# Patient Record
Sex: Male | Born: 1935 | Race: White | Hispanic: No | Marital: Married | Smoking: Never smoker
Health system: Southern US, Community
[De-identification: ages and names within clinical notes are randomized; demographics above are authoritative.]

## PROBLEM LIST (undated history)

## (undated) DIAGNOSIS — T7840XA Allergy, unspecified, initial encounter: Secondary | ICD-10-CM

## (undated) DIAGNOSIS — E785 Hyperlipidemia, unspecified: Secondary | ICD-10-CM

## (undated) DIAGNOSIS — T4145XA Adverse effect of unspecified anesthetic, initial encounter: Secondary | ICD-10-CM

## (undated) DIAGNOSIS — H9190 Unspecified hearing loss, unspecified ear: Secondary | ICD-10-CM

## (undated) DIAGNOSIS — R9431 Abnormal electrocardiogram [ECG] [EKG]: Secondary | ICD-10-CM

## (undated) DIAGNOSIS — K559 Vascular disorder of intestine, unspecified: Secondary | ICD-10-CM

## (undated) DIAGNOSIS — E663 Overweight: Secondary | ICD-10-CM

## (undated) DIAGNOSIS — K219 Gastro-esophageal reflux disease without esophagitis: Secondary | ICD-10-CM

## (undated) DIAGNOSIS — I10 Essential (primary) hypertension: Secondary | ICD-10-CM

## (undated) DIAGNOSIS — T8859XA Other complications of anesthesia, initial encounter: Secondary | ICD-10-CM

## (undated) DIAGNOSIS — K2270 Barrett's esophagus without dysplasia: Secondary | ICD-10-CM

## (undated) DIAGNOSIS — M1990 Unspecified osteoarthritis, unspecified site: Secondary | ICD-10-CM

## (undated) DIAGNOSIS — Z8719 Personal history of other diseases of the digestive system: Secondary | ICD-10-CM

## (undated) DIAGNOSIS — N400 Enlarged prostate without lower urinary tract symptoms: Secondary | ICD-10-CM

## (undated) DIAGNOSIS — G629 Polyneuropathy, unspecified: Secondary | ICD-10-CM

## (undated) HISTORY — PX: EYE SURGERY: SHX253

## (undated) HISTORY — DX: Essential (primary) hypertension: I10

## (undated) HISTORY — DX: Benign prostatic hyperplasia without lower urinary tract symptoms: N40.0

## (undated) HISTORY — PX: FOOT SURGERY: SHX648

## (undated) HISTORY — DX: Allergy, unspecified, initial encounter: T78.40XA

## (undated) HISTORY — PX: APPENDECTOMY: SHX54

## (undated) HISTORY — PX: SEPTOPLASTY: SUR1290

## (undated) HISTORY — PX: OTHER SURGICAL HISTORY: SHX169

## (undated) HISTORY — PX: TONSILLECTOMY: SUR1361

## (undated) HISTORY — PX: INNER EAR SURGERY: SHX679

## (undated) HISTORY — DX: Personal history of other diseases of the digestive system: Z87.19

## (undated) HISTORY — DX: Vascular disorder of intestine, unspecified: K55.9

## (undated) HISTORY — DX: Hyperlipidemia, unspecified: E78.5

## (undated) HISTORY — DX: Polyneuropathy, unspecified: G62.9

## (undated) HISTORY — DX: Unspecified osteoarthritis, unspecified site: M19.90

## (undated) HISTORY — DX: Overweight: E66.3

## (undated) HISTORY — DX: Barrett's esophagus without dysplasia: K22.70

## (undated) HISTORY — DX: Gastro-esophageal reflux disease without esophagitis: K21.9

---

## 1991-05-31 HISTORY — PX: LAPAROSCOPIC CHOLECYSTECTOMY: SUR755

## 2001-06-07 ENCOUNTER — Ambulatory Visit (HOSPITAL_BASED_OUTPATIENT_CLINIC_OR_DEPARTMENT_OTHER): Admission: RE | Admit: 2001-06-07 | Discharge: 2001-06-07 | Payer: Self-pay | Admitting: Orthopedic Surgery

## 2002-04-15 ENCOUNTER — Ambulatory Visit (HOSPITAL_COMMUNITY): Admission: RE | Admit: 2002-04-15 | Discharge: 2002-04-15 | Payer: Self-pay | Admitting: Pulmonary Disease

## 2002-04-15 ENCOUNTER — Encounter: Payer: Self-pay | Admitting: Pulmonary Disease

## 2003-03-15 ENCOUNTER — Emergency Department (HOSPITAL_COMMUNITY): Admission: EM | Admit: 2003-03-15 | Discharge: 2003-03-15 | Payer: Self-pay | Admitting: Emergency Medicine

## 2003-06-09 ENCOUNTER — Inpatient Hospital Stay (HOSPITAL_COMMUNITY): Admission: RE | Admit: 2003-06-09 | Discharge: 2003-06-14 | Payer: Self-pay | Admitting: Orthopedic Surgery

## 2003-07-22 ENCOUNTER — Inpatient Hospital Stay (HOSPITAL_COMMUNITY): Admission: RE | Admit: 2003-07-22 | Discharge: 2003-07-23 | Payer: Self-pay | Admitting: Neurosurgery

## 2003-11-11 ENCOUNTER — Encounter: Admission: RE | Admit: 2003-11-11 | Discharge: 2003-11-11 | Payer: Self-pay | Admitting: Neurosurgery

## 2004-04-15 ENCOUNTER — Ambulatory Visit (HOSPITAL_COMMUNITY): Admission: RE | Admit: 2004-04-15 | Discharge: 2004-04-15 | Payer: Self-pay | Admitting: Neurosurgery

## 2004-06-07 ENCOUNTER — Inpatient Hospital Stay (HOSPITAL_COMMUNITY): Admission: RE | Admit: 2004-06-07 | Discharge: 2004-06-08 | Payer: Self-pay | Admitting: Neurosurgery

## 2004-11-02 ENCOUNTER — Ambulatory Visit: Payer: Self-pay | Admitting: Gastroenterology

## 2004-11-17 ENCOUNTER — Encounter (INDEPENDENT_AMBULATORY_CARE_PROVIDER_SITE_OTHER): Payer: Self-pay | Admitting: *Deleted

## 2004-11-17 ENCOUNTER — Ambulatory Visit: Payer: Self-pay | Admitting: Gastroenterology

## 2004-12-01 ENCOUNTER — Ambulatory Visit: Payer: Self-pay | Admitting: Pulmonary Disease

## 2005-01-24 ENCOUNTER — Ambulatory Visit: Payer: Self-pay | Admitting: Pulmonary Disease

## 2005-02-03 ENCOUNTER — Ambulatory Visit: Payer: Self-pay

## 2005-04-27 ENCOUNTER — Ambulatory Visit: Payer: Self-pay | Admitting: Pulmonary Disease

## 2005-07-14 ENCOUNTER — Ambulatory Visit: Payer: Self-pay | Admitting: Pulmonary Disease

## 2006-01-13 ENCOUNTER — Ambulatory Visit: Payer: Self-pay | Admitting: Pulmonary Disease

## 2006-01-27 ENCOUNTER — Ambulatory Visit: Payer: Self-pay | Admitting: Pulmonary Disease

## 2006-03-28 ENCOUNTER — Ambulatory Visit: Payer: Self-pay | Admitting: Pulmonary Disease

## 2006-07-14 ENCOUNTER — Ambulatory Visit: Payer: Self-pay | Admitting: Pulmonary Disease

## 2006-07-26 ENCOUNTER — Inpatient Hospital Stay (HOSPITAL_COMMUNITY): Admission: RE | Admit: 2006-07-26 | Discharge: 2006-07-29 | Payer: Self-pay | Admitting: Orthopedic Surgery

## 2006-10-20 ENCOUNTER — Ambulatory Visit: Payer: Self-pay | Admitting: Gastroenterology

## 2006-10-29 ENCOUNTER — Inpatient Hospital Stay (HOSPITAL_COMMUNITY): Admission: EM | Admit: 2006-10-29 | Discharge: 2006-11-01 | Payer: Self-pay | Admitting: Emergency Medicine

## 2006-10-29 ENCOUNTER — Ambulatory Visit: Payer: Self-pay | Admitting: Internal Medicine

## 2006-11-01 ENCOUNTER — Ambulatory Visit: Payer: Self-pay | Admitting: Internal Medicine

## 2006-11-21 ENCOUNTER — Ambulatory Visit: Payer: Self-pay | Admitting: Gastroenterology

## 2006-11-28 LAB — CONVERTED CEMR LAB
Basophils Absolute: 0.1 10*3/uL (ref 0.0–0.1)
Basophils Relative: 0.7 % (ref 0.0–1.0)
HCT: 38.7 % — ABNORMAL LOW (ref 39.0–52.0)
Hemoglobin: 13.3 g/dL (ref 13.0–17.0)
Iron: 65 ug/dL (ref 42–165)
MCHC: 34.4 g/dL (ref 30.0–36.0)
MCV: 83.8 fL (ref 78.0–100.0)
Monocytes Relative: 7.7 % (ref 3.0–11.0)
Neutro Abs: 5.3 10*3/uL (ref 1.4–7.7)
Platelets: 196 10*3/uL (ref 150–400)
RDW: 13.7 % (ref 11.5–14.6)
Transferrin: 236.8 mg/dL (ref >=212.0)
WBC: 8.6 10*3/uL (ref 4.5–10.5)

## 2006-12-21 ENCOUNTER — Encounter: Payer: Self-pay | Admitting: Internal Medicine

## 2006-12-21 ENCOUNTER — Ambulatory Visit: Payer: Self-pay | Admitting: Internal Medicine

## 2007-01-11 ENCOUNTER — Ambulatory Visit: Payer: Self-pay | Admitting: Pulmonary Disease

## 2007-03-27 ENCOUNTER — Ambulatory Visit: Payer: Self-pay | Admitting: Pulmonary Disease

## 2007-05-07 DIAGNOSIS — F411 Generalized anxiety disorder: Secondary | ICD-10-CM | POA: Insufficient documentation

## 2007-05-07 DIAGNOSIS — K2270 Barrett's esophagus without dysplasia: Secondary | ICD-10-CM | POA: Insufficient documentation

## 2007-05-07 DIAGNOSIS — K219 Gastro-esophageal reflux disease without esophagitis: Secondary | ICD-10-CM | POA: Insufficient documentation

## 2007-05-07 DIAGNOSIS — G589 Mononeuropathy, unspecified: Secondary | ICD-10-CM | POA: Insufficient documentation

## 2007-05-07 DIAGNOSIS — J309 Allergic rhinitis, unspecified: Secondary | ICD-10-CM | POA: Insufficient documentation

## 2007-05-07 DIAGNOSIS — I10 Essential (primary) hypertension: Secondary | ICD-10-CM | POA: Insufficient documentation

## 2007-05-07 DIAGNOSIS — E785 Hyperlipidemia, unspecified: Secondary | ICD-10-CM | POA: Insufficient documentation

## 2007-05-07 DIAGNOSIS — M1990 Unspecified osteoarthritis, unspecified site: Secondary | ICD-10-CM | POA: Insufficient documentation

## 2007-05-08 ENCOUNTER — Ambulatory Visit: Payer: Self-pay | Admitting: Pulmonary Disease

## 2007-05-08 ENCOUNTER — Telehealth (INDEPENDENT_AMBULATORY_CARE_PROVIDER_SITE_OTHER): Payer: Self-pay | Admitting: *Deleted

## 2007-07-01 HISTORY — PX: OTHER SURGICAL HISTORY: SHX169

## 2007-07-12 ENCOUNTER — Ambulatory Visit: Payer: Self-pay | Admitting: Pulmonary Disease

## 2007-07-12 DIAGNOSIS — K559 Vascular disorder of intestine, unspecified: Secondary | ICD-10-CM | POA: Insufficient documentation

## 2007-07-14 DIAGNOSIS — N400 Enlarged prostate without lower urinary tract symptoms: Secondary | ICD-10-CM | POA: Insufficient documentation

## 2007-07-17 ENCOUNTER — Encounter: Admission: RE | Admit: 2007-07-17 | Discharge: 2007-07-17 | Payer: Self-pay | Admitting: Otolaryngology

## 2007-09-25 DIAGNOSIS — K449 Diaphragmatic hernia without obstruction or gangrene: Secondary | ICD-10-CM | POA: Insufficient documentation

## 2007-10-26 ENCOUNTER — Telehealth: Payer: Self-pay | Admitting: Pulmonary Disease

## 2007-11-09 ENCOUNTER — Encounter: Payer: Self-pay | Admitting: Pulmonary Disease

## 2008-01-04 ENCOUNTER — Ambulatory Visit: Payer: Self-pay | Admitting: Pulmonary Disease

## 2008-01-05 LAB — CONVERTED CEMR LAB
ALT: 27 units/L (ref 0–53)
AST: 25 units/L (ref 0–37)
BUN: 11 mg/dL (ref 6–23)
Basophils Relative: 0.4 % (ref 0.0–3.0)
CO2: 29 meq/L (ref 19–32)
Calcium: 9 mg/dL (ref 8.4–10.5)
Chloride: 111 meq/L (ref 96–112)
Cholesterol: 179 mg/dL (ref 0–200)
Creatinine, Ser: 1 mg/dL (ref 0.4–1.5)
Eosinophils Relative: 4.5 % (ref 0.0–5.0)
Glucose, Bld: 103 mg/dL — ABNORMAL HIGH (ref 70–99)
Hemoglobin: 14.6 g/dL (ref 13.0–17.0)
LDL Cholesterol: 122 mg/dL — ABNORMAL HIGH (ref 0–99)
Lymphocytes Relative: 33.4 % (ref 12.0–46.0)
MCHC: 34.3 g/dL (ref 30.0–36.0)
Monocytes Relative: 7.8 % (ref 3.0–12.0)
Neutro Abs: 3.6 10*3/uL (ref 1.4–7.7)
PSA: 2.04 ng/mL (ref 0.10–4.00)
RBC: 4.84 M/uL (ref 4.22–5.81)
TSH: 1.92 microintl units/mL (ref 0.35–5.50)
Total CHOL/HDL Ratio: 4.7
Total Protein: 6.9 g/dL (ref 6.0–8.3)
WBC: 6.6 10*3/uL (ref 4.5–10.5)

## 2008-02-27 ENCOUNTER — Ambulatory Visit: Payer: Self-pay | Admitting: Pulmonary Disease

## 2008-07-03 ENCOUNTER — Ambulatory Visit: Payer: Self-pay | Admitting: Pulmonary Disease

## 2008-07-05 DIAGNOSIS — E663 Overweight: Secondary | ICD-10-CM | POA: Insufficient documentation

## 2008-11-11 ENCOUNTER — Telehealth (INDEPENDENT_AMBULATORY_CARE_PROVIDER_SITE_OTHER): Payer: Self-pay | Admitting: *Deleted

## 2008-11-11 ENCOUNTER — Ambulatory Visit: Payer: Self-pay | Admitting: Internal Medicine

## 2008-11-18 DIAGNOSIS — L255 Unspecified contact dermatitis due to plants, except food: Secondary | ICD-10-CM | POA: Insufficient documentation

## 2008-12-11 ENCOUNTER — Encounter (INDEPENDENT_AMBULATORY_CARE_PROVIDER_SITE_OTHER): Payer: Self-pay | Admitting: *Deleted

## 2008-12-16 ENCOUNTER — Ambulatory Visit: Payer: Self-pay | Admitting: Gastroenterology

## 2008-12-30 ENCOUNTER — Encounter: Payer: Self-pay | Admitting: Gastroenterology

## 2008-12-30 ENCOUNTER — Ambulatory Visit: Payer: Self-pay | Admitting: Gastroenterology

## 2009-01-01 ENCOUNTER — Encounter: Payer: Self-pay | Admitting: Gastroenterology

## 2009-01-01 ENCOUNTER — Ambulatory Visit: Payer: Self-pay | Admitting: Pulmonary Disease

## 2009-01-07 ENCOUNTER — Ambulatory Visit: Payer: Self-pay | Admitting: Pulmonary Disease

## 2009-01-22 LAB — CONVERTED CEMR LAB
AST: 25 units/L (ref 0–37)
Albumin: 4.2 g/dL (ref 3.5–5.2)
Alkaline Phosphatase: 57 units/L (ref 39–117)
Basophils Absolute: 0.1 10*3/uL (ref 0.0–0.1)
Bilirubin, Direct: 0.1 mg/dL (ref 0.0–0.3)
CO2: 28 meq/L (ref 19–32)
Calcium: 9 mg/dL (ref 8.4–10.5)
Eosinophils Absolute: 0.4 10*3/uL (ref 0.0–0.7)
GFR calc non Af Amer: 78.12 mL/min (ref >60)
Glucose, Bld: 97 mg/dL (ref 70–99)
HCT: 42.4 % (ref 39.0–52.0)
Hemoglobin: 14.6 g/dL (ref 13.0–17.0)
Lymphs Abs: 2.6 10*3/uL (ref 0.7–4.0)
MCHC: 34.6 g/dL (ref 30.0–36.0)
Monocytes Absolute: 0.6 10*3/uL (ref 0.1–1.0)
Monocytes Relative: 8.1 % (ref 3.0–12.0)
Neutro Abs: 4.1 10*3/uL (ref 1.4–7.7)
Platelets: 177 10*3/uL (ref 150.0–400.0)
Potassium: 4.1 meq/L (ref 3.5–5.1)
RDW: 13.1 % (ref 11.5–14.6)
Sodium: 142 meq/L (ref 135–145)
TSH: 2.3 microintl units/mL (ref 0.35–5.50)
Total CHOL/HDL Ratio: 5
Total Protein: 7 g/dL (ref 6.0–8.3)

## 2009-03-13 ENCOUNTER — Ambulatory Visit: Payer: Self-pay | Admitting: Pulmonary Disease

## 2009-06-22 ENCOUNTER — Ambulatory Visit: Payer: Self-pay | Admitting: Pulmonary Disease

## 2009-11-27 ENCOUNTER — Ambulatory Visit: Payer: Self-pay | Admitting: Pulmonary Disease

## 2009-11-28 LAB — CONVERTED CEMR LAB
AST: 22 units/L (ref 0–37)
Albumin: 4.5 g/dL (ref 3.5–5.2)
BUN: 17 mg/dL (ref 6–23)
Basophils Relative: 0.6 % (ref 0.0–3.0)
Calcium: 9 mg/dL (ref 8.4–10.5)
Cholesterol: 198 mg/dL (ref 0–200)
Creatinine, Ser: 0.9 mg/dL (ref 0.4–1.5)
Eosinophils Relative: 3.9 % (ref 0.0–5.0)
GFR calc non Af Amer: 84.74 mL/min (ref >60)
Glucose, Bld: 88 mg/dL (ref 70–99)
HCT: 42.4 % (ref 39.0–52.0)
Hemoglobin: 14.7 g/dL (ref 13.0–17.0)
LDL Cholesterol: 125 mg/dL — ABNORMAL HIGH (ref 0–99)
Lymphs Abs: 2.7 10*3/uL (ref 0.7–4.0)
MCV: 88.6 fL (ref 78.0–100.0)
Monocytes Relative: 8.5 % (ref 3.0–12.0)
PSA: 2.76 ng/mL (ref 0.10–4.00)
Platelets: 195 10*3/uL (ref 150.0–400.0)
RBC: 4.78 M/uL (ref 4.22–5.81)
Total Bilirubin: 0.7 mg/dL (ref 0.3–1.2)
Total CHOL/HDL Ratio: 5
VLDL: 31.8 mg/dL (ref 0.0–40.0)
WBC: 9.2 10*3/uL (ref 4.5–10.5)

## 2010-02-22 ENCOUNTER — Telehealth: Payer: Self-pay | Admitting: Pulmonary Disease

## 2010-02-22 ENCOUNTER — Encounter: Payer: Self-pay | Admitting: Pulmonary Disease

## 2010-02-23 ENCOUNTER — Telehealth: Payer: Self-pay | Admitting: Pulmonary Disease

## 2010-02-23 ENCOUNTER — Encounter: Payer: Self-pay | Admitting: Pulmonary Disease

## 2010-03-01 ENCOUNTER — Telehealth (INDEPENDENT_AMBULATORY_CARE_PROVIDER_SITE_OTHER): Payer: Self-pay | Admitting: *Deleted

## 2010-03-16 ENCOUNTER — Encounter: Payer: Self-pay | Admitting: Pulmonary Disease

## 2010-05-26 ENCOUNTER — Ambulatory Visit: Payer: Self-pay | Admitting: Pulmonary Disease

## 2010-05-30 HISTORY — PX: OTHER SURGICAL HISTORY: SHX169

## 2010-07-01 NOTE — Progress Notes (Signed)
Summary: cough-----rx for Tussionex  Phone Note Call from Patient Call back at Home Phone [phone]   Caller: Spouse//clara Call For: nadel Summary of Call: States pt has a hacking cough, would like something called in.//[facility] Initial call taken by: Darletta Moll,  March 01, 2010 4:20 PM  Follow-up for Phone Call        Called, spoke with pt.  He c/o "hacking cough" x 3 wks.  Cough is mainly when he gets hot.  Using cough drops with slight relief.  Requesting rx for tussinex cough syrup -- states this has worked well in the past.   [facility] Allergies: Flomax Dr. Kriste Basque, pls advise.  Thanks! Gweneth Dimitri RN  March 01, 2010 4:36 PM   Additional Follow-up for Phone Call Additional follow up Details #1::        lmomtcb for pt---tussionex has already been called into the pharmacy ---just wanted to let the pt know Randell Loop [facility]  March 01, 2010 5:24 PM     Additional Follow-up for Phone Call Additional follow up Details #2::    called and spoke with pt.  pt aware rx sent to pharmacy. Marland KitchenArman Filter LPN  March 02, 2010 9:08 AM   New/Updated Medications: Sandria Senter ER 10-8 MG/5ML LQCR (HYDROCOD POLST-CHLORPHEN POLST) take one teaspoon by mouth every 12 hours as needed for cough Prescriptions: TUSSIONEX PENNKINETIC ER 10-8 MG/5ML LQCR (HYDROCOD POLST-CHLORPHEN POLST) take one teaspoon by mouth every 12 hours as needed for cough  #4 oz x 0   Entered by:   Randell Loop CMA   Authorized by:   Michele Mcalpine MD   Signed by:   Arman Filter LPN on 09/81/1914   Method used:   Telephoned to ...       [facility] [address] (retail)       [address]       [place], [place]  [address]       Ph: [phone]       Fax: [phone]   RxID:   [id number]

## 2010-07-01 NOTE — Op Note (Signed)
Summary: Right Foot / [facility]  Right Foot / [facility]   Imported By: Lennie Odor 03/25/2010 12:07:18  _____________________________________________________________________  External Attachment:    Type:   Image     Comment:   External Document

## 2010-07-01 NOTE — Assessment & Plan Note (Signed)
Summary: 6 months/acp   CC:  6 month ROV & review of mult medical problems....  History of Present Illness: 76 y/o WM here for a follow up visit...  he has multiple medical problems as noted below...   Followed for general medical purposes w/ hx HBP, Hyperlipidemia, Barrett's esoph, ischemic colitis in 2008, BPH, and DJD...   ~  June 22, 2009:  he had a good 50mo- just c/o some burning pain in left groin area... ?loose ing ring/ ing hernia but not bad enough for surg he says- try Lyrica 50-100 Qhs first... needs refill Rx for 2011 & due for TDAP today.   ~  November 27, 2009:  he's been doing well x left foot> surg x2 by Podiatrist for bunion, & toe shortening... we discussed poss 2nd opinion w/ Orthopedist- DrBednarz, he will decide... otherw feeling well- no new complaints or concerns...    Current Problem List:  ALLERGIC RHINITIS (ICD-477.9) - DrESL referred pt to [facility] for nasal polyps... on PATANASE & allergy shots weekly...  HYPERTENSION (ICD-401.9) - controlled on ASA 81mg /d, & DOXAZOSIN 8mg /d... BP=112/84 and he feels well... doesn't check BP at home but it's OK at the drug store... denies HA, fatigue, visual changes, CP, palipit, dizziness, syncope, dyspnea, edema, etc...   ~  he had a negative Cardiolite in 2001 and normal CDoppler's in 2006.  HYPERLIPIDEMIA (ICD-272.4) - on diet alone... he has repeatedly refused Statin Rx.  ~  FLP 8/07 showed TChol 189, TG 200, HDL 32, LDL 117... rec- med Rx, but he preferred diet.  ~  FLP 8/09 showed TChol 179, TG 94, HDL 38, LDL 122... rec- consider meds, he refuses.  ~  FLP 8/10 showed TChol 186, TG 140, HDL 37, LDL 121... better diet, get wt down.  ~  FLP 7/11 showed TChol 198, TG 159, HDL 41, LDL 125... he continues to decline statin Rx.  OVERWEIGHT (ICD-278.02) - we discussed diet + exercise program sufficient to lose weight.  ~  weight 2/10 = 239#   ~  weight 8/10 = 244#  ~  weight 1/11 = 238#  ~  weight 7/11 = 240#  GERD  (ICD-530.81) & BARRETTS ESOPHAGUS (ICD-530.85) - followed by DrPatterson/Brodie... on NEXIUM 40mg /d...  ~  EGD 7/08 showed HH and Barrett's mucosa (no dysplasia)...  ~  EGD 8/10 showed sm hyperplastic polyps, & 5cm segment of Barrett's mucosa... f/u planned 34yrs.  Hx of ISCHEMIC COLITIS (ICD-557.9) - Hosp 5/08 w/ abd pain & lower GIB.Marland Kitchen. eval revealed prob ischemic colitis... he had a f/u colonoscopy 7/08 which was normal without colitis... no recurrent symptoms to date...  BENIGN PROSTATIC HYPERTROPHY, HX OF (ICD-V13.8) - hx BPH and UTI in the past... DOXAZOSIN helps... INTOL Flomax "it affects my eyes"; added UROXATROL 10mg /d 8/10 and improved nocturia & LTOS...  ~  labs 8/07 showed PSA= 2.6  ~  labs 8/09 showed PSA= 2.04  ~  labs 8/10 showed PSA= 2.54  ~  labs 7/11 showed PSA= 2.76  DEGENERATIVE JOINT DISEASE (ICD-715.90) - S/P Right TKR 2/08 & Left TKR 2/09 DrAlusio... & 2 prev neck surg by DrHirsh... he notes occas LBP as well...  ~  7/11: he reports 2 operations left foot by podiatrist for bunion & toe straightening.  NEUROPATHY (ICD-355.9) - prev tried Lyrica- ?benefit...  ANXIETY (ICD-300.00) - he is under mod stress and requests ALPRAZOLAM 0.5mg - 1/2 to 1 tab Tid Prn...  Health Maintenance:  had 2010 Flu vaccine 10/10... given TDAP 1/11.Marland KitchenMarland Kitchen  Preventive Screening-Counseling & Management  Alcohol-Tobacco     Smoking Status: never  Allergies: 1)  ! Flomax (Tamsulosin Hcl)  Comments:  Nurse/Medical Assistant: The patient's medications and allergies were reviewed with the patient and were updated in the Medication and Allergy Lists.  Past History:  Past Medical History: ALLERGIC RHINITIS (ICD-477.9) HYPERTENSION (ICD-401.9) HYPERLIPIDEMIA (ICD-272.4) OVERWEIGHT (ICD-278.02) GERD (ICD-530.81) BARRETTS ESOPHAGUS (ICD-530.85) Hx of ISCHEMIC COLITIS (ICD-557.9) BENIGN PROSTATIC HYPERTROPHY, HX OF (ICD-V13.8) DEGENERATIVE JOINT DISEASE (ICD-715.90) NEUROPATHY  (ICD-355.9) ANXIETY (ICD-300.00)  Past Surgical History: S/P Lap Chole 1993 by DrPYoung S/P 2 Neck Surgeries by DrHirsh in 2005-6 w/ ant cx decompression, diskectomy, fusion, & C6-7 allograft S/P Right TKR 2/08 by DrAlusio S/P Left TKR 2/09 by DrAlusio S/P appendectomy  Family History: Reviewed history from 01/04/2008 and no changes required. mother deceased age 88 from leukemia father deceased age 39 from heart attack 1 sibling alive age 76  Social History: Reviewed history from 01/04/2008 and no changes required. Patient never smoked.  exposed to second hand smoke limited exercise drinks caffeine--2 cups per day married 1 child  Review of Systems      See HPI  The patient denies anorexia, fever, weight loss, weight gain, vision loss, decreased hearing, hoarseness, chest pain, syncope, dyspnea on exertion, peripheral edema, prolonged cough, headaches, hemoptysis, abdominal pain, melena, hematochezia, severe indigestion/heartburn, hematuria, incontinence, muscle weakness, suspicious skin lesions, transient blindness, difficulty walking, depression, unusual weight change, abnormal bleeding, enlarged lymph nodes, and angioedema.    Vital Signs:  Patient profile:   76 year old male Height:      71.5 inches Weight:      240 pounds BMI:     33.13 O2 Sat:      96 % on Room air Temp:     97.5 degrees F oral Pulse rate:   64 / minute BP sitting:   112 / 84  (left arm) Cuff size:   regular  Vitals Entered By: Randell Loop CMA (November 27, 2009 8:51 AM)  O2 Sat at Rest %:  96 O2 Flow:  Room air CC: 6 month ROV & review of mult medical problems... Is Patient Diabetic? No Pain Assessment Patient in pain? yes      Onset of pain  some in his right foot after surgery Comments meds updated today with pt   Physical Exam  Additional Exam:  WD, Overweight, 76 y/o WM in NAD... GENERAL:  Alert & oriented; pleasant & cooperative... HEENT:  /AT, EACs-clear, TMs-wnl, NOSE-clear,  THROAT-clear & wnl. NECK:  Supple w/ fairROM; no JVD; normal carotid impulses w/o bruits; no thyromegaly or nodules palpated; no lymphadenopathy. CHEST:  Clear to P & A; without wheezes/ rales/ or rhonchi. HEART:  Regular Rhythm; without murmurs/ rubs/ or gallops. ABDOMEN:  Soft & nontender, w/ diastasis recti, normal bowel sounds; no organomegaly or masses detected. Groin- ?sm hernia on right, min tender, otherw neg. EXT: without deformities, mod arthritic changes; no varicose veins/ venous insuffic/ or edema. NEURO:  CN's intact; no focal neuro deficits... DERM:  no lesions noted...    CXR  Procedure date:  11/27/2009  Findings:      CHEST - 2 VIEW Comparison: January 04, 2008   Findings: Mild cardiomegaly is stable.  The mediastinum and pulmonary vasculature are within normal limits.  Both lungs are clear.  There is ankylosing degenerative change of the axial spine and postsurgical change at the cervicothoracic junction.   IMPRESSION: Stable chest x-ray with no evidence of acute cardiac or pulmonary  process.   Read By:  Dorthula Nettles,  M.D.   MISC. Report  Procedure date:  11/27/2009  Findings:      BMP (METABOL)   Sodium                    143 mEq/L                   135-145   Potassium                 4.4 mEq/L                   3.5-5.1   Chloride                  108 mEq/L                   96-112   Carbon Dioxide            28 mEq/L                    19-32   Glucose                   88 mg/dL                    98-11   BUN                       17 mg/dL                    9-14   Creatinine                0.9 mg/dL                   7.8-2.9   Calcium                   9.0 mg/dL                   5.6-21.3   GFR                       84.74 mL/min                >60  Hepatic/Liver Function Panel (HEPATIC)   Total Bilirubin           0.7 mg/dL                   0.8-6.5   Direct Bilirubin          0.1 mg/dL                   7.8-4.6   Alkaline Phosphatase      59  U/L                      39-117   AST                       22 U/L                      0-37   ALT                       21 U/L  0-53   Total Protein             7.5 g/dL                    8.2-9.5   Albumin                   4.5 g/dL                    6.2-1.3  CBC Platelet w/Diff (CBCD)   White Cell Count          9.2 K/uL                    4.5-10.5   Red Cell Count            4.78 Mil/uL                 4.22-5.81   Hemoglobin                14.7 g/dL                   08.6-57.8   Hematocrit                42.4 %                      39.0-52.0   MCV                       88.6 fl                     78.0-100.0   Platelet Count            195.0 K/uL                  150.0-400.0   Neutrophil %              57.3 %                      43.0-77.0   Lymphocyte %              29.7 %                      12.0-46.0   Monocyte %                8.5 %                       3.0-12.0   Eosinophils%              3.9 %                       0.0-5.0   Basophils %               0.6 %                       0.0-3.0  Comments:      Lipid Panel (LIPID)   Cholesterol               198 mg/dL                   4-696   Triglycerides        [H]  159.0 mg/dL  0.0-149.0   HDL                       04.54 mg/dL                 >09.81   LDL Cholesterol      [H]  191 mg/dL                   4-78  TSH (TSH)   FastTSH                   2.87 uIU/mL                 0.35-5.50  Prostate Specific Antigen (PSA)   PSA-Hyb                   2.76 ng/mL                  0.10-4.00   Impression & Recommendations:  Problem # 1:  HYPERTENSION (ICD-401.9) Controlled-  continue same meds. His updated medication list for this problem includes:    Doxazosin Mesylate 8 Mg Tabs (Doxazosin mesylate) .Marland Kitchen... Take 1 tablet by mouth once a day  Orders: T-2 View CXR (71020TC) TLB-BMP (Basic Metabolic Panel-BMET) (80048-METABOL) TLB-Hepatic/Liver Function Pnl (80076-HEPATIC) TLB-CBC Platelet -  w/Differential (85025-CBCD) TLB-Lipid Panel (80061-LIPID) TLB-TSH (Thyroid Stimulating Hormone) (84443-TSH) TLB-PSA (Prostate Specific Antigen) (84153-PSA)  Problem # 2:  HYPERLIPIDEMIA (ICD-272.4) Needs better diet, must get weight down, refuses med Rx> therefore diet + exercise.  Problem # 3:  OVERWEIGHT (ICD-278.02) Diet + exercise is the key...  Problem # 4:  BARRETTS ESOPHAGUS (ICD-530.85) Followed by DrPatterson et al... f/u EGD due 8/13...  Problem # 5:  BENIGN PROSTATIC HYPERTROPHY, HX OF (ICD-V13.8) His PSA is normal> symptoms improved w/ Doxazosin & Uroxatrol...  Problem # 6:  DEGENERATIVE JOINT DISEASE (ICD-715.90) he has bilat TKR's.. recent foot surg by podiatry... His updated medication list for this problem includes:    Bayer Low Strength 81 Mg Tbec (Aspirin) .Marland Kitchen... Take 1 tablet by mouth once a day  Complete Medication List: 1)  Allergy Shot  .... Take once a week 2)  Nasacort Aq 55 Mcg/act Aers (Triamcinolone acetonide(nasal)) .... Use 2 sprrays in each nostril once daily 3)  Bayer Low Strength 81 Mg Tbec (Aspirin) .... Take 1 tablet by mouth once a day 4)  Doxazosin Mesylate 8 Mg Tabs (Doxazosin mesylate) .... Take 1 tablet by mouth once a day 5)  Nexium 40 Mg Cpdr (Esomeprazole magnesium) .... Take one by mouth once daily 6)  Uroxatral 10 Mg Xr24h-tab (Alfuzosin hcl) .... Take one tab by mouth at bedtime.Marland KitchenMarland Kitchen 7)  Alprazolam 0.5 Mg Tabs (Alprazolam) .... Take 1/2 to 1 tab by mouth three times a day as needed for nerves... 8)  Centrum Tabs (Multiple vitamins-minerals) .... Take 1 tablet by mouth once a day 9)  Vitamin D 1000 Unit Tabs (Cholecalciferol) .... Take 1 tablet by mouth once a day 10)  Lysine 500 Mg Caps (Lysine) .... Take one by mouth once daily 11)  Zinc 50 Mg Tabs (Zinc) .... Take one by mouth once daily 12)  Fluocinolone Acetonide 0.01 % Soln (Fluocinolone acetonide) .... Apply as directed...  Patient Instructions: 1)  Today we updated your med list-  see below.... 2)  Continue your current meds the same... 3)  Today we did your follow up CXR & FASTING lab work... please call the "phone tree" in a few days  for your lab results.Marland KitchenMarland Kitchen 4)  If you need a second opinion about your foot> I rec Dr. Leonides Grills at [facility].Marland KitchenMarland Kitchen 5)  Call for any problems.Marland KitchenMarland Kitchen 6)  Please schedule a follow-up appointment in 6 months.

## 2010-07-01 NOTE — Assessment & Plan Note (Signed)
Summary: rov/jd   CC:  6 month ROV & review of mult medical issues....  History of Present Illness: 76 y/o WM here for a follow up visit...  he has multiple medical problems as noted below...     ~  January 01, 2009:  he's had a good 6 months overall- one bout poison ivy 6/10 treated by TP & resolved... he also had f/u EGD 12/30/08 by DrPatterson w/ few sm hyperplastic polyps and 5cm seg of Barrett's... rec to continue Nexium Rx and f/u 45yrs... Chol Rx w/ diet alone & needs to lose wt.   ~  June 22, 2009:  he had a good 29mo- just c/o some burning pain in left groin area... ?loose ing ring/ ing hernia but not bad enough for surg he says- try Lyrica 50-100 Qhs first... needs refill Rx for 2011 & due for TDAP today.    Current Problem List:  ALLERGIC RHINITIS (ICD-477.9) - DrESL referred pt to [facility] for nasal polyps... on PATANASE & allergy shots weekly...  HYPERTENSION (ICD-401.9) - controlled on ASA 81mg /d, & DOXAZOSIN 8mg /d... BP=132/80 and he feels well... doesn't check BP at home but it's OK at the drug store... denies HA, fatigue, visual changes, CP, palipit, dizziness, syncope, dyspnea, edema, etc...   ~  he had a negative Cardiolite in 2001 and normal CDoppler's in 2006.  HYPERLIPIDEMIA (ICD-272.4) - on diet alone... he has repeatedly refused Statin Rx...  ~  FLP 8/07 showed TChol 189, TG 200, HDL 32, LDL 117... rec- med Rx, but he preferred diet.  ~  FLP 8/09 showed TChol 179, TG 94, HDL 38, LDL 122... rec- consider meds, he refuses...  ~  FLP 8/10 showed TChol 186, TG 140, HDL 37, LDL 121... better diet, get wt down.  OVERWEIGHT (ICD-278.02) - we discussed diet + exercise program sufficient to lose weight.  ~  weight 2/10 = 239#   ~  weight 8/10 = 244#  ~  weight 1/11 = 238#  GERD (ICD-530.81) & BARRETTS ESOPHAGUS (ICD-530.85) - followed by DrPatterson/Brodie... on NEXIUM 40mg /d...  ~  EGD 7/08 showed HH and Barrett's mucosa (no dysplasia)...  ~  EGD 8/10 showed sm  hyperplastic polyps, & 5cm segment of Barrett's mucosa...  Hx of ISCHEMIC COLITIS (ICD-557.9) - Hosp 5/08 w/ abd pain & lower GIB.Marland Kitchen. eval revealed prob ischemic colitis... he had a f/u colonoscopy 7/08 which was normal without colitis...  BENIGN PROSTATIC HYPERTROPHY, HX OF (ICD-V13.8) - hx BPH and UTI in the past... DOXAZOSIN helps... INTOL Flomax "it affects my eyes"; added UROXATROL 10mg /d 8/10 and improved nocturia & LTOS...  ~  labs 8/07 showed PSA= 2.6  ~  labs 8/09 showed PSA= 2.04  ~  labs 8/10 showed PSA= 2.54  DEGENERATIVE JOINT DISEASE (ICD-715.90) - S/P Right TKR 2/08 & Left TKR 2/09 DrAlusio... & 2 prev neck surg by DrHirsh... he notes occas LBP as well...  NEUROPATHY (ICD-355.9)  ANXIETY (ICD-300.00) - he is under mod stress and requests ALPRAZOLAM 0.5mg - 1/2 to 1 tab Tid Prn...  Health Maintenance:  had 2010 Flu vaccine 10/10... will give TDAP 1/11...  Allergies: 1)  ! Flomax (Tamsulosin Hcl)  Comments:  Nurse/Medical Assistant: The patient's medications and allergies were reviewed with the patient and were updated in the Medication and Allergy Lists.  Past History:  Past Medical History:  ALLERGIC RHINITIS (ICD-477.9) HYPERTENSION (ICD-401.9) HYPERLIPIDEMIA (ICD-272.4) OVERWEIGHT (ICD-278.02) GERD (ICD-530.81) BARRETTS ESOPHAGUS (ICD-530.85) Hx of ISCHEMIC COLITIS (ICD-557.9) BENIGN PROSTATIC HYPERTROPHY, HX OF (ICD-V13.8) DEGENERATIVE JOINT  DISEASE (ICD-715.90) NEUROPATHY (ICD-355.9) ANXIETY (ICD-300.00)  Past Surgical History: S/P Lap Chole 1993 by DrPYoung S/P 2 Neck Surgeries by DrHirsh in 2005-6 w/ ant cx decompression, diskectomy, fusion, & C6-7 allograft S/P Right TKR 2/08 by DrAlusio S/P Left TKR 2/09 by DrAlusio S/P appendectomy  Family History: Reviewed history from 01/04/2008 and no changes required. mother deceased age 58 from leukemia father deceased age 31 from heart attack 1 sibling alive age 75  Social History: Reviewed history  from 01/04/2008 and no changes required. Patient never smoked.  exposed to second hand smoke limited exercise drinks caffeine--2 cups per day married 1 child  Review of Systems      See HPI  The patient denies anorexia, fever, weight loss, weight gain, vision loss, decreased hearing, hoarseness, chest pain, syncope, dyspnea on exertion, peripheral edema, prolonged cough, headaches, hemoptysis, abdominal pain, melena, hematochezia, severe indigestion/heartburn, hematuria, incontinence, muscle weakness, suspicious skin lesions, transient blindness, difficulty walking, depression, unusual weight change, abnormal bleeding, enlarged lymph nodes, and angioedema.    Vital Signs:  Patient profile:   76 year old male Height:      71.5 inches Weight:      237.50 pounds O2 Sat:      96 % on Room air Temp:     98.5 degrees F oral Pulse rate:   60 / minute BP sitting:   132 / 80  (right arm) Cuff size:   regular  Vitals Entered By: Randell Loop CMA (June 22, 2009 12:03 PM)  O2 Sat at Rest %:  96 O2 Flow:  Room air CC: 6 month ROV & review of mult medical issues... Pain Assessment Patient in pain? yes      Comments no changes in meds today   Physical Exam  Additional Exam:  WD, Overweight, 76 y/o WM in NAD... GENERAL:  Alert & oriented; pleasant & cooperative... HEENT:  [place]/AT, EACs-clear, TMs-wnl, NOSE-clear, THROAT-clear & wnl. NECK:  Supple w/ fairROM; no JVD; normal carotid impulses w/o bruits; no thyromegaly or nodules palpated; no lymphadenopathy. CHEST:  Clear to P & A; without wheezes/ rales/ or rhonchi. HEART:  Regular Rhythm; without murmurs/ rubs/ or gallops. ABDOMEN:  Soft & nontender, w/ diastasis recti, normal bowel sounds; no organomegaly or masses detected. Groin- ?sm hernia on right, min tender, otherw neg. EXT: without deformities, mod arthritic changes; no varicose veins/ venous insuffic/ or edema. NEURO:  CN's intact; no focal neuro deficits... DERM:  no  lesions noted...     Impression & Recommendations:  Problem # 1:  R/O RIGHT INGUINAL HERNIA>>> Offered refer to [facility] for eval, but he prefers to hold-off... wants to try [address] [facility] for pain (50 up to 100mg ) at bedtime... Next step>> send pt to surgeon for their opinon...  Problem # 2:  ALLERGIC RHINITIS (ICD-477.9) Stable- symptoms improved in the winter... His updated medication list for this problem includes:    Nasacort Aq 55 Mcg/act Aers (Triamcinolone acetonide(nasal)) ..... Use 2 sprrays in each nostril once daily  Problem # 3:  HYPERTENSION (ICD-401.9) Controlled-  same meds. His updated medication list for this problem includes:    Doxazosin Mesylate 8 Mg Tabs (Doxazosin mesylate) .Marland Kitchen... Take 1 tablet by mouth once a day  Problem # 4:  HYPERLIPIDEMIA (ICD-272.4) On diet alone-  we discussed the need to lose weight.  Problem # 5:  GERD (ICD-530.81) Continue Nexium & follow up w/ GI... His updated medication list for this problem includes:    Nexium 40 Mg Cpdr (Esomeprazole magnesium) .Marland Kitchen... Take  one by mouth once daily  Problem # 6:  BENIGN PROSTATIC HYPERTROPHY, HX OF (ICD-V13.8) Symptoms improved on the Doxazosin + Uroxatrol...  Problem # 7:  NEUROPATHY (ICD-355.9) Wants to try the Lyrica 50 for the groin pain...  Problem # 8:  OTHER MEDICAL PROBLEMS AS NOTED>>> Due for TDAP vaccination today...  Complete Medication List: 1)  Allergy Shot  .... Take once a week 2)  Nasacort Aq 55 Mcg/act Aers (Triamcinolone acetonide(nasal)) .... Use 2 sprrays in each nostril once daily 3)  Bayer Low Strength 81 Mg Tbec (Aspirin) .... Take 1 tablet by mouth once a day 4)  Doxazosin Mesylate 8 Mg Tabs (Doxazosin mesylate) .... Take 1 tablet by mouth once a day 5)  Nexium 40 Mg Cpdr (Esomeprazole magnesium) .... Take one by mouth once daily 6)  Uroxatral 10 Mg Xr24h-tab (Alfuzosin hcl) .... Take one tab by mouth at bedtime.Marland KitchenMarland Kitchen 7)  Alprazolam 0.5 Mg Tabs (Alprazolam) .... Take 1/2 to 1 tab  by mouth three times a day as needed for nerves... 8)  Centrum Tabs (Multiple vitamins-minerals) .... Take 1 tablet by mouth once a day 9)  Lysine 500 Mg Caps (Lysine) .... Take one by mouth once daily 10)  Zinc 50 Mg Tabs (Zinc) .... Take one by mouth once daily 11)  Fluocinolone Acetonide 0.01 % Soln (Fluocinolone acetonide) .... Apply as directed... 12)  Lyrica 50 Mg Caps (Pregabalin) .... Take 1-2 caps by mouth at bedtime...  Other Orders: Prescription Created Electronically [phone]) Tdap => 23yrs IM (98119) Admin 1st Vaccine (14782)  Patient Instructions: 1)  Today we updated your med list- see below.... 2)  We refilled your meds for 2011 & wrote a new perscription for LYRICA to try 1-2 caps at bedtime for your groin pain.Marland KitchenMarland Kitchen 3)  Call for any questions.Marland KitchenMarland Kitchen 4)  Please schedule a follow-up appointment in 6 months & we will plan FASTING blood work at that time... Prescriptions: LYRICA 50 MG CAPS (PREGABALIN) take 1-2 caps by mouth at bedtime...  #60 x prn   Entered and Authorized by:   Michele Mcalpine MD   Signed by:   Michele Mcalpine MD on 06/22/2009   Method used:   Print then Give to Patient   RxID:   [id number] ALPRAZOLAM 0.5 MG  TABS (ALPRAZOLAM) take 1/2 to 1 tab by mouth three times a day as needed for nerves...  #100 x 5   Entered and Authorized by:   Michele Mcalpine MD   Signed by:   Michele Mcalpine MD on 06/22/2009   Method used:   Print then Give to Patient   RxID:   [id number] UROXATRAL 10 MG XR24H-TAB (ALFUZOSIN HCL) take one tab by mouth at bedtime...  #30 x prn   Entered and Authorized by:   Michele Mcalpine MD   Signed by:   Michele Mcalpine MD on 06/22/2009   Method used:   Print then Give to Patient   RxID:   [id number] NEXIUM 40 MG CPDR (ESOMEPRAZOLE MAGNESIUM) Take one by mouth once daily  #30 x prn   Entered and Authorized by:   Michele Mcalpine MD   Signed by:   Michele Mcalpine MD on 06/22/2009   Method used:   Print then Give to Patient   RxID:    [id number] DOXAZOSIN MESYLATE 8 MG  TABS (DOXAZOSIN MESYLATE) Take 1 tablet by mouth once a day  #30 x prn   Entered and Authorized by:   Michele Mcalpine  MD   Signed by:   Michele Mcalpine MD on 06/22/2009   Method used:   Print then Give to Patient   RxID:   [id number] NASACORT AQ 55 MCG/ACT  AERS (TRIAMCINOLONE ACETONIDE(NASAL)) use 2 sprrays in each nostril once daily  #1 x prn   Entered and Authorized by:   Michele Mcalpine MD   Signed by:   Michele Mcalpine MD on 06/22/2009   Method used:   Print then Give to Patient   RxID:   [id number] FLUOCINOLONE ACETONIDE 0.01 % SOLN (FLUOCINOLONE ACETONIDE) apply as directed...  #1 mo supply x prn   Entered and Authorized by:   Michele Mcalpine MD   Signed by:   Michele Mcalpine MD on 06/22/2009   Method used:   Print then Give to Patient   RxID:   [id number]    Immunizations Administered:  Tetanus Vaccine:    Vaccine Type: Tdap    Site: left deltoid    Mfr: boostrix    Dose: 0.5 ml    Route: IM    Given by: Randell Loop CMA    Exp. Date: 11/28/2010    Lot #: [id number]    VIS given: 04/17/07 version given June 22, 2009.

## 2010-07-01 NOTE — Progress Notes (Signed)
Summary: surgical clearance  Phone Note Call from Patient Call back at Home Phone [phone]   Caller: Clara Call For: nadel Summary of Call: pt needs an ok for surgery on his foot this hasnt been scheduled yet Initial call taken by: Lacinda Axon,  February 22, 2010 12:34 PM  Follow-up for Phone Call        Spoke with pt's spouse.  She states that pt saw Dr Lestine Box this am and was told that he would need to have foot surgery.  He is requesting that we fax note to Dr Lestine Box nurse Cordelia Pen at [phone] stating that pt is okay for surgery.  Pt was last seen 11/27/09.  Pls advise thanks! Follow-up by: Vernie Murders,  February 22, 2010 2:43 PM  Additional Follow-up for Phone Call Additional follow up Details #1::        Faxed to [facility]... SN

## 2010-07-01 NOTE — Letter (Signed)
Summary: Surgical Clearance/[facility]  Surgical Clearance/[facility]   Imported By: Sherian Rein 02/26/2010 12:12:05  _____________________________________________________________________  External Attachment:    Type:   Image     Comment:   External Document

## 2010-07-01 NOTE — Progress Notes (Signed)
Summary: surgery clearance - not received  Phone Note Call from Patient Call back at Home Phone [phone]   Caller: Patient Call For: Kriste Basque Summary of Call: states [facility] @ Dr. Lestine Box' office did not receive pt's surgery clearance.  Leigh, it is unclear if the ov note was faxed or a letter.  asking this to be re-faxed to [facility] @ [phone] Initial call taken by: Boone Master CNA/MA,  February 23, 2010 4:04 PM  Follow-up for Phone Call        rhonda called and they did receive the surgical clearance papers and have already scheduled his surgery. Randell Loop [facility]  February 25, 2010 10:22 AM

## 2010-07-02 NOTE — Letter (Signed)
Summary: [facility]  [facility]   Imported By: Sherian Rein 03/02/2010 11:57:49  _____________________________________________________________________  External Attachment:    Type:   Image     Comment:   External Document

## 2010-07-30 ENCOUNTER — Encounter: Payer: Self-pay | Admitting: Pulmonary Disease

## 2010-07-30 ENCOUNTER — Ambulatory Visit (INDEPENDENT_AMBULATORY_CARE_PROVIDER_SITE_OTHER): Payer: Medicare Other | Admitting: Pulmonary Disease

## 2010-07-30 DIAGNOSIS — K219 Gastro-esophageal reflux disease without esophagitis: Secondary | ICD-10-CM

## 2010-07-30 DIAGNOSIS — J309 Allergic rhinitis, unspecified: Secondary | ICD-10-CM

## 2010-07-30 DIAGNOSIS — E785 Hyperlipidemia, unspecified: Secondary | ICD-10-CM

## 2010-07-30 DIAGNOSIS — M1990 Unspecified osteoarthritis, unspecified site: Secondary | ICD-10-CM

## 2010-07-30 DIAGNOSIS — I10 Essential (primary) hypertension: Secondary | ICD-10-CM

## 2010-07-30 DIAGNOSIS — E663 Overweight: Secondary | ICD-10-CM

## 2010-07-30 DIAGNOSIS — K2270 Barrett's esophagus without dysplasia: Secondary | ICD-10-CM

## 2010-07-30 DIAGNOSIS — F411 Generalized anxiety disorder: Secondary | ICD-10-CM

## 2010-08-04 ENCOUNTER — Other Ambulatory Visit: Payer: Medicare Other

## 2010-08-04 ENCOUNTER — Other Ambulatory Visit: Payer: Self-pay | Admitting: Pulmonary Disease

## 2010-08-04 ENCOUNTER — Encounter (INDEPENDENT_AMBULATORY_CARE_PROVIDER_SITE_OTHER): Payer: Self-pay | Admitting: *Deleted

## 2010-08-04 ENCOUNTER — Telehealth: Payer: Self-pay | Admitting: Pulmonary Disease

## 2010-08-04 DIAGNOSIS — E119 Type 2 diabetes mellitus without complications: Secondary | ICD-10-CM

## 2010-08-04 DIAGNOSIS — I10 Essential (primary) hypertension: Secondary | ICD-10-CM

## 2010-08-04 DIAGNOSIS — E785 Hyperlipidemia, unspecified: Secondary | ICD-10-CM

## 2010-08-04 LAB — LIPID PANEL
LDL Cholesterol: 118 mg/dL — ABNORMAL HIGH (ref 0–99)
VLDL: 29.2 mg/dL (ref 0.0–40.0)

## 2010-08-04 LAB — HEMOGLOBIN A1C: Hgb A1c MFr Bld: 6.1 % (ref 4.6–6.5)

## 2010-08-04 LAB — BASIC METABOLIC PANEL
Chloride: 110 mEq/L (ref 96–112)
GFR: 88.98 mL/min (ref >60.00)
Glucose, Bld: 98 mg/dL (ref 70–99)
Potassium: 4.4 mEq/L (ref 3.5–5.1)
Sodium: 142 mEq/L (ref 135–145)

## 2010-08-05 NOTE — Assessment & Plan Note (Signed)
Summary: 8 month follow up   CC:  8 month ROV & review of mult medical problems....  History of Present Illness: 76 y/o Wilson here for a follow up visit...  he has multiple medical problems as noted below...   Followed for general medical purposes w/ hx HBP, Hyperlipidemia, Barrett's esoph, ischemic colitis in 2008, BPH, and DJD...   ~  June 22, 2009:  he had a good 46mo- just c/o some burning pain in left groin area... ?loose ing ring/ ing hernia but not bad enough for surg he says- try Lyrica 50-100 Qhs first... needs refill Rx for 2011 & due for TDAP today.   ~  November 27, 2009:  he's been doing well x left foot> surg x2 by Podiatrist for bunion, & toe shortening... we discussed poss 2nd opinion w/ Orthopedist- DrBednarz, he will decide... otherw feeling well- no new complaints or concerns...    ~  July 30, 2010:  he had additional right foot surg- this time by DrBednarz & imroved, but he is requesting order for phys therapy & we will oblige... also offered Tramadol for pain as he is just using tylenol Prn... he continues to f/u w/ DrESL for allergies & now taking Astelin spray Prn... BP controlled on meds & feeling well... still trying to regulate Chol w/ diet alone but weight up 9# & he will ret for FLP & consider med rx if nec... GI & GU are stable 7 he is voiding well w/o the Uroxatrol...  up to date on vaccinations.   Current Problem List:  ALLERGIC RHINITIS (ICD-477.9) - DrESL referred pt to [facility] for nasal polyps... on Aselin Prn & allergy shots weekly... prev had Patanase, Nasocort, etc...  HYPERTENSION (ICD-401.9) - controlled on ASA 81mg /d, & DOXAZOSIN 8mg /d... BP=136/80 and he feels well... doesn't check BP at home but it's OK at the drug store... denies HA, fatigue, visual changes, CP, palipit, dizziness, syncope, dyspnea, edema, etc...   ~  he had a negative Cardiolite in 2001 and normal CDoppler's in 2006.  HYPERLIPIDEMIA (ICD-272.4) - on diet alone... he has repeatedly  refused Statin Rx.  ~  FLP 8/07 showed TChol 189, TG 200, HDL 32, LDL 117... rec- med Rx, but he preferred diet.  ~  FLP 8/09 showed TChol 179, TG 94, HDL 38, LDL 122... rec- consider meds, he refuses.  ~  FLP 8/10 showed TChol 186, TG 140, HDL 37, LDL 121... better diet, get wt down.  ~  FLP 7/11 showed TChol 198, TG 159, HDL 41, LDL 125... he continues to decline statin Rx.  ~  FLP 3/12 showed TChol ==> pending  OVERWEIGHT (ICD-278.02) - we discussed diet + exercise program sufficient to lose weight.  ~  weight 2/10 = 239#   ~  weight 8/10 = 244#  ~  weight 1/11 = 238#  ~  weight 7/11 = 240#  ~  weight 3/12 = 249#... we reviewed need for diet + exercise.  GERD (ICD-530.81) & BARRETTS ESOPHAGUS (ICD-530.85) - followed by DrPatterson/Brodie... on NEXIUM 40mg /d...  ~  EGD 7/08 showed HH and Barrett's mucosa (no dysplasia)...  ~  EGD 8/10 showed sm hyperplastic polyps, & 5cm segment of Barrett's mucosa... f/u planned 11yrs.  Hx of ISCHEMIC COLITIS (ICD-557.9) - Hosp 5/08 w/ abd pain & lower GIB.Marland Kitchen. eval revealed prob ischemic colitis... he had a f/u colonoscopy 7/08 which was normal without colitis... no recurrent symptoms to date...  BENIGN PROSTATIC HYPERTROPHY, HX OF (ICD-V13.8) - hx BPH  and UTI in the past... DOXAZOSIN helps... INTOL Flomax "it affects my eyes"; added Uroxatrol 10mg /d 8/10 and improved nocturia & LTOS, he has stopped this med & states voiding well...  ~  labs 8/07 showed PSA= 2.6  ~  labs 8/09 showed PSA= 2.04  ~  labs 8/10 showed PSA= 2.54  ~  labs 7/11 showed PSA= 2.76  DEGENERATIVE JOINT DISEASE (ICD-715.90) - S/P Right TKR 2/08 & Left TKR 2/09 DrAlusio... & 2 prev neck surg by DrHirsh... he notes occas LBP as well...  ~  7/11: he reports 2 operations left foot by podiatrist for bunion & toe straightening.  ~  10/11: he has 3rd operation on right foot, this one from DrBednarz, & finally improved.  NEUROPATHY (ICD-355.9) - prev tried Lyrica- ?benefit...  ANXIETY  (ICD-300.00) - he is under mod stress and requests ALPRAZOLAM 0.5mg - 1/2 to 1 tab Tid Prn...  Health Maintenance:  he gets the yearly flu vaccine each fall... given PNEUMOVAX  ~2007... given TDAP 1/11...   Allergies: 1)  ! Flomax (Tamsulosin Hcl)  Comments:  Nurse/Medical Assistant: The patient's medications and allergies were reviewed with the patient and were updated in the Medication and Allergy Lists.  Past History:  Past Medical History: ALLERGIC RHINITIS (ICD-477.9) HYPERTENSION (ICD-401.9) HYPERLIPIDEMIA (ICD-272.4) OVERWEIGHT (ICD-278.02) GERD (ICD-530.81) BARRETTS ESOPHAGUS (ICD-530.85) Hx of ISCHEMIC COLITIS (ICD-557.9) BENIGN PROSTATIC HYPERTROPHY, HX OF (ICD-V13.8) DEGENERATIVE JOINT DISEASE (ICD-715.90) NEUROPATHY (ICD-355.9) ANXIETY (ICD-300.00)  Past Surgical History: S/P Lap Chole 1993 by DrPYoung S/P 2 Neck Surgeries by DrHirsh in 2005-6 w/ ant cx decompression, diskectomy, fusion, & C6-7 allograft S/P Right TKR 2/08 by DrAlusio S/P Left TKR 2/09 by DrAlusio S/P appendectomy  Family History: Reviewed history from 11/27/2009 and no changes required. mother deceased age 78 from leukemia father deceased age 80 from heart attack 1 sibling alive age 70  Social History: Reviewed history from 01/04/2008 and no changes required. Patient never smoked.  exposed to second hand smoke limited exercise drinks caffeine--2 cups per day married 1 child  Review of Systems      See HPI       The patient complains of dyspnea on exertion and difficulty walking.  The patient denies anorexia, fever, weight loss, weight gain, vision loss, decreased hearing, hoarseness, chest pain, syncope, peripheral edema, prolonged cough, headaches, hemoptysis, abdominal pain, melena, hematochezia, severe indigestion/heartburn, hematuria, incontinence, muscle weakness, suspicious skin lesions, transient blindness, depression, unusual weight change, abnormal bleeding, enlarged lymph  nodes, and angioedema.    Vital Signs:  Patient profile:   76 year old male Height:      71.5 inches Weight:      249.2 pounds O2 Sat:      96 % on Room air Temp:     96.8 degrees F oral Pulse rate:   62 / minute BP sitting:   136 / 80  (left arm) Cuff size:   regular  Vitals Entered By: Randell Loop CMA (July 30, 2010 10:24 AM)  O2 Sat at Rest %:  96 O2 Flow:  Room air  Physical Exam  Additional Exam:  WD, Overweight, 76 y/o Wilson in NAD... GENERAL:  Alert & oriented; pleasant & cooperative... HEENT:  [place]/AT, EACs-clear, TMs-wnl, NOSE-clear, THROAT-clear & wnl. NECK:  Supple w/ fairROM; no JVD; normal carotid impulses w/o bruits; no thyromegaly or nodules palpated; no lymphadenopathy. CHEST:  Clear to P & A; without wheezes/ rales/ or rhonchi. HEART:  Regular Rhythm; without murmurs/ rubs/ or gallops. ABDOMEN:  Soft & nontender, w/  diastasis recti, normal bowel sounds; no organomegaly or masses detected. Groin- ?sm hernia on right, min tender, otherw neg. EXT: mod arthritic changes s/p bilat TKRs & right foot surg; no varicose veins/ venous insuffic/ or edema. NEURO:  CN's intact; no focal neuro deficits... DERM:  no lesions noted...    Impression & Recommendations:  Problem # 1:  HYPERTENSION (ICD-401.9) Controlled>  same med. His updated medication list for this problem includes:    Doxazosin Mesylate 8 Mg Tabs (Doxazosin mesylate) .Marland Kitchen... Take 1 tablet by mouth once a day  Problem # 2:  HYPERLIPIDEMIA (ICD-272.4) He will ret for FLP>  continues on diet alone, but wt up 9# & he may need meds if FLP worsening...  Problem # 3:  OVERWEIGHT (ICD-278.02) Diet/ Exercise/ wt reduction is key!!!  He requests phys therapy s/p foot surg in Oct...  Problem # 4:  BARRETTS ESOPHAGUS (ICD-530.85) GI is stable on Nexium>  f/u EGD due in 2013...  Problem # 5:  BENIGN PROSTATIC HYPERTROPHY, HX OF (ICD-V13.8) Voiding better & off the Uroxatrol now...  Problem # 6:  DEGENERATIVE JOINT  DISEASE (ICD-715.90) s/p surg from drBednarz & improved but he wants some Pt & will will order it per his request... His updated medication list for this problem includes:    Bayer Low Strength 81 Mg Tbec (Aspirin) .Marland Kitchen... Take 1 tablet by mouth once a day    Tramadol Hcl 50 Mg Tabs (Tramadol hcl) .Marland Kitchen... Take 1 tab up to three times daily as needed for pain...  Orders: Physical Therapy Referral (PT)  Problem # 7:  OTHER MEDICAL PROBLEMS AS NOTED>>>  Complete Medication List: 1)  Allergy Shot  .... Take once a week 2)  Astelin 137 Mcg/spray Soln (Azelastine hcl) .... Use as directed... 3)  Bayer Low Strength 81 Mg Tbec (Aspirin) .... Take 1 tablet by mouth once a day 4)  Doxazosin Mesylate 8 Mg Tabs (Doxazosin mesylate) .... Take 1 tablet by mouth once a day 5)  Nexium 40 Mg Cpdr (Esomeprazole magnesium) .... Take one by mouth once daily 6)  Alprazolam 0.5 Mg Tabs (Alprazolam) .... Take 1/2 to 1 tab by mouth three times a day as needed for nerves.Marland KitchenMarland Kitchen 7)  Centrum Tabs (Multiple vitamins-minerals) .... Take 1 tablet by mouth once a day 8)  Lysine 500 Mg Caps (Lysine) .... Take one by mouth once daily 9)  Zinc 50 Mg Tabs (Zinc) .... Take one by mouth once daily 10)  Tramadol Hcl 50 Mg Tabs (Tramadol hcl) .... Take 1 tab up to three times daily as needed for pain...  Patient Instructions: 1)  Today we updated your med list- see below.... 2)  We refilled your meds for 2012... 3)  We wrote a new perscription for TRAMADOL to take up to 3 per day as needed for PAIN... 4)  We will arrange for Phys Therapy to help w/ your mobility.Marland KitchenMarland Kitchen 5)  Please ret to our lab one morn next week for your interim FASTING blood work... 6)  please call the "phone tree" in a few days for your lab results.Marland KitchenMarland Kitchen 7)  Call for any questions... 8)  Please schedule a follow-up appointment in 6 months. Prescriptions: TRAMADOL HCL 50 MG TABS (TRAMADOL HCL) take 1 tab up to three times daily as needed for pain...  #50 x 6    Entered and Authorized by:   Michele Mcalpine MD   Signed by:   Michele Mcalpine MD on 07/30/2010   Method used:  Print then Give to Patient   RxID:   [id number] ALPRAZOLAM 0.5 MG  TABS (ALPRAZOLAM) take 1/2 to 1 tab by mouth three times a day as needed for nerves...  #100 x 5   Entered and Authorized by:   Michele Mcalpine MD   Signed by:   Michele Mcalpine MD on 07/30/2010   Method used:   Print then Give to Patient   RxID:   [id number] NEXIUM 40 MG CPDR (ESOMEPRAZOLE MAGNESIUM) Take one by mouth once daily  #30 x 12   Entered and Authorized by:   Michele Mcalpine MD   Signed by:   Michele Mcalpine MD on 07/30/2010   Method used:   Print then Give to Patient   RxID:   [id number] DOXAZOSIN MESYLATE 8 MG  TABS (DOXAZOSIN MESYLATE) Take 1 tablet by mouth once a day  #30 x 12   Entered and Authorized by:   Michele Mcalpine MD   Signed by:   Michele Mcalpine MD on 07/30/2010   Method used:   Print then Give to Patient   RxID:   [id number]

## 2010-08-10 NOTE — Progress Notes (Signed)
Summary: speak to nurse  Phone Note Call from Patient Call back at Home Phone [phone] P PH     Caller: Patient Call For: nadel Summary of Call: Pt stated that SN set him up to have therapy with [facility] for his foot but it was supposed to be his leg pls advise. Initial call taken by: Darletta Moll,  August 04, 2010 3:56 PM  Follow-up for Phone Call        Spoke with pt.  He states that Aggie Cosier from [employer] called him and stated that we had set him up for appt to eval his foot.  I advised that this is not what he ordered for him, but we did send referal to [facility] for strengthening, balance, etc of his legs after having TKRs.  He states that he does not want to see them, just wants some "therapy" for his legs.  Pls advise thanks Follow-up by: Vernie Murders,  August 04, 2010 4:20 PM  Additional Follow-up for Phone Call Additional follow up Details #1::        Order for PT was faxed to [facility] to fax # [phone]. Called [facility] and had to leave message for Joyce Gross in the PT dept to call me back. The referral clearly states PT. Alfonso Ramus  August 04, 2010 4:42 PM  Spoke with Kenney Houseman at [facility] in the PT Dept. and in order for pt's insurance to cover PT pt must be evaluated by an ortho physician, then PT can be ordered. Called pt and advised of the above and pt stated that he had no problem doing this and will call and schedule appt with Dr. Lequita Halt for his leg.  Dr. Lequita Halt will order the PT if needed. Pt is aware of the above and voice understanding. Called Kenney Houseman at [facility] [phone] ext [phone] and cancelled our referral for PT. Additional Follow-up by: Alfonso Ramus,  August 05, 2010 10:34 AM

## 2010-10-12 NOTE — H&P (Signed)
NAME:  Isaiah Wilson, Isaiah Wilson NO.:  [id number]   MEDICAL RECORD NO.:  [id number]          PATIENT TYPE:  INP   LOCATION:  1616                         FACILITY:  [facility]   PHYSICIAN:  Casimiro Needle B. Sherene Sires, MD, FCCPDATE OF BIRTH:  09-20-36   DATE OF ADMISSION:  10/28/2006  DATE OF DISCHARGE:                              HISTORY & PHYSICAL   CHIEF COMPLAINT:  Abdominal pain and bloody diarrhea.   HISTORY:  This is a 76 year old white male who states he has seen Dr.  Jarold Motto in the past for colitis, but this was diagnosed back in the  1990s.  He was told it might come back, but that he did not need to take  anything for it. He has also been under the treatment by Dr. Jarold Motto  for Barrett's esophagitis and states he takes Nexium consistently.  Abruptly on 10/28/2006 around lunch time, he developed diffuse sweating,  nausea and bilateral lower abdominal crampy pain.  This was associated  with one episode of emesis (food material only) and then a sensation of  urge to defecate.  He did subsequently have multiple bowel movements,  the last of which turned into bright red blood.  He is not sure  exactly how much blood he lost.  Subsequent to this one episode of  rectal bleeding, he has had no subsequent stools but was found to have  bright red blood on rectal exam by the ER physician who recommended  admission.   The patient has also continued to have diffuse abdominal crampy pain,  slightly worse on the right than left but bilateral lower in nature and  improved after Dilaudid.  He has had no more nausea or sweating.  He  denies any fever, chills, unusual exposure history, myalgias,  arthralgias, rash or recent antibiotics.   PAST HISTORY:  1. Degenerative arthritis for which he is status post right knee      replacement in February 2008.  2. Hypertension.  3. GERD/Barrett's.  4. History of urinary tract infection/BPH.   ALLERGIES:  NONE KNOWN.   MEDICATIONS:  Aspirin  at 81 mg daily, Doxazosin, fexofenadine, Lysine,  Nexium, Vicodin, Toprol and zinc sulfate.   SOCIAL HISTORY:  He has never smoked.  He denies any unusual travel.  He  denies any recent travel history or unusual exposure history or sick  contacts.   FAMILY HISTORY:  Significant for heart disease in his father, cancer in  his mother.  No one has inflammatory bowel disease to his knowledge.   REVIEW OF SYSTEMS:  Essentially this patient has been perfectly healthy  with no chronic complaints, abdominal or otherwise until the acute onset  of present illness.   PHYSICAL EXAMINATION:  GENERAL:  This is a pleasant white male who does  not appear acutely ill or uncomfortable.  VITAL SIGNS:  He is afebrile with normal vital signs with no  tachycardia.  HEENT:  Unremarkable.  Pharynx is clear.  Dentition is intact.  NECK:  Supple without cervical adenopathy or tenderness.  Trachea is  midline.  No thyromegaly. No JVD. No nodes. Lungs clear to A  and P bilaterally.  There is regular rhythm without murmur nor rub.  ABDOMEN:  Soft, mild to moderately distended but with only minimal  tenderness in the lower quadrants diffusely with no rebound or guarding.  RECTAL EXAM:  Frank bright red blood which is guaiac positive per ER  physician.  EXTREMITIES:  Warm, without calf tenderness, cyanosis, clubbing or  edema.  His right knee wound appears well healed.  NEUROLOGIC:  No focal deficits.  SKIN EXAM:  Warm and dry with no rash.   LAB DATA:  White count was 10,000, hematocrit of 38.2, normal chemistry  profile.   CT scan was consistent with descending colitis with the differential  being inflammatory versus ischemic.   There was also mild prostatic enlargement.   IMPRESSION:  1. Acute bloody diarrhea with diffuse abdominal pain certainly      suggestive of inflammatory colitis since he has had this before but      also could represent ischemic colitis.  For now, I recommended      bowel rest,  fluids and recheck CBC in the morning.  Will      empirically treat him also with Flagyl pending stool culture and      stool for Clostridium difficile.  I will notify Dr. Norval Gable      service in the morning of the patient's admission for consideration      for colonoscopy.  2. Hypertension.  Continue Toprol.  3. Benign prostatic hypertrophy.  Continue doxazosin as blood pressure      tolerates.  I will, however, hold aspirin.      Charlaine Dalton. Sherene Sires, MD, [facility]  Electronically Signed     MBW/MEDQ  D:  10/29/2006  T:  10/29/2006  Job:  [id number]

## 2010-10-12 NOTE — Assessment & Plan Note (Signed)
Isaiah Wilson                         GASTROENTEROLOGY OFFICE NOTE   Isaiah Wilson, Isaiah Wilson                     MRN:          [id number]  DATE:11/21/2006                            DOB:          Sep 01, 1934    Isaiah Wilson is a middle-aged white male who had an episode of ischemic  colitis 20 years ago requiring hospitalization.  He was recently  admitted from May 31 to November 01, 2006 with sudden abdominal pain, rectal  bleeding, and a CT scan which showed changes of ischemic colitis in the  left lower quadrant area.  He was treated conservatively and expectantly  and has done well and is currently asymptomatic.  He initially presented  with crampy lower abdominal pain and a rather profuse rectal bleeding.  Colonoscopy was not repeated during the hospitalization.  He is on  Nexium 40 mg a day for chronic acid reflux.   His history and physical exam is otherwise detailed.  There is  degenerative arthritis, hypertension, and history of recurrent UTIs with  BPH.  He is on multiple medications.  Again, the list is reviewed and in  his chart, which are mostly supplements.  He currently is on p.r.n.  oxycodone, takes Toprol XL 50 mg a day, and doxazosin 8 mg a day.   He denies drug allergies.   He denies the recent use of nasal decongestants or other over-the-  counter medications.   He denies any history of significant coronary artery disease or  peripheral vascular disease.  He has had previous Cardiolite stress  tests that were negative.   Recent CT scan of the abdomen showed some small lung nodules that are  being followed by Dr. Kriste Basque.  There is some decreased activity in the  liver that is suggestive of fatty infiltration of the liver, and there  was a left lower renal pole cyst noted.  He also has lumbar spondylosis.   Today, Isaiah Wilson denies any GI complaints.  He is having regular bowel  movements without melena or hematochezia.  His acid reflux is  not  bothering him.  He denies dysphagia or any hepatobiliary problems.   PHYSICAL EXAMINATION:  VITAL SIGNS:  He weighs 221 pounds, which is down  some 18 pounds from June, 2006.  Blood pressure is 112/72.  Pulse was 64  and regular.  GENERAL:  I could not appreciate stigmata of chronic liver disease.  CHEST:  Clear.  CARDIAC:  He appeared to be in a regular rhythm without significant  murmurs, rubs or gallops.  ABDOMEN:  There is no hepatosplenomegaly, abdominal masses, or  tenderness.  Bowel sounds were normal.  EXTREMITIES:  Peripheral extremities were unremarkable, and pulses were  intact.  RECTAL:  Deferred.   ASSESSMENT:  1. An episode of ischemic colitis of questionable etiology without any      history of peripheral vascular disease or cardiac arrhythmias or      recent cardiac events.  2. Chronic gastroesophageal reflux disease with known Barrett's to the      mucosa of the esophagus with need of follow-up exam from 2004.  On  reviewing his record, I cannot see evidence of dysplasia.  3. Mild essential hypertension, which is well controlled.  4. Degenerative arthritis but he denies nonsteroidal use.   RECOMMENDATIONS:  1. Outpatient endoscopy and colonoscopy at his convenience.  2. Continue all medications listed above.  3. Check follow-up CBC.  4. Consider MRI/angiogram of the abdominal mesenteric vasculature and      hypercoagulability workup, depending on his clinical course.  5. Patient has to avoid any nasal decongestants or any other over-the-      counter medications or NSAIDs without first reviewing these      decisions with his physicians.     Vania Rea. Jarold Motto, MD, [facility], FAGA  Electronically Signed    DRP/MedQ  DD: 11/21/2006  DT: 11/21/2006  Job #: [id number]

## 2010-10-12 NOTE — Discharge Summary (Signed)
NAME:  Isaiah Wilson, Isaiah Wilson NO.:  [id number]   MEDICAL RECORD NO.:  [id number]          PATIENT TYPE:  INP   LOCATION:  1616                         FACILITY:  [facility]   PHYSICIAN:  Lonzo Cloud. Kriste Basque, MD     DATE OF BIRTH:  30-Jun-1934   DATE OF ADMISSION:  10/28/2006  DATE OF DISCHARGE:  11/01/2006                               DISCHARGE SUMMARY   FINAL DIAGNOSES:  1. Admitted Oct 28, 2006 with lower abdominal pain, diarrhea, and      bright red blood per rectum.  Evaluation revealed segmental      ischemic colitis, and he was treated conservatively with      observation, etc.  The patient improved, and an outpatient followup      with Dr. Jarold Motto planned.  2. History of colitis in the past, evaluated by Dr. Jarold Motto.  Last      colonoscopy 2003 was normal, with no abnormalities detected.  3. History of gastroesophageal reflux disease and Barrett's esophagus,      also followed by Dr. Jarold Motto.  4. Hypertension - controlled on Toprol.  5. Severe degenerative arthritis, with previous right total knee      surgery, February 2008.  6. History of benign prostatic hypertrophy and urinary tract      infection.  7. History of cervical spine disease, with previous anterior cervical      decompression, diskectomy, and fusion, with two operations in 2005      and 2006 by Dr. Phoebe Perch.   BRIEF HISTORY AND PHYSICAL:  The patient is a 76 year old gentleman  known to me, admitted by Dr. Sherene Sires in my absence when he presented to the  emergency room with sudden onset of diffuse lower abdominal crampy pain,  nausea, vomiting on one occasion, multiple loose bowel movements, and  bright red blood per rectum.  He was evaluated by the emergency room  physician, found to have an acute lower GI bleeding, and was referred  for admission.  He had a previous history of colitis in the past and  has been followed since the 1990s by Dr. Jarold Motto.  He has known hiatus  hernia, gastroesophageal  reflux disease, and Barrett's esophagus, with  frequent endoscopies by Dr. Jarold Motto.  His last colonoscopy in November  2003 was normal.   PAST MEDICAL HISTORY:  1. In addition to above, he has a history of hypertension that is      controlled on Toprol and Cardura.  2. He has a history of BPH and urinary tract infections.  3. He has a history of degenerative arthritis, with right total knee      replacement by Dr. Lequita Halt in February 2008.  4. Two previous neck operations by Dr. Phoebe Perch in 2005 and 2006,      consisting of an anterior cervical decompression, diskectomy,      fusion, and C6-7 allograft.   PHYSICAL EXAMINATION:  GENERAL:  Pleasant 76 year old gentleman no acute  distress.  VITAL SIGNS:  Blood pressure 120/80, pulse 64 per minute and regular,  respirations 18 per minute and not labored, temperature 98 degrees.  HEENT:  Unremarkable.  NECK:  Showed no jugular venous distension, no carotid bruits, no  thyromegaly or lymphadenopathy.  CHEST:  Clear to percussion and auscultation.  CARDIAC:  Revealed a regular rhythm, normal S1 and S2, without murmurs,  rubs, or gallops detected.  ABDOMEN:  Soft, with mild to moderate distention, and minimal tenderness  in the lower quadrants on palpation.  No guarding or rebound.  RECTAL:  Stool was Hematest positive.  EXTREMITIES:  Warm, with good pulses.  Previous total knee surgery as  noted.  NEUROLOGIC:  Intact.   LABORATORY DATA:  EKG showed sinus bradycardia, left axis deviation, and  nonspecific ST-T wave changes.  Chest x-ray showed borderline heart  size, no evidence of CHF or active disease, prior cervical fusion noted.  CT scan of the abdomen and pelvis showed descending colitis, with mild  to moderate atherosclerotic changes noted.  There appeared to be fatty  infiltration of the liver as well.  Incidental note made of 2 mm right  lower lobe lung nodule and 1 mm left lower lobe nodule.  Pelvic scan was  negative, except  for some lumbar spondylosis and increased prostate.  Hemoglobin 12.8, hematocrit 38.2, white count 10,200, with a normal  differential.  Protime 13.5, INR 1.  Sodium 139, potassium 3.8, chloride  106, CO2 24, BUN 5, creatinine 0.8, blood sugar 108, calcium 9, total  protein 6.4, albumin 3.8, AST 19, ALT 15, alk phos 63, total bilirubin  0.8, blood type A positive.   HOSPITAL COURSE:  The patient was admitted with lower GI bleed for  evaluation.  He was known to have a history of colitis when seen by Dr.  Jarold Motto in the 1990s.  His last colonoscopy in 2003 was normal.  He  was evaluated by Dr. Marina Goodell and Dr. Leone Payor from the GI team.  They felt  that he most likely had an acute segmental ischemic colitis involving  the left colon.  They recommended conservative management, with initial  clear liquid diet, and he was advanced to a low-residue diet.  His  diarrhea was controlled with Imodium.  He was given a short course of  Flagyl, but this was discontinued.  He improved slowly with this  conservative management.  Blood pressure remained stable, and serial  blood counts showed hemoglobin above 12.   His other medical problems were addressed.  His blood pressure remained  stable.  He was continued on his proton pump inhibitor therapy for his  gastroesophageal reflux disease and Barrett's esophagus.  His arthritis  was not an issue.  He was treated with hydrocodone and avoided  nonsteroidals due to his GI problems.  The patient was felt to be MHB  and be ready for discharge on November 01, 2006.   MEDICATIONS AT DISCHARGE:  1. Toprol-XL 50, 1 p.o. daily.  2. Cardura 8 mg p.o. daily.  3. Enteric-coated aspirin 81 mg p.o. daily.  4. Nexium 40 mg p.o. taken 30 minutes before breakfast.  5. Fexofenadine 180 mg p.o. daily as needed for allergies.  6. Lysine daily.  7. Methocarbamol 500 mg p.o. b.i.d. as needed for muscle spasm. 8. Vicodin 1 tablet q.4 h. as needed for pain.  9. Imodium 1 tablet  q.4 h. as needed for watery diarrhea.   The patient was instructed on a low-residue diet and will follow up with  Dr. Jarold Motto in the office on November 14, 2006 at 10 a.m.Marland Kitchen  At that time,  Dr. Jarold Motto will address the need for follow-up  colonoscopy (since it  has been 5 years).   CONDITION ON DISCHARGE:  Improved.      Lonzo Cloud. Kriste Basque, MD  Electronically Signed     SMN/MEDQ  D:  11/01/2006  T:  11/01/2006  Job:  [id number]

## 2010-10-15 NOTE — Op Note (Signed)
NAME:  Isaiah Wilson, Isaiah Wilson NO.:  [id number]   MEDICAL RECORD NO.:  [id number]          PATIENT TYPE:  INP   LOCATION:  X005                         [facility]ACILITY:  [facility]   PHYSICIAN:  Ollen Gross, M.D.    DATE OF BIRTH:  Feb 27, 1935   DATE OF PROCEDURE:  07/26/2006  DATE OF DISCHARGE:                               OPERATIVE REPORT   PREOPERATIVE DIAGNOSIS:  Osteoarthritis right knee.   POSTOPERATIVE DIAGNOSIS:  Osteoarthritis right knee.   PROCEDURE:  Right total knee arthroplasty.   SURGEON:  Ollen Gross, M.D.   ASSISTANT:  Avel Peace, PA-C   ANESTHESIA:  General with postop Marcaine pain pump.   ESTIMATED BLOOD LOSS:  Minimal.   DRAIN:  Hemovac times one.   TOURNIQUET TIME:  41 minutes at 300 mmHg.   COMPLICATIONS:  None.   CONDITION:  Stable to recovery.   BRIEF CLINICAL NOTE:  Isaiah Wilson is a 76 year old male end-stage  osteoarthritis right knee intractable pain.  Has failed nonoperative  management, presents for total knee arthroplasty.   PROCEDURE IN DETAIL:  After successful administration of general  anesthetic, a tourniquet placed on the right thigh and right lower  extremity prepped and draped in the usual sterile fashion.  Extremity  was wrapped in Esmarch, knee flexed, tourniquet inflated to 300 mmHg.  Midline incision made with a 10 blade through subcutaneous tissue to the  level of the extensor mechanism.  Fresh blade is used to make a medial  parapatellar arthrotomy.  Soft tissue of the proximal medial tibia  subperiosteally elevated to the joint line with the knife into the  semimembranosus bursa with a Cobb elevator.  Soft tissue laterally is  elevated with attention being paid to avoid the patellar tendon on  tibial tubercle.  Patella subluxed laterally, knee flexed 90 degrees,  ACL and PCL removed.  Drill was used create a starting hole and the  distal femur canal was thoroughly irrigated.  The 5 degrees right valgus  alignment  guide was placed and referencing off the posterior condyles.  The block is pinned to remove 11 mm off the distal femur.  Distal  femoral resection is made an oscillating saw.  Sizing blocks placed,  size 5 is most appropriate.  Rotations marked off the epicondylar axis.  Size 5 cutting blocks placed and the anterior-posterior and chamfer cuts  were made.   Tibia subluxed forward and the menisci removed.  Extramedullary tibial  alignment guides placed referencing proximally at the medial aspect of  the tibial tubercle and distally along the second metatarsal axis and  tibial crest.  The blocks pinned to remove about 10 mm of the non  deficient lateral side.  Tibial resection is made with an oscillating  saw.  Size 5 is the most appropriate tibial component and then the  proximal tibia is prepared with the modular drill and keel punch for  size 5.  Femoral preparation is completed with the intercondylar cut.   Size 5 mobile bearing tibial trial, size 5 posterior stabilized femoral  trial and 10 mm posterior stabilized rotating platform insert trial were  placed.  With a 10 full extension was achieved with excellent varus and  valgus balance throughout full range of motion.  Patella was then  everted and thickness measured to be 26 mm.  Freehand resection is taken  to 14 mm, 41 template is placed, lug holes were drilled, trial patella  was placed and it tracks normally.  Osteophytes removed off the  posterior femur with the trial placed.  All trials removed and the cut  bone surfaces are prepared with pulsatile lavage.  Cements mixed and  once ready for implantation the size 5 mobile bearing tibial tray, size  5 posterior stabilized femur and 41 patella are cemented into place.  Patella is held the clamp.  Trial 10-mm insert was placed, knee held in  full extension and all extruded cement removed.  Once cement fully  hardened, the permanent 10 mm posterior stabilized rotating platform   insert is placed into the tibial tray.  Wounds copiously irrigated with  saline solution and the extensor mechanism closed over Hemovac drain  with interrupted #1 PDS.  Flexion against gravity is 135 degrees.  Tourniquet released with total tourniquet time of 41 minutes.  Subcu was  then closed interrupted 2-0 Vicryl, subcuticular running 4-0 Monocryl.  The catheter for Marcaine pain pump was placed and the pump initiated.  Hemovac drains hooked to suction.  Steri-Strips and a bulky sterile  dressing are applied and he was placed into a knee immobilizer, awakened  and transferred to recovery in stable condition.      Ollen Gross, M.D.  Electronically Signed     FA/MEDQ  D:  07/26/2006  T:  07/26/2006  Job:  [id number]

## 2010-10-15 NOTE — Discharge Summary (Signed)
NAME:  Isaiah Wilson, Isaiah Wilson NO.:  [id number]   MEDICAL RECORD NO.:  [id number]                   PATIENT TYPE:  INP   LOCATION:  5003                                 FACILITY:  [facility]   PHYSICIAN:  Loreta Ave, M.D.              DATE OF BIRTH:  08/27/1934   DATE OF ADMISSION:  06/09/2003  DATE OF DISCHARGE:  06/14/2003                                 DISCHARGE SUMMARY   ADMISSION DIAGNOSIS:  Advanced degenerative joint disease of the left knee.   DISCHARGE DIAGNOSES:  1. Advanced degenerative joint disease of the left knee.  2. Hypertension.  3. Sleep apnea.  4. Hypokalemia.   PROCEDURE:  Left total knee replacement.   HISTORY:  [name] with persistent pain and discomfort in his left  knee secondary to advanced degenerative joint disease. He is having  difficulty with activities of daily living as well as pain at nighttime. He  has failed conservative treatment. He is now indicated for a left total knee  replacement.   HOSPITAL COURSE:  A 76 year old white male admitted June 09, 2003 after  appropriate laboratory tests were obtained as well as 1 g of Vancomycin IV  on call to the operating room. He was taken to the operating room where he  underwent a left total knee replacement. He tolerated the procedure well.  Continued on Vancomycin 1 g IV q.12h. for two more doses. Heparin 5000 units  subcu q.12h. was begun until his Coumadin became therapeutic. Foley was  placed intraoperatively. Consultation with PT, OT, and rehab was made.  Ambulation and weightbearing as tolerated on the left. CPM 0-30 degrees  eight hours per day. PCA Dilaudid pump was used for postoperative pain  management. He was allowed out of bed to chair the following day. IV was  KVOed at that time. On the 12th his Foley and PCA were discontinued and his  IV was Hep-Locked. Placed on OxyContin 10 mg CR 1 p.o. q.12h. Robaxin was  ordered for spasms at 500 mg p.o./IV  q.6-8h. p.r.n. He did have some mild  hypokalemia and this was corrected with potassium supplementation. He was  given a Dulcolax suppository on the 13th for constipation. The remainder of  his hospital course was uneventful. He was discharged on the 15th to return  back to our office in follow-up. EKG read normal sinus rhythm with a left  anterior fascicular block. Cannot rule out anterior infarct.   LABORATORY DATA:  Hemoglobin 13.6, hematocrit 40%, white count 9200,  platelets 248,000. Discharge hemoglobin 10.1, hematocrit 28.9%, white count  10,300, platelets 170,000. Discharge ProTime was 21.0 with an INR of 2.4.  Preoperative chemistries:  Sodium 140, potassium 4.0, chloride 107, CO2 27,  glucose 142, BUN 11, creatinine 1.0, calcium 9.6, total protein 7.4, albumin  4.3. AST 23, ALT 25, ALP 57, total bilirubin 0.5. Discharge sodium 135,  potassium 3.7, chloride 104, CO2 27, glucose  104, BUN 10, creatinine 0.9,  calcium 8.3. Urinalysis was benign for void urine. Blood type A-positive.  Antibody history negative.   DISCHARGE MEDICATIONS:  1. Given a prescription for OxyContin CR 10 mg 1 p.o. q.12h.  2. Percocet 5/325 one tab every 3-4 hours as needed for pain.  3. Coumadin 5 mg as directed by Juliane Lack. Hold aspirin while on Coumadin.  4. Colace 100 mg twice a day over-the-counter.  5. Iron sulfate twice a day with food over-the-counter.   ACTIVITY:  As tolerated in PT.   DIET:  No restrictions.   WOUND CARE:  Keep the wound clean and dry. Cover with a dressing daily.   DISCHARGE INSTRUCTIONS:  Follow the blue instruction sheet. Follow back up  with [place] in 7-10 days for recheck evaluation.   CONDITION ON DISCHARGE:  Improved.      Oris Drone Petrarca, P.A.-C.                Loreta Ave, M.D.    BDP/MEDQ  D:  07/29/2003  T:  07/30/2003  Job:  [id number]

## 2010-10-15 NOTE — Op Note (Signed)
NAME:  Isaiah Wilson, Isaiah Wilson NO.:  [id number]   MEDICAL RECORD NO.:  [id number]                   PATIENT TYPE:  INP   LOCATION:  2864                                 FACILITY:  [facility]   PHYSICIAN:  Clydene Fake, M.D.               DATE OF BIRTH:  [birth year]   DATE OF PROCEDURE:  07/22/2003  DATE OF DISCHARGE:                                 OPERATIVE REPORT   PREOPERATIVE DIAGNOSIS:  Herniated nucleus pulposus and spondylosis, left C6-  7.   POSTOPERATIVE DIAGNOSIS:  Herniated nucleus pulposus and spondylosis, left  C6-7.   OPERATION PERFORMED:  Anterior cervical decompression, diskectomy and fusion  at C6-7 with allograft (LifeNet bone) and tether anterior cervical plate.   SURGEON:  Clydene Fake, M.D.   ASSISTANT:  Coletta Memos, M.D.   ANESTHESIA:  General endotracheal.   ESTIMATED BLOOD LOSS:  Minimal.   BLOOD REPLACED:  None.   COMPLICATIONS:  None.   INDICATIONS FOR PROCEDURE:  The patient is a 76 year old gentleman who has  had neck and left arm pain and numbness and weakness with some atrophy  developing over the left triceps.  MRI was done showing severe spondylitic  changes at C6-7 especially on the left.  Possible disk herniation there  compressing the left C7 root.  Patient brought in for decompression and  fusion.   DESCRIPTION OF PROCEDURE:  The patient was brought to the operating room and  general anesthesia induced.  The patient was placed in 10 pounds of halter  traction, prepped and draped in a sterile fashion.  Site of incision was  injected with 10 mL of 1% lidocaine with epinephrine.  Incision was then  made from the midline to the anterior border of the sternocleidomastoid  muscle on the left side and that incision taken down to the platysma and  hemostasis obtained with Bovie cauterization.  The platysma was incised with  a Bovie and blunt dissection was taken to the anterior cervical fascia of  the anterior  cervical spine.  Here a needle was placed in two interspaces.  Because of the shoulders we probably were not going to be able to see C6-7  well.  X-ray showed the top needle was at 5-6, 6-7.  The lower needle could  not determine the exact level but that was the next disk space down and it  was 6-7.  The large osteophyte seen corresponded with the MRI findings.  Needles removed.  The osteophyte at 6-7 was removed and partial diskectomy  performed with pituitary rongeurs.  The longus colli muscles were reflected  laterally on each side and distraction pins were placed in the C6 and 7  vertebral bodies and interspace distracted.  Microscope was brought in for  microdissection.  At this point diskectomy was performed with pituitary  rongeurs and curets.  A high speed drill was used to removed cartilaginous  end plates and remove  the posterior osteophytes.  Posterior ligament and  disk osteophytes removed with 1 and 2 mm Kerrison punches decompressing the  central canal and also out laterally performing foraminotomies bilaterally.  When we were finished, we had good central and lateral decompression.  The  nerve roots were decompressed bilaterally.  Nerve hook easily passed out  over the joint with no compression of the roots on either side.  Height of  the disk space was measured.  The LifeNet trial system measured to be 8 mm  and an 8 mm LifeNet bone was then tapped into place and countersunk a couple  of millimeters.  We checked behind the bone and there was plenty of room  between bone and dura bilaterally.  The distraction was removed.  The pins  removed.  Hemostasis obtained with Gelfoam and thrombin.  The weight was  removed off the traction device.  Bone was in good place firmly in place.  Tether anterior cervical plate was then placed over the anterior cervical  spine with two screws placed at C6 and two at C7.  These were tightened  down.  X-ray was obtained and we were able see that  the plate and screws  were at the 6-7 level.  Good position of all instrumentation and bone.  Hemostasis obtained with Gelfoam and Thrombin.  Gelfoam was irrigated out  except for a couple of small pieces placed in the left side of the disk  space lateral to the bone.  The wound was irrigated with antibiotic solution  and then the platysma was closed with 3-0 Vicryl interrupted sutures.  The  subcutaneous tissue was closed with the same.  Skin closed with benzoin and  Steri-Strips.  Dressing was placed.  The patient was placed in a soft  cervical collar, awakened from anesthesia and transferred to recovery room  in stable condition.                                               Clydene Fake, M.D.    JRH/MEDQ  D:  07/22/2003  T:  07/22/2003  Job:  [id number]

## 2010-10-15 NOTE — Op Note (Signed)
NAME:  Isaiah Wilson, FEDERICI NO.:  [id number]   MEDICAL RECORD NO.:  [id number]          PATIENT TYPE:  INP   LOCATION:  2899                         FACILITY:  [facility]   PHYSICIAN:  Clydene Fake, M.D.  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   POSTOPERATIVE DIAGNOSIS:  Left C6-7 foraminal stenosis and radiculopathy  with possible nonunion at C6-7.   OPERATION PERFORMED:  Left C6-7 foraminotomy, microdissection with  microscope, exploration of fusion, posterior fusion at C6-7 with Helios  allograft substitute.   SURGEON:  Clydene Fake, M.D.   ASSISTANT:  Coletta Memos, M.D.   ANESTHESIA:  General endotracheal tube.   ESTIMATED BLOOD LOSS:  Minimal.   BLOOD REPLACED:  None.   DRAINS:  None.   COMPLICATIONS:  None.   INDICATIONS FOR PROCEDURE:  The patient is a 76 year old gentleman who  underwent ACF at C6-7 11 months ago.  Over the last six months she has  developed some progressive neuropathic pain and radicular symptoms in the  left C7 root.  Work-up was done showing  6-7 femoral stenosis residual,  possible nonunion.  The patient brought in for exploration and fusion and to  perform a posterior decompression of the C7 root on the left.   DESCRIPTION OF PROCEDURE:  The patient was brought to the operating room and  general anesthesia induced.  The patient was placed in head pins and placed  in a prone position on rolls and all pressure points padded.  The patient  was then prepped and draped in sterile fashion.  Needle was placed in the  lower cervical spine.  Fluoroscopic imaging showed we were pointing toward  the 6-7 level.  Incision was then made centered where the needle was  positioned in the midline of the lower cervical spine after injecting the  area with 10 mL of 1% lidocaine with epinephrine.  Incision was taken down  to the fascia and hemostasis obtained with Bovie cauterization.  Incised the  fascia and a midline dissection down to the spinous process on the left  side.  Subperiosteal dissection was done over two spinous processes out to  the facet.  Marker was placed in the facet joints and then fluoroscopic  imaging was used and showed that we were at the C6-7 level.  Could see the  anterior plate with screws in 6 and 7; the marker was in between the screws  that were in the 6 and 7 vertebral bodies.  We continued dissection, a self-  retaining retractor was placed.  Microscope was brought in for  microdissection.  A high speed drill was used to start a lateral laminectomy  and foraminotomy at the C6-7 level.  This was continued with the 1 and 2 mm  Kerrison punches as we decompressed over the 7 root.  We bipolared the  vascular sheath and then cut it, further decompressing the C7 roots.  When  we were finished, we had good decompression in the lateral central canal and  C7 nerve root going out its foramen.  About half the facet was removed at  that level to get this decompression.  There was a slight wiggle in the C6  bones.  It was thought to be somewhat fused but not completely.  It was  decided not to put any instrumentation in, but we did drill out the facet on  the left side on the left side at 6 and 7, roughed up  the bone to get some  bone bleeding to get down to good cancellous bone with the curets and a high  speed drill.  Some of this bone from the local area was put in this area  plus Helios was packed into the facet joint and placed over the facet joint  at that level.  Protecting the nerve root and lateral canal with a Gelfoam,  so this Helios would get in towards the nerve root.  Retractors were  removed.  Hemostasis obtained with bipolar cauterization.  We had good  hemostasis and fascia closed with 0 Vicryl  interrupted suture, the  subcutaneous tissue closed with 0, 2-0 and 3-0 Vicryl interrupted suture.  Skin closed with benzoin and Steri-Strips.  Dry sterile dressing was placed.  Patient was placed back in supine position, removed from the head pins,  awakened from anesthesia and transferred to recovery room in stable  condition.       JRH/MEDQ  D:  06/07/2004  T:  06/07/2004  Job:  [id number]

## 2010-10-15 NOTE — Discharge Summary (Signed)
NAME:  Isaiah Wilson, Isaiah Wilson NO.:  [id number]   MEDICAL RECORD NO.:  [id number]          PATIENT TYPE:  INP   LOCATION:  1513                         FACILITY:  [facility]   PHYSICIAN:  Ollen Gross, M.D.    DATE OF BIRTH:  18-Mar-1935   DATE OF ADMISSION:  07/26/2006  DATE OF DISCHARGE:  07/29/2006                               DISCHARGE SUMMARY   ADMITTING DIAGNOSES:  1. Osteoarthritis, right knee.  2. History of pneumonia.  3. Hypertension.  4. Hiatal hernia.  5. Reflux disease.  6. History of urinary tract infections.   DISCHARGE DIAGNOSES:  1. Osteoarthritis, right knee, status post right total knee      arthroplasty.  2. History of pneumonia.  3. Hypertension.  4. Hiatal hernia.  5. Reflux disease.  6. History of urinary tract infections.   PROCEDURE:  Right total knee arthroplasty.   SURGEON:  Dr. Lequita Halt.   ASSISTANT:  Alexzandrew Perkins PA-C.   ANESTHESIA:  General.   CONSULTATIONS:  None.   BRIEF HISTORY:  Isaiah Wilson is a 76 year old male with end-stage  arthritis of right knee with intractable pain.  He has failed  nonoperative management and now presents for total knee.   LABORATORY DATA:  Preop CBC:  Hemoglobin of 14.0, hematocrit of 40.3.  Postop hemoglobin 11.4, drifted down to 11.1 and back up.  Last H&H 11.2  and 32.1.  PT/PTT preop 13.2 and 31, respectively.  INR 1.0.  Serial pro  times followed.  Last PT/INR 29.8/2.7.  Chem panel on admission all  within normal limits.  Serial BMETs are followed.  Electrolytes remained  within normal limits.  Urinalysis, preop, negative.  Postop UA:  Moderate hemoglobin, 0-2 red cells, hyaline casts, positive protein,  otherwise negative.  Blood group type A positive.  Urine culture:  No  growth   Chest X-Ray, August 17,2007:  Borderline cardiac size.  No evidence of  active chest disease.   EKG, January 13, 2006:  Irregular sinus bradycardia, left axis deviation,  possible old infarct, low-voltage  QRS in precordial leads confirmed.   HOSPITAL COURSE:  The patient was admitted to [facility],  tolerated the procedure well, later was transferred from the recovery  room to the orthopedic floor; started on PCA and p.o. analgesic pain  control following surgery.  Actually did improve well.  On the morning  of day, had excellent urinary output.  Hemovac drain was discontinued;  resumed his home meds for blood pressure and reflux.  He had a history  of UTIs and we had sent off a urine before the Foley was removed on day  2; started to get up out of bed on day 1.   By day 2, dressing was changed; incision looked good; excellent urinary  output; was doing very well from a therapy standpoint.  Patient got up  and walked about 75 feet; continued to progress well and was up  ambulating approximately about 200 feet by the following day of postop  day 3.  He was weaned off his PCA over to p.o. meds.  Arrangements were  made.  He was seen on rounds on the weekend coverage on July 29, 2006,  tolerating meds and was discharged home.   DISCHARGE PLAN:  1. Patient is discharged home on July 29, 2006.  2. Discharge diagnoses:  Please see above.  3. Discharge meds:  Coumadin, Percocet, Robaxin.  4. Diet:  Resume home diet.  5. Activity:  Weightbearing as tolerated.  Total knee protocol.  Home      PT and home nursing.  6. Followup:  Two weeks.   DISPOSITION:  Home.   CONDITION UPON DISCHARGE:  Improving.      Alexzandrew L. Julien Girt, P.A.      Ollen Gross, M.D.  Electronically Signed    ALP/MEDQ  D:  08/22/2006  T:  08/22/2006  Job:  [id number]   cc:   Ollen Gross, M.D.  Fax: [phone]   Lonzo Cloud. Kriste Basque, MD  [address]  [place]  [place] [address]

## 2010-10-15 NOTE — Op Note (Signed)
NAME:  Isaiah Wilson, Isaiah Wilson NO.:  [id number]   MEDICAL RECORD NO.:  [id number]                   PATIENT TYPE:  INP   LOCATION:  5003                                 FACILITY:  [facility]   PHYSICIAN:  Loreta Ave, M.D.              DATE OF BIRTH:  [birth year]   DATE OF PROCEDURE:  06/09/2003  DATE OF DISCHARGE:                                 OPERATIVE REPORT   PREOPERATIVE DIAGNOSIS:  End-stage degenerative arthritis, left knee with  varus alignment and mild flexion contracture.   POSTOPERATIVE DIAGNOSIS:  End-stage degenerative arthritis, left knee with  varus alignment and mild flexion contracture.   OPERATION:  Left total knee replacement with Osteonix prosthesis. Soft  tissue balancing with medial capsular release. Cemented posterior stabilizer  #9 femoral component. Cemented #9 tibial component. A 12-mm posterior  stabilizer flex polyethylene insert. Cemented recess 30-mm patellar  component.   SURGEON:  Loreta Ave, M.D.   ASSISTANT:  Arlys John D. Petrarca, P.A.-C.   ANESTHESIA:  General.   ESTIMATED BLOOD LOSS:  Minimal.   TOURNIQUET TIME:  1 hour and 20 minutes.   SPECIMENS:  Excised bone and soft tissue. No cultures.   COMPLICATIONS:  None.   DRESSING:  Soft compressive with knee immobilizer.   DRAINS:  Hemovac x2.   DESCRIPTION OF PROCEDURE:  The patient was brought to the operating room and  placed on the operating table in the supine position. After adequate  anesthesia had been obtained the left knee was examined. Alignment with 5  degrees of varus correctable to neutral. Stable ligaments. Very mild flexion  contracture, further  flexion on bone 110 degrees. The tourniquet was  applied and prepped and draped in the usual sterile fashion. Exsanguinated  with elevation and Esmarch. The tourniquet was inflated to 350 mmHg.   A straight incision above the patella down to the tibial tubercle. A medial  peripatellar arthrotomy.  Grade 4 changes throughout. Remnants of menisci,  cruciate ligaments, loose bodies, spurs, all removed. Medial capsular  release. The distal femur was exposed.   The intramedullary guide was placed. Distal cut set at 5 degrees of valgus,  removing 10 mm. Sized to a #9 component. The jig was put in place.  Definitive cuts were made. The tibial spine was removed with a saw. The  intramedullary guide was placed.   The proximal cut 5 degree posterior  slope cut, removing 4 to 5 mm off the  deficient medial side. The patella was sized using the drill for a 30-mm  component. The trial was put in place throughout. A #9 on the femur, a #11  on the tibial and a 30-mm on the patella. With the 12-mm insert, I had full  extension, full flexion, a nicely balanced knee without liftoff during  flexion. The tibial was marked for appropriate rotation and then hand  reamed. All trials were removed.   Copious irrigation with  a pulse irrigating device. All recesses were  examined. All loose bodies and spurs were removed. The cement was prepared.  It was placed on all components which were firmly seated with the excess  cement removed. Polyethylene attached to the tibia and the knee reduced.  Full extension, full flexion, good patellofemoral tracking, nicely balanced  knee with good stability of flexion and extension.   Once the cement had hardened, the knee was reexamined again with good motion  and stability. A Hemovac was placed and brought out through a separate stab  wound. The arthrotomy was closed with #1 Vicryl. The skin and subcutaneous  tissue were closed with Vicryl and staples. The margins of the wound were  injected with Marcaine as was the knee and the Hemovacs were clamped. A  sterile compressive dressing was applied.   The tourniquet was deflated and removed. A knee immobilizer was applied.  Anesthesia was reversed. The patient was brought to the recovery room. He  tolerated  the surgery  well without complications.                                               Loreta Ave, M.D.    DFM/MEDQ  D:  06/09/2003  T:  06/09/2003  Job:  [id number]

## 2010-10-15 NOTE — Op Note (Signed)
[facility]  Patient:    Isaiah Wilson, Isaiah Wilson Visit Number: [id number] MRN: [id number]          Service Type: DSU Location: [facility] Attending Physician:  Colbert Ewing Dictated by:   Loreta Ave, M.D. Proc. Date: 06/07/01 Admit Date:  06/07/2001                             Operative Report  PREOPERATIVE DIAGNOSES:  Degenerative arthritis and medial meniscus tear, left knee.  POSTOPERATIVE DIAGNOSES:  Degenerative arthritis and medial meniscus tear, left knee, with grade 3 and some focal mild grade 4 changes, all three compartments.  PROCEDURES: 1. Left knee examination under anesthesia, arthroscopy, with extensive    resection, medial meniscus tears. 2. Chondroplasty, all three compartments. 3. Removal of hypertrophic synovitis and chondral loose bodies.  SURGEON:  Loreta Ave, M.D.  ASSISTANT:  Arlys John D. Petrarca, P.A.-C.  ANESTHESIA:  Knee block with sedation.  SPECIMENS:  None.  CULTURES:  None.  COMPLICATIONS:  None.  DRESSING:  Soft compressive.  DESCRIPTION OF PROCEDURE:  Patient brought to the operating room and placed on the operating table in supine position.  After adequate anesthesia had been obtained, left knee examined.  Good motion, reasonable stability. Patellofemoral and tibiofemoral crepitus but not extreme.  Positive medial McMurrays.  Tourniquet and leg holder applied, leg prepped and draped in the usual sterile fashion.  Three portals created, one superolateral, one each medial and lateral parapatellar.  Inflow catheter introduced, the knee distended, arthroscope introduced, knee inspected.  Extensive grade 3 changes, entire patellofemoral joint, with chondral loose bodies and flaps.  All of this debrided to a stable surface.  Most of the medial trochlea was exposed down to grade 4.   Reasonable tracking.  Hypertrophic synovitis removed. Cruciate ligaments intact.  Medial compartment revealed  extensive grade 3 changes, especially on the femur.  Chondral loose bodies removed.  Marked complex tearing, entire posterior half medial meniscus.  Saucerized out, leaving a little bit of the rim of the back tapered into the remaining meniscus and salvaging the anterior half.  All loose bodies were removed. Reasonable cartilage on the tibia, but again marked grade 3 and even some focal grade 4 changes on the femur.  Lateral compartment exposed.  Meniscus intact.  Relatively extensive, not as deep grade 3 changes over most of the femur.  Uneven surfaces, loose bodies, and flaps all removed.  At completion, the entire knee examined.  Care was taken to be sure that we had removed all synovitis and chondral loose bodies.  Instruments and fluid removed.  Portals and knee injected with Marcaine.  Portals closed with 4-0 nylon.  Sterile compressive dressing applied.  Anesthesia reversed.  Brought to the recovery room.  Tolerated the surgery well with no complications. Dictated by:   Loreta Ave, M.D. Attending Physician:  Colbert Ewing DD:  06/07/01 TD:  06/08/01 Job: [id number]

## 2010-10-15 NOTE — H&P (Signed)
NAME:  Isaiah Wilson, BEGAY NO.:  [id number]   MEDICAL RECORD NO.:  [id number]          PATIENT TYPE:  INP   LOCATION:  NA                           FACILITY:  [facility]   PHYSICIAN:  Ollen Gross, M.D.    DATE OF BIRTH:  12-20-1934   DATE OF ADMISSION:  07/26/2006  DATE OF DISCHARGE:                              HISTORY & PHYSICAL   DATE OF OFFICE VISIT/HISTORY AND PHYSICAL:  July 11, 2006.   DATE OF ADMISSION:  July 26, 2006.   CHIEF COMPLAINT:  Right knee pain.   HISTORY OF PRESENT ILLNESS:  Patient is a 76 year old male who has been  seen by Dr. Despina Hick for ongoing right knee pain that has progressed  throughout the past several years, who has been treated conservatively  in the past, despite conservative measures and viscus supplementation.  He continues to have significant pain.  It is felt he has reached a  point where he would like to have something permanent done.  Risks and  benefits of knee replacement have been discussed, and he elects to  proceed with surgery.  He has been seen preoperatively by Dr. Kriste Basque and  felt cleared for surgery.   ALLERGIES:  No known drug allergies.   CURRENT MEDICATIONS:  Toprol XL, doxazosin, Nexium, fexofenadine.  He  does take a weekly allergy shot.  He is on supplements, including  lysine, zinc, Centrum vitamin, Tylenol Arthritis, and baby aspirin.   PAST MEDICAL HISTORY:  1. History of pneumonia.  2. Hypertension.  3. Hiatal hernia.  4. Reflux disease.  5. History of UTIs.   PAST SURGICAL HISTORY:  1. Left knee replacement in 2005.  2. Appendectomy.  3. Septoplasty x2.  4. Left foot surgery.  5. Gallbladder surgery.  6. Tonsils.   SOCIAL HISTORY:  Married.  Retired [employer] examiner for 27 years.  Nonsmoker.  No alcohol.  One child.  Wife will be assisting with care  after surgery.   FAMILY HISTORY:  Father deceased with heart disease.  Mother deceased  with cancer.  Grandmother deceased with  diabetes.   REVIEW OF SYSTEMS:  GENERAL:  No fevers, chills, night sweats.  NEURO:  No seizures, syncope, paralysis.  RESPIRATORY:  No shortness of breath,  productive cough, or hemoptysis.  CARDIOVASCULAR:  No chest pain,  angina, or orthopnea.  GI:  No nausea, vomiting, diarrhea, or  constipation.  GU:  A little bit of nocturia.  No dysuria or hematuria.  MUSCULOSKELETAL:  Right knee.   PHYSICAL EXAMINATION:  VITAL SIGNS:  Pulse 60, respirations 12, blood  pressure 126/68.  GENERAL:  A 76 year old white male who is well-developed and well-  nourished in no acute distress.  Alert, oriented and cooperative.  Very  pleasant.  A good historian.  He is accompanied by his wife.  HEENT:  Normocephalic and atraumatic.  Pupils are round and reactive.  Oropharynx is clear.  EOMs intact.  CHEST:  Clear.  HEART:  Regular rate and rhythm.  No murmur.  S1 and S2 noted.  ABDOMEN:  Soft, slightly round.  Bowel sounds present.  RECTAL/BREASTS/GENITALIA:  Not  done.  Not pertinent to the present  illness.  EXTREMITIES:  Right knee:  No effusion.  Marked crepitus.  Range of  motion 10-115, 10 more medial than lateral.  No instability.   IMPRESSION:  1. Osteoarthritis, right knee.  2. History of pneumonia.  3. Hypertension.  4. Hiatal hernia.  5. Reflux disease.  6. History of urinary tract infections.   PLAN:  Patient admitted to [facility] to undergo a right total  knee arthroplasty.  Surgery will be performed by Dr. Trudee Grip.      Isaiah Wilson, P.A.      Ollen Gross, M.D.  Electronically Signed    ALP/MEDQ  D:  07/25/2006  T:  07/26/2006  Job:  [id number]   cc:   Lonzo Cloud. Kriste Basque, MD  [address]  [place]  [place] [address]

## 2011-01-07 ENCOUNTER — Telehealth: Payer: Self-pay | Admitting: Pulmonary Disease

## 2011-01-07 MED ORDER — TRAMADOL HCL 50 MG PO TABS: ORAL_TABLET | ORAL | Status: DC

## 2011-01-07 NOTE — Telephone Encounter (Signed)
Refill sent. Pt aware.Dafne Nield, CMA  

## 2011-01-07 NOTE — Telephone Encounter (Signed)
Per previous phone message from today, this rx was supposed to be sent in but looks like it may have been printed instead.  Called [facility], spoke with Jasmine December.  Gave VO for this and she verbalized understanding.

## 2011-01-12 ENCOUNTER — Other Ambulatory Visit: Payer: Self-pay | Admitting: *Deleted

## 2011-01-12 MED ORDER — TRAMADOL HCL 50 MG PO TABS: ORAL_TABLET | ORAL | Status: DC

## 2011-02-09 ENCOUNTER — Ambulatory Visit (INDEPENDENT_AMBULATORY_CARE_PROVIDER_SITE_OTHER): Payer: Medicare Other | Admitting: Pulmonary Disease

## 2011-02-09 ENCOUNTER — Ambulatory Visit (INDEPENDENT_AMBULATORY_CARE_PROVIDER_SITE_OTHER)
Admission: RE | Admit: 2011-02-09 | Discharge: 2011-02-09 | Disposition: A | Discharge status: Home / Self Care | Payer: Medicare Other | Source: Ambulatory Visit | Attending: Pulmonary Disease | Admitting: Pulmonary Disease

## 2011-02-09 ENCOUNTER — Other Ambulatory Visit (INDEPENDENT_AMBULATORY_CARE_PROVIDER_SITE_OTHER): Payer: Medicare Other

## 2011-02-09 DIAGNOSIS — R202 Paresthesia of skin: Secondary | ICD-10-CM

## 2011-02-09 DIAGNOSIS — E785 Hyperlipidemia, unspecified: Secondary | ICD-10-CM

## 2011-02-09 DIAGNOSIS — E663 Overweight: Secondary | ICD-10-CM

## 2011-02-09 DIAGNOSIS — K2270 Barrett's esophagus without dysplasia: Secondary | ICD-10-CM

## 2011-02-09 DIAGNOSIS — K219 Gastro-esophageal reflux disease without esophagitis: Secondary | ICD-10-CM

## 2011-02-09 DIAGNOSIS — I10 Essential (primary) hypertension: Secondary | ICD-10-CM

## 2011-02-09 DIAGNOSIS — Z87898 Personal history of other specified conditions: Secondary | ICD-10-CM

## 2011-02-09 DIAGNOSIS — M1990 Unspecified osteoarthritis, unspecified site: Secondary | ICD-10-CM

## 2011-02-09 DIAGNOSIS — N400 Enlarged prostate without lower urinary tract symptoms: Secondary | ICD-10-CM

## 2011-02-09 DIAGNOSIS — J309 Allergic rhinitis, unspecified: Secondary | ICD-10-CM

## 2011-02-09 DIAGNOSIS — R209 Unspecified disturbances of skin sensation: Secondary | ICD-10-CM

## 2011-02-09 DIAGNOSIS — F411 Generalized anxiety disorder: Secondary | ICD-10-CM

## 2011-02-09 LAB — CBC WITH DIFFERENTIAL/PLATELET
Basophils Absolute: 0 10*3/uL (ref 0.0–0.1)
Eosinophils Absolute: 0.2 10*3/uL (ref 0.0–0.7)
HCT: 40 % (ref 39.0–52.0)
Lymphs Abs: 2.1 10*3/uL (ref 0.7–4.0)
MCV: 90.1 fl (ref 78.0–100.0)
Monocytes Absolute: 0.6 10*3/uL (ref 0.1–1.0)
Neutrophils Relative %: 57.8 % (ref 43.0–77.0)
Platelets: 150 10*3/uL (ref 150.0–400.0)
RDW: 14.3 % (ref 11.5–14.6)
WBC: 7 10*3/uL (ref 4.5–10.5)

## 2011-02-09 LAB — BASIC METABOLIC PANEL
BUN: 12 mg/dL (ref 6–23)
Chloride: 109 mEq/L (ref 96–112)
GFR: 85.51 mL/min (ref >60.00)
Glucose, Bld: 96 mg/dL (ref 70–99)
Potassium: 4.2 mEq/L (ref 3.5–5.1)
Sodium: 142 mEq/L (ref 135–145)

## 2011-02-09 LAB — LIPID PANEL
Cholesterol: 166 mg/dL (ref 0–200)
LDL Cholesterol: 108 mg/dL — ABNORMAL HIGH (ref 0–99)
VLDL: 14.6 mg/dL (ref 0.0–40.0)

## 2011-02-09 LAB — HEPATIC FUNCTION PANEL
ALT: 25 U/L (ref 0–53)
AST: 30 U/L (ref 0–37)
Alkaline Phosphatase: 47 U/L (ref 39–117)
Total Bilirubin: 1 mg/dL (ref 0.3–1.2)

## 2011-02-09 LAB — TSH: TSH: 1.91 u[IU]/mL (ref 0.35–5.50)

## 2011-02-09 LAB — PSA: PSA: 3.31 ng/mL (ref 0.10–4.00)

## 2011-02-09 MED ORDER — ESOMEPRAZOLE MAGNESIUM 40 MG PO CPDR: 40.0000 mg | DELAYED_RELEASE_CAPSULE | Freq: Every day | ORAL | Status: DC

## 2011-02-09 MED ORDER — ALPRAZOLAM 0.5 MG PO TABS: ORAL_TABLET | ORAL | Status: DC

## 2011-02-09 MED ORDER — DOXAZOSIN MESYLATE 8 MG PO TABS: 8.0000 mg | ORAL_TABLET | Freq: Every day | ORAL | Status: DC

## 2011-02-09 MED ORDER — TRAMADOL HCL 50 MG PO TABS: ORAL_TABLET | ORAL | Status: DC

## 2011-02-09 NOTE — Progress Notes (Signed)
Subjective:    Patient ID: Isaiah Wilson, male    DOB: November 21, 1934, 76 y.o.   MRN: [id number]  HPI 76 y/o WM here for a follow up visit...  he has multiple medical problems as noted below...   Followed for general medical purposes w/ hx HBP, Hyperlipidemia, Barrett's esoph, ischemic colitis in 2008, BPH, and DJD...  ~  June 22, 2009:  he had a good 50mo- just c/o some burning pain in left groin area... ?loose ing ring/ ing hernia but not bad enough for surg he says- try Lyrica 50-100 Qhs first... needs refill Rx for 2011 & due for TDAP today.  ~  November 27, 2009:  he's been doing well x left foot> surg x2 by Podiatrist for bunion, & toe shortening... we discussed poss 2nd opinion w/ Orthopedist- DrBednarz, he will decide... otherw feeling well- no new complaints or concerns...   ~  July 30, 2010:  he had additional right foot surg- this time by [facility] & improved, but he is requesting order for phys therapy & we will oblige... also offered Tramadol for pain as he is just using Tylenol Prn... he continues to f/u w/ DrESL for allergies & now taking Astepro, Nasonex, Claritin, & shots... BP controlled on meds & feeling well... still trying to regulate Chol w/ diet alone but weight up 9# & he will ret for FLP & consider med rx if nec... GI & GU are stable 7 he is voiding well w/o the Uroxatrol...  up to date on vaccinations.  ~  February 09, 2011:  50mo ROV & doing well overall, no new complaints or concerns; he requests refill prescriptions for 30d supplies.Marland Kitchen    HBP> BP controlled on diet + Doxazosin; BP today= 138/72 & feeling well w/o HA, CP, palpit, dyspnea, edema, etc;     HYPERLIPID> His labs are not at the goal numbers as he has refused statin meds & prefers diet alone; we reviewed diet & wt reduction targets...    Overweight> His weight hasn't changed 10# in several yrs & today weighs in at 240# which equals a BMI of 33 in his 6' frame...    GERD/ Barrett's> Followed by DrPatterson w/ EGD  Q55yrs due to Barrett's esoph; on Nexium 40mg /d & stable...    BPH> He is on the Doxazosin & states intol to Flomax; PSAs have been normal & he continues to void satis w/o problems reported...    DJD, Neuropathy> He c/o paresthesias involving his left arm from elbow down; DrRamos did NCV study 6/12 which was NEG- no neuropathy, radiculopathy, or CTS.    Anxiety>           Problem List:  ALLERGIC RHINITIS (ICD-477.9) - DrESL referred pt to [facility] for nasal polyps... on allergy shots weekly, plus Claritin, Astepro, Nasonex. ~  He reports tubes placed in his ears by [facility], ENT...  HYPERTENSION (ICD-401.9) - controlled on ASA 81mg /d, & DOXAZOSIN 8mg /d... BP=138/72 and he feels well... doesn't check BP at home but it's OK at the drug store... denies HA, fatigue, visual changes, CP, palipit, dizziness, syncope, dyspnea, edema, etc...  ~  he had a negative Cardiolite in 2001 and normal CDoppler's in 2006.  HYPERLIPIDEMIA (ICD-272.4) - on diet alone... he has repeatedly refused Statin Rx. ~  FLP 8/07 showed TChol 189, TG 200, HDL 32, LDL 117... rec- med Rx, but he preferred diet. ~  FLP 8/09 showed TChol 179, TG 94, HDL 38, LDL 122... rec- consider meds, he  refuses. ~  FLP 8/10 showed TChol 186, TG 140, HDL 37, LDL 121... better diet, get wt down. ~  FLP 7/11 showed TChol 198, TG 159, HDL 41, LDL 125... he continues to decline statin Rx. ~  FLP 3/12 showed TChol 184, TG 146, HDL 37, LDL 118 ~  FLP 9/12 showed TChol 166, TG 73, HDL 44, LDL 108  OVERWEIGHT (ICD-278.02) - we discussed diet + exercise program sufficient to lose weight. ~  weight 2/10 = 239#  ~  weight 8/10 = 244# ~  weight 1/11 = 238# ~  weight 7/11 = 240# ~  weight 3/12 = 249#... we reviewed need for diet + exercise. ~  Weight 9/12 = 240#  GERD (ICD-530.81) & BARRETTS ESOPHAGUS (ICD-530.85) - followed by DrPatterson/Brodie> on NEXIUM 40mg /d... ~  EGD 7/08 showed HH and Barrett's mucosa (no dysplasia)... ~  EGD 8/10 showed  sm hyperplastic polyps, & 5cm segment of Barrett's mucosa... f/u planned 51yrs.  Hx of ISCHEMIC COLITIS (ICD-557.9) - Hosp 5/08 w/ abd pain & lower GIB> eval revealed prob ischemic colitis... he had a f/u colonoscopy 7/08 which was normal without colitis... no recurrent symptoms to date...  BENIGN PROSTATIC HYPERTROPHY, HX OF (ICD-V13.8) - hx BPH and UTI in the past... DOXAZOSIN helps... INTOL Flomax "it affects my eyes"; added Uroxatrol 10mg /d 8/10 and improved nocturia & LTOS, he has stopped this med & states voiding well... ~  labs 8/07 showed PSA= 2.6 ~  labs 8/09 showed PSA= 2.04 ~  labs 8/10 showed PSA= 2.54 ~  labs 7/11 showed PSA= 2.76 ~  Labs 9/12 showed PSA= 3.31  DEGENERATIVE JOINT DISEASE (ICD-715.90) - S/P Right TKR 2/08 & Left TKR 2/09 DrAlusio & 2 prev neck surg by Carles Collet... he notes occas LBP as well... ~  7/11: he reports 2 operations left foot by podiatrist for bunion & toe straightening. ~  10/11: he has 3rd operation on right foot, this one from DrBednarz, & finally improved.  NEUROPATHY (ICD-355.9) - prev tried Lyrica- ?benefit... ~  C/p paresthesias in left arm & NCVs from DrRamos were neg- no neuropathy, no radiculopathy, no evid CTS...  ANXIETY (ICD-300.00) - he is under mod stress and requests ALPRAZOLAM 0.5mg - 1/2 to 1 tab Tid Prn...  Health Maintenance:  he gets the yearly flu vaccine each fall... given PNEUMOVAX ~2007... given TDAP 1/11...   Past Medical History  Diagnosis Date  . Allergic rhinitis   . Hypertension   . Hyperlipidemia   . Overweight   . GERD (gastroesophageal reflux disease)   . Barrett's esophagus   . Colitis, ischemic   . Benign prostatic hypertrophy   . DJD (degenerative joint disease)   . Neuropathy   . Anxiety     Past Surgical History  Procedure Date  . Laparoscopic cholecystectomy 1993    Dr. Luan Moore  . 2 neck surgeries 2005-2006    Dr. Georgia Dom cx decompression, diskectomy,fusion C6-7 allograft  . Right total knee  replacement 06/2006    Dr. Despina Hick  . Left total knee replacement 07/2007    Dr. Despina Hick  . Appendectomy     Outpatient Encounter Prescriptions as of 02/09/2011  Medication Sig Dispense Refill  . ALPRAZolam (XANAX) 0.5 MG tablet Take 1/2 to 1 tablet by mouth three times daily as needed for nerves       . aspirin 81 MG tablet Take 81 mg by mouth daily.        Marland Kitchen azelastine (ASTELIN) 137 MCG/SPRAY nasal spray Place 1 spray  into the nose 2 (two) times daily. Use in each nostril as directed       . doxazosin (CARDURA) 8 MG tablet Take 8 mg by mouth at bedtime.        Marland Kitchen esomeprazole (NEXIUM) 40 MG capsule Take 40 mg by mouth daily before breakfast.        . fluticasone (FLONASE) 50 MCG/ACT nasal spray Place 1 spray into the nose daily.      Marland Kitchen loratadine (CLARITIN) 10 MG tablet Take 10 mg by mouth daily.        Marland Kitchen Lysine 500 MG CAPS Take 1 capsule by mouth daily.        . Multiple Vitamins-Minerals (CENTRUM) tablet Take 1 tablet by mouth daily.        . traMADol (ULTRAM) 50 MG tablet Take 1 tablet up to three times a day as needed for pain  90 tablet  5  . zinc gluconate 50 MG tablet Take 50 mg by mouth daily.          Allergies  Allergen Reactions  . Tamsulosin     REACTION: pt states INTOL to Flomax - "it affects my eys"    Current Medications, Allergies, Past Medical History, Past Surgical History, Family History, and Social History were reviewed in [employer] record.    Review of Systems         See HPI - all other systems neg except as noted...  The patient complains of dyspnea on exertion and difficulty walking.  The patient denies anorexia, fever, weight loss, weight gain, vision loss, decreased hearing, hoarseness, chest pain, syncope, peripheral edema, prolonged cough, headaches, hemoptysis, abdominal pain, melena, hematochezia, severe indigestion/heartburn, hematuria, incontinence, muscle weakness, suspicious skin lesions, transient blindness, depression,  unusual weight change, abnormal bleeding, enlarged lymph nodes, and angioedema.     Objective:   Physical Exam     WD, Overweight, 76 y/o WM in NAD... GENERAL:  Alert & oriented; pleasant & cooperative... HEENT:  [place]/AT, EACs-clear, TMs-wnl, NOSE-clear, THROAT-clear & wnl. NECK:  Supple w/ fairROM; no JVD; normal carotid impulses w/o bruits; no thyromegaly or nodules palpated; no lymphadenopathy. CHEST:  Clear to P & A; without wheezes/ rales/ or rhonchi. HEART:  Regular Rhythm; without murmurs/ rubs/ or gallops. ABDOMEN:  Soft & nontender, w/ diastasis recti, normal bowel sounds; no organomegaly or masses detected. Groin- ?sm hernia on right, min tender, otherw neg. EXT: mod arthritic changes s/p bilat TKRs & right foot surg; no varicose veins/ venous insuffic/ or edema. NEURO:  CN's intact; no focal neuro deficits... DERM:  no lesions noted...   Assessment & Plan:   AR>  Followed by DrESL & still on allergy shots, Claritin, Nasonex, Astepro, etc from the allergist; he also sees [facility] for ENT w/ tubes in his ear...  HBP>  BP controlloed on diet + doxazosin; continue same...  HYPERLIPID>  On diet alone & numbers improved w/ wt down 9# to 240# today; keep up the good work...  Overweight>  As noted his wt is approx the same over the last several yrs & we discussed the need for fewer calories & more exercise!!!  GERD, Barrett's Esoph>  Followed by DrPatterson on Nexium daily; stable EGDs every 2-3 yrs...  BPH>  Stable on the Doxazosin (he states intol to FLomax); PSA today = 3.31...  DJD, Neck Pain, Back Pain, Neuropathy symptoms>  He is s/p bilat TKRs from DrAlusio; he's had 2 prev neck surgeries from DrHirsh; he's had several foot  operations as well; he had neg NCV of left arm 6/12 by DrRamos; he takes Ultram as needed...  Anxiety>  He has Alprazolam avail for prn use.Marland KitchenMarland Kitchen

## 2011-02-09 NOTE — Patient Instructions (Signed)
Today we updated your med list in EPIC...    We refilled your meds per request...  Today we did your follow up CXR & fasting blood work...    Please call the [facility] in a few days for your results...    Dial [id number] & when prompted enter your patient number followed by the # symbol...    Your patient number is:  [id number]#  Keep up the good work w/ diet & exercise...  Call for any problems...  Let's plan another routine follow up in 6 months, sooner if needed.Marland KitchenMarland Kitchen

## 2011-02-12 ENCOUNTER — Encounter: Payer: Self-pay | Admitting: Pulmonary Disease

## 2011-03-17 LAB — CBC
MCHC: 33.8
MCHC: 33.9
MCV: 84.3
Platelets: 176
Platelets: 182
RBC: 4.35
RDW: 14.3 — ABNORMAL HIGH
WBC: 11.8 — ABNORMAL HIGH

## 2011-03-17 LAB — DIFFERENTIAL
Basophils Relative: 0
Eosinophils Absolute: 0.2
Monocytes Relative: 7
Neutro Abs: 8.8 — ABNORMAL HIGH
Neutrophils Relative %: 74

## 2011-03-17 LAB — BASIC METABOLIC PANEL
BUN: 4 — ABNORMAL LOW
Calcium: 8.6
Creatinine, Ser: 0.84
GFR calc Af Amer: >60

## 2011-08-09 ENCOUNTER — Ambulatory Visit (INDEPENDENT_AMBULATORY_CARE_PROVIDER_SITE_OTHER): Payer: Medicare Other | Admitting: Pulmonary Disease

## 2011-08-09 ENCOUNTER — Encounter: Payer: Self-pay | Admitting: Pulmonary Disease

## 2011-08-09 VITALS — BP 140/78 | HR 56 | Temp 96.8°F | Ht 71.0 in | Wt 244.0 lb

## 2011-08-09 DIAGNOSIS — G589 Mononeuropathy, unspecified: Secondary | ICD-10-CM

## 2011-08-09 DIAGNOSIS — M1990 Unspecified osteoarthritis, unspecified site: Secondary | ICD-10-CM

## 2011-08-09 DIAGNOSIS — E785 Hyperlipidemia, unspecified: Secondary | ICD-10-CM

## 2011-08-09 DIAGNOSIS — K219 Gastro-esophageal reflux disease without esophagitis: Secondary | ICD-10-CM

## 2011-08-09 DIAGNOSIS — Z87898 Personal history of other specified conditions: Secondary | ICD-10-CM

## 2011-08-09 DIAGNOSIS — F411 Generalized anxiety disorder: Secondary | ICD-10-CM

## 2011-08-09 DIAGNOSIS — E663 Overweight: Secondary | ICD-10-CM

## 2011-08-09 DIAGNOSIS — I10 Essential (primary) hypertension: Secondary | ICD-10-CM

## 2011-08-09 DIAGNOSIS — K2270 Barrett's esophagus without dysplasia: Secondary | ICD-10-CM

## 2011-08-09 MED ORDER — FLUTICASONE PROPIONATE 50 MCG/ACT NA SUSP: 1.0000 | Freq: Every day | NASAL | Status: DC

## 2011-08-09 MED ORDER — ALPRAZOLAM 0.5 MG PO TABS: ORAL_TABLET | ORAL | Status: DC

## 2011-08-09 MED ORDER — PREDNISONE 5 MG PO KIT: PACK | ORAL | Status: DC

## 2011-08-09 MED ORDER — DOXAZOSIN MESYLATE 8 MG PO TABS: 8.0000 mg | ORAL_TABLET | Freq: Every day | ORAL | Status: DC

## 2011-08-09 MED ORDER — TRAMADOL HCL 50 MG PO TABS: ORAL_TABLET | ORAL | Status: DC

## 2011-08-09 MED ORDER — ESOMEPRAZOLE MAGNESIUM 40 MG PO CPDR: 40.0000 mg | DELAYED_RELEASE_CAPSULE | Freq: Every day | ORAL | Status: DC

## 2011-08-09 NOTE — Progress Notes (Addendum)
Subjective:    Patient ID: Isaiah Wilson, male    DOB: June 17, 1934, 77 y.o.   MRN: [id number]  HPI 77 y/o WM here for a follow up visit...  he has multiple medical problems as noted below...   Followed for general medical purposes w/ hx HBP, Hyperlipidemia, Barrett's esoph, ischemic colitis in 2008, BPH, and DJD...  ~  June 22, 2009:  he had a good 67mo- just c/o some burning pain in left groin area... ?loose ing ring/ ing hernia but not bad enough for surg he says- try Lyrica 50-100 Qhs first... needs refill Rx for 2011 & due for TDAP today.  ~  November 27, 2009:  he's been doing well x left foot> surg x2 by Podiatrist for bunion, & toe shortening... we discussed poss 2nd opinion w/ Orthopedist- DrBednarz, he will decide... otherw feeling well- no new complaints or concerns...   ~  July 30, 2010:  he had additional right foot surg- this time by [facility] & improved, but he is requesting order for phys therapy & we will oblige... also offered Tramadol for pain as he is just using Tylenol Prn... he continues to f/u w/ DrESL for allergies & now taking Astepro, Nasonex, Claritin, & shots... BP controlled on meds & feeling well... still trying to regulate Chol w/ diet alone but weight up 9# & he will ret for FLP & consider med rx if nec... GI & GU are stable 7 he is voiding well w/o the Uroxatrol...  up to date on vaccinations.  ~  February 09, 2011:  67mo ROV & doing well overall, no new complaints or concerns; he requests refill prescriptions for 30d supplies.Marland Kitchen    HBP> BP controlled on diet + Doxazosin; BP today= 138/72 & feeling well w/o HA, CP, palpit, dyspnea, edema, etc;     HYPERLIPID> His labs are not at the goal numbers as he has refused statin meds & prefers diet alone; we reviewed diet & wt reduction targets...    Overweight> His weight hasn't changed 10# in several yrs & today weighs in at 240# which equals a BMI of 33 in his 6' frame...    GERD/ Barrett's> Followed by DrPatterson w/ EGD  Q47yrs due to Barrett's esoph; on Nexium 40mg /d & stable...    BPH> He is on the Doxazosin & states intol to Flomax; PSAs have been normal & he continues to void satis w/o problems reported...    DJD, Neuropathy> He c/o paresthesias involving his left arm from elbow down; DrRamos did NCV study 6/12 which was NEG- no neuropathy, radiculopathy, or CTS.    Anxiety> he has Alprazolam for prn use...  ~  August 09, 2011:  67mo ROV & [name] is stable just c/o arthritis "all over" which he treats w/ OTC analgesics, Tramadol, and warm soaks, etc;  He had eval by DrRamos w/ shots in is back but they didn't help AND they cost $4K, he says;  DrAlusio follows his bilat knee replacement status but unfortunately he still has pain R>L; we reviewed the importance of wt reduction & exercise... Today we reviewed his prob list, meds, prev XRays & labs> see prob list update below>>   Problem List:    << PROBLEM LIST UPDATED 08/09/11 >>  ALLERGIC RHINITIS (ICD-477.9) - DrESL referred pt to [facility] for nasal polyps... on allergy shots weekly, plus Claritin, Astepro, Nasonex. ~  He reports tubes placed in his ears by [facility], ENT...  HYPERTENSION (ICD-401.9) - controlled on ASA 81mg /d, &  DOXAZOSIN 8mg /d... ~  he had a negative Cardiolite in 2001 and normal CDoppler's in 2006. ~  9/12:  BP=138/72 and he feels well; doesn't check BP at home but it's OK at the drug store; denies HA, fatigue, visual changes, CP, palipit, dizziness, syncope, dyspnea, edema, etc...  ~  3/13:  BP= 140/78 & he remains asymptomatic> denies HA, CP, palpit, SOB, edema...  HYPERLIPIDEMIA (ICD-272.4) - on diet alone... he has repeatedly refused Statin Rx. ~  FLP 8/07 showed TChol 189, TG 200, HDL 32, LDL 117... rec- med Rx, but he preferred diet. ~  FLP 8/09 showed TChol 179, TG 94, HDL 38, LDL 122... rec- consider meds, he refuses. ~  FLP 8/10 showed TChol 186, TG 140, HDL 37, LDL 121... better diet, get wt down. ~  FLP 7/11 showed TChol 198, TG 159,  HDL 41, LDL 125... he continues to decline statin Rx. ~  FLP 3/12 showed TChol 184, TG 146, HDL 37, LDL 118 ~  FLP 9/12 showed TChol 166, TG 73, HDL 44, LDL 108  OVERWEIGHT (ICD-278.02) - we discussed diet + exercise program sufficient to lose weight. ~  weight 2/10 = 239#  ~  weight 8/10 = 244# ~  weight 1/11 = 238# ~  weight 7/11 = 240# ~  weight 3/12 = 249#... we reviewed need for diet + exercise. ~  Weight 9/12 = 240# ~  Weight 3/13 = 244#  GERD (ICD-530.81) & BARRETTS ESOPHAGUS (ICD-530.85) - followed by DrPatterson/Brodie> on NEXIUM 40mg /d... ~  EGD 7/08 showed HH and Barrett's mucosa (no dysplasia)... ~  EGD 8/10 showed sm hyperplastic polyps, & 5cm segment of Barrett's mucosa... f/u planned 62yrs.  Hx of ISCHEMIC COLITIS (ICD-557.9) - Hosp 5/08 w/ abd pain & lower GIB> eval revealed prob ischemic colitis... he had a f/u colonoscopy 7/08 which was normal without colitis... no recurrent symptoms to date...  BENIGN PROSTATIC HYPERTROPHY, HX OF (ICD-V13.8) - hx BPH and UTI in the past... DOXAZOSIN helps... INTOL Flomax "it affects my eyes"; added Uroxatrol 10mg /d 8/10 and improved nocturia & LTOS, he has stopped this med & states voiding well... ~  labs 8/07 showed PSA= 2.6 ~  labs 8/09 showed PSA= 2.04 ~  labs 8/10 showed PSA= 2.54 ~  labs 7/11 showed PSA= 2.76 ~  Labs 9/12 showed PSA= 3.31  DEGENERATIVE JOINT DISEASE (ICD-715.90) - S/P Right TKR 2/08 & Left TKR 2/09 DrAlusio & 2 prev neck surg by [facility] Col[facility]... he notes incr LBP as well... ~  7/11: he reports 2 operations left foot by podiatrist for bunion & toe straightening. ~  10/11: he has 3rd operation on right foot, this one from DrBednarz, & finally improved. ~  2012:  He reports shots in his back by DrRamos didn't help...  NEUROPATHY (ICD-355.9) - prev tried Lyrica- ?benefit... ~  C/p paresthesias in left arm & NCVs from DrRamos were neg- no neuropathy, no radiculopathy, no evid CTS...  ANXIETY (ICD-300.00) - he is under  mod stress and requests ALPRAZOLAM 0.5mg - 1/2 to 1 tab Tid Prn...  Health Maintenance:  he gets the yearly flu vaccine each fall... given PNEUMOVAX ~2007... given TDAP 1/11...   Past Medical History  Diagnosis Date  . Allergic rhinitis   . Hypertension   . Hyperlipidemia   . Overweight   . GERD (gastroesophageal reflux disease)   . Barrett's esophagus   . Colitis, ischemic   . Benign prostatic hypertrophy   . DJD (degenerative joint disease)   . Neuropathy   .  Anxiety     Past Surgical History  Procedure Date  . Laparoscopic cholecystectomy 1993    Dr. Luan Moore  . 2 neck surgeries 2005-2006    Dr. Georgia Dom cx decompression, diskectomy,fusion C6-7 allograft  . Right total knee replacement 06/2006    Dr. Despina Hick  . Left total knee replacement 07/2007    Dr. Despina Hick  . Appendectomy     Outpatient Encounter Prescriptions as of 08/09/2011  Medication Sig Dispense Refill  . aspirin 81 MG tablet Take 81 mg by mouth daily.        Marland Kitchen doxazosin (CARDURA) 8 MG tablet Take 1 tablet (8 mg total) by mouth at bedtime.  30 tablet  5  . esomeprazole (NEXIUM) 40 MG capsule Take 1 capsule (40 mg total) by mouth daily before breakfast.  30 capsule  5  . fluticasone (FLONASE) 50 MCG/ACT nasal spray Place 1 spray into the nose daily.      Marland Kitchen Lysine 500 MG CAPS Take 1 capsule by mouth daily.        . Multiple Vitamins-Minerals (CENTRUM) tablet Take 1 tablet by mouth daily.        . traMADol (ULTRAM) 50 MG tablet Take 1 tablet up to three times a day as needed for pain  90 tablet  5  . zinc gluconate 50 MG tablet Take 50 mg by mouth daily.        Marland Kitchen ALPRAZolam (XANAX) 0.5 MG tablet Take 1/2 to 1 tablet by mouth three times daily as needed for nerves  90 tablet  5  . DISCONTD: azelastine (ASTELIN) 137 MCG/SPRAY nasal spray Place 1 spray into the nose 2 (two) times daily. Use in each nostril as directed       . DISCONTD: loratadine (CLARITIN) 10 MG tablet Take 10 mg by mouth daily.          Allergies   Allergen Reactions  . Tamsulosin     REACTION: pt states INTOL to Flomax - "it affects my eys"    Current Medications, Allergies, Past Medical History, Past Surgical History, Family History, and Social History were reviewed in [employer] record.    Review of Systems         See HPI - all other systems neg except as noted...  The patient complains of dyspnea on exertion and difficulty walking.  The patient denies anorexia, fever, weight loss, weight gain, vision loss, decreased hearing, hoarseness, chest pain, syncope, peripheral edema, prolonged cough, headaches, hemoptysis, abdominal pain, melena, hematochezia, severe indigestion/heartburn, hematuria, incontinence, muscle weakness, suspicious skin lesions, transient blindness, depression, unusual weight change, abnormal bleeding, enlarged lymph nodes, and angioedema.     Objective:   Physical Exam     WD, Overweight, 77 y/o WM in NAD... GENERAL:  Alert & oriented; pleasant & cooperative... HEENT:  [place]/AT, EACs-clear, TMs-wnl, NOSE-clear, THROAT-clear & wnl. NECK:  Supple w/ fairROM; no JVD; normal carotid impulses w/o bruits; no thyromegaly or nodules palpated; no lymphadenopathy. CHEST:  Clear to P & A; without wheezes/ rales/ or rhonchi. HEART:  Regular Rhythm; without murmurs/ rubs/ or gallops. ABDOMEN:  Soft & nontender, w/ diastasis recti, normal bowel sounds; no organomegaly or masses detected. Groin- ?sm hernia on right, min tender, otherw neg. EXT: mod arthritic changes s/p bilat TKRs & right foot surg; no varicose veins/ venous insuffic/ or edema. NEURO:  CN's intact; no focal neuro deficits... DERM:  no lesions noted...  RADIOLOGY DATA:  Reviewed in the EPIC EMR & discussed w/ the  patient...  LABORATORY DATA:  Reviewed in the EPIC EMR & discussed w/ the patient...   Assessment & Plan:   AR>  Followed by DrESL & still on allergy shots, Claritin, Nasonex, Astepro, etc from the allergist; he also  sees [facility] for [facility] w/ tubes in his ear...  HBP>  BP controlloed on diet + doxazosin; continue same...  HYPERLIPID>  On diet alone & numbers improved w/ wt down 9# to 240# today; keep up the good work...  Overweight>  As noted his wt is approx the same over the last several yrs & we discussed the need for fewer calories & more exercise!!!  GERD, Barrett's Esoph>  Followed by DrPatterson on Nexium daily; stable EGDs every 2-3 yrs...  BPH>  Stable on the Doxazosin (he states intol to FLomax); PSA today = 3.31...  DJD, Neck Pain, Back Pain, Neuropathy symptoms>  He is s/p bilat TKRs from DrAlusio; he's had 2 prev neck surgeries from DrHirsh; he's had several foot operations as well; he had neg NCV of left arm 6/12 by DrRamos; he takes Ultram as needed;  We decided to give him a trial of a Pred Dosepak to see if it gives him a measure of relief...  Anxiety>  He has Alprazolam avail for prn use.Marland KitchenMarland Kitchen

## 2011-08-09 NOTE — Patient Instructions (Signed)
Today we updated your med list in our EPIC system...    Continue your current medications the same...  We decided to try a Prednisone Dosepak for your arthritis pain...    When the pack is done, please call me & let me know how it worked on your pain so we can see if you have a threshold level...  Call for any questions...  Let's continue our 6 month follow up visits.Marland KitchenMarland Kitchen

## 2011-09-19 ENCOUNTER — Other Ambulatory Visit: Payer: Self-pay | Admitting: Otolaryngology

## 2011-09-19 DIAGNOSIS — J329 Chronic sinusitis, unspecified: Secondary | ICD-10-CM

## 2011-09-21 ENCOUNTER — Ambulatory Visit
Admission: RE | Admit: 2011-09-21 | Discharge: 2011-09-21 | Disposition: A | Discharge status: Home / Self Care | Payer: Medicare Other | Source: Ambulatory Visit | Attending: Otolaryngology | Admitting: Otolaryngology

## 2011-09-21 DIAGNOSIS — J329 Chronic sinusitis, unspecified: Secondary | ICD-10-CM

## 2011-10-04 ENCOUNTER — Other Ambulatory Visit: Payer: Self-pay | Admitting: Otolaryngology

## 2011-10-28 ENCOUNTER — Other Ambulatory Visit: Payer: Self-pay | Admitting: *Deleted

## 2011-10-28 MED ORDER — DOXAZOSIN MESYLATE 8 MG PO TABS: 8.0000 mg | ORAL_TABLET | Freq: Every day | ORAL | Status: DC

## 2011-11-04 ENCOUNTER — Telehealth: Payer: Self-pay | Admitting: Pulmonary Disease

## 2011-11-04 MED ORDER — DOXAZOSIN MESYLATE 8 MG PO TABS: 8.0000 mg | ORAL_TABLET | Freq: Every day | ORAL | Status: DC

## 2011-11-04 NOTE — Telephone Encounter (Signed)
Rx was sent to pharm  LMOM for the pt to be made aware 

## 2011-11-05 ENCOUNTER — Other Ambulatory Visit: Payer: Self-pay | Admitting: Pulmonary Disease

## 2011-12-30 ENCOUNTER — Encounter: Payer: Self-pay | Admitting: Gastroenterology

## 2012-01-03 ENCOUNTER — Ambulatory Visit (AMBULATORY_SURGERY_CENTER): Payer: Medicare Other | Admitting: *Deleted

## 2012-01-03 VITALS — Ht 71.0 in | Wt 244.6 lb

## 2012-01-03 DIAGNOSIS — K2270 Barrett's esophagus without dysplasia: Secondary | ICD-10-CM

## 2012-01-11 ENCOUNTER — Ambulatory Visit (AMBULATORY_SURGERY_CENTER): Payer: Medicare Other | Admitting: Gastroenterology

## 2012-01-11 ENCOUNTER — Encounter: Payer: Self-pay | Admitting: Gastroenterology

## 2012-01-11 VITALS — BP 150/82 | HR 57 | Temp 98.0°F | Resp 17 | Ht 71.0 in | Wt 244.0 lb

## 2012-01-11 DIAGNOSIS — K2270 Barrett's esophagus without dysplasia: Secondary | ICD-10-CM

## 2012-01-11 DIAGNOSIS — K219 Gastro-esophageal reflux disease without esophagitis: Secondary | ICD-10-CM

## 2012-01-11 MED ORDER — SODIUM CHLORIDE 0.9 % IV SOLN: 500.0000 mL | INTRAVENOUS | Status: DC

## 2012-01-11 NOTE — Addendum Note (Signed)
Addended by: Joen Laura on: 01/11/2012 10:59 AM   Modules accepted: Orders

## 2012-01-11 NOTE — Progress Notes (Signed)
The pt tolerated the egd very well. Maw   

## 2012-01-11 NOTE — Progress Notes (Signed)
Patient did not experience any of the following events: a burn prior to discharge; a fall within the facility; wrong site/side/patient/procedure/implant event; or a hospital transfer or hospital admission upon discharge from the facility. (G8907) Patient did not have preoperative order for IV antibiotic SSI prophylaxis. (G8918)  

## 2012-01-11 NOTE — Patient Instructions (Addendum)
Discharge instructions given with verbal understanding. Biopsies taken. Resume previous medications. YOU HAD AN ENDOSCOPIC PROCEDURE TODAY AT THE [facility]: Refer to the procedure report that was given to you for any specific questions about what was found during the examination.  If the procedure report does not answer your questions, please call your gastroenterologist to clarify.  If you requested that your care partner not be given the details of your procedure findings, then the procedure report has been included in a sealed envelope for you to review at your convenience later.  YOU SHOULD EXPECT: Some feelings of bloating in the abdomen. Passage of more gas than usual.  Walking can help get rid of the air that was put into your GI tract during the procedure and reduce the bloating. If you had a lower endoscopy (such as a colonoscopy or flexible sigmoidoscopy) you may notice spotting of blood in your stool or on the toilet paper. If you underwent a bowel prep for your procedure, then you may not have a normal bowel movement for a few days.  DIET: Your first meal following the procedure should be a light meal and then it is ok to progress to your normal diet.  A half-sandwich or bowl of soup is an example of a good first meal.  Heavy or fried foods are harder to digest and may make you feel nauseous or bloated.  Likewise meals heavy in dairy and vegetables can cause extra gas to form and this can also increase the bloating.  Drink plenty of fluids but you should avoid alcoholic beverages for 24 hours.  ACTIVITY: Your care partner should take you home directly after the procedure.  You should plan to take it easy, moving slowly for the rest of the day.  You can resume normal activity the day after the procedure however you should NOT DRIVE or use heavy machinery for 24 hours (because of the sedation medicines used during the test).    SYMPTOMS TO REPORT IMMEDIATELY: A gastroenterologist  can be reached at any hour.  During normal business hours, 8:30 AM to 5:00 PM Monday through Friday, call [phone].  After hours and on weekends, please call the GI answering service at [phone] who will take a message and have the physician on call contact you.   Following upper endoscopy (EGD)  Vomiting of blood or coffee ground material  New chest pain or pain under the shoulder blades  Painful or persistently difficult swallowing  New shortness of breath  Fever of 100F or higher  Black, tarry-looking stools  FOLLOW UP: If any biopsies were taken you will be contacted by phone or by letter within the next 1-3 weeks.  Call your gastroenterologist if you have not heard about the biopsies in 3 weeks.  Our staff will call the home number listed on your records the next business day following your procedure to check on you and address any questions or concerns that you may have at that time regarding the information given to you following your procedure. This is a courtesy call and so if there is no answer at the home number and we have not heard from you through the emergency physician on call, we will assume that you have returned to your regular daily activities without incident.  SIGNATURES/CONFIDENTIALITY: You and/or your care partner have signed paperwork which will be entered into your electronic medical record.  These signatures attest to the fact that that the information above on your After   Visit Summary has been reviewed and is understood.  Full responsibility of the confidentiality of this discharge information lies with you and/or your care-partner. 

## 2012-01-11 NOTE — Op Note (Signed)
[facility] [address] [place], [place]  [address]  ENDOSCOPY PROCEDURE REPORT  PATIENT:  Isaiah Wilson, Isaiah Wilson  MR#:  [id number] BIRTHDATE:  1935-01-13, 76 yrs. old  GENDER:  male  ENDOSCOPIST:  Vania Rea. Jarold Motto, MD, [facility] Referred by:  PROCEDURE DATE:  01/11/2012 PROCEDURE:  EGD with biopsy, 19147 ASA CLASS:  Class II INDICATIONS:  h/o Barrett's Esophagus  MEDICATIONS:   propofol (Diprivan) 100 mg IV, robinal 0.2 mg IV TOPICAL ANESTHETIC:  Cetacaine Spray  DESCRIPTION OF PROCEDURE:   After the risks and benefits of the procedure were explained, informed consent was obtained.  The LB [id number] endoscope was introduced through the mouth and advanced to the second portion of the duodenum.  The instrument was slowly withdrawn as the mucosa was fully examined. <<PROCEDUREIMAGES>>  ENDOSCOPIC FINDINGS:   There were multiple polyps identified. in the fundus. -3 MM FLAT HYPERPLASTIC POLYPS NOTED. Barrett's esophagus was found in the distal esophagus. 6 cm BARRETT'S SEGMENT BIOPSIED X 2.NO STRICTURE,EROSIONS, MASS LESIONS MOTED. Otherwise normal stomach.  Otherwise normal duodenum. Retroflexed views revealed a hiatal hernia. SMALL 2 CM. HH NOTED,, The scope was then withdrawn from the patient and the procedure completed.  COMPLICATIONS:  None  ENDOSCOPIC IMPRESSION: 1) Barrett's esophagus in the distal esophagus 2) Polyps, multiple in the fundus 3) Otherwise normal stomach 4) Otherwise normal duodenum 5) A hiatal hernia BARRETT'S MUCOSA.R/O DYSPLASIA RECOMMENDATIONS: 1) Await biopsy results 2) continue PPI 3) continue current medications F/U 2-3 YEARS  ______________________________ Vania Rea. Jarold Motto, MD, [facility]  CC:  n. eSIGNED:   Vania Rea. Patterson at 01/11/2012 10:27 AM  [name], [phone]

## 2012-01-12 ENCOUNTER — Telehealth: Payer: Self-pay

## 2012-01-12 NOTE — Telephone Encounter (Signed)
  Follow up Call-  Call back number 01/11/2012  Post procedure Call Back phone  # [phone]  Permission to leave phone message Yes     Patient questions:  Do you have a fever, pain , or abdominal swelling? no Pain Score  0 *  Have you tolerated food without any problems? yes  Have you been able to return to your normal activities? yes  Do you have any questions about your discharge instructions: Diet   no Medications  no Follow up visit  no  Do you have questions or concerns about your Care? no  Actions: * If pain score is 4 or above: No action needed, pain <4. Patient states throat just a little sore.

## 2012-01-17 ENCOUNTER — Encounter: Payer: Self-pay | Admitting: Gastroenterology

## 2012-02-01 ENCOUNTER — Other Ambulatory Visit: Payer: Self-pay | Admitting: Pulmonary Disease

## 2012-02-07 ENCOUNTER — Other Ambulatory Visit (INDEPENDENT_AMBULATORY_CARE_PROVIDER_SITE_OTHER): Payer: Medicare Other

## 2012-02-07 ENCOUNTER — Encounter: Payer: Self-pay | Admitting: Pulmonary Disease

## 2012-02-07 ENCOUNTER — Ambulatory Visit (INDEPENDENT_AMBULATORY_CARE_PROVIDER_SITE_OTHER): Payer: Medicare Other | Admitting: Pulmonary Disease

## 2012-02-07 VITALS — BP 140/72 | HR 53 | Temp 97.6°F | Ht 71.0 in | Wt 246.8 lb

## 2012-02-07 DIAGNOSIS — I10 Essential (primary) hypertension: Secondary | ICD-10-CM

## 2012-02-07 DIAGNOSIS — N320 Bladder-neck obstruction: Secondary | ICD-10-CM

## 2012-02-07 DIAGNOSIS — K2270 Barrett's esophagus without dysplasia: Secondary | ICD-10-CM

## 2012-02-07 DIAGNOSIS — E663 Overweight: Secondary | ICD-10-CM

## 2012-02-07 DIAGNOSIS — N139 Obstructive and reflux uropathy, unspecified: Secondary | ICD-10-CM

## 2012-02-07 DIAGNOSIS — M1990 Unspecified osteoarthritis, unspecified site: Secondary | ICD-10-CM

## 2012-02-07 DIAGNOSIS — Z23 Encounter for immunization: Secondary | ICD-10-CM

## 2012-02-07 DIAGNOSIS — R202 Paresthesia of skin: Secondary | ICD-10-CM

## 2012-02-07 DIAGNOSIS — F411 Generalized anxiety disorder: Secondary | ICD-10-CM

## 2012-02-07 DIAGNOSIS — E785 Hyperlipidemia, unspecified: Secondary | ICD-10-CM

## 2012-02-07 DIAGNOSIS — K219 Gastro-esophageal reflux disease without esophagitis: Secondary | ICD-10-CM

## 2012-02-07 DIAGNOSIS — Z87898 Personal history of other specified conditions: Secondary | ICD-10-CM

## 2012-02-07 LAB — CBC WITH DIFFERENTIAL/PLATELET
Basophils Relative: 1 % (ref 0.0–3.0)
Eosinophils Relative: 4.2 % (ref 0.0–5.0)
HCT: 40.4 % (ref 39.0–52.0)
Hemoglobin: 13.2 g/dL (ref 13.0–17.0)
Lymphs Abs: 2.7 10*3/uL (ref 0.7–4.0)
MCV: 84.7 fl (ref 78.0–100.0)
Monocytes Absolute: 0.8 10*3/uL (ref 0.1–1.0)
Monocytes Relative: 9 % (ref 3.0–12.0)
Neutro Abs: 4.8 10*3/uL (ref 1.4–7.7)
Platelets: 189 10*3/uL (ref 150.0–400.0)
RBC: 4.77 Mil/uL (ref 4.22–5.81)
WBC: 8.7 10*3/uL (ref 4.5–10.5)

## 2012-02-07 LAB — HEPATIC FUNCTION PANEL
AST: 25 U/L (ref 0–37)
Total Bilirubin: 0.5 mg/dL (ref 0.3–1.2)

## 2012-02-07 LAB — BASIC METABOLIC PANEL
BUN: 14 mg/dL (ref 6–23)
Calcium: 9.2 mg/dL (ref 8.4–10.5)
GFR: 80.23 mL/min (ref >60.00)
Potassium: 4.5 mEq/L (ref 3.5–5.1)

## 2012-02-07 LAB — LIPID PANEL
HDL: 45.3 mg/dL (ref >39.00)
Triglycerides: 168 mg/dL — ABNORMAL HIGH (ref 0.0–149.0)
VLDL: 33.6 mg/dL (ref 0.0–40.0)

## 2012-02-07 MED ORDER — ISOMETHEPTENE-APAP-DICHLORAL 65-325-100 MG PO CAPS: 1.0000 | ORAL_CAPSULE | Freq: Four times a day (QID) | ORAL | Status: DC | PRN

## 2012-02-07 NOTE — Progress Notes (Addendum)
Subjective:    Patient ID: Isaiah Wilson, male    DOB: Sep 12, 1934, 77 y.o.   MRN: [id number]  HPI 77 y/o WM here for a follow up visit...  he has multiple medical problems as noted below...   Followed for general medical purposes w/ hx HBP, Hyperlipidemia, Barrett's esoph, ischemic colitis in 2008, BPH, and DJD...  ~  June 22, 2009:  he had a good 172mo- just c/o some burning pain in left groin area... ?loose ing ring/ ing hernia but not bad enough for surg he says- try Lyrica 50-100 Qhs first... needs refill Rx for 2011 & due for TDAP today.  ~  November 27, 2009:  he's been doing well x left foot> surg x2 by Podiatrist for bunion, & toe shortening... we discussed poss 2nd opinion w/ Orthopedist- DrBednarz, he will decide... otherw feeling well- no new complaints or concerns...   ~  July 30, 2010:  he had additional right foot surg- this time by [facility] & improved, but he is requesting order for phys therapy & we will oblige... also offered Tramadol for pain as he is just using Tylenol Prn... he continues to f/u w/ DrESL for allergies & now taking Astepro, Nasonex, Claritin, & shots... BP controlled on meds & feeling well... still trying to regulate Chol w/ diet alone but weight up 9# & he will ret for FLP & consider med rx if nec... GI & GU are stable 7 he is voiding well w/o the Uroxatrol...  up to date on vaccinations.  ~  February 09, 2011:  172mo ROV & doing well overall, no new complaints or concerns; he requests refill prescriptions for 30d supplies.Marland Kitchen    HBP> BP controlled on diet + Doxazosin; BP today= 138/72 & feeling well w/o HA, CP, palpit, dyspnea, edema, etc;     HYPERLIPID> His labs are not at the goal numbers as he has refused statin meds & prefers diet alone; we reviewed diet & wt reduction targets...    Overweight> His weight hasn't changed 10# in several yrs & today weighs in at 240# which equals a BMI of 33 in his 6' frame...    GERD/ Barrett's> Followed by DrPatterson w/ EGD  Q53yrs due to Barrett's esoph; on Nexium 40mg /d & stable...    BPH> He is on the Doxazosin & states intol to Flomax; PSAs have been normal & he continues to void satis w/o problems reported...    DJD, Neuropathy> He c/o paresthesias involving his left arm from elbow down; DrRamos did NCV study 6/12 which was NEG- no neuropathy, radiculopathy, or CTS.    Anxiety> he has Alprazolam for prn use...  ~  August 09, 2011:  172mo ROV & Isaiah Wilson is stable just c/o arthritis "all over" which he treats w/ OTC analgesics, Tramadol, and warm soaks, etc;  He had eval by DrRamos w/ shots in is back but they didn't help AND they cost $4K, he says;  DrAlusio follows his bilat knee replacement status but unfortunately he still has pain R>L; we reviewed the importance of wt reduction & exercise... Today we reviewed his prob list, meds, prev XRays & labs> see prob list update below>>  ~  February 07, 2012:  172mo ROV & Isaiah Wilson is c/o an odd HA> states it shoots across his head from front to back & occurs ~once per week for the last month or so; we discussed trial Midrin Rx & further eval if HA persists despite Rx.Marland KitchenMarland Kitchen  He had Allergy recheck from DrWhelan> nasal spray changed to Dymista 1spBid (but he notes the Astepro+Flonase are cheaper) + saline irrigations prn...    He's been followed by DrByers for ENT> w/ CT Sinuses showing chronic sinusitis (no A/F levels but had mastoid effusion) & he performed endoscopic sinus surg, septoplasty, & right myringotomy tube 4/13; he notes that "my sinuses are a mess but the surg really helped my breathing"    He had GI recheck w/ EGD 8/13 by drPatterson f/u of his Barrett's esoph> 6cm segment of Barrett's mucosa biopsied= no dysplasia etc; No stricture, no mass, +mult gastric polyps    We reviewed prob list, meds, xrays and labs> see below >> OK flu shot... LABS 9/13:  FLP- not at goals on diet, needs to get wt down;  Chems- wnl;  CBC- wnl;  TSH=2.25;  PSA=2.31    Problem List:     ALLERGIC  RHINITIS (ICD-477.9) - DrESL referred pt to [facility] for nasal polyps... on allergy shots weekly, plus Claritin, Astepro, Nasonex. ~  He had tubes placed in his ears by [facility], ENT in the past... ~  4/13: he had CT Sinuses showing chronic sinusitis (no A/F levels but had mastoid effusion) & DrByers performed endoscopic sinus surg, septoplasty, & right myringotomy tube 4/13; he notes that "my sinuses are a mess but the surg really helped my breathing"  HYPERTENSION (ICD-401.9) - controlled on ASA 81mg /d, & DOXAZOSIN 8mg /d... ~  he had a negative Cardiolite in 2001 and normal CDoppler's in 2006. ~  9/12:  BP=138/72 and he feels well; doesn't check BP at home but it's OK at the drug store; denies HA, fatigue, visual changes, CP, palipit, dizziness, syncope, dyspnea, edema, etc... ~  CXR 9/12 showed borderline heart size, clear lungs, CSpine fusion & thor spine degen changes... ~  3/13:  BP= 140/78 & he remains asymptomatic> denies HA, CP, palpit, SOB, edema... ~  9/13:  BP= 140/72 & he remains asymptomatic...  HYPERLIPIDEMIA (ICD-272.4) - on diet alone... he has repeatedly refused Statin Rx. ~  FLP 8/07 showed TChol 189, TG 200, HDL 32, LDL 117... rec- med Rx, but he preferred diet. ~  FLP 8/09 showed TChol 179, TG 94, HDL 38, LDL 122... rec- consider meds, he refuses. ~  FLP 8/10 showed TChol 186, TG 140, HDL 37, LDL 121... better diet, get wt down. ~  FLP 7/11 showed TChol 198, TG 159, HDL 41, LDL 125... he continues to decline statin Rx. ~  FLP 3/12 showed TChol 184, TG 146, HDL 37, LDL 118 ~  FLP 9/12 showed TChol 166, TG 73, HDL 44, LDL 108 ~  FLP 9/13 on diet alone showed TChol 202, TG 168, HDL 45, LDL 128... He doesn't want meds, needs to lose wt.  OVERWEIGHT (ICD-278.02) - we discussed diet + exercise program sufficient to lose weight. ~  weight 2/10 = 239#  ~  weight 8/10 = 244# ~  weight 1/11 = 238# ~  weight 7/11 = 240# ~  weight 3/12 = 249#... we reviewed need for diet +  exercise. ~  Weight 9/12 = 240# ~  Weight 3/13 = 244# ~  Weight 9/13 = 247#  GERD (ICD-530.81) & BARRETTS ESOPHAGUS (ICD-530.85) - followed by DrPatterson/Brodie> on NEXIUM 40mg /d... ~  EGD 7/08 showed HH and Barrett's mucosa (no dysplasia)... ~  EGD 8/10 showed sm hyperplastic polyps, & 5cm segment of Barrett's mucosa... f/u planned 33yrs. ~  EGD 8/13 by DrPatterson showed 6cm segment of Barrett's mucosa  biopsied= no dysplasia etc; No stricture, no mass, +mult gastric polyps  Hx of ISCHEMIC COLITIS (ICD-557.9) - Hosp 5/08 w/ abd pain & lower GIB> eval revealed prob ischemic colitis... he had a f/u colonoscopy 7/08 which was normal without colitis... no recurrent symptoms to date...  BENIGN PROSTATIC HYPERTROPHY, HX OF (ICD-V13.8) - hx BPH and UTI in the past... DOXAZOSIN helps... INTOL Flomax "it affects my eyes"; added Uroxatrol 10mg /d 8/10 and improved nocturia & LTOS, he has stopped this med & states voiding well... ~  labs 8/07 showed PSA= 2.6 ~  labs 8/09 showed PSA= 2.04 ~  labs 8/10 showed PSA= 2.54 ~  labs 7/11 showed PSA= 2.76 ~  Labs 9/12 showed PSA= 3.31 ~  Labs 9/13 showed PSA= 2.31  DEGENERATIVE JOINT DISEASE (ICD-715.90) - S/P Right TKR 2/08 & Left TKR 2/09 DrAlusio & 2 prev neck surg by Carles Collet... he notes incr LBP as well... ~  7/11: he reports 2 operations left foot by podiatrist for bunion & toe straightening. ~  10/11: he has 3rd operation on right foot, this one from DrBednarz, & finally improved. ~  2012:  He reports shots in his back by DrRamos didn't help...  NEUROPATHY (ICD-355.9) - prev tried Lyrica- ?benefit... ~  C/p paresthesias in left arm & NCVs from DrRamos were neg- no neuropathy, no radiculopathy, no evid CTS...  ANXIETY (ICD-300.00) - he is under mod stress and requests ALPRAZOLAM 0.5mg - 1/2 to 1 tab Tid Prn...  Health Maintenance:  he gets the yearly flu vaccine each fall... given PNEUMOVAX ~2007... given TDAP 1/11...   Past Medical History    Diagnosis Date  . Allergic rhinitis   . Hypertension   . Hyperlipidemia   . Overweight   . GERD (gastroesophageal reflux disease)   . Barrett's esophagus   . Colitis, ischemic   . Benign prostatic hypertrophy   . DJD (degenerative joint disease)   . Neuropathy   . Anxiety     Past Surgical History  Procedure Date  . Laparoscopic cholecystectomy 1993    Dr. Luan Moore  . 2 neck surgeries 2005-2006    Dr. Georgia Dom cx decompression, diskectomy,fusion C6-7 allograft  . Right total knee replacement 06/2006    Dr. Despina Hick  . Left total knee replacement 07/2007    Dr. Despina Hick  . Appendectomy   . Foot surgery     right x 3  . Septoplasty 1975, May 2013    x3    Outpatient Encounter Prescriptions as of 02/07/2012  Medication Sig Dispense Refill  . Acetaminophen (TYLENOL ARTHRITIS EXT RELIEF PO) Take 1 capsule by mouth as needed.      . ALPRAZolam (XANAX) 0.5 MG tablet Take 1/2 to 1 tablet by mouth three times daily as needed for nerves  90 tablet  5  . aspirin 81 MG tablet Take 81 mg by mouth daily.        Marland Kitchen doxazosin (CARDURA) 8 MG tablet Take 1 tablet (8 mg total) by mouth at bedtime.  30 tablet  5  . DYMISTA 137-50 MCG/ACT SUSP daily.       Marland Kitchen EPIPEN 2-PAK 0.3 MG/0.3ML DEVI       . esomeprazole (NEXIUM) 40 MG capsule Take 1 capsule (40 mg total) by mouth daily before breakfast.  30 capsule  11  . Lysine 500 MG CAPS Take 1 capsule by mouth daily.        . Multiple Vitamins-Minerals (CENTRUM) tablet Take 1 tablet by mouth daily.        Marland Kitchen  traMADol (ULTRAM) 50 MG tablet Take 1 tablet up to three times a day as needed for pain  90 tablet  5  . UNABLE TO FIND Med Name: allergy shots weekly      . zinc gluconate 50 MG tablet Take 50 mg by mouth daily.        Marland Kitchen DISCONTD: NEXIUM 40 MG capsule TAKE ONE CAPSULE (40 MG TOTAL) BY MOUTH DAILY BEFORE BREAKFAST.  30 capsule  1    Allergies  Allergen Reactions  . Tamsulosin     REACTION: pt states INTOL to Flomax - "it affects my eyes"     Current Medications, Allergies, Past Medical History, Past Surgical History, Family History, and Social History were reviewed in [employer] record.    Review of Systems         See HPI - all other systems neg except as noted...  The patient complains of dyspnea on exertion and difficulty walking.  The patient denies anorexia, fever, weight loss, weight gain, vision loss, decreased hearing, hoarseness, chest pain, syncope, peripheral edema, prolonged cough, headaches, hemoptysis, abdominal pain, melena, hematochezia, severe indigestion/heartburn, hematuria, incontinence, muscle weakness, suspicious skin lesions, transient blindness, depression, unusual weight change, abnormal bleeding, enlarged lymph nodes, and angioedema.     Objective:   Physical Exam     WD, Overweight, 77 y/o WM in NAD... GENERAL:  Alert & oriented; pleasant & cooperative... HEENT:  [place]/AT, EACs-clear, TMs-wnl, NOSE-clear, THROAT-clear & wnl. NECK:  Supple w/ fairROM; no JVD; normal carotid impulses w/o bruits; no thyromegaly or nodules palpated; no lymphadenopathy. CHEST:  Clear to P & A; without wheezes/ rales/ or rhonchi. HEART:  Regular Rhythm; without murmurs/ rubs/ or gallops. ABDOMEN:  Soft & nontender, w/ diastasis recti, normal bowel sounds; no organomegaly or masses detected. Groin- ?sm hernia on right, min tender, otherw neg. EXT: mod arthritic changes s/p bilat TKRs & right foot surg; no varicose veins/ venous insuffic/ or edema. NEURO:  CN's intact; no focal neuro deficits... DERM:  no lesions noted...  RADIOLOGY DATA:  Reviewed in the EPIC EMR & discussed w/ the patient...  LABORATORY DATA:  Reviewed in the EPIC EMR & discussed w/ the patient...   Assessment & Plan:    HEADACHE>  ?muscle contraction vs mixed etiology; rec MIDRIN trial & refer to HA clinic if pain persists...  AR>  Followed by DrWhelan & still on allergy shots, Claritin, Nasonex, Astepro, etc from the  allergist; he also sees DrByers for ENT w/ sinus surg 4/13...  HBP>  BP controlloed on diet + doxazosin; continue same...  HYPERLIPID>  On diet alone & numbers not at goals, needs better diet, exercise, wt reduction...  Overweight>  As noted his wt is approx the same over the last several yrs & we discussed the need for fewer calories & more exercise!!!  GERD, Barrett's Esoph>  Followed by DrPatterson on Nexium daily; stable EGDs every 2-3 yrs...  BPH>  Stable on the Doxazosin (he states intol to FLomax); PSA today = 3.31...  DJD, Neck Pain, Back Pain, Neuropathy symptoms>  He is s/p bilat TKRs from DrAlusio; he's had 2 prev neck surgeries from DrHirsh; he's had several foot operations as well; he had neg NCV of left arm 6/12 by DrRamos; he takes Ultram as needed;  We decided to give him a trial of a Pred Dosepak to see if it gives him a measure of relief...  Anxiety>  He has Alprazolam avail for prn use...   Patient's Medications  New Prescriptions   ISOMETHEPTENE-ACETAMINOPHEN-DICHLORALPHENAZONE (MIDRIN) 65-325-100 MG CAPSULE    Take 1 capsule by mouth every 6 (six) hours as needed for migraine.  Previous Medications   ACETAMINOPHEN (TYLENOL ARTHRITIS EXT RELIEF PO)    Take 1 capsule by mouth as needed.   ALPRAZOLAM (XANAX) 0.5 MG TABLET    Take 1/2 to 1 tablet by mouth three times daily as needed for nerves   ASPIRIN 81 MG TABLET    Take 81 mg by mouth daily.     DOXAZOSIN (CARDURA) 8 MG TABLET    Take 1 tablet (8 mg total) by mouth at bedtime.   DYMISTA 137-50 MCG/ACT SUSP    daily.    EPIPEN 2-PAK 0.3 MG/0.3ML DEVI       ESOMEPRAZOLE (NEXIUM) 40 MG CAPSULE    Take 1 capsule (40 mg total) by mouth daily before breakfast.   LYSINE 500 MG CAPS    Take 1 capsule by mouth daily.     MULTIPLE VITAMINS-MINERALS (CENTRUM) TABLET    Take 1 tablet by mouth daily.     TRAMADOL (ULTRAM) 50 MG TABLET    Take 1 tablet up to three times a day as needed for pain   UNABLE TO FIND    Med Name:  allergy shots weekly   ZINC GLUCONATE 50 MG TABLET    Take 50 mg by mouth daily.    Modified Medications   No medications on file  Discontinued Medications   NEXIUM 40 MG CAPSULE    TAKE ONE CAPSULE (40 MG TOTAL) BY MOUTH DAILY BEFORE BREAKFAST.

## 2012-02-07 NOTE — Patient Instructions (Addendum)
Today we updated your med list in our EPIC system...    Continue your current medications the same...  We decided to try [facility] for your headaches>     Take one tab up to every 6H as needed...  Today we did your follow up FASTING blood work...    We will call you w/ the reports...  We gave you the 2013 Flu vaccine today...  Let's plan another follow up visit in 6months.Marland KitchenMarland Kitchen

## 2012-04-04 ENCOUNTER — Other Ambulatory Visit: Payer: Self-pay | Admitting: Pulmonary Disease

## 2012-06-14 ENCOUNTER — Telehealth: Payer: Self-pay | Admitting: Pulmonary Disease

## 2012-06-14 MED ORDER — TRAMADOL HCL 50 MG PO TABS: ORAL_TABLET | ORAL | Status: DC

## 2012-06-14 NOTE — Telephone Encounter (Signed)
I spoke with spouse. Aware I have sent RX to the pharmacy. Nothing further was needed.

## 2012-08-07 ENCOUNTER — Encounter: Payer: Self-pay | Admitting: Pulmonary Disease

## 2012-08-07 ENCOUNTER — Ambulatory Visit (INDEPENDENT_AMBULATORY_CARE_PROVIDER_SITE_OTHER): Payer: Medicare Other | Admitting: Pulmonary Disease

## 2012-08-07 ENCOUNTER — Other Ambulatory Visit (INDEPENDENT_AMBULATORY_CARE_PROVIDER_SITE_OTHER): Payer: Medicare Other

## 2012-08-07 VITALS — BP 130/74 | HR 65 | Temp 97.6°F | Ht 71.0 in | Wt 248.6 lb

## 2012-08-07 DIAGNOSIS — G589 Mononeuropathy, unspecified: Secondary | ICD-10-CM

## 2012-08-07 DIAGNOSIS — I10 Essential (primary) hypertension: Secondary | ICD-10-CM

## 2012-08-07 DIAGNOSIS — K219 Gastro-esophageal reflux disease without esophagitis: Secondary | ICD-10-CM

## 2012-08-07 DIAGNOSIS — E785 Hyperlipidemia, unspecified: Secondary | ICD-10-CM

## 2012-08-07 DIAGNOSIS — F411 Generalized anxiety disorder: Secondary | ICD-10-CM

## 2012-08-07 DIAGNOSIS — Z87898 Personal history of other specified conditions: Secondary | ICD-10-CM

## 2012-08-07 DIAGNOSIS — E663 Overweight: Secondary | ICD-10-CM

## 2012-08-07 DIAGNOSIS — M1990 Unspecified osteoarthritis, unspecified site: Secondary | ICD-10-CM

## 2012-08-07 DIAGNOSIS — J309 Allergic rhinitis, unspecified: Secondary | ICD-10-CM

## 2012-08-07 DIAGNOSIS — K2270 Barrett's esophagus without dysplasia: Secondary | ICD-10-CM

## 2012-08-07 LAB — LIPID PANEL
Cholesterol: 185 mg/dL (ref 0–200)
HDL: 37.1 mg/dL — ABNORMAL LOW (ref >39.00)
LDL Cholesterol: 123 mg/dL — ABNORMAL HIGH (ref 0–99)
Total CHOL/HDL Ratio: 5
Triglycerides: 127 mg/dL (ref 0.0–149.0)
VLDL: 25.4 mg/dL (ref 0.0–40.0)

## 2012-08-07 MED ORDER — ESOMEPRAZOLE MAGNESIUM 40 MG PO CPDR: 40.0000 mg | DELAYED_RELEASE_CAPSULE | Freq: Every day | ORAL | Status: DC

## 2012-08-07 MED ORDER — CIPROFLOXACIN-DEXAMETHASONE 0.3-0.1 % OT SUSP: 4.0000 [drp] | Freq: Two times a day (BID) | OTIC | Status: DC

## 2012-08-07 MED ORDER — TRAMADOL HCL 50 MG PO TABS: ORAL_TABLET | ORAL | Status: DC

## 2012-08-07 MED ORDER — DOXAZOSIN MESYLATE 8 MG PO TABS: 8.0000 mg | ORAL_TABLET | Freq: Every day | ORAL | Status: DC

## 2012-08-07 MED ORDER — ALPRAZOLAM 0.5 MG PO TABS: ORAL_TABLET | ORAL | Status: DC

## 2012-08-07 NOTE — Patient Instructions (Addendum)
Today we updated your med list in our EPIC system...    Continue your current medications the same...    We refilled your meds per request...  Today we did your targeted fasting blood work...    We will contact you w/ the results when avail...  Let's get on track w/ our diet & exercise program...  Call for any questions...  Let's continue our 54mo follow up visits.Marland KitchenMarland Kitchen

## 2012-08-07 NOTE — Progress Notes (Signed)
Subjective:    Patient ID: Isaiah Wilson, male    DOB: 12-14-1934, 78 y.o.   MRN: [id number]  HPI 78 y/o WM here for a follow up visit...  he has multiple medical problems as noted below...   Followed for general medical purposes w/ hx HBP, Hyperlipidemia, Barrett's esoph, ischemic colitis in 2008, BPH, and DJD...  ~  February 09, 2011:  68mo ROV & doing well overall, no new complaints or concerns; he requests refill prescriptions for 30d supplies.Marland Kitchen    HBP> BP controlled on diet + Doxazosin; BP today= 138/72 & feeling well w/o HA, CP, palpit, dyspnea, edema, etc;     HYPERLIPID> His labs are not at the goal numbers as he has refused statin meds & prefers diet alone; we reviewed diet & wt reduction targets...    Overweight> His weight hasn't changed 10# in several yrs & today weighs in at 240# which equals a BMI of 33 in his 6' frame...    GERD/ Barrett's> Followed by Isaiah Wilson w/ EGD Q59yrs due to Barrett's esoph; on Nexium 40mg /d & stable...    BPH> He is on the Doxazosin & states intol to Flomax; PSAs have been normal & he continues to void satis w/o problems reported...    DJD, Neuropathy> He c/o paresthesias involving his left arm from elbow down; Isaiah Wilson did NCV study 6/12 which was NEG- no neuropathy, radiculopathy, or CTS.    Anxiety> he has Alprazolam for prn use...  ~  August 09, 2011:  68mo ROV & Isaiah Wilson is stable just c/o arthritis "all over" which he treats w/ OTC analgesics, Tramadol, and warm soaks, etc;  He had eval by Isaiah Wilson w/ shots in is back but they didn't help AND they cost $4K, he says;  Isaiah Wilson follows his bilat knee replacement status but unfortunately he still has pain R>L; we reviewed the importance of wt reduction & exercise... Today we reviewed his prob list, meds, prev XRays & labs> see prob list update below>>  ~  February 07, 2012:  68mo ROV & Isaiah Wilson is c/o an odd HA> states it shoots across his head from front to back & occurs ~once per week for the last month or so;  we discussed trial Midrin Rx & further eval if HA persists despite Rx...     He had Allergy recheck from Isaiah Wilson> nasal spray changed to Dymista 1spBid (but he notes the Astepro+Flonase are cheaper) + saline irrigations prn...    He's been followed by Isaiah Wilson for ENT> w/ CT Sinuses showing chronic sinusitis (no A/F levels but had mastoid effusion) & he performed endoscopic sinus surg, septoplasty, & right myringotomy tube 4/13; he notes that "my sinuses are a mess but the surg really helped my breathing"    He had GI recheck w/ EGD 8/13 by Isaiah Wilson f/u of his Barrett's esoph> 6cm segment of Barrett's mucosa biopsied= no dysplasia etc; No stricture, no mass, +mult gastric polyps    We reviewed prob list, meds, xrays and labs> see below >> OK flu shot... LABS 9/13:  FLP- not at goals on diet, needs to get wt down;  Chems- wnl;  CBC- wnl;  TSH=2.25;  PSA=2.31  ~  August 07, 2012:  68mo ROV & Isaiah Wilson is doing well, no new complaints or concerns; We reviewed the following medical problems during today's office visit >>     AR> on allergy shots, Dymista, Claritin, Flonase, Ciprodex per Isaiah Wilson; CT Sinuses 4/13 showed chr sinusitis, no A/F levels, +OM &  mastoid effusion on right=> he had ENT surg Isaiah Wilson...    HBP> BP controlled on diet + Doxazosin8; BP today= 130/74 & feeling well w/o HA, CP, palpit, dyspnea, edema, etc...    HYPERLIPID> he has refused statin meds & prefers diet alone; FLP 3/14 shows TChol 185, TG 127, HDL 37, LDL 123; we reviewed diet & wt reduction targets...    Overweight> His weight hasn't changed 10# in several yrs & today weighs in at 249# which equals a BMI of 34-5 in his 6' frame...    GERD/ Barrett's> Followed by Isaiah Wilson w/ EGD Q32yrs due to Barrett's esoph- on Nexium 40mg /d & stable; last EGD was 8/13 & bx was neg- no dysplasia...    BPH> on Doxazosin8 & states intol to Flomax; PSAs have been normal & he continues to void satis w/o problems reported...    DJD, Neuropathy> on  Tramadol50; he c/o paresthesias involving his left arm from elbow down; Isaiah Wilson did NCV study 6/12 which was NEG- no neuropathy, radiculopathy, or CTS; He reports that Isaiah Wilson did surg 11/13 on right leg & insurance doesn't want to pay...    Anxiety> on Alprazolam0.5mg  for prn use... We reviewed prob list, meds, xrays and labs> see below for updates >>           Problem List:     ALLERGIC RHINITIS (ICD-477.9) - Isaiah Wilson referred pt to Isaiah Wilson for nasal polyps... on allergy shots weekly, plus Claritin, Astepro, Nasonex. ~  He had tubes placed in his ears by Isaiah Wilson, ENT in the past... ~  4/13: he had CT Sinuses showing chronic sinusitis (no A/F levels but had mastoid effusion) & Isaiah Wilson performed endoscopic sinus surg, septoplasty, & right myringotomy tube 4/13; he notes that "my sinuses are a mess but the surg really helped my breathing"  HYPERTENSION (ICD-401.9) - controlled on ASA 81mg /d, & DOXAZOSIN 8mg /d... ~  he had a negative Cardiolite in 2001 and normal CDoppler's in 2006. ~  9/12:  BP=138/72 and he feels well; doesn't check BP at home but it's OK at the drug store; denies HA, fatigue, visual changes, CP, palipit, dizziness, syncope, dyspnea, edema, etc... ~  CXR 9/12 showed borderline heart size, clear lungs, CSpine fusion & thor spine degen changes... ~  3/13:  BP= 140/78 & he remains asymptomatic> denies HA, CP, palpit, SOB, edema... ~  9/13:  BP= 140/72 & he remains asymptomatic... ~  3/14: BP controlled on diet + Doxazosin8; BP today= 130/74 & feeling well w/o HA, CP, palpit, dyspnea, edema, etc...  HYPERLIPIDEMIA (ICD-272.4) - on diet alone... he has repeatedly refused Statin Rx. ~  FLP 8/07 showed TChol 189, TG 200, HDL 32, LDL 117... rec- med Rx, but he preferred diet. ~  FLP 8/09 showed TChol 179, TG 94, HDL 38, LDL 122... rec- consider meds, he refuses. ~  FLP 8/10 showed TChol 186, TG 140, HDL 37, LDL 121... better diet, get wt down. ~  FLP 7/11 showed TChol 198, TG 159,  HDL 41, LDL 125... he continues to decline statin Rx. ~  FLP 3/12 showed TChol 184, TG 146, HDL 37, LDL 118 ~  FLP 9/12 showed TChol 166, TG 73, HDL 44, LDL 108 ~  FLP 9/13 on diet alone showed TChol 202, TG 168, HDL 45, LDL 128... He doesn't want meds, needs to lose wt. ~  FLP 3/14 on diet alone showed TChol 185, TG 127, HDL 37, LDL 123  OVERWEIGHT (ICD-278.02) - we discussed diet + exercise program sufficient to lose  weight. ~  weight 2/10 = 239#  ~  weight 8/10 = 244# ~  weight 1/11 = 238# ~  weight 7/11 = 240# ~  weight 3/12 = 249#... we reviewed need for diet + exercise. ~  Weight 9/12 = 240# ~  Weight 3/13 = 244# ~  Weight 9/13 = 247# ~  Weight 3/14 = 249#  GERD (ICD-530.81) & BARRETTS ESOPHAGUS (ICD-530.85) - followed by Isaiah Wilson/Brodie> on NEXIUM 40mg /d... ~  EGD 7/08 showed HH and Barrett's mucosa (no dysplasia)... ~  EGD 8/10 showed sm hyperplastic polyps, & 5cm segment of Barrett's mucosa... f/u planned 5yrs. ~  EGD 8/13 by Isaiah Wilson showed 6cm segment of Barrett's mucosa biopsied= no dysplasia etc; No stricture, no mass, +mult gastric polyps  Hx of ISCHEMIC COLITIS (ICD-557.9) - Hosp 5/08 w/ abd pain & lower GIB> eval revealed prob ischemic colitis... he had a f/u colonoscopy 7/08 which was normal without colitis... no recurrent symptoms to date...  BENIGN PROSTATIC HYPERTROPHY, HX OF (ICD-V13.8) - hx BPH and UTI in the past... DOXAZOSIN helps... INTOL Flomax "it affects my eyes"; added Uroxatrol 10mg /d 8/10 and improved nocturia & LTOS, he has stopped this med & states voiding well... ~  labs 8/07 showed PSA= 2.6 ~  labs 8/09 showed PSA= 2.04 ~  labs 8/10 showed PSA= 2.54 ~  labs 7/11 showed PSA= 2.76 ~  Labs 9/12 showed PSA= 3.31 ~  Labs 9/13 showed PSA= 2.31  DEGENERATIVE JOINT DISEASE (ICD-715.90) - S/P Right TKR 2/08 & Left TKR 2/09 Isaiah Wilson & 2 prev neck surg by Carles Collet... he notes incr LBP as well... ~  7/11: he reports 2 operations left foot by podiatrist for  bunion & toe straightening. ~  10/11: he has 3rd operation on right foot, this one from Isaiah Wilson, & finally improved. ~  2012:  He reports shots in his back by Isaiah Wilson didn't help... ~  3/14: on Tramadol50; he c/o paresthesias involving his left arm from elbow down; Isaiah Wilson did NCV study 6/12 which was NEG- no neuropathy, radiculopathy, or CTS; He reports that Isaiah Wilson did surg 11/13 on right leg & insurance doesn't want to pay...  NEUROPATHY (ICD-355.9) - prev tried Lyrica- ?benefit... ~  C/p paresthesias in left arm & NCVs from Isaiah Wilson were neg- no neuropathy, no radiculopathy, no evid CTS...  ANXIETY (ICD-300.00) - he is under mod stress and requests ALPRAZOLAM 0.5mg - 1/2 to 1 tab Tid Prn...  Health Maintenance:  he gets the yearly flu vaccine each fall... given PNEUMOVAX ~2007... given TDAP 1/11...   Past Medical History  Diagnosis Date  . Allergic rhinitis   . Hypertension   . Hyperlipidemia   . Overweight   . GERD (gastroesophageal reflux disease)   . Barrett's esophagus   . Colitis, ischemic   . Benign prostatic hypertrophy   . DJD (degenerative joint disease)   . Neuropathy   . Anxiety     Past Surgical History  Procedure Laterality Date  . Laparoscopic cholecystectomy  1993    Dr. Luan Moore  . 2 neck surgeries  2005-2006    Dr. Georgia Dom cx decompression, diskectomy,fusion C6-7 allograft  . Right total knee replacement  06/2006    Dr. Despina Hick  . Left total knee replacement  07/2007    Dr. Despina Hick  . Appendectomy    . Foot surgery      right x 3  . Septoplasty  1975, May 2013    x3    Outpatient Encounter Prescriptions as of 08/07/2012  Medication Sig  Dispense Refill  . Acetaminophen (TYLENOL ARTHRITIS EXT RELIEF PO) Take 1 capsule by mouth as needed.      . ALPRAZolam (XANAX) 0.5 MG tablet Take 1/2 to 1 tablet by mouth three times daily as needed for nerves  90 tablet  5  . aspirin 81 MG tablet Take 81 mg by mouth daily.        . ciprofloxacin-dexamethasone  (CIPRODEX) otic suspension Place 4 drops into both ears 2 (two) times daily.      Marland Kitchen doxazosin (CARDURA) 8 MG tablet Take 1 tablet (8 mg total) by mouth at bedtime.  30 tablet  5  . DYMISTA 137-50 MCG/ACT SUSP Place 2 sprays into the nose daily.       Marland Kitchen EPIPEN 2-PAK 0.3 MG/0.3ML DEVI As needed      . fluticasone (VERAMYST) 27.5 MCG/SPRAY nasal spray Place 2 sprays into the nose daily.      Marland Kitchen loratadine (CLARITIN) 10 MG tablet Take 10 mg by mouth daily.      Marland Kitchen Lysine 500 MG CAPS Take 1 capsule by mouth daily.        . Multiple Vitamins-Minerals (CENTRUM) tablet Take 1 tablet by mouth daily.        Marland Kitchen NEXIUM 40 MG capsule TAKE ONE CAPSULE (40 MG TOTAL) BY MOUTH DAILY BEFORE BREAKFAST.  30 capsule  11  . traMADol (ULTRAM) 50 MG tablet Take 1 tablet up to three times a day as needed for pain  90 tablet  5  . UNABLE TO FIND Med Name: allergy shots weekly      . zinc gluconate 50 MG tablet Take 50 mg by mouth daily.        . [DISCONTINUED] isometheptene-acetaminophen-dichloralphenazone (MIDRIN) 65-325-100 MG capsule Take 1 capsule by mouth every 6 (six) hours as needed for migraine.  50 capsule  5   No facility-administered encounter medications on file as of 08/07/2012.    Allergies  Allergen Reactions  . Tamsulosin     REACTION: pt states INTOL to Flomax - "it affects my eyes"    Current Medications, Allergies, Past Medical History, Past Surgical History, Family History, and Social History were reviewed in [employer] record.    Review of Systems         See HPI - all other systems neg except as noted...  The patient complains of dyspnea on exertion and difficulty walking.  The patient denies anorexia, fever, weight loss, weight gain, vision loss, decreased hearing, hoarseness, chest pain, syncope, peripheral edema, prolonged cough, headaches, hemoptysis, abdominal pain, melena, hematochezia, severe indigestion/heartburn, hematuria, incontinence, muscle weakness,  suspicious skin lesions, transient blindness, depression, unusual weight change, abnormal bleeding, enlarged lymph nodes, and angioedema.     Objective:   Physical Exam     WD, Overweight, 78 y/o WM in NAD... GENERAL:  Alert & oriented; pleasant & cooperative... HEENT:  [place]/AT, EACs-clear, TMs-wnl, NOSE-clear, THROAT-clear & wnl. NECK:  Supple w/ fairROM; no JVD; normal carotid impulses w/o bruits; no thyromegaly or nodules palpated; no lymphadenopathy. CHEST:  Clear to P & A; without wheezes/ rales/ or rhonchi. HEART:  Regular Rhythm; without murmurs/ rubs/ or gallops. ABDOMEN:  Soft & nontender, w/ diastasis recti, normal bowel sounds; no organomegaly or masses detected. Groin- ?sm hernia on right, min tender, otherw neg. EXT: mod arthritic changes s/p bilat TKRs & right foot surg; no varicose veins/ venous insuffic/ or edema. NEURO:  CN's intact; no focal neuro deficits... DERM:  no lesions noted...  RADIOLOGY DATA:  Reviewed in the [facility] EMR & discussed w/ the patient...  LABORATORY DATA:  Reviewed in the EP[facility] EMR & discussed w/ the patient...   Assessment & Plan:    AR>  Followed by Isaiah Wilson & still on allergy shots, Claritin, Nasonex, Astepro, etc from the allergist; he also sees Isaiah Wilson for ENT w/ sinus surg 4/13...  HBP>  BP controlloed on diet + Doxazosin8; continue same...  HYPERLIPID>  On diet alone & numbers not at goals, needs better diet, exercise, wt reduction...  Overweight>  As noted his wt is approx the same over the last several yrs & we discussed the need for fewer calories & more exercise!!!  GERD, Barrett's Esoph>  Followed by Isaiah Wilson on Nexium daily; stable EGDs every 2-3 yrs...  BPH>  Stable on the Doxazosin (he states intol to FLomax); PSA today = 3.31...  DJD, Neck Pain, Back Pain, Neuropathy symptoms>  He is s/p bilat TKRs from Isaiah Wilson; he's had 2 prev neck surgeries from DrHirsh; he's had several foot operations as well; he had neg NCV of left arm  6/12 by Isaiah Wilson; he takes Ultram as needed;  We decided to give him a trial of a Pred Dosepak to see if it gives him a measure of relief...  Anxiety>  He has Alprazolam avail for prn use...   Patient's Medications  New Prescriptions   No medications on file  Previous Medications   ACETAMINOPHEN (TYLENOL ARTHRITIS EXT RELIEF PO)    Take 1 capsule by mouth as needed.   ALPRAZOLAM (XANAX) 0.5 MG TABLET    Take 1/2 to 1 tablet by mouth three times daily as needed for nerves   ASPIRIN 81 MG TABLET    Take 81 mg by mouth daily.     CIPROFLOXACIN-DEXAMETHASONE (CIPRODEX) OTIC SUSPENSION    Place 4 drops into both ears 2 (two) times daily.   DOXAZOSIN (CARDURA) 8 MG TABLET    Take 1 tablet (8 mg total) by mouth at bedtime.   DYMISTA 137-50 MCG/ACT SUSP    Place 2 sprays into the nose daily.    EPIPEN 2-PAK 0.3 MG/0.3ML DEVI    As needed   FLUTICASONE (VERAMYST) 27.5 MCG/SPRAY NASAL SPRAY    Place 2 sprays into the nose daily.   LORATADINE (CLARITIN) 10 MG TABLET    Take 10 mg by mouth daily.   LYSINE 500 MG CAPS    Take 1 capsule by mouth daily.     MULTIPLE VITAMINS-MINERALS (CENTRUM) TABLET    Take 1 tablet by mouth daily.     NEXIUM 40 MG CAPSULE    TAKE ONE CAPSULE (40 MG TOTAL) BY MOUTH DAILY BEFORE BREAKFAST.   TRAMADOL (ULTRAM) 50 MG TABLET    Take 1 tablet up to three times a day as needed for pain   UNABLE TO FIND    Med Name: allergy shots weekly   ZINC GLUCONATE 50 MG TABLET    Take 50 mg by mouth daily.    Modified Medications   No medications on file  Discontinued Medications   ISOMETHEPTENE-ACETAMINOPHEN-DICHLORALPHENAZONE (MIDRIN) 65-325-100 MG CAPSULE    Take 1 capsule by mouth every 6 (six) hours as needed for migraine.

## 2013-02-01 ENCOUNTER — Other Ambulatory Visit: Payer: Self-pay | Admitting: Pulmonary Disease

## 2013-02-08 ENCOUNTER — Ambulatory Visit (INDEPENDENT_AMBULATORY_CARE_PROVIDER_SITE_OTHER): Payer: Medicare Other | Admitting: Pulmonary Disease

## 2013-02-08 ENCOUNTER — Ambulatory Visit (INDEPENDENT_AMBULATORY_CARE_PROVIDER_SITE_OTHER)
Admission: RE | Admit: 2013-02-08 | Discharge: 2013-02-08 | Disposition: A | Discharge status: Home / Self Care | Payer: Medicare Other | Source: Ambulatory Visit | Attending: Pulmonary Disease | Admitting: Pulmonary Disease

## 2013-02-08 ENCOUNTER — Other Ambulatory Visit (INDEPENDENT_AMBULATORY_CARE_PROVIDER_SITE_OTHER): Payer: Medicare Other

## 2013-02-08 ENCOUNTER — Encounter: Payer: Self-pay | Admitting: Pulmonary Disease

## 2013-02-08 VITALS — BP 130/72 | HR 58 | Temp 98.4°F | Ht 69.0 in | Wt 249.0 lb

## 2013-02-08 DIAGNOSIS — L821 Other seborrheic keratosis: Secondary | ICD-10-CM | POA: Insufficient documentation

## 2013-02-08 DIAGNOSIS — K219 Gastro-esophageal reflux disease without esophagitis: Secondary | ICD-10-CM

## 2013-02-08 DIAGNOSIS — J309 Allergic rhinitis, unspecified: Secondary | ICD-10-CM

## 2013-02-08 DIAGNOSIS — M1990 Unspecified osteoarthritis, unspecified site: Secondary | ICD-10-CM

## 2013-02-08 DIAGNOSIS — F411 Generalized anxiety disorder: Secondary | ICD-10-CM

## 2013-02-08 DIAGNOSIS — Z23 Encounter for immunization: Secondary | ICD-10-CM

## 2013-02-08 DIAGNOSIS — K2270 Barrett's esophagus without dysplasia: Secondary | ICD-10-CM

## 2013-02-08 DIAGNOSIS — G589 Mononeuropathy, unspecified: Secondary | ICD-10-CM

## 2013-02-08 DIAGNOSIS — E663 Overweight: Secondary | ICD-10-CM

## 2013-02-08 DIAGNOSIS — I10 Essential (primary) hypertension: Secondary | ICD-10-CM

## 2013-02-08 DIAGNOSIS — Z87898 Personal history of other specified conditions: Secondary | ICD-10-CM

## 2013-02-08 DIAGNOSIS — E785 Hyperlipidemia, unspecified: Secondary | ICD-10-CM

## 2013-02-08 LAB — HEPATIC FUNCTION PANEL
Bilirubin, Direct: 0.1 mg/dL (ref 0.0–0.3)
Total Bilirubin: 0.8 mg/dL (ref 0.3–1.2)

## 2013-02-08 LAB — CBC WITH DIFFERENTIAL/PLATELET
Basophils Relative: 0.4 % (ref 0.0–3.0)
Eosinophils Absolute: 0.3 10*3/uL (ref 0.0–0.7)
HCT: 41.6 % (ref 39.0–52.0)
Hemoglobin: 14.2 g/dL (ref 13.0–17.0)
Lymphs Abs: 3 10*3/uL (ref 0.7–4.0)
MCHC: 34 g/dL (ref 30.0–36.0)
MCV: 87.4 fl (ref 78.0–100.0)
Monocytes Absolute: 0.7 10*3/uL (ref 0.1–1.0)
Neutro Abs: 5.1 10*3/uL (ref 1.4–7.7)
RBC: 4.75 Mil/uL (ref 4.22–5.81)

## 2013-02-08 LAB — BASIC METABOLIC PANEL
Calcium: 8.9 mg/dL (ref 8.4–10.5)
Creatinine, Ser: 0.9 mg/dL (ref 0.4–1.5)
Sodium: 139 mEq/L (ref 135–145)

## 2013-02-08 LAB — LIPID PANEL: HDL: 33.1 mg/dL — ABNORMAL LOW (ref >39.00)

## 2013-02-08 LAB — LDL CHOLESTEROL, DIRECT: Direct LDL: 104 mg/dL

## 2013-02-08 MED ORDER — ESOMEPRAZOLE MAGNESIUM 40 MG PO CPDR: 40.0000 mg | DELAYED_RELEASE_CAPSULE | Freq: Every day | ORAL | Status: DC

## 2013-02-08 MED ORDER — DOXAZOSIN MESYLATE 8 MG PO TABS: ORAL_TABLET | ORAL | Status: DC

## 2013-02-08 NOTE — Patient Instructions (Addendum)
Today we updated your med list in our EPIC system...    Continue your current medications the same...    We refilled the meds you requested...  Today we did your follow up CXR, EKG, & FASTING blood work...    We will contact you w/ the results when available...   Keep up the good work w/ diet & exercise...    The diet should be low carb/ low fat...    Work on further weight reduction...  Call for any questions...  Let's plan a follow up visit in 57mo, sooner if needed for problems.Marland KitchenMarland Kitchen

## 2013-02-08 NOTE — Progress Notes (Addendum)
Subjective:    Patient ID: Isaiah Wilson, male    DOB: Mar 03, 1935, 78 y.o.   MRN: [id number]  HPI 78 y/o WM here for a follow up visit...  he has multiple medical problems as noted below...   Followed for general medical purposes w/ hx HBP, Hyperlipidemia, Barrett's esoph, ischemic colitis in 2008, BPH, and DJD...  ~  February 09, 2011:  72mo ROV & doing well overall, no new complaints or concerns; he requests refill prescriptions for 30d supplies.Marland Kitchen    HBP> BP controlled on diet + Doxazosin; BP today= 138/72 & feeling well w/o HA, CP, palpit, dyspnea, edema, etc;     HYPERLIPID> His labs are not at the goal numbers as he has refused statin meds & prefers diet alone; we reviewed diet & wt reduction targets...    Overweight> His weight hasn't changed 10# in several yrs & today weighs in at 240# which equals a BMI of 33 in his 6' frame...    GERD/ Barrett's> Followed by DrPatterson w/ EGD Q81yrs due to Barrett's esoph; on Nexium 40mg /d & stable...    BPH> He is on the Doxazosin & states intol to Flomax; PSAs have been normal & he continues to void satis w/o problems reported...    DJD, Neuropathy> He c/o paresthesias involving his left arm from elbow down; DrRamos did NCV study 6/12 which was NEG- no neuropathy, radiculopathy, or CTS.    Anxiety> he has Alprazolam for prn use...  ~  August 09, 2011:  72mo ROV & Isaiah Wilson is stable just c/o arthritis "all over" which he treats w/ OTC analgesics, Tramadol, and warm soaks, etc;  He had eval by DrRamos w/ shots in is back but they didn't help AND they cost $4K, he says;  DrAlusio follows his bilat knee replacement status but unfortunately he still has pain R>L; we reviewed the importance of wt reduction & exercise... Today we reviewed his prob list, meds, prev XRays & labs> see prob list update below>>  ~  February 07, 2012:  72mo ROV & Isaiah Wilson is c/o an odd HA> states it shoots across his head from front to back & occurs ~once per week for the last month or so;  we discussed trial Midrin Rx & further eval if HA persists despite Rx...     He had Allergy recheck from DrWhelan> nasal spray changed to Dymista 1spBid (but he notes the Astepro+Flonase are cheaper) + saline irrigations prn...    He's been followed by DrByers for ENT> w/ CT Sinuses showing chronic sinusitis (no A/F levels but had mastoid effusion) & he performed endoscopic sinus surg, septoplasty, & right myringotomy tube 4/13; he notes that "my sinuses are a mess but the surg really helped my breathing"    He had GI recheck w/ EGD 8/13 by drPatterson f/u of his Barrett's esoph> 6cm segment of Barrett's mucosa biopsied= no dysplasia etc; No stricture, no mass, +mult gastric polyps    We reviewed prob list, meds, xrays and labs> see below >> OK flu shot... LABS 9/13:  FLP- not at goals on diet, needs to get wt down;  Chems- wnl;  CBC- wnl;  TSH=2.25;  PSA=2.31  ~  August 07, 2012:  72mo ROV & Isaiah Wilson is doing well, no new complaints or concerns; We reviewed the following medical problems during today's office visit >>     AR> on allergy shots, Dymista, Claritin, Flonase, Ciprodex per [facility] Hopes; CT Sinuses 4/13 showed chr sinusitis, no A/F levels, +OM &  mastoid effusion on right=> he had ENT surg DrByers...    HBP> BP controlled on diet + Doxazosin8; BP today= 130/74 & feeling well w/o HA, CP, palpit, dyspnea, edema, etc...    HYPERLIPID> he has refused statin meds & prefers diet alone; FLP 3/14 shows TChol 185, TG 127, HDL 37, LDL 123; we reviewed diet & wt reduction targets...    Overweight> His weight hasn't changed 10# in several yrs & today weighs in at 249# which equals a BMI of 34-5 in his 6' frame...    GERD/ Barrett's> Followed by DrPatterson w/ EGD Q21yrs due to Barrett's esoph- on Nexium 40mg /d & stable; last EGD was 8/13 & bx was neg- no dysplasia...    BPH> on Doxazosin8 & states intol to Flomax; PSAs have been normal & he continues to void satis w/o problems reported...    DJD, Neuropathy> on  Tramadol50; he c/o paresthesias involving his left arm from elbow down; DrRamos did NCV study 6/12 which was NEG- no neuropathy, radiculopathy, or CTS; He reports that DrBednarz did surg 11/13 on right leg & insurance doesn't want to pay...    Anxiety> on Alprazolam0.5mg  for prn use... We reviewed prob list, meds, xrays and labs> see below for updates >>   ~  February 08, 2013:  53mo ROV & Isaiah Wilson reports a good interval w/o new complaints or concerns;  We reviewed the following medical problems during today's office visit >>     AR> on allergy shots- DrWhelan, Dymista, Claritin, Flonase, Ciprodex per [facility]yman Hopes; CT Sinuses 4/13 showed chr sinusitis, no A/F levels, +OM & mastoid effusion on right=> he had ENT surg DrByers...    HBP> BP controlled on diet + Doxazosin8; BP today= 130/72 & feeling well w/o HA, CP, palpit, dyspnea, edema, etc...    HYPERLIPID> he has refused statin meds & prefers diet alone; FLP 9/14 shows TChol 175, TG 230, HDL 33, LDL 104; we reviewed low fat diet & wt reduction targets...    Overweight> His weight hasn't changed 10# in several yrs & today weighs in at 249# which equals a BMI of 34-5 in his 6' frame...    GERD/ Barrett's> Followed by DrPatterson w/ EGD Q11yrs due to Barrett's esoph- on Nexium 40mg /d & stable; last EGD was 8/13 & bx was neg- no dysplasia...    BPH> on Doxazosin8 & states intol to Flomax; PSAs have been normal (9/14 PSA=2.78) & he continues to void satis w/o problems reported...    DJD, Neuropathy> on Tramadol50; s/p bilat TKRs from DrAlusio; he's had 2 prev neck surgeries from DrHirsh; he's had several foot operations as well; he had neg NCV of left arm 6/12 by DrRamos (paresthesias in the arm).    Anxiety> off prev Alprazolam rx... We reviewed prob list, meds, xrays and labs> see below for updates >> OK Flu shot today & refill of all meds...  CXR 9/14 showed borderline heart size, 1.7cm density RUL ?etiology, surg in Cspine, DJD in Tspine => needs  CTChest  EKG 9/14 showed NSR, rate 64,  Old IWMI & poor R progression, no acute STTWA (no change from old tracing in 2009)...  LABS 9/14:  FLP- Chol ok on diet alone but TG=230, needs better diet;  Chems- wnl;  CBC- wnl;  TSH=2.15;  PSA=2.78...  CT Chest w/ contrast> done 9/22 & showed NO LESION in RUL, norm hrt size, coronary calcif seen, scat tiny pulm nodules (see report), hep steatosis, ?sm adrenal nodule, DJD in spine.Marland KitchenMarland Kitchen  Problem List:     ALLERGIC RHINITIS (ICD-477.9) - DrESL referred pt to [facility] for nasal polyps... on allergy shots weekly, plus Claritin, Astepro, Nasonex. ~  He had tubes placed in his ears by [facility], ENT in the past... ~  4/13: he had CT Sinuses showing chronic sinusitis (no A/F levels but had mastoid effusion) & DrByers performed endoscopic sinus surg, septoplasty, & right myringotomy tube 4/13; he notes that "my sinuses are a mess but the surg really helped my breathing" ~  9/14: on allergy shots- DrWhelan, Dymista, Claritin, Flonase, Ciprodex per Hyman Hopes; CT Sinuses 4/13 showed chr sinusitis, no A/F levels, +OM & mastoid effusion on right=> he had ENT surg DrByers.  ?Abn CXR & CT Chest>>  ~  CXR 9/12 showed borderline heart size, clear lungs, CSpine fusion & thor spine degen changes... ~  CXR 9/14 showed borderline heart size, 1.7cm density RUL ?etiology, surg in Cspine, DJD in Tspine => needs CTChest. ~  CT Chest w/ contrast> done 9/22 & showed NO LESION in RUL, norm hrt size, coronary calcif seen, scat tiny pulm nodules (see report), hep steatosis, ?sm adrenal nodule, DJD in spine.  HYPERTENSION (ICD-401.9) - controlled on ASA 81mg /d, & DOXAZOSIN 8mg /d... ~  he had a negative Cardiolite in 2001 and normal CDoppler's in 2006. ~  9/12:  BP=138/72 and he feels well; doesn't check BP at home but it's OK at the drug store; denies HA, fatigue, visual changes, CP, palipit, dizziness, syncope, dyspnea, edema, etc... ~  CXR 9/12 showed borderline  heart size, clear lungs, CSpine fusion & thor spine degen changes... ~  3/13:  BP= 140/78 & he remains asymptomatic> denies HA, CP, palpit, SOB, edema... ~  9/13:  BP= 140/72 & he remains asymptomatic... ~  3/14: BP controlled on diet + Doxazosin8; BP today= 130/74 & feeling well w/o HA, CP, palpit, dyspnea, edema, etc... ~  9/14: BP controlled on diet + Doxazosin8; BP today= 130/72 & he remains largely asymptomatic...  HYPERLIPIDEMIA (ICD-272.4) - on diet alone... he has repeatedly refused Statin Rx. ~  FLP 8/07 showed TChol 189, TG 200, HDL 32, LDL 117... rec- med Rx, but he preferred diet. ~  FLP 8/09 showed TChol 179, TG 94, HDL 38, LDL 122... rec- consider meds, he refuses. ~  FLP 8/10 showed TChol 186, TG 140, HDL 37, LDL 121... better diet, get wt down. ~  FLP 7/11 showed TChol 198, TG 159, HDL 41, LDL 125... he continues to decline statin Rx. ~  FLP 3/12 showed TChol 184, TG 146, HDL 37, LDL 118 ~  FLP 9/12 showed TChol 166, TG 73, HDL 44, LDL 108 ~  FLP 9/13 on diet alone showed TChol 202, TG 168, HDL 45, LDL 128... He doesn't want meds, needs to lose wt. ~  FLP 3/14 on diet alone showed TChol 185, TG 127, HDL 37, LDL 123 ~  FLP 9/14 on diet alone showed TChol 175, TG 230, HDL 33, LDL 104   OVERWEIGHT (ICD-278.02) - we discussed diet + exercise program sufficient to lose weight. ~  weight 2/10 = 239#  ~  weight 8/10 = 244# ~  weight 1/11 = 238# ~  weight 7/11 = 240# ~  weight 3/12 = 249#... we reviewed need for diet + exercise. ~  Weight 9/12 = 240# ~  Weight 3/13 = 244# ~  Weight 9/13 = 247# ~  Weight 3/14 = 249# ~  Weight 9/14 = 249#  GERD (ICD-530.81) & BARRETTS ESOPHAGUS (ICD-530.85) -  followed by DrPatterson/Brodie> on NEXIUM 40mg /d... ~  EGD 7/08 showed HH and Barrett's mucosa (no dysplasia)... ~  EGD 8/10 showed sm hyperplastic polyps, & 5cm segment of Barrett's mucosa... f/u planned 27yrs. ~  EGD 8/13 by DrPatterson showed 6cm segment of Barrett's mucosa biopsied= no  dysplasia etc; No stricture, no mass, +mult gastric polyps  Hx of ISCHEMIC COLITIS (ICD-557.9) - Hosp 5/08 w/ abd pain & lower GIB> eval revealed prob ischemic colitis... he had a f/u colonoscopy 7/08 which was normal without colitis... no recurrent symptoms to date...  BENIGN PROSTATIC HYPERTROPHY, HX OF (ICD-V13.8) - hx BPH and UTI in the past... DOXAZOSIN helps... INTOL Flomax "it affects my eyes"; added Uroxatrol 10mg /d 8/10 and improved nocturia & LTOS, he has stopped this med & states voiding well... ~  labs 8/07 showed PSA= 2.6 ~  labs 8/09 showed PSA= 2.04 ~  labs 8/10 showed PSA= 2.54 ~  labs 7/11 showed PSA= 2.76 ~  Labs 9/12 showed PSA= 3.31 ~  Labs 9/13 showed PSA= 2.31 ~  Labs 9/14 showed PSA= 2.78  DEGENERATIVE JOINT DISEASE (ICD-715.90) - S/P Right TKR 2/08 & Left TKR 2/09 DrAlusio & 2 prev neck surg by Carles Collet... he notes incr LBP as well... ~  7/11: he reports 2 operations left foot by podiatrist for bunion & toe straightening. ~  10/11: he has 3rd operation on right foot, this one from DrBednarz, & finally improved. ~  2012:  He reports shots in his back by DrRamos didn't help... ~  3/14: on Tramadol50; he c/o paresthesias involving his left arm from elbow down; DrRamos did NCV study 6/12 which was NEG- no neuropathy, radiculopathy, or CTS; He reports that DrBednarz did surg 11/13 on right leg & insurance doesn't want to pay...  NEUROPATHY (ICD-355.9) - prev tried Lyrica- ?benefit... ~  C/p paresthesias in left arm & NCVs from DrRamos were neg- no neuropathy, no radiculopathy, no evid CTS...  ANXIETY (ICD-300.00) - he is under mod stress and requests ALPRAZOLAM 0.5mg - 1/2 to 1 tab Tid Prn...  Health Maintenance:  he gets the yearly flu vaccine each fall... given PNEUMOVAX ~2007... given TDAP 1/11...   Past Medical History  Diagnosis Date  . Allergic rhinitis   . Hypertension   . Hyperlipidemia   . Overweight(278.02)   . GERD (gastroesophageal reflux disease)   .  Barrett's esophagus   . Colitis, ischemic   . Benign prostatic hypertrophy   . DJD (degenerative joint disease)   . Neuropathy   . Anxiety     Past Surgical History  Procedure Laterality Date  . Laparoscopic cholecystectomy  1993    Dr. Luan Moore  . 2 neck surgeries  2005-2006    Dr. Georgia Dom cx decompression, diskectomy,fusion C6-7 allograft  . Right total knee replacement  06/2006    Dr. Despina Hick  . Left total knee replacement  07/2007    Dr. Despina Hick  . Appendectomy    . Foot surgery      right x 3  . Septoplasty  1975, May 2013    x3    Outpatient Encounter Prescriptions as of 02/08/2013  Medication Sig Dispense Refill  . Acetaminophen (TYLENOL ARTHRITIS EXT RELIEF PO) Take 1 capsule by mouth as needed.      Marland Kitchen aspirin 81 MG tablet Take 81 mg by mouth daily.        . ciprofloxacin-dexamethasone (CIPRODEX) otic suspension Place 4 drops into both ears 2 (two) times daily.  7.5 mL  5  . doxazosin (  CARDURA) 8 MG tablet TAKE ONE TABLET BY MOUTH AT BEDTIME  30 tablet  5  . DYMISTA 137-50 MCG/ACT SUSP Place 2 sprays into the nose daily.       Marland Kitchen EPIPEN 2-PAK 0.3 MG/0.3ML DEVI As needed      . esomeprazole (NEXIUM) 40 MG capsule Take 1 capsule (40 mg total) by mouth daily before breakfast.  30 capsule  11  . fluticasone (VERAMYST) 27.5 MCG/SPRAY nasal spray Place 2 sprays into the nose daily.      Marland Kitchen loratadine (CLARITIN) 10 MG tablet Take 10 mg by mouth daily.      Marland Kitchen Lysine 500 MG CAPS Take 1 capsule by mouth daily.        . Multiple Vitamins-Minerals (CENTRUM) tablet Take 1 tablet by mouth daily.        . traMADol (ULTRAM) 50 MG tablet Take 1 tablet up to three times a day as needed for pain  90 tablet  5  . UNABLE TO FIND Med Name: allergy shots weekly      . zinc gluconate 50 MG tablet Take 50 mg by mouth daily.        . [DISCONTINUED] ALPRAZolam (XANAX) 0.5 MG tablet Take 1/2 to 1 tablet by mouth three times daily as needed for nerves  90 tablet  5   No facility-administered  encounter medications on file as of 02/08/2013.    Allergies  Allergen Reactions  . Tamsulosin     REACTION: pt states INTOL to Flomax - "it affects my eyes"    Current Medications, Allergies, Past Medical History, Past Surgical History, Family History, and Social History were reviewed in [employer] record.    Review of Systems         See HPI - all other systems neg except as noted...  The patient complains of dyspnea on exertion and difficulty walking.  The patient denies anorexia, fever, weight loss, weight gain, vision loss, decreased hearing, hoarseness, chest pain, syncope, peripheral edema, prolonged cough, headaches, hemoptysis, abdominal pain, melena, hematochezia, severe indigestion/heartburn, hematuria, incontinence, muscle weakness, suspicious skin lesions, transient blindness, depression, unusual weight change, abnormal bleeding, enlarged lymph nodes, and angioedema.     Objective:   Physical Exam     WD, Overweight, 78 y/o WM in NAD... GENERAL:  Alert & oriented; pleasant & cooperative... HEENT:  [place]/AT, EACs-clear, TMs-wnl, NOSE-clear, THROAT-clear & wnl. NECK:  Supple w/ fairROM; no JVD; normal carotid impulses w/o bruits; no thyromegaly or nodules palpated; no lymphadenopathy. CHEST:  Clear to P & A; without wheezes/ rales/ or rhonchi. HEART:  Regular Rhythm; without murmurs/ rubs/ or gallops. ABDOMEN:  Soft & nontender, w/ diastasis recti, normal bowel sounds; no organomegaly or masses detected. Groin- ?sm hernia on right, min tender, otherw neg. EXT: mod arthritic changes s/p bilat TKRs & right foot surg; no varicose veins/ venous insuffic/ or edema. NEURO:  CN's intact; no focal neuro deficits... DERM:  no lesions noted...  RADIOLOGY DATA:  Reviewed in the EPIC EMR & discussed w/ the patient...  LABORATORY DATA:  Reviewed in the EPIC EMR & discussed w/ the patient...   Assessment & Plan:   CXR 9/14 w/ ?RUL lesion => CT Chest showed NO  LESION in RUL...   AR>  Followed by DrWhelan & still on allergy shots, Claritin, Nasonex, Astepro, etc from the allergist; he also sees DrByers for ENT w/ sinus surg 4/13...  HBP>  BP controlloed on diet + Doxazosin8; continue same...  HYPERLIPID>  On  diet alone & numbers not at goals, needs better low fat diet, exercise, wt reduction...  Overweight>  As noted his wt is approx the same over the last several yrs & we discussed the need for fewer calories & more exercise!!!  GERD, Barrett's Esoph>  Followed by DrPatterson on Nexium daily; stable EGDs every 2-3 yrs...  BPH>  Stable on the Doxazosin (he states intol to FLomax); PSA today = 3.31...  DJD, Neck Pain, Back Pain, Neuropathy symptoms>  He is s/p bilat TKRs from DrAlusio; he's had 2 prev neck surgeries from DrHirsh; he's had several foot operations as well; he had neg NCV of left arm 6/12 by DrRamos; he takes Ultram as needed;  We decided to give him a trial of a Pred Dosepak to see if it gives him a measure of relief...  Anxiety>  He has Alprazolam avail for prn use...   Patient's Medications  New Prescriptions   No medications on file  Previous Medications   ACETAMINOPHEN (TYLENOL ARTHRITIS EXT RELIEF PO)    Take 1 capsule by mouth as needed.   ASPIRIN 81 MG TABLET    Take 81 mg by mouth daily.     CIPROFLOXACIN-DEXAMETHASONE (CIPRODEX) OTIC SUSPENSION    Place 4 drops into both ears 2 (two) times daily.   DYMISTA 137-50 MCG/ACT SUSP    Place 2 sprays into the nose daily.    EPIPEN 2-PAK 0.3 MG/0.3ML DEVI    As needed   FLUTICASONE (VERAMYST) 27.5 MCG/SPRAY NASAL SPRAY    Place 2 sprays into the nose daily.   LORATADINE (CLARITIN) 10 MG TABLET    Take 10 mg by mouth daily.   LYSINE 500 MG CAPS    Take 1 capsule by mouth daily.     MULTIPLE VITAMINS-MINERALS (CENTRUM) TABLET    Take 1 tablet by mouth daily.     TRAMADOL (ULTRAM) 50 MG TABLET    Take 1 tablet up to three times a day as needed for pain   UNABLE TO FIND    Med  Name: allergy shots weekly   ZINC GLUCONATE 50 MG TABLET    Take 50 mg by mouth daily.    Modified Medications   Modified Medication Previous Medication   DOXAZOSIN (CARDURA) 8 MG TABLET doxazosin (CARDURA) 8 MG tablet      TAKE ONE TABLET BY MOUTH AT BEDTIME    TAKE ONE TABLET BY MOUTH AT BEDTIME   ESOMEPRAZOLE (NEXIUM) 40 MG CAPSULE esomeprazole (NEXIUM) 40 MG capsule      Take 1 capsule (40 mg total) by mouth daily before breakfast.    Take 1 capsule (40 mg total) by mouth daily before breakfast.  Discontinued Medications   ALPRAZOLAM (XANAX) 0.5 MG TABLET    Take 1/2 to 1 tablet by mouth three times daily as needed for nerves

## 2013-02-13 ENCOUNTER — Other Ambulatory Visit: Payer: Self-pay | Admitting: Pulmonary Disease

## 2013-02-13 DIAGNOSIS — R9389 Abnormal findings on diagnostic imaging of other specified body structures: Secondary | ICD-10-CM

## 2013-02-15 ENCOUNTER — Other Ambulatory Visit: Payer: Medicare Other

## 2013-02-18 ENCOUNTER — Ambulatory Visit (INDEPENDENT_AMBULATORY_CARE_PROVIDER_SITE_OTHER)
Admission: RE | Admit: 2013-02-18 | Discharge: 2013-02-18 | Disposition: A | Discharge status: Home / Self Care | Payer: Medicare Other | Source: Ambulatory Visit | Attending: Pulmonary Disease | Admitting: Pulmonary Disease

## 2013-02-18 DIAGNOSIS — R918 Other nonspecific abnormal finding of lung field: Secondary | ICD-10-CM

## 2013-02-18 DIAGNOSIS — R9389 Abnormal findings on diagnostic imaging of other specified body structures: Secondary | ICD-10-CM

## 2013-03-26 ENCOUNTER — Other Ambulatory Visit: Payer: Self-pay | Admitting: Pulmonary Disease

## 2013-03-26 ENCOUNTER — Telehealth: Payer: Self-pay | Admitting: Pulmonary Disease

## 2013-03-26 MED ORDER — TRAMADOL HCL 50 MG PO TABS: ORAL_TABLET | ORAL | Status: DC

## 2013-03-26 NOTE — Telephone Encounter (Signed)
I called [facility] gave VO. Nothing further needed

## 2013-07-10 ENCOUNTER — Other Ambulatory Visit: Payer: Self-pay | Admitting: *Deleted

## 2013-07-10 MED ORDER — ESOMEPRAZOLE MAGNESIUM 40 MG PO CPDR: 40.0000 mg | DELAYED_RELEASE_CAPSULE | Freq: Every day | ORAL | Status: DC

## 2013-08-02 ENCOUNTER — Other Ambulatory Visit: Payer: Self-pay | Admitting: Pulmonary Disease

## 2013-08-02 DIAGNOSIS — I10 Essential (primary) hypertension: Secondary | ICD-10-CM

## 2013-08-02 DIAGNOSIS — E785 Hyperlipidemia, unspecified: Secondary | ICD-10-CM

## 2013-08-05 ENCOUNTER — Other Ambulatory Visit: Payer: Self-pay | Admitting: Pulmonary Disease

## 2013-08-05 ENCOUNTER — Telehealth: Payer: Self-pay | Admitting: Pulmonary Disease

## 2013-08-05 DIAGNOSIS — F411 Generalized anxiety disorder: Secondary | ICD-10-CM

## 2013-08-05 DIAGNOSIS — Z87898 Personal history of other specified conditions: Secondary | ICD-10-CM

## 2013-08-05 DIAGNOSIS — I10 Essential (primary) hypertension: Secondary | ICD-10-CM

## 2013-08-05 DIAGNOSIS — E785 Hyperlipidemia, unspecified: Secondary | ICD-10-CM

## 2013-08-05 MED ORDER — DOXAZOSIN MESYLATE 8 MG PO TABS: ORAL_TABLET | ORAL | Status: DC

## 2013-08-05 NOTE — Telephone Encounter (Signed)
Please advise where pt can be worked in at? thanks 

## 2013-08-05 NOTE — Telephone Encounter (Signed)
Per SN---  Ok to schedule pt on 3-19 at 2:30

## 2013-08-05 NOTE — Telephone Encounter (Signed)
[facility] [address] [facility] on pt's Doxazosin 8mg  tab 1 po QHS Med last refilled 2.28.15 Pharmacist remark: Cycle fill medication.  Authorization is required from next refill Last ov w. SN 9.12.14, upcoming scheduled for 3.19.15  Rx sent to pharmacy Will sign off

## 2013-08-05 NOTE — Telephone Encounter (Signed)
Pt has been scheduled for 08/15/13 at 2:30pm. Orders have been placed for blood work to be done prior to appointment.

## 2013-08-08 ENCOUNTER — Ambulatory Visit: Payer: Medicare Other | Admitting: Pulmonary Disease

## 2013-08-15 ENCOUNTER — Ambulatory Visit (INDEPENDENT_AMBULATORY_CARE_PROVIDER_SITE_OTHER): Payer: Medicare Other | Admitting: Pulmonary Disease

## 2013-08-15 ENCOUNTER — Encounter (INDEPENDENT_AMBULATORY_CARE_PROVIDER_SITE_OTHER): Payer: Self-pay

## 2013-08-15 ENCOUNTER — Other Ambulatory Visit (INDEPENDENT_AMBULATORY_CARE_PROVIDER_SITE_OTHER): Payer: Medicare Other

## 2013-08-15 ENCOUNTER — Encounter: Payer: Self-pay | Admitting: Pulmonary Disease

## 2013-08-15 VITALS — BP 142/80 | HR 58 | Temp 97.0°F | Ht 69.0 in | Wt 253.8 lb

## 2013-08-15 DIAGNOSIS — E663 Overweight: Secondary | ICD-10-CM

## 2013-08-15 DIAGNOSIS — Z87898 Personal history of other specified conditions: Secondary | ICD-10-CM

## 2013-08-15 DIAGNOSIS — F411 Generalized anxiety disorder: Secondary | ICD-10-CM

## 2013-08-15 DIAGNOSIS — E785 Hyperlipidemia, unspecified: Secondary | ICD-10-CM

## 2013-08-15 DIAGNOSIS — G589 Mononeuropathy, unspecified: Secondary | ICD-10-CM

## 2013-08-15 DIAGNOSIS — M1990 Unspecified osteoarthritis, unspecified site: Secondary | ICD-10-CM

## 2013-08-15 DIAGNOSIS — K2270 Barrett's esophagus without dysplasia: Secondary | ICD-10-CM

## 2013-08-15 DIAGNOSIS — I10 Essential (primary) hypertension: Secondary | ICD-10-CM

## 2013-08-15 DIAGNOSIS — K219 Gastro-esophageal reflux disease without esophagitis: Secondary | ICD-10-CM

## 2013-08-15 DIAGNOSIS — N400 Enlarged prostate without lower urinary tract symptoms: Secondary | ICD-10-CM

## 2013-08-15 DIAGNOSIS — J309 Allergic rhinitis, unspecified: Secondary | ICD-10-CM

## 2013-08-15 LAB — CBC WITH DIFFERENTIAL/PLATELET
Basophils Absolute: 0 10*3/uL (ref 0.0–0.1)
Basophils Relative: 0.5 % (ref 0.0–3.0)
EOS ABS: 0.2 10*3/uL (ref 0.0–0.7)
Eosinophils Relative: 2.8 % (ref 0.0–5.0)
HCT: 42.3 % (ref 39.0–52.0)
Hemoglobin: 14.3 g/dL (ref 13.0–17.0)
LYMPHS PCT: 33.5 % (ref 12.0–46.0)
Lymphs Abs: 2.5 10*3/uL (ref 0.7–4.0)
MCHC: 33.7 g/dL (ref 30.0–36.0)
MCV: 88.4 fl (ref 78.0–100.0)
Monocytes Absolute: 0.6 10*3/uL (ref 0.1–1.0)
Monocytes Relative: 8.6 % (ref 3.0–12.0)
NEUTROS ABS: 4.1 10*3/uL (ref 1.4–7.7)
NEUTROS PCT: 54.6 % (ref 43.0–77.0)
Platelets: 196 10*3/uL (ref 150.0–400.0)
RBC: 4.78 Mil/uL (ref 4.22–5.81)
RDW: 13.9 % (ref 11.5–14.6)
WBC: 7.5 10*3/uL (ref 4.5–10.5)

## 2013-08-15 LAB — BASIC METABOLIC PANEL
BUN: 12 mg/dL (ref 6–23)
CALCIUM: 8.6 mg/dL (ref 8.4–10.5)
CO2: 26 mEq/L (ref 19–32)
Chloride: 108 mEq/L (ref 96–112)
Creatinine, Ser: 1 mg/dL (ref 0.4–1.5)
GFR: 76.26 mL/min (ref >60.00)
GLUCOSE: 116 mg/dL — AB (ref 70–99)
Potassium: 4.1 mEq/L (ref 3.5–5.1)
SODIUM: 141 meq/L (ref 135–145)

## 2013-08-15 LAB — HEPATIC FUNCTION PANEL
ALK PHOS: 52 U/L (ref 39–117)
ALT: 31 U/L (ref 0–53)
AST: 30 U/L (ref 0–37)
Albumin: 4.5 g/dL (ref 3.5–5.2)
BILIRUBIN DIRECT: 0.1 mg/dL (ref 0.0–0.3)
TOTAL PROTEIN: 7.3 g/dL (ref 6.0–8.3)
Total Bilirubin: 0.8 mg/dL (ref 0.3–1.2)

## 2013-08-15 LAB — LIPID PANEL
CHOLESTEROL: 197 mg/dL (ref 0–200)
HDL: 38 mg/dL — AB (ref >39.00)
LDL Cholesterol: 123 mg/dL — ABNORMAL HIGH (ref 0–99)
Total CHOL/HDL Ratio: 5
Triglycerides: 182 mg/dL — ABNORMAL HIGH (ref 0.0–149.0)
VLDL: 36.4 mg/dL (ref 0.0–40.0)

## 2013-08-15 LAB — PSA: PSA: 2.67 ng/mL (ref 0.10–4.00)

## 2013-08-15 LAB — TSH: TSH: 2.05 u[IU]/mL (ref 0.35–5.50)

## 2013-08-15 NOTE — Progress Notes (Signed)
Subjective:    Patient ID: Isaiah Wilson, male    DOB: 06-Feb-1935, 79 y.o.   MRN: [id number]  HPI 79 y/o WM here for a follow up visit...  he has multiple medical problems as noted below...   Followed for general medical purposes w/ hx HBP, Hyperlipidemia, Barrett's esoph, ischemic colitis in 2008, BPH, and DJD...  ~  February 07, 2012:  65mo ROV & Isaiah Wilson is c/o an odd HA> states it shoots across his head from front to back & occurs ~once per week for the last month or so; we discussed trial Midrin Rx & further eval if HA persists despite Rx...     He had Allergy recheck from Isaiah Wilson> nasal spray changed to Dymista 1spBid (but he notes the Astepro+Flonase are cheaper) + saline irrigations prn...    He's been followed by Isaiah Wilson for ENT> w/ CT Sinuses showing chronic sinusitis (no A/F levels but had mastoid effusion) & he performed endoscopic sinus surg, septoplasty, & right myringotomy tube 4/13; he notes that "my sinuses are a mess but the surg really helped my breathing"    He had GI recheck w/ EGD 8/13 by Isaiah Wilson f/u of his Barrett's esoph> 6cm segment of Barrett's mucosa biopsied= no dysplasia etc; No stricture, no mass, +mult gastric polyps    We reviewed prob list, meds, xrays and labs> see below >> OK flu shot...  LABS 9/13:  FLP- not at goals on diet, needs to get wt down;  Chems- wnl;  CBC- wnl;  TSH=2.25;  PSA=2.31  ~  August 07, 2012:  65mo ROV & Isaiah Wilson is doing well, no new complaints or concerns; We reviewed the following medical problems during today's office visit >>     AR> on allergy shots, Dymista, Claritin, Flonase, Ciprodex per Isaiah Wilson; CT Sinuses 4/13 showed chr sinusitis, no A/F levels, +OM & mastoid effusion on right=> he had ENT surg Isaiah Wilson...    HBP> BP controlled on diet + Doxazosin8; BP today= 130/74 & feeling well w/o HA, CP, palpit, dyspnea, edema, etc...    HYPERLIPID> he has refused statin meds & prefers diet alone; FLP 3/14 shows TChol 185, TG 127, HDL 37,  LDL 123; we reviewed diet & wt reduction targets...    Overweight> His weight hasn't changed 10# in several yrs & today weighs in at 249# which equals a BMI of 34-5 in his 6' frame...    GERD/ Barrett's> Followed by Isaiah Wilson w/ EGD Q34yrs due to Barrett's esoph- on Nexium 40mg /d & stable; last EGD was 8/13 & bx was neg- no dysplasia...    BPH> on Doxazosin8 & states intol to Flomax; PSAs have been normal & he continues to void satis w/o problems reported...    DJD, Neuropathy> on Tramadol50; he c/o paresthesias involving his left arm from elbow down; Isaiah Wilson did NCV study 6/12 which was NEG- no neuropathy, radiculopathy, or CTS; He reports that Isaiah Wilson did surg 11/13 on right leg & insurance doesn't want to pay...    Anxiety> on Alprazolam0.5mg  for prn use... We reviewed prob list, meds, xrays and labs> see below for updates >>   ~  February 08, 2013:  65mo ROV & Isaiah Wilson reports a good interval w/o new complaints or concerns;  We reviewed the following medical problems during today's office visit >>     AR> on allergy shots- Isaiah Wilson, Dymista, Claritin, Flonase, Ciprodex per Isaiah Wilson; CT Sinuses 4/13 showed chr sinusitis, no A/F levels, +OM & mastoid effusion on right=> he had ENT  surg Isaiah Wilson...    HBP> BP controlled on diet + Doxazosin8; BP today= 130/72 & feeling well w/o HA, CP, palpit, dyspnea, edema, etc...    HYPERLIPID> he has refused statin meds & prefers diet alone; FLP 9/14 shows TChol 175, TG 230, HDL 33, LDL 104; we reviewed low fat diet & wt reduction targets...    Overweight> His weight hasn't changed 10# in several yrs & today weighs in at 249# which equals a BMI of 34-5 in his 6' frame...    GERD/ Barrett's> Followed by Isaiah Wilson w/ EGD Q4yrs due to Barrett's esoph- on Nexium 40mg /d & stable; last EGD was 8/13 & bx was neg- no dysplasia...    BPH> on Doxazosin8 & states intol to Flomax; PSAs have been normal (9/14 PSA=2.78) & he continues to void satis w/o problems reported...     DJD, Neuropathy> on Tramadol50; s/p bilat TKRs from Isaiah Wilson; he's had 2 prev neck surgeries from Isaiah Wilson; he's had several foot operations as well; he had neg NCV of left arm 6/12 by Isaiah Wilson (paresthesias in the arm).    Anxiety> off prev Alprazolam rx... We reviewed prob list, meds, xrays and labs> see below for updates >> OK Flu shot today & refill of all meds...  CXR 9/14 showed borderline heart size, 1.7cm density RUL ?etiology, surg in Cspine, DJD in Tspine => needs CTChest  EKG 9/14 showed NSR, rate 64,  Old IWMI & poor R progression, no acute STTWA (no change from old tracing in 2009)...  LABS 9/14:  FLP- Chol ok on diet alone but TG=230, needs better diet;  Chems- wnl;  CBC- wnl;  TSH=2.15;  PSA=2.78...  CT Chest w/ contrast> done 9/22 & showed NO LESION in RUL, norm hrt size, coronary calcif seen, scat tiny pulm nodules (see report), hep steatosis, ?sm adrenal nodule, DJD in spine...   ~  August 15, 2013:  9mo ROV & Isaiah Wilson reports doing satis- stable, no new complaints or concerns...    AR> on allergy shots from Isaiah Wilson, Dymista, Claritin, Flonase, Ciprodex per Isaiah Wilson; CT Sinuses 4/13 showed chr sinusitis, no A/F levels, +OM & mastoid effusion on right=> he had ENT surg Isaiah Wilson...    HBP> BP controlled on diet + Doxazosin8; BP today= 142/80 & feeling well w/o HA, CP, palpit, dyspnea, edema, etc...    HYPERLIPID> he has refused statin meds & prefers diet alone; FLP 3/15 shows TChol 197, TG 182, HDL 38, LDL 123; we reviewed low fat diet & wt reduction targets...    Overweight> His weight hasn't changed 10# in several yrs & today weighs in at 254# which equals a BMI of 35 in his 6' frame...    GERD/ Barrett's> Followed by Isaiah Wilson w/ EGD Q66yrs due to Barrett's esoph- on Nexium 40mg /d & stable; last EGD was 8/13 & bx was neg- no dysplasia...    BPH> on Doxazosin8 & states intol to Flomax; PSAs have been normal (3/15 PSA=2.67) & he continues to void satis w/o problems reported...     DJD, Neuropathy> on Tramadol50; s/p bilat TKRs from Isaiah Wilson; he's had 2 prev neck surgeries from Isaiah Wilson; he's had several foot operations as well; he had neg NCV of left arm 6/12 by Isaiah Wilson (paresthesias in the arm).    Anxiety> off prev Alprazolam rx... We reviewed prob list, meds, xrays and labs> see below for updates >>  LABS 3/15:  FLP- not at goals w/ TG=182, LDL=123;  Chems- wnl x BS=116;  CBC- wnl;  TSH=2.05;  PSA=2.67.Marland KitchenMarland Kitchen  Problem List:     ALLERGIC RHINITIS (ICD-477.9) - [facility]... on allergy shots weekly, plus Claritin, Astepro, Nasonex. ~  [facility] the [facility]... ~  4/13: he had CT Sinuses showing chronic sinusitis (no A/F levels but had mastoid effusion) & Isaiah Wilson performed endoscopic sinus surg, septoplasty, & right myringotomy tube 4/13; he notes that "my sinuses are a mess but the surg really helped my breathing" ~  9/14: on allergy shots- Isaiah Wilson, Dymista, Claritin, Flonase, Ciprodex per Isaiah HopesrSharma etal; CT Sinuses 4/13 showed chr sinusitis, no A/F levels, +OM & mastoid effusion on right=> he had ENT surg Isaiah Wilson. ~  3/15: he continues on same regimen & stable...  ?Abn CXR & CT Chest>>  ~  CXR 9/12 showed borderline heart size, clear lungs, CSpine fusion & thor spine degen changes... ~  CXR 9/14 showed borderline heart size, 1.7cm density RUL ?etiology, surg in Cspine, DJD in Tspine => needs CTChest. ~  CT Chest w/ contrast> done 9/14 & showed NO LESION in RUL, norm hrt size, coronary calcif seen, scat tiny pulm nodules (see report), hep steatosis, ?sm adrenal nodule, DJD in spine.  HYPERTENSION (ICD-401.9) - controlled on ASA 81mg /d, & DOXAZOSIN 8mg /d... ~  he had a negative Cardiolite in 2001 and normal CDoppler's in 2006. ~  9/12:  BP=138/72 and he feels well; doesn't check BP at home but it's OK at the drug store; denies HA, fatigue, visual changes, CP, palipit, dizziness, syncope,  dyspnea, edema, etc... ~  CXR 9/12 showed borderline heart size, clear lungs, CSpine fusion & thor spine degen changes... ~  3/13:  BP= 140/78 & he remains asymptomatic> denies HA, CP, palpit, SOB, edema... ~  9/13:  BP= 140/72 & he remains asymptomatic... ~  3/14: BP controlled on diet + Doxazosin8; BP today= 130/74 & feeling well w/o HA, CP, palpit, dyspnea, edema, etc... ~  9/14: BP controlled on diet + Doxazosin8; BP today= 130/72 & he remains largely asymptomatic... ~  EKG 9/14 showed NSR, rate64, LAD, poor R progression...  ~  3/15: BP controlled on diet + Doxazosin8; BP today= 142/80 & feeling well w/o HA, CP, palpit, dyspnea, edema, etc...  HYPERLIPIDEMIA (ICD-272.4) - on diet alone... he has repeatedly refused Statin Rx. ~  FLP 8/07 showed TChol 189, TG 200, HDL 32, LDL 117... rec- med Rx, but he preferred diet. ~  FLP 8/09 showed TChol 179, TG 94, HDL 38, LDL 122... rec- consider meds, he refuses. ~  FLP 8/10 showed TChol 186, TG 140, HDL 37, LDL 121... better diet, get wt down. ~  FLP 7/11 showed TChol 198, TG 159, HDL 41, LDL 125... he continues to decline statin Rx. ~  FLP 3/12 showed TChol 184, TG 146, HDL 37, LDL 118 ~  FLP 9/12 showed TChol 166, TG 73, HDL 44, LDL 108 ~  FLP 9/13 on diet alone showed TChol 202, TG 168, HDL 45, LDL 128... He doesn't want meds, needs to lose wt. ~  FLP 3/14 on diet alone showed TChol 185, TG 127, HDL 37, LDL 123 ~  FLP 9/14 on diet alone showed TChol 175, TG 230, HDL 33, LDL 104  ~  FLP 3/15 on diet alone showed TChol 197, TG 182, HDL 38, LDL 123... Needs better diet 7 wt reduction...  OVERWEIGHT (ICD-278.02) - we discussed diet + exercise program sufficient to lose weight. ~  weight 2/10 = 239#  ~  weight  8/10 = 244# ~  weight 1/11 = 238# ~  weight 7/11 = 240# ~  weight 3/12 = 249#... we reviewed need for diet + exercise. ~  Weight 9/12 = 240# ~  Weight 3/13 = 244# ~  Weight 9/13 = 247# ~  Weight 3/14 = 249# ~  Weight 9/14 = 249# ~   Weight 3/15 = 254#  GERD (ICD-530.81) & BARRETTS ESOPHAGUS (ICD-530.85) - followed by Isaiah Wilson/Brodie> on NEXIUM 40mg /d... ~  EGD 7/08 showed HH and Barrett's mucosa (no dysplasia)... ~  EGD 8/10 showed sm hyperplastic polyps, & 5cm segment of Barrett's mucosa... f/u planned 35yrs. ~  EGD 8/13 by Isaiah Wilson showed 6cm segment of Barrett's mucosa biopsied= no dysplasia etc; No stricture, no mass, +mult gastric polyps  Hx of ISCHEMIC COLITIS (ICD-557.9) - Hosp 5/08 w/ abd pain & lower GIB> eval revealed prob ischemic colitis... he had a f/u colonoscopy 7/08 which was normal without colitis... no recurrent symptoms to date...  BENIGN PROSTATIC HYPERTROPHY, HX OF (ICD-V13.8) - hx BPH and UTI in the past... DOXAZOSIN helps... INTOL Flomax "it affects my eyes"; added Uroxatrol 10mg /d 8/10 and improved nocturia & LTOS, he has stopped this med & states voiding well... ~  labs 8/07 showed PSA= 2.6 ~  labs 8/09 showed PSA= 2.04 ~  labs 8/10 showed PSA= 2.54 ~  labs 7/11 showed PSA= 2.76 ~  Labs 9/12 showed PSA= 3.31 ~  Labs 9/13 showed PSA= 2.31 ~  Labs 9/14 showed PSA= 2.78  DEGENERATIVE JOINT DISEASE (ICD-715.90) - S/P Right TKR 2/08 & Left TKR 2/09 Isaiah Wilson & 2 prev neck surg by Carles Collet... he notes incr LBP as well... ~  7/11: he reports 2 operations left foot by podiatrist for bunion & toe straightening. ~  10/11: he has 3rd operation on right foot, this one from Isaiah Wilson, & finally improved. ~  2012:  He reports shots in his back by Isaiah Wilson didn't help... ~  3/14: on Tramadol50; he c/o paresthesias involving his left arm from elbow down; Isaiah Wilson did NCV study 6/12 which was NEG- no neuropathy, radiculopathy, or CTS; He reports that Isaiah Wilson did surg 11/13 on right leg & insurance doesn't want to pay...  NEUROPATHY (ICD-355.9) - prev tried Lyrica- ?benefit... ~  C/p paresthesias in left arm & NCVs from Isaiah Wilson were neg- no neuropathy, no radiculopathy, no evid CTS...  ANXIETY (ICD-300.00) -  he is under mod stress and requests ALPRAZOLAM 0.5mg - 1/2 to 1 tab Tid Prn...  Health Maintenance:  he gets the yearly flu vaccine each fall... given PNEUMOVAX ~2007... given TDAP 1/11...   Past Medical History  Diagnosis Date  . Allergic rhinitis   . Hypertension   . Hyperlipidemia   . Overweight   . GERD (gastroesophageal reflux disease)   . Barrett's esophagus   . Colitis, ischemic   . Benign prostatic hypertrophy   . DJD (degenerative joint disease)   . Neuropathy   . Anxiety     Past Surgical History  Procedure Laterality Date  . Laparoscopic cholecystectomy  1993    Dr. Luan Moore  . 2 neck surgeries  2005-2006    Dr. Georgia Dom cx decompression, diskectomy,fusion C6-7 allograft  . Right total knee replacement  06/2006    Dr. Despina Hick  . Left total knee replacement  07/2007    Dr. Despina Hick  . Appendectomy    . Foot surgery      right x 3  . Septoplasty  1975, May 2013    x3  Outpatient Encounter Prescriptions as of 08/15/2013  Medication Sig  . Acetaminophen (TYLENOL ARTHRITIS EXT RELIEF PO) Take 2 capsules by mouth 2 (two) times daily.   Marland Kitchen aspirin 81 MG tablet Take 81 mg by mouth daily.    . ciprofloxacin-dexamethasone (CIPRODEX) otic suspension Place 4 drops into both ears 2 (two) times daily.  Marland Kitchen doxazosin (CARDURA) 8 MG tablet TAKE ONE TABLET BY MOUTH AT BEDTIME  . DYMISTA 137-50 MCG/ACT SUSP Place 2 sprays into the nose daily.   Marland Kitchen EPIPEN 2-PAK 0.3 MG/0.3ML DEVI As needed  . esomeprazole (NEXIUM) 40 MG capsule Take 1 capsule (40 mg total) by mouth daily before breakfast.  . fluticasone (VERAMYST) 27.5 MCG/SPRAY nasal spray Place 2 sprays into the nose daily.  Marland Kitchen loratadine (CLARITIN) 10 MG tablet Take 10 mg by mouth daily.  Marland Kitchen Lysine 500 MG CAPS Take 1 capsule by mouth daily.    . Multiple Vitamins-Minerals (CENTRUM) tablet Take 1 tablet by mouth daily.    . traMADol (ULTRAM) 50 MG tablet Take 1 tablet up to three times a day as needed for pain  . UNABLE TO FIND Med  Name: allergy shots weekly  . zinc gluconate 50 MG tablet Take 50 mg by mouth daily.      Allergies  Allergen Reactions  . Tamsulosin     REACTION: pt states INTOL to Flomax - "it affects my eyes"    Current Medications, Allergies, Past Medical History, Past Surgical History, Family History, and Social History were reviewed in [employer] record.    Review of Systems         See HPI - all other systems neg except as noted...  The patient complains of dyspnea on exertion and difficulty walking.  The patient denies anorexia, fever, weight loss, weight gain, vision loss, decreased hearing, hoarseness, chest pain, syncope, peripheral edema, prolonged cough, headaches, hemoptysis, abdominal pain, melena, hematochezia, severe indigestion/heartburn, hematuria, incontinence, muscle weakness, suspicious skin lesions, transient blindness, depression, unusual weight change, abnormal bleeding, enlarged lymph nodes, and angioedema.     Objective:   Physical Exam     WD, Overweight, 79 y/o WM in NAD... GENERAL:  Alert & oriented; pleasant & cooperative... HEENT:  [place]/AT, EACs-clear, TMs-wnl, NOSE-clear, THROAT-clear & wnl. NECK:  Supple w/ fairROM; no JVD; normal carotid impulses w/o bruits; no thyromegaly or nodules palpated; no lymphadenopathy. CHEST:  Clear to P & A; without wheezes/ rales/ or rhonchi. HEART:  Regular Rhythm; without murmurs/ rubs/ or gallops. ABDOMEN:  Soft & nontender, w/ diastasis recti, normal bowel sounds; no organomegaly or masses detected. Groin- ?sm hernia on right, min tender, otherw neg. EXT: mod arthritic changes s/p bilat TKRs & right foot surg; no varicose veins/ venous insuffic/ or edema. NEURO:  CN's intact; no focal neuro deficits... DERM:  no lesions noted...  RADIOLOGY DATA:  Reviewed in the EPIC EMR & discussed w/ the patient...  LABORATORY DATA:  Reviewed in the EPIC EMR & discussed w/ the patient...   Assessment & Plan:    CXR  9/14 w/ ?RUL lesion => CT Chest showed NO LESION in RUL...   AR>  Followed by Isaiah Wilson & still on allergy shots, Claritin, Nasonex, Astepro, etc from the allergist; he also sees Isaiah Wilson for ENT w/ sinus surg 4/13...   HBP>  BP controlloed on diet + Doxazosin8; continue same...  HYPERLIPID>  On diet alone & numbers not at goals, needs better low fat diet, exercise, wt reduction...  Overweight>  As noted his wt is  approx the same over the last several yrs & we discussed the need for fewer calories & more exercise!!!  GERD, Barrett's Esoph>  Followed by Isaiah Wilson on Nexium daily; stable EGDs every 2-3 yrs...  BPH>  Stable on the Doxazosin (he states intol to FLomax); PSA today = 3.31...  DJD, Neck Pain, Back Pain, Neuropathy symptoms>  He is s/p bilat TKRs from Isaiah Wilson; he's had 2 prev neck surgeries from Isaiah Wilson; he's had several foot operations as well; he had neg NCV of left arm 6/12 by Isaiah Wilson; he takes Ultram as needed;  We decided to give him a trial of a Pred Dosepak to see if it gives him a measure of relief...  Anxiety>  He has Alprazolam avail for prn use...   Patient's Medications  New Prescriptions   No medications on file  Previous Medications   ACETAMINOPHEN (TYLENOL ARTHRITIS EXT RELIEF PO)    Take 2 capsules by mouth 2 (two) times daily.    ASPIRIN 81 MG TABLET    Take 81 mg by mouth daily.     CIPROFLOXACIN-DEXAMETHASONE (CIPRODEX) OTIC SUSPENSION    Place 4 drops into both ears 2 (two) times daily.   DOXAZOSIN (CARDURA) 8 MG TABLET    TAKE ONE TABLET BY MOUTH AT BEDTIME   DYMISTA 137-50 MCG/ACT SUSP    Place 2 sprays into the nose daily.    EPIPEN 2-PAK 0.3 MG/0.3ML DEVI    As needed   ESOMEPRAZOLE (NEXIUM) 40 MG CAPSULE    Take 1 capsule (40 mg total) by mouth daily before breakfast.   FLUTICASONE (VERAMYST) 27.5 MCG/SPRAY NASAL SPRAY    Place 2 sprays into the nose daily.   LORATADINE (CLARITIN) 10 MG TABLET    Take 10 mg by mouth daily.   LYSINE 500 MG CAPS     Take 1 capsule by mouth daily.     MULTIPLE VITAMINS-MINERALS (CENTRUM) TABLET    Take 1 tablet by mouth daily.     TRAMADOL (ULTRAM) 50 MG TABLET    Take 1 tablet up to three times a day as needed for pain   UNABLE TO FIND    Med Name: allergy shots weekly   ZINC GLUCONATE 50 MG TABLET    Take 50 mg by mouth daily.    Modified Medications   Modified Medication Previous Medication   TRAMADOL (ULTRAM) 50 MG TABLET traMADol (ULTRAM) 50 MG tablet      TAKE 1 TABLET UP TO THREE TIMES A DAY AS NEEDED FOR PAIN    Take 1 tablet up to three times a day as needed for pain  Discontinued Medications   No medications on file

## 2013-08-15 NOTE — Patient Instructions (Signed)
Today we updated your med list in our EPIC system...    Continue your current medications the same...    We refilled your meds per request...  Let's get on track w/ our diet & exercise program...  Call for any questions or if we can be of service in any way to you or your family.Marland Kitchen..Marland Kitchen

## 2013-11-08 ENCOUNTER — Other Ambulatory Visit: Payer: Self-pay | Admitting: Pulmonary Disease

## 2014-01-23 ENCOUNTER — Other Ambulatory Visit: Payer: Self-pay | Admitting: Pulmonary Disease

## 2014-02-17 ENCOUNTER — Ambulatory Visit (INDEPENDENT_AMBULATORY_CARE_PROVIDER_SITE_OTHER): Payer: Medicare Other | Admitting: Family Medicine

## 2014-02-17 ENCOUNTER — Encounter: Payer: Self-pay | Admitting: Family Medicine

## 2014-02-17 VITALS — BP 134/78 | HR 61 | Wt 245.0 lb

## 2014-02-17 DIAGNOSIS — M1990 Unspecified osteoarthritis, unspecified site: Secondary | ICD-10-CM

## 2014-02-17 DIAGNOSIS — J309 Allergic rhinitis, unspecified: Secondary | ICD-10-CM

## 2014-02-17 DIAGNOSIS — Z87898 Personal history of other specified conditions: Secondary | ICD-10-CM

## 2014-02-17 DIAGNOSIS — Z23 Encounter for immunization: Secondary | ICD-10-CM

## 2014-02-17 DIAGNOSIS — E785 Hyperlipidemia, unspecified: Secondary | ICD-10-CM

## 2014-02-17 DIAGNOSIS — K219 Gastro-esophageal reflux disease without esophagitis: Secondary | ICD-10-CM

## 2014-02-17 DIAGNOSIS — K2270 Barrett's esophagus without dysplasia: Secondary | ICD-10-CM

## 2014-02-17 DIAGNOSIS — E669 Obesity, unspecified: Secondary | ICD-10-CM | POA: Insufficient documentation

## 2014-02-17 MED ORDER — DOXAZOSIN MESYLATE 8 MG PO TABS: ORAL_TABLET | ORAL | Status: DC

## 2014-02-17 MED ORDER — ESOMEPRAZOLE MAGNESIUM 40 MG PO CPDR: 40.0000 mg | DELAYED_RELEASE_CAPSULE | Freq: Every day | ORAL | Status: DC

## 2014-02-17 MED ORDER — TRAMADOL HCL 50 MG PO TABS: ORAL_TABLET | ORAL | Status: DC

## 2014-02-17 NOTE — Progress Notes (Signed)
   Subjective:    Patient ID: Isaiah Wilson, male    DOB: 1934-09-05, 79 y.o.   MRN: [id number]  HPI Patient seen to establish care. History of dyslipidemia, perennial allergies, GERD with Barrett's esophagus changes, osteoarthritis, and BPH. Medications reviewed and compliant with all. GERD symptoms are stable. No dysphagia. He sees allergist regularly. He is getting immunotherapy.  Immunizations reviewed. Needs flu vaccine and also Prevnar.  No cardiac history. Nonsmoker. He has osteoarthritis which limits exercise. Occasionally cycles with exercise cycle. He's had weight gain in recent years. Poor compliance with diet and exercise.  Past Medical History  Diagnosis Date  . Allergic rhinitis   . Hypertension   . Hyperlipidemia   . Overweight(278.02)   . GERD (gastroesophageal reflux disease)   . Barrett's esophagus   . Colitis, ischemic   . Benign prostatic hypertrophy   . DJD (degenerative joint disease)   . Neuropathy   . Anxiety    Past Surgical History  Procedure Laterality Date  . Laparoscopic cholecystectomy  1993    Dr. Luan Moore  . 2 neck surgeries  2005-2006    Dr. Georgia Dom cx decompression, diskectomy,fusion C6-7 allograft  . Right total knee replacement  06/2006    Dr. Despina Hick  . Left total knee replacement  07/2007    Dr. Despina Hick  . Appendectomy    . Foot surgery      right x 3  . Septoplasty  1975, May 2013    x3    reports that he has never smoked. He has never used smokeless tobacco. He reports that he does not drink alcohol or use illicit drugs. family history is negative for Colon cancer, Stomach cancer, Rectal cancer, and Esophageal cancer. Allergies  Allergen Reactions  . Tamsulosin     REACTION: pt states INTOL to Flomax - "it affects my eyes"      Review of Systems  Constitutional: Negative for fatigue.  HENT: Positive for congestion.   Eyes: Negative for visual disturbance.  Respiratory: Negative for cough, chest tightness and shortness of  breath.   Cardiovascular: Negative for chest pain, palpitations and leg swelling.  Gastrointestinal: Negative for abdominal pain.  Endocrine: Negative for polydipsia and polyuria.  Genitourinary: Negative for dysuria.  Neurological: Negative for dizziness, syncope, weakness, light-headedness and headaches.       Objective:   Physical Exam  Constitutional: He appears well-developed and well-nourished.  Neck: Neck supple. No thyromegaly present.  Cardiovascular: Normal rate and regular rhythm.   Pulmonary/Chest: Effort normal and breath sounds normal. No respiratory distress. He has no wheezes. He has no rales.  Musculoskeletal: He exhibits no edema.          Assessment & Plan:  #1 health maintenance. Flu vaccine and Prevnar 13 given #2 history of GERD with Barrett's esophagus changes.  Continue Nexium #3 osteoarthritis involving multiple joints. #4 history of perennial allergies. Continues to see allergist and continue nasal sprays above. #5 history of dyslipidemia with high triglycerides and low HDL. He also apparently has some prediabetes from recent labs. We discussed weight loss and recheck fasting blood sugar followup in 6 months. #6 obesity.  We have advised weight loss

## 2014-02-17 NOTE — Patient Instructions (Signed)
Lose some weight and try to establish more consistent exercise

## 2014-02-17 NOTE — Progress Notes (Signed)
Pre visit review using our clinic review tool, if applicable. No additional management support is needed unless otherwise documented below in the visit note. 

## 2014-02-26 ENCOUNTER — Encounter: Payer: Self-pay | Admitting: Gastroenterology

## 2014-02-26 ENCOUNTER — Encounter: Payer: Self-pay | Admitting: Internal Medicine

## 2014-06-24 ENCOUNTER — Other Ambulatory Visit: Payer: Self-pay | Admitting: *Deleted

## 2014-06-24 NOTE — Telephone Encounter (Signed)
OK to refill for 6 months 

## 2014-06-25 MED ORDER — TRAMADOL HCL 50 MG PO TABS
ORAL_TABLET | ORAL | Status: DC
Start: 2014-06-25 — End: 2014-12-29

## 2014-07-15 ENCOUNTER — Other Ambulatory Visit: Payer: Self-pay

## 2014-07-15 MED ORDER — ESOMEPRAZOLE MAGNESIUM 40 MG PO CPDR: 40.0000 mg | DELAYED_RELEASE_CAPSULE | Freq: Every day | ORAL | Status: DC

## 2014-08-18 ENCOUNTER — Ambulatory Visit (INDEPENDENT_AMBULATORY_CARE_PROVIDER_SITE_OTHER): Payer: Medicare Other | Admitting: Family Medicine

## 2014-08-18 ENCOUNTER — Encounter: Payer: Self-pay | Admitting: Family Medicine

## 2014-08-18 VITALS — BP 140/90 | HR 66 | Temp 98.0°F | Wt 251.0 lb

## 2014-08-18 DIAGNOSIS — R7309 Other abnormal glucose: Secondary | ICD-10-CM

## 2014-08-18 DIAGNOSIS — R7303 Prediabetes: Secondary | ICD-10-CM

## 2014-08-18 DIAGNOSIS — I10 Essential (primary) hypertension: Secondary | ICD-10-CM

## 2014-08-18 DIAGNOSIS — E785 Hyperlipidemia, unspecified: Secondary | ICD-10-CM

## 2014-08-18 DIAGNOSIS — K219 Gastro-esophageal reflux disease without esophagitis: Secondary | ICD-10-CM

## 2014-08-18 LAB — LIPID PANEL
Cholesterol: 181 mg/dL (ref 0–200)
HDL: 41.2 mg/dL (ref >39.00)
LDL Cholesterol: 106 mg/dL — ABNORMAL HIGH (ref 0–99)
NonHDL: 139.8
Total CHOL/HDL Ratio: 4
Triglycerides: 170 mg/dL — ABNORMAL HIGH (ref 0.0–149.0)
VLDL: 34 mg/dL (ref 0.0–40.0)

## 2014-08-18 LAB — BASIC METABOLIC PANEL
BUN: 11 mg/dL (ref 6–23)
CHLORIDE: 106 meq/L (ref 96–112)
CO2: 28 meq/L (ref 19–32)
Calcium: 9.2 mg/dL (ref 8.4–10.5)
Creatinine, Ser: 1.03 mg/dL (ref 0.40–1.50)
GFR: 74.36 mL/min (ref >60.00)
GLUCOSE: 108 mg/dL — AB (ref 70–99)
Potassium: 4.7 mEq/L (ref 3.5–5.1)
SODIUM: 140 meq/L (ref 135–145)

## 2014-08-18 LAB — HEMOGLOBIN A1C: Hgb A1c MFr Bld: 6 % (ref 4.6–6.5)

## 2014-08-18 NOTE — Progress Notes (Signed)
   Subjective:    Patient ID: Isaiah Wilson, male    DOB: 04/10/1935, 80 y.o.   MRN: [id number]  HPI   Patient seen for medical follow-up. He has history of obesity, dyslipidemia, prediabetes, GERD with history of Barrett's esophagus, and osteoarthritis. He has history of BPH which is stable Cardura. GERD which has been stable on Nexium. He has osteoarthritis and has had bilateral knee replacements. Sedentary over the wintertime. He has gained about 6 pounds since last visit. He has not been exercising consistently other than some recent yard work. He has history of high triglycerides and low HDL. Never treated for hyperlipidemia medically.  Past Medical History  Diagnosis Date  . Allergic rhinitis   . Hypertension   . Hyperlipidemia   . Overweight(278.02)   . GERD (gastroesophageal reflux disease)   . Barrett's esophagus   . Colitis, ischemic   . Benign prostatic hypertrophy   . DJD (degenerative joint disease)   . Neuropathy   . Anxiety    Past Surgical History  Procedure Laterality Date  . Laparoscopic cholecystectomy  1993    Dr. Luan MooreP. Young  . 2 neck surgeries  2005-2006    Dr. Georgia DomHirsh--ant cx decompression, diskectomy,fusion C6-7 allograft  . Right total knee replacement  06/2006    Dr. Despina HickAlusio  . Left total knee replacement  07/2007    Dr. Despina HickAlusio  . Appendectomy    . Foot surgery      right x 3  . Septoplasty  1975, May 2013    x3    reports that he has never smoked. He has never used smokeless tobacco. He reports that he does not drink alcohol or use illicit drugs. family history is negative for Colon cancer, Stomach cancer, Rectal cancer, and Esophageal cancer. Allergies  Allergen Reactions  . Tamsulosin     REACTION: pt states INTOL to Flomax - "it affects my eyes"      Review of Systems  Constitutional: Negative for fatigue and unexpected weight change.  Eyes: Negative for visual disturbance.  Respiratory: Negative for cough, chest tightness and shortness of  breath.   Cardiovascular: Negative for chest pain, palpitations and leg swelling.  Endocrine: Negative for polydipsia and polyuria.  Genitourinary: Negative for dysuria.  Neurological: Negative for dizziness, syncope, weakness, light-headedness and headaches.       Objective:   Physical Exam  Constitutional: He appears well-developed and well-nourished. No distress.  HENT:  Mouth/Throat: Oropharynx is clear and moist.  Neck: Neck supple. No thyromegaly present.  Cardiovascular: Normal rate and regular rhythm.  Exam reveals no gallop.   Pulmonary/Chest: Effort normal and breath sounds normal. No respiratory distress. He has no wheezes. He has no rales.  Musculoskeletal: He exhibits no edema.  Lymphadenopathy:    He has no cervical adenopathy.          Assessment & Plan:  #1 history of prediabetes. Check A1c and repeat fasting blood sugar today. We discussed fact that he needs to lose some weight. Reduce sugars and starches. Handouts given. #2 elevated blood pressure. Repeat left arm seated after rest 140/96. We discussed possible addition of medications but he would like to try some weight loss and reassess 3 months #3 history of GERD which is well-controlled on Nexium #4 history of dyslipidemia. Repeat lipid panel

## 2014-08-18 NOTE — Patient Instructions (Signed)
Diabetes Mellitus and Food °It is important for you to manage your blood sugar (glucose) level. Your blood glucose level can be greatly affected by what you eat. Eating healthier foods in the appropriate amounts throughout the day at about the same time each day will help you control your blood glucose level. It can also help slow or prevent worsening of your diabetes mellitus. Healthy eating may even help you improve the level of your blood pressure and reach or maintain a healthy weight.  °HOW CAN FOOD AFFECT ME? °Carbohydrates °Carbohydrates affect your blood glucose level more than any other type of food. Your dietitian will help you determine how many carbohydrates to eat at each meal and teach you how to count carbohydrates. Counting carbohydrates is important to keep your blood glucose at a healthy level, especially if you are using insulin or taking certain medicines for diabetes mellitus. °Alcohol °Alcohol can cause sudden decreases in blood glucose (hypoglycemia), especially if you use insulin or take certain medicines for diabetes mellitus. Hypoglycemia can be a life-threatening condition. Symptoms of hypoglycemia (sleepiness, dizziness, and disorientation) are similar to symptoms of having too much alcohol.  °If your health care provider has given you approval to drink alcohol, do so in moderation and use the following guidelines: °· Women should not have more than one drink per day, and men should not have more than two drinks per day. One drink is equal to: °¨ 12 oz of beer. °¨ 5 oz of wine. °¨ 1½ oz of hard liquor. °· Do not drink on an empty stomach. °· Keep yourself hydrated. Have water, diet soda, or unsweetened iced tea. °· Regular soda, juice, and other mixers might contain a lot of carbohydrates and should be counted. °WHAT FOODS ARE NOT RECOMMENDED? °As you make food choices, it is important to remember that all foods are not the same. Some foods have fewer nutrients per serving than other  foods, even though they might have the same number of calories or carbohydrates. It is difficult to get your body what it needs when you eat foods with fewer nutrients. Examples of foods that you should avoid that are high in calories and carbohydrates but low in nutrients include: °· Trans fats (most processed foods list trans fats on the Nutrition Facts label). °· Regular soda. °· Juice. °· Candy. °· Sweets, such as cake, pie, doughnuts, and cookies. °· Fried foods. °WHAT FOODS CAN I EAT? °Have nutrient-rich foods, which will nourish your body and keep you healthy. The food you should eat also will depend on several factors, including: °· The calories you need. °· The medicines you take. °· Your weight. °· Your blood glucose level. °· Your blood pressure level. °· Your cholesterol level. °You also should eat a variety of foods, including: °· Protein, such as meat, poultry, fish, tofu, nuts, and seeds (lean animal proteins are best). °· Fruits. °· Vegetables. °· Dairy products, such as milk, cheese, and yogurt (low fat is best). °· Breads, grains, pasta, cereal, rice, and beans. °· Fats such as olive oil, trans fat-free margarine, canola oil, avocado, and olives. °DOES EVERYONE WITH DIABETES MELLITUS HAVE THE SAME MEAL PLAN? °Because every person with diabetes mellitus is different, there is not one meal plan that works for everyone. It is very important that you meet with a dietitian who will help you create a meal plan that is just right for you. °Document Released: 02/10/2005 Document Revised: 05/21/2013 Document Reviewed: 04/12/2013 °[employer]® Patient Information ©2015 [employer]. This   information is not intended to replace advice given to you by your health care provider. Make sure you discuss any questions you have with your health care provider. ° °Hyperglycemia °Hyperglycemia occurs when the glucose (sugar) in your blood is too high. Hyperglycemia can happen for many reasons, but it most often happens to  people who do not know they have diabetes or are not managing their diabetes properly.  °CAUSES  °Whether you have diabetes or not, there are other causes of hyperglycemia. Hyperglycemia can occur when you have diabetes, but it can also occur in other situations that you might not be as aware of, such as: °Diabetes °· If you have diabetes and are having problems controlling your blood glucose, hyperglycemia could occur because of some of the following reasons: °¨ Not following your meal plan. °¨ Not taking your diabetes medications or not taking it properly. °¨ Exercising less or doing less activity than you normally do. °¨ Being sick. °Pre-diabetes °· This cannot be ignored. Before people develop Type 2 diabetes, they almost always have "pre-diabetes." This is when your blood glucose levels are higher than normal, but not yet high enough to be diagnosed as diabetes. Research has shown that some long-term damage to the body, especially the heart and circulatory system, may already be occurring during pre-diabetes. If you take action to manage your blood glucose when you have pre-diabetes, you may delay or prevent Type 2 diabetes from developing. °Stress °· If you have diabetes, you may be "diet" controlled or on oral medications or insulin to control your diabetes. However, you may find that your blood glucose is higher than usual in the hospital whether you have diabetes or not. This is often referred to as "stress hyperglycemia." Stress can elevate your blood glucose. This happens because of hormones put out by the body during times of stress. If stress has been the cause of your high blood glucose, it can be followed regularly by your caregiver. That way he/she can make sure your hyperglycemia does not continue to get worse or progress to diabetes. °Steroids °· Steroids are medications that act on the infection fighting system (immune system) to block inflammation or infection. One side effect can be a rise in  blood glucose. Most people can produce enough extra insulin to allow for this rise, but for those who cannot, steroids make blood glucose levels go even higher. It is not unusual for steroid treatments to "uncover" diabetes that is developing. It is not always possible to determine if the hyperglycemia will go away after the steroids are stopped. A special blood test called an A1c is sometimes done to determine if your blood glucose was elevated before the steroids were started. °SYMPTOMS °· Thirsty. °· Frequent urination. °· Dry mouth. °· Blurred vision. °· Tired or fatigue. °· Weakness. °· Sleepy. °· Tingling in feet or leg. °DIAGNOSIS  °Diagnosis is made by monitoring blood glucose in one or all of the following ways: °· A1c test. This is a chemical found in your blood. °· Fingerstick blood glucose monitoring. °· Laboratory results. °TREATMENT  °First, knowing the cause of the hyperglycemia is important before the hyperglycemia can be treated. Treatment may include, but is not be limited to: °· Education. °· Change or adjustment in medications. °· Change or adjustment in meal plan. °· Treatment for an illness, infection, etc. °· More frequent blood glucose monitoring. °· Change in exercise plan. °· Decreasing or stopping steroids. °· Lifestyle changes. °HOME CARE INSTRUCTIONS  °· Test your   blood glucose as directed. °· Exercise regularly. Your caregiver will give you instructions about exercise. Pre-diabetes or diabetes which comes on with stress is helped by exercising. °· Eat wholesome, balanced meals. Eat often and at regular, fixed times. Your caregiver or nutritionist will give you a meal plan to guide your sugar intake. °· Being at an ideal weight is important. If needed, losing as little as 10 to 15 pounds may help improve blood glucose levels. °SEEK MEDICAL CARE IF:  °· You have questions about medicine, activity, or diet. °· You continue to have symptoms (problems such as increased thirst, urination, or  weight gain). °SEEK IMMEDIATE MEDICAL CARE IF:  °· You are vomiting or have diarrhea. °· Your breath smells fruity. °· You are breathing faster or slower. °· You are very sleepy or incoherent. °· You have numbness, tingling, or pain in your feet or hands. °· You have chest pain. °· Your symptoms get worse even though you have been following your caregiver's orders. °· If you have any other questions or concerns. °Document Released: 11/09/2000 Document Revised: 08/08/2011 Document Reviewed: 09/12/2011 °ExitCare® Patient Information ©2015 [employer]. This information is not intended to replace advice given to you by your health care provider. Make sure you discuss any questions you have with your health care provider. ° °

## 2014-08-18 NOTE — Progress Notes (Signed)
Pre visit review using our clinic review tool, if applicable. No additional management support is needed unless otherwise documented below in the visit note. 

## 2014-08-19 ENCOUNTER — Telehealth: Payer: Self-pay | Admitting: Family Medicine

## 2014-08-19 NOTE — Telephone Encounter (Signed)
emmi emailed °

## 2014-09-30 ENCOUNTER — Encounter: Payer: Self-pay | Admitting: Family Medicine

## 2014-09-30 ENCOUNTER — Ambulatory Visit (INDEPENDENT_AMBULATORY_CARE_PROVIDER_SITE_OTHER): Payer: Medicare Other | Admitting: Family Medicine

## 2014-09-30 VITALS — BP 140/84 | HR 74 | Temp 97.6°F | Ht 69.0 in | Wt 252.4 lb

## 2014-09-30 DIAGNOSIS — R21 Rash and other nonspecific skin eruption: Secondary | ICD-10-CM | POA: Diagnosis not present

## 2014-09-30 MED ORDER — TRIAMCINOLONE ACETONIDE 0.1 % EX CREA: 1.0000 "application " | TOPICAL_CREAM | Freq: Two times a day (BID) | CUTANEOUS | Status: DC

## 2014-09-30 NOTE — Progress Notes (Signed)
HPI:  Rash: -started yesterday morning -thinks got bit yesterday morning - but did not see an insect -has been doing yard work but does not think was in poison ivy -two patches of itchy rash on R foreman -denies: pain, rash elsewhere, fevers, malaise, SOB -hx of bad reaction to poison ivy  ROS: See pertinent positives and negatives per HPI.  Past Medical History  Diagnosis Date  . Allergic rhinitis   . Hypertension   . Hyperlipidemia   . Overweight(278.02)   . GERD (gastroesophageal reflux disease)   . Barrett's esophagus   . Colitis, ischemic   . Benign prostatic hypertrophy   . DJD (degenerative joint disease)   . Neuropathy   . Anxiety     Past Surgical History  Procedure Laterality Date  . Laparoscopic cholecystectomy  1993    Dr. Luan Moore  . 2 neck surgeries  2005-2006    Dr. Georgia Dom cx decompression, diskectomy,fusion C6-7 allograft  . Right total knee replacement  06/2006    Dr. Despina Hick  . Left total knee replacement  07/2007    Dr. Despina Hick  . Appendectomy    . Foot surgery      right x 3  . Septoplasty  1975, May 2013    x3    Family History  Problem Relation Age of Onset  . Colon cancer Neg Hx   . Stomach cancer Neg Hx   . Rectal cancer Neg Hx   . Esophageal cancer Neg Hx     History   Social History  . Marital Status: Married    Spouse Name: N/A  . Number of Children: 1  . Years of Education: N/A   Social History Main Topics  . Smoking status: Never Smoker   . Smokeless tobacco: Never Used  . Alcohol Use: No  . Drug Use: No  . Sexual Activity: Not on file   Other Topics Concern  . None   Social History Narrative     Current outpatient prescriptions:  .  Acetaminophen (TYLENOL ARTHRITIS EXT RELIEF PO), Take 2 capsules by mouth 2 (two) times daily. , Disp: , Rfl:  .  aspirin 81 MG tablet, Take 81 mg by mouth daily.  , Disp: , Rfl:  .  azelastine (ASTELIN) 0.1 % nasal spray, Place 2 sprays into both nostrils 2 (two) times daily. Use  in each nostril as directed, Disp: , Rfl:  .  doxazosin (CARDURA) 8 MG tablet, TAKE ONE TABLET BY MOUTH AT BEDTIME, Disp: 30 tablet, Rfl: 11 .  EPIPEN 2-PAK 0.3 MG/0.3ML DEVI, As needed, Disp: , Rfl:  .  esomeprazole (NEXIUM) 40 MG capsule, Take 1 capsule (40 mg total) by mouth daily before breakfast., Disp: 30 capsule, Rfl: 11 .  fluticasone (VERAMYST) 27.5 MCG/SPRAY nasal spray, Place 2 sprays into the nose daily., Disp: , Rfl:  .  loratadine (CLARITIN) 10 MG tablet, Take 10 mg by mouth daily., Disp: , Rfl:  .  Lysine 500 MG CAPS, Take 1 capsule by mouth daily.  , Disp: , Rfl:  .  Multiple Vitamins-Minerals (CENTRUM) tablet, Take 1 tablet by mouth daily.  , Disp: , Rfl:  .  traMADol (ULTRAM) 50 MG tablet, TAKE 1 TABLET UP TO THREE TIMES A DAY AS NEEDED FOR PAIN, Disp: 90 tablet, Rfl: 5 .  UNABLE TO FIND, Med Name: allergy shots weekly, Disp: , Rfl:  .  triamcinolone cream (KENALOG) 0.1 %, Apply 1 application topically 2 (two) times daily., Disp: 30 g, Rfl: 0  EXAM:  Filed Vitals:   09/30/14 1347  BP: 140/84  Pulse: 74  Temp: 97.6 F (36.4 C)    Body mass index is 37.26 kg/(m^2).  GENERAL: vitals reviewed and listed above, alert, oriented, appears well hydrated and in no acute distress  HEENT: atraumatic, conjunttiva clear, no obvious abnormalities on inspection of external nose and ears  NECK: no obvious masses on inspection  SKIN: two separate streaky patches of erythematous papulovesicular lesions on R forearm with excoriation - one small patch near ant wrist and the other on the forearm, no induration or wounds seen  MS: moves all extremities without noticeable abnormality  PSYCH: pleasant and cooperative, no obvious depression or anxiety  ASSESSMENT AND PLAN:  Discussed the following assessment and plan:  Rash and nonspecific skin eruption - Plan: triamcinolone cream (KENALOG) 0.1 %  -he feels this is an insect bite as he thinks he felts something bite him - if this is  the case then he was bitten twice and is having a mild local allergic reaction, though I query toxicodendron dermatitis given appearance  -opted for topical steroid cream, antihistamine, return precautions -antibiotic ointment on larger lesion where he has scratched and there is small excoriation to prevent 2ndary infection, no signs of bacterial infection today -Patient advised to return or notify a doctor immediately if symptoms worsen or persist or new concerns arise.  Patient Instructions  Please use the antibiotic ointment twice daily   Please use the triamcinilone cream twice daily  Follow up immediately if worsening, pain, fevers or other concerns     KIM, HANNAH R.

## 2014-09-30 NOTE — Progress Notes (Signed)
Pre visit review using our clinic review tool, if applicable. No additional management support is needed unless otherwise documented below in the visit note. 

## 2014-09-30 NOTE — Patient Instructions (Signed)
Please use the antibiotic ointment twice daily   Please use the triamcinilone cream twice daily  Follow up immediately if worsening, pain, fevers or other concerns

## 2014-11-19 ENCOUNTER — Encounter: Payer: Self-pay | Admitting: Family Medicine

## 2014-11-19 ENCOUNTER — Ambulatory Visit (INDEPENDENT_AMBULATORY_CARE_PROVIDER_SITE_OTHER): Payer: Medicare Other | Admitting: Family Medicine

## 2014-11-19 VITALS — BP 134/80 | HR 64 | Temp 98.1°F | Wt 251.0 lb

## 2014-11-19 DIAGNOSIS — I10 Essential (primary) hypertension: Secondary | ICD-10-CM | POA: Diagnosis not present

## 2014-11-19 NOTE — Progress Notes (Signed)
Pre visit review using our clinic review tool, if applicable. No additional management support is needed unless otherwise documented below in the visit note. 

## 2014-11-19 NOTE — Progress Notes (Signed)
   Subjective:    Patient ID: Isaiah Wilson, male    DOB: [birth year], 80 y.o.   MRN: [id number]  HPI Medical follow-up. He has hypertension which is currently treated with Cardura. He has been on this for several years. Blood pressure been stable by home readings. No dizziness. History of prediabetes. Recent A1c 6.0%. He has not lost any weight since last visit. 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 No recent chest pains.  Past Medical History  Diagnosis Date  . Allergic rhinitis   . Hypertension   . Hyperlipidemia   . Overweight(278.02)   . GERD (gastroesophageal reflux disease)   . Barrett's esophagus   . Colitis, ischemic   . Benign prostatic hypertrophy   . DJD (degenerative joint disease)   . Neuropathy   . Anxiety    Past Surgical History  Procedure Laterality Date  . Laparoscopic cholecystectomy  1993    Dr. Luan Moore  . 2 neck surgeries  2005-2006    Dr. Georgia Dom cx decompression, diskectomy,fusion C6-7 allograft  . Right total knee replacement  06/2006    Dr. Despina Hick  . Left total knee replacement  07/2007    Dr. Despina Hick  . Appendectomy    . Foot surgery      right x 3  . Septoplasty  1975, May 2013    x3    reports that he has never smoked. He has never used smokeless tobacco. He reports that he does not drink alcohol or use illicit drugs. family history is negative for Colon cancer, Stomach cancer, Rectal cancer, and Esophageal cancer. Allergies  Allergen Reactions  . Tamsulosin     REACTION: pt states INTOL to Flomax - "it affects my eyes"      Review of Systems  Constitutional: Negative for fatigue.  Eyes: Negative for visual disturbance.  Respiratory: Negative for cough, chest tightness and shortness of breath.   Cardiovascular: Negative for chest pain, palpitations and leg swelling.  Neurological: Negative for dizziness, syncope, weakness, light-headedness and headaches.       Objective:   Physical Exam  Constitutional: He is  oriented to person, place, and time. He appears well-developed and well-nourished.  HENT:  Right Ear: External ear normal.  Left Ear: External ear normal.  Mouth/Throat: Oropharynx is clear and moist.  Eyes: Pupils are equal, round, and reactive to light.  Neck: Neck supple. No thyromegaly present.  Cardiovascular: Normal rate and regular rhythm.   Pulmonary/Chest: Effort normal and breath sounds normal. No respiratory distress. He has no wheezes. He has no rales.  Musculoskeletal: He exhibits no edema.  Neurological: He is alert and oriented to person, place, and time.          Assessment & Plan:  Hypertension. Stable by today's reading. We have again encouraged weight loss and keep close monitoring. Be in touch if blood pressure consistently greater than 150/90

## 2014-12-02 ENCOUNTER — Encounter: Payer: Self-pay | Admitting: Internal Medicine

## 2014-12-18 ENCOUNTER — Encounter: Payer: Self-pay | Admitting: Internal Medicine

## 2014-12-29 ENCOUNTER — Other Ambulatory Visit: Payer: Self-pay | Admitting: Family Medicine

## 2014-12-29 ENCOUNTER — Encounter: Payer: Self-pay | Admitting: Internal Medicine

## 2014-12-29 NOTE — Telephone Encounter (Signed)
Last visit 11/19/14 Last refill 06/25/14 #90 5 refill

## 2014-12-29 NOTE — Telephone Encounter (Signed)
Refill for 6 months. 

## 2015-01-20 ENCOUNTER — Encounter: Payer: Medicare Other | Admitting: Internal Medicine

## 2015-01-28 ENCOUNTER — Other Ambulatory Visit: Payer: Self-pay | Admitting: Family Medicine

## 2015-02-03 ENCOUNTER — Ambulatory Visit (INDEPENDENT_AMBULATORY_CARE_PROVIDER_SITE_OTHER): Payer: Medicare Other | Admitting: Family Medicine

## 2015-02-03 ENCOUNTER — Encounter: Payer: Self-pay | Admitting: Family Medicine

## 2015-02-03 VITALS — BP 130/90 | HR 58 | Temp 97.6°F | Wt 254.0 lb

## 2015-02-03 DIAGNOSIS — H1132 Conjunctival hemorrhage, left eye: Secondary | ICD-10-CM | POA: Diagnosis not present

## 2015-02-03 NOTE — Progress Notes (Signed)
   Subjective:    Patient ID: Isaiah Wilson, male    DOB: 1934-06-28, 80 y.o.   MRN: [id number]  HPI Acute visit for left eye redness. He noticed this yesterday morning when he woke up. Denies any injury. No change in vision. He has occasional matting of both eyes chronically but no sudden changes. He's noticed some mild itching recently which he thinks is related to working outdoors. He takes baby aspirin but no anticoagulants.  Past Medical History  Diagnosis Date  . Allergic rhinitis   . Hypertension   . Hyperlipidemia   . Overweight(278.02)   . GERD (gastroesophageal reflux disease)   . Barrett's esophagus   . Colitis, ischemic   . Benign prostatic hypertrophy   . DJD (degenerative joint disease)   . Neuropathy   . Anxiety    Past Surgical History  Procedure Laterality Date  . Laparoscopic cholecystectomy  1993    Dr. Luan Moore  . 2 neck surgeries  2005-2006    Dr. Georgia Dom cx decompression, diskectomy,fusion C6-7 allograft  . Right total knee replacement  06/2006    Dr. Despina Hick  . Left total knee replacement  07/2007    Dr. Despina Hick  . Appendectomy    . Foot surgery      right x 3  . Septoplasty  1975, May 2013    x3    reports that he has never smoked. He has never used smokeless tobacco. He reports that he does not drink alcohol or use illicit drugs. family history is negative for Colon cancer, Stomach cancer, Rectal cancer, and Esophageal cancer. Allergies  Allergen Reactions  . Tamsulosin     REACTION: pt states INTOL to Flomax - "it affects my eyes"      Review of Systems  Eyes: Positive for redness. Negative for photophobia, pain and visual disturbance.  Neurological: Negative for headaches.       Objective:   Physical Exam  Constitutional: He appears well-developed and well-nourished.  HENT:  Head: Normocephalic and atraumatic.  Eyes:  Right normal. He has left subconjunctival hemorrhage. No evidence for conjunctivitis. Extraocular movements  normal. Cornea appears normal  Cardiovascular: Normal rate and regular rhythm.   Pulmonary/Chest: Effort normal and breath sounds normal. No respiratory distress. He has no wheezes. He has no rales.          Assessment & Plan:  Left subconjunctival hemorrhage. Benign exam otherwise. Reassurance given.

## 2015-02-03 NOTE — Patient Instructions (Signed)
Subconjunctival Hemorrhage °A subconjunctival hemorrhage is a bright red patch covering a portion of the white of the eye. The white part of the eye is called the sclera, and it is covered by a thin membrane called the conjunctiva. This membrane is clear, except for tiny blood vessels that you can see with the naked eye. When your eye is irritated or inflamed and becomes red, it is because the vessels in the conjunctiva are swollen. °Sometimes, a blood vessel in the conjunctiva can break and bleed. When this occurs, the blood builds up between the conjunctiva and the sclera, and spreads out to create a red area. The red spot may be very small at first. It may then spread to cover a larger part of the surface of the eye, or even all of the visible white part of the eye. °In almost all cases, the blood will go away and the eye will become white again. Before completely dissolving, however, the red area may spread. It may also become brownish-yellow in color before going away. If a lot of blood collects under the conjunctiva, it may look like a bulge on the surface of the eye. This looks scary, but it will also eventually flatten out and go away. Subconjunctival hemorrhages do not cause pain, but if swollen, may cause a feeling of irritation. There is no effect on vision.  °CAUSES  °· The most common cause is mild trauma (rubbing the eye, irritation). °· Subconjunctival hemorrhages can happen because of coughing or straining (lifting heavy objects), vomiting, or sneezing. °· In some cases, your doctor may want to check your blood pressure. High blood pressure can also cause a subconjunctival hemorrhage. °· Severe trauma or blunt injuries. °· Diseases that affect blood clotting (hemophilia, leukemia). °· Abnormalities of blood vessels behind the eye (carotid cavernous sinus fistula). °· Tumors behind the eye. °· Certain drugs (aspirin, Coumadin, heparin). °· Recent eye surgery. °HOME CARE INSTRUCTIONS  °· Do not worry  about the appearance of your eye. You may continue your usual activities. °· Often, follow-up is not necessary. °SEEK MEDICAL CARE IF:  °· Your eye becomes painful. °· The bleeding does not disappear within 3 weeks. °· Bleeding occurs elsewhere, for example, under the skin, in the mouth, or in the other eye. °· You have recurring subconjunctival hemorrhages. °SEEK IMMEDIATE MEDICAL CARE IF:  °· Your vision changes or you have difficulty seeing. °· You develop a severe headache, persistent vomiting, confusion, or abnormal drowsiness (lethargy). °· Your eye seems to bulge or protrude from the eye socket. °· You notice the sudden appearance of bruises or have spontaneous bleeding elsewhere on your body. °Document Released: 05/16/2005 Document Revised: 09/30/2013 Document Reviewed: 04/13/2009 °ExitCare® Patient Information ©2015 [employer]. This information is not intended to replace advice given to you by your health care provider. Make sure you discuss any questions you have with your health care provider. ° °

## 2015-02-03 NOTE — Progress Notes (Signed)
Pre visit review using our clinic review tool, if applicable. No additional management support is needed unless otherwise documented below in the visit note. 

## 2015-02-17 ENCOUNTER — Ambulatory Visit (AMBULATORY_SURGERY_CENTER): Payer: Self-pay

## 2015-02-17 VITALS — Ht 71.0 in | Wt 255.0 lb

## 2015-02-17 DIAGNOSIS — K2270 Barrett's esophagus without dysplasia: Secondary | ICD-10-CM

## 2015-02-17 NOTE — Progress Notes (Signed)
No egg or soy allergy.  No previous complications from anesthesia. No home O2. No diet meds. 

## 2015-03-03 ENCOUNTER — Encounter: Payer: Self-pay | Admitting: Internal Medicine

## 2015-03-03 ENCOUNTER — Ambulatory Visit (AMBULATORY_SURGERY_CENTER): Payer: Medicare Other | Admitting: Internal Medicine

## 2015-03-03 VITALS — BP 153/90 | HR 53 | Temp 97.9°F | Resp 16 | Ht 71.0 in | Wt 255.0 lb

## 2015-03-03 DIAGNOSIS — K2270 Barrett's esophagus without dysplasia: Secondary | ICD-10-CM

## 2015-03-03 MED ORDER — SODIUM CHLORIDE 0.9 % IV SOLN: 500.0000 mL | INTRAVENOUS | Status: DC

## 2015-03-03 NOTE — Progress Notes (Signed)
Called to room to assist during endoscopic procedure.  Patient ID and intended procedure confirmed with present staff. Received instructions for my participation in the procedure from the performing physician.  

## 2015-03-03 NOTE — Progress Notes (Signed)
Transferred to recovery room. A/O x3, pleased with MAC.  VSS.  Report to Jane, RN. 

## 2015-03-03 NOTE — Op Note (Signed)
[facility] [address] [place], [address]   ENDOSCOPY PROCEDURE REPORT  PATIENT: Isaiah, Wilson  MR#: [id number] BIRTHDATE: 25-Aug-1934 , 79  yrs. old GENDER: male ENDOSCOPIST: Roxy Cedar, MD REFERRED BY:  Surveillance Program Recall PROCEDURE DATE:  03/03/2015 PROCEDURE:  EGD w/ biopsy ASA CLASS:     Class II INDICATIONS:  follow up of Barrett's esophagus. . Last upper endoscopy with Dr. Jarold Motto in August 2013 (nondysplastic Barrett's esophagus) MEDICATIONS: Monitored anesthesia care and Propofol 300 mg IV TOPICAL ANESTHETIC: none  DESCRIPTION OF PROCEDURE: After the risks benefits and alternatives of the procedure were thoroughly explained, informed consent was obtained.  The LB [id number] endoscope was introduced through the mouth and advanced to the second portion of the duodenum , Without limitations.  The instrument was slowly withdrawn as the mucosa was fully examined.  EXAM:The esophagus revealed irregular Barrett's mucosa distalmost portion( C1,M3) without inflammation or nodularity.  This was examined under white light, NBI, and high definition.  Multiple biopsies taken.  Stomach revealed benign fundic gland polyps but was otherwise normal.  The duodenum was normal.  Retroflexed views revealed a hiatal hernia.     The scope was then withdrawn from the patient and the procedure completed.  COMPLICATIONS: There were no immediate complications.  ENDOSCOPIC IMPRESSION: 1. Barrett's esophagus status post biopsies 2. GERD  RECOMMENDATIONS: 1.  Continue Nexium daily 2.  REPEAT SURVEILLANCE EGD IN 3 YEARS, no dysplasia on biopsies. Dr. Marina Goodell will send you a letter with the results  REPEAT EXAM:  eSigned:  Roxy Cedar, MD 03/03/2015 4:05 PM    CC:The Patient and Evelena Peat, MD

## 2015-03-03 NOTE — Patient Instructions (Addendum)
YOU HAD AN ENDOSCOPIC PROCEDURE TODAY AT THE [facility]:   Refer to the procedure report that was given to you for any specific questions about what was found during the examination.  If the procedure report does not answer your questions, please call your gastroenterologist to clarify.  If you requested that your care partner not be given the details of your procedure findings, then the procedure report has been included in a sealed envelope for you to review at your convenience later.  YOU SHOULD EXPECT: Some feelings of bloating in the abdomen. Passage of more gas than usual.  Walking can help get rid of the air that was put into your GI tract during the procedure and reduce the bloating. If you had a lower endoscopy (such as a colonoscopy or flexible sigmoidoscopy) you may notice spotting of blood in your stool or on the toilet paper. If you underwent a bowel prep for your procedure, you may not have a normal bowel movement for a few days.  Please Note:  You might notice some irritation and congestion in your nose or some drainage.  This is from the oxygen used during your procedure.  There is no need for concern and it should clear up in a day or so.  SYMPTOMS TO REPORT IMMEDIATELY:    Following upper endoscopy (EGD)  Vomiting of blood or coffee ground material  New chest pain or pain under the shoulder blades  Painful or persistently difficult swallowing  New shortness of breath  Fever of 100F or higher  Black, tarry-looking stools  For urgent or emergent issues, a gastroenterologist can be reached at any hour by calling [phone].   DIET: Your first meal following the procedure should be a small meal and then it is ok to progress to your normal diet. Heavy or fried foods are harder to digest and may make you feel nauseous or bloated.  Likewise, meals heavy in dairy and vegetables can increase bloating.  Drink plenty of fluids but you should avoid alcoholic beverages  for 24 hours.  ACTIVITY:  You should plan to take it easy for the rest of today and you should NOT DRIVE or use heavy machinery until tomorrow (because of the sedation medicines used during the test).    FOLLOW UP: Our staff will call the number listed on your records the next business day following your procedure to check on you and address any questions or concerns that you may have regarding the information given to you following your procedure. If we do not reach you, we will leave a message.  However, if you are feeling well and you are not experiencing any problems, there is no need to return our call.  We will assume that you have returned to your regular daily activities without incident.  If any biopsies were taken you will be contacted by phone or by letter within the next 1-3 weeks.  Please call us at [phone] if you have not heard about the biopsies in 3 weeks.    SIGNATURES/CONFIDENTIALITY: You and/or your care partner have signed paperwork which will be entered into your electronic medical record.  These signatures attest to the fact that that the information above on your After Visit Summary has been reviewed and is understood.  Full responsibility of the confidentiality of this discharge information lies with you and/or your care-partner.  Barrett's esophagus information given. Continue nexium.  GERD information given

## 2015-03-04 ENCOUNTER — Telehealth: Payer: Self-pay | Admitting: *Deleted

## 2015-03-04 NOTE — Telephone Encounter (Signed)
  Follow up Call-  Call back number 03/03/2015  Post procedure Call Back phone  # [phone]  Permission to leave phone message Yes     Patient questions:  Do you have a fever, pain , or abdominal swelling? No. Pain Score  0 *  Have you tolerated food without any problems? Yes.    Have you been able to return to your normal activities? Yes.    Do you have any questions about your discharge instructions: Diet   No. Medications  No. Follow up visit  No.  Do you have questions or concerns about your Care? No.  Actions: * If pain score is 4 or above: No action needed, pain <4.

## 2015-03-09 ENCOUNTER — Encounter: Payer: Self-pay | Admitting: Internal Medicine

## 2015-03-27 ENCOUNTER — Encounter: Payer: Self-pay | Admitting: Adult Health

## 2015-03-27 ENCOUNTER — Ambulatory Visit (INDEPENDENT_AMBULATORY_CARE_PROVIDER_SITE_OTHER): Payer: Medicare Other | Admitting: Adult Health

## 2015-03-27 VITALS — BP 160/78 | HR 75 | Temp 98.7°F | Ht 71.0 in | Wt 253.2 lb

## 2015-03-27 DIAGNOSIS — J209 Acute bronchitis, unspecified: Secondary | ICD-10-CM | POA: Diagnosis not present

## 2015-03-27 MED ORDER — HYDROCODONE-HOMATROPINE 5-1.5 MG/5ML PO SYRP: 5.0000 mL | ORAL_SOLUTION | Freq: Three times a day (TID) | ORAL | Status: DC | PRN

## 2015-03-27 MED ORDER — ALBUTEROL SULFATE 108 (90 BASE) MCG/ACT IN AEPB: 1.0000 | INHALATION_SPRAY | Freq: Four times a day (QID) | RESPIRATORY_TRACT | Status: DC | PRN

## 2015-03-27 MED ORDER — METHYLPREDNISOLONE 4 MG PO TBPK: ORAL_TABLET | ORAL | Status: DC

## 2015-03-27 NOTE — Progress Notes (Signed)
Subjective:    Patient ID: Isaiah Wilson, male    DOB: 06/02/1934, 80 y.o.   MRN: [id number]  HPI  80 year old male who presents to the office today for cough x 3 weeks. He believes that it started after he was doing yard word.  The cough is dry. Last night he had a coughing attack that "made me choke." He does have allergies and gets allergy shots twice a week. Endorses having this cough once a year  Denies fevers, nausea, vomiting, diarrhea.   He has been using OTC cough medication that does not work.   Review of Systems  Constitutional: Negative.   HENT: Negative.   Respiratory: Positive for cough. Negative for apnea, chest tightness, shortness of breath, wheezing and stridor.   Cardiovascular: Negative.   Neurological: Negative.   All other systems reviewed and are negative.  Past Medical History  Diagnosis Date  . Allergic rhinitis   . Hypertension   . Hyperlipidemia   . Overweight(278.02)   . GERD (gastroesophageal reflux disease)   . Barrett's esophagus   . Colitis, ischemic (HCC)   . Benign prostatic hypertrophy   . DJD (degenerative joint disease)   . Neuropathy (HCC)   . Anxiety   . Allergy   . Sleep apnea     Social History   Social History  . Marital Status: Married    Spouse Name: N/A  . Number of Children: 1  . Years of Education: N/A   Occupational History  . Not on file.   Social History Main Topics  . Smoking status: Never Smoker   . Smokeless tobacco: Never Used  . Alcohol Use: No  . Drug Use: No  . Sexual Activity: Not on file   Other Topics Concern  . Not on file   Social History Narrative    Past Surgical History  Procedure Laterality Date  . Laparoscopic cholecystectomy  1993    Dr. Luan MooreP. Young  . 2 neck surgeries  2005-2006    Dr. Georgia DomHirsh--ant cx decompression, diskectomy,fusion C6-7 allograft  . Right total knee replacement  06/2006    Dr. Despina HickAlusio  . Left total knee replacement  07/2007    Dr. Despina HickAlusio  . Appendectomy    .  Foot surgery      right x 3  . Septoplasty  1975, May 2013    x3    Family History  Problem Relation Age of Onset  . Colon cancer Neg Hx   . Stomach cancer Neg Hx   . Rectal cancer Neg Hx   . Esophageal cancer Neg Hx     Allergies  Allergen Reactions  . Tamsulosin     REACTION: pt states INTOL to Flomax - "it affects my eyes"    Current Outpatient Prescriptions on File Prior to Visit  Medication Sig Dispense Refill  . Acetaminophen (TYLENOL ARTHRITIS EXT RELIEF PO) Take 2 capsules by mouth 2 (two) times daily.     Marland Kitchen. aspirin 81 MG tablet Take 81 mg by mouth daily.      Marland Kitchen. azelastine (ASTELIN) 0.1 % nasal spray Place 2 sprays into both nostrils 2 (two) times daily. Use in each nostril as directed    . doxazosin (CARDURA) 8 MG tablet TAKE ONE TABLET BY MOUTH AT BEDTIME 30 tablet 11  . EPIPEN 2-PAK 0.3 MG/0.3ML DEVI As needed    . esomeprazole (NEXIUM) 40 MG capsule Take 1 capsule (40 mg total) by mouth daily before breakfast. 30 capsule 11  .  fluticasone (VERAMYST) 27.5 MCG/SPRAY nasal spray Place 2 sprays into the nose daily.    Marland Kitchen loratadine (CLARITIN) 10 MG tablet Take 10 mg by mouth daily.    Marland Kitchen Lysine 500 MG CAPS Take 1 capsule by mouth daily.      . Multiple Vitamins-Minerals (CENTRUM) tablet Take 1 tablet by mouth daily.      . traMADol (ULTRAM) 50 MG tablet TAKE 1 TABLET BY MOUTH THREE TIMES DAILY AS NEEDED 90 tablet 5  . UNABLE TO FIND Med Name: allergy shots weekly     No current facility-administered medications on file prior to visit.    BP 160/78 mmHg  Pulse 75  Temp(Src) 98.7 F (37.1 C) (Oral)  Ht  (1.803 m)  Wt 253 lb 3.2 oz (114.851 kg)  BMI 35.33 kg/m2  SpO2 97%       Objective:   Physical Exam  Constitutional: He is oriented to person, place, and time. He appears well-developed and well-nourished. No distress.  Cardiovascular: Normal rate, regular rhythm, normal heart sounds and intact distal pulses.  Exam reveals no gallop and no friction rub.    No murmur heard. Pulmonary/Chest: Effort normal. No respiratory distress. He has wheezes (full fields). He has no rales. He exhibits no tenderness.  Musculoskeletal: Normal range of motion.  Lymphadenopathy:    He has no cervical adenopathy.  Neurological: He is alert and oriented to person, place, and time.  Skin: Skin is warm and dry. No rash noted. He is not diaphoretic. No erythema. No pallor.  Psychiatric: He has a normal mood and affect. His behavior is normal. Judgment and thought content normal.  Nursing note and vitals reviewed.      Assessment & Plan:  1. Acute bronchitis, unspecified organism - methylPREDNISolone (MEDROL DOSEPAK) 4 MG TBPK tablet; Take as directed  Dispense: 21 tablet; Refill: 0 - HYDROcodone-homatropine (HYCODAN) 5-1.5 MG/5ML syrup; Take 5 mLs by mouth every 8 (eight) hours as needed for cough.  Dispense: 120 mL; Refill: 0 - Albuterol Sulfate (PROAIR RESPICLICK) 108 (90 BASE) MCG/ACT AEPB; Inhale 1 puff into the lungs every 6 (six) hours as needed.  Dispense: 1 each; Refill: 2 - Follow up if no improvement in 2-3 days.

## 2015-03-27 NOTE — Patient Instructions (Addendum)
It was a pleasure meeting you today!  I have sent in a prescription for Medrol Dose Pack. This is a steroid which will help with inflammation.   Use the inhaler as needed.   Use the cough syrup at night as it can make you sleepy.   Stop using the over the counter medications.   Follow up if no improvement in the next 2-3 days.   Acute Bronchitis Bronchitis is inflammation of the airways that extend from the windpipe into the lungs (bronchi). The inflammation often causes mucus to develop. This leads to a cough, which is the most common symptom of bronchitis.  In acute bronchitis, the condition usually develops suddenly and goes away over time, usually in a couple weeks. Smoking, allergies, and asthma can make bronchitis worse. Repeated episodes of bronchitis may cause further lung problems.  CAUSES Acute bronchitis is most often caused by the same virus that causes a cold. The virus can spread from person to person (contagious) through coughing, sneezing, and touching contaminated objects. SIGNS AND SYMPTOMS   Cough.   Fever.   Coughing up mucus.   Body aches.   Chest congestion.   Chills.   Shortness of breath.   Sore throat.  DIAGNOSIS  Acute bronchitis is usually diagnosed through a physical exam. Your health care provider will also ask you questions about your medical history. Tests, such as chest X-rays, are sometimes done to rule out other conditions.  TREATMENT  Acute bronchitis usually goes away in a couple weeks. Oftentimes, no medical treatment is necessary. Medicines are sometimes given for relief of fever or cough. Antibiotic medicines are usually not needed but may be prescribed in certain situations. In some cases, an inhaler may be recommended to help reduce shortness of breath and control the cough. A cool mist vaporizer may also be used to help thin bronchial secretions and make it easier to clear the chest.  HOME CARE INSTRUCTIONS  Get plenty of rest.    Drink enough fluids to keep your urine clear or pale yellow (unless you have a medical condition that requires fluid restriction). Increasing fluids may help thin your respiratory secretions (sputum) and reduce chest congestion, and it will prevent dehydration.   Take medicines only as directed by your health care provider.  If you were prescribed an antibiotic medicine, finish it all even if you start to feel better.  Avoid smoking and secondhand smoke. Exposure to cigarette smoke or irritating chemicals will make bronchitis worse. If you are a smoker, consider using nicotine gum or skin patches to help control withdrawal symptoms. Quitting smoking will help your lungs heal faster.   Reduce the chances of another bout of acute bronchitis by washing your hands frequently, avoiding people with cold symptoms, and trying not to touch your hands to your mouth, nose, or eyes.   Keep all follow-up visits as directed by your health care provider.  SEEK MEDICAL CARE IF: Your symptoms do not improve after 1 week of treatment.  SEEK IMMEDIATE MEDICAL CARE IF:  You develop an increased fever or chills.   You have chest pain.   You have severe shortness of breath.  You have bloody sputum.   You develop dehydration.  You faint or repeatedly feel like you are going to pass out.  You develop repeated vomiting.  You develop a severe headache. MAKE SURE YOU:   Understand these instructions.  Will watch your condition.  Will get help right away if you are not doing well  or get worse.   This information is not intended to replace advice given to you by your health care provider. Make sure you discuss any questions you have with your health care provider.   Document Released: 06/23/2004 Document Revised: 06/06/2014 Document Reviewed: 11/06/2012 Elsevier Interactive Patient Education [employer].

## 2015-03-27 NOTE — Progress Notes (Signed)
Pre visit review using our clinic review tool, if applicable. No additional management support is needed unless otherwise documented below in the visit note. 

## 2015-04-05 ENCOUNTER — Other Ambulatory Visit: Payer: Self-pay | Admitting: Family Medicine

## 2015-04-06 NOTE — Telephone Encounter (Signed)
Let's try Tessalon Perles 200 mg po TID prn cough.  Disp #30.  Follow up if cough not better in 2 weeks.

## 2015-04-07 ENCOUNTER — Other Ambulatory Visit: Payer: Self-pay | Admitting: *Deleted

## 2015-04-07 ENCOUNTER — Telehealth: Payer: Self-pay | Admitting: Family Medicine

## 2015-04-07 MED ORDER — BENZONATATE 200 MG PO CAPS: 200.0000 mg | ORAL_CAPSULE | Freq: Three times a day (TID) | ORAL | Status: DC | PRN

## 2015-04-07 NOTE — Telephone Encounter (Signed)
Please advise. I don't see the medication in the current medication list

## 2015-04-07 NOTE — Telephone Encounter (Signed)
Patient requests refill of hydromet.  Per Dr Caryl NeverBurchette patient should try Tessalon and if no improvement in 2 weeks he should schedule a follow up appointment.

## 2015-04-07 NOTE — Telephone Encounter (Signed)
Pt saw Kandee KeenCory last week and want to know if he would refill the med hycodan/hydromet  [facility] [place]

## 2015-04-08 MED ORDER — HYDROCODONE-HOMATROPINE 5-1.5 MG/5ML PO SYRP: 5.0000 mL | ORAL_SOLUTION | Freq: Three times a day (TID) | ORAL | Status: DC | PRN

## 2015-04-08 NOTE — Telephone Encounter (Signed)
Rx is ready for pick up and pt is aware  

## 2015-04-08 NOTE — Telephone Encounter (Signed)
He can have one additional refill

## 2015-04-09 ENCOUNTER — Ambulatory Visit (INDEPENDENT_AMBULATORY_CARE_PROVIDER_SITE_OTHER): Payer: Medicare Other

## 2015-04-09 DIAGNOSIS — Z23 Encounter for immunization: Secondary | ICD-10-CM

## 2015-05-21 ENCOUNTER — Encounter: Payer: Self-pay | Admitting: Family Medicine

## 2015-05-21 ENCOUNTER — Ambulatory Visit (INDEPENDENT_AMBULATORY_CARE_PROVIDER_SITE_OTHER): Payer: Medicare Other | Admitting: Family Medicine

## 2015-05-21 VITALS — BP 140/80 | HR 74 | Temp 98.1°F | Resp 16 | Ht 71.0 in | Wt 252.2 lb

## 2015-05-21 DIAGNOSIS — K219 Gastro-esophageal reflux disease without esophagitis: Secondary | ICD-10-CM | POA: Diagnosis not present

## 2015-05-21 DIAGNOSIS — R5383 Other fatigue: Secondary | ICD-10-CM

## 2015-05-21 DIAGNOSIS — N400 Enlarged prostate without lower urinary tract symptoms: Secondary | ICD-10-CM

## 2015-05-21 DIAGNOSIS — M1990 Unspecified osteoarthritis, unspecified site: Secondary | ICD-10-CM | POA: Diagnosis not present

## 2015-05-21 LAB — CBC WITH DIFFERENTIAL/PLATELET
BASOS PCT: 0.5 % (ref 0.0–3.0)
Basophils Absolute: 0 10*3/uL (ref 0.0–0.1)
EOS PCT: 2.8 % (ref 0.0–5.0)
Eosinophils Absolute: 0.2 10*3/uL (ref 0.0–0.7)
HCT: 43.1 % (ref 39.0–52.0)
Hemoglobin: 14.4 g/dL (ref 13.0–17.0)
LYMPHS ABS: 2.9 10*3/uL (ref 0.7–4.0)
Lymphocytes Relative: 36.4 % (ref 12.0–46.0)
MCHC: 33.3 g/dL (ref 30.0–36.0)
MCV: 88.8 fl (ref 78.0–100.0)
MONO ABS: 0.7 10*3/uL (ref 0.1–1.0)
Monocytes Relative: 8.8 % (ref 3.0–12.0)
NEUTROS ABS: 4 10*3/uL (ref 1.4–7.7)
NEUTROS PCT: 51.5 % (ref 43.0–77.0)
Platelets: 195 10*3/uL (ref 150.0–400.0)
RBC: 4.86 Mil/uL (ref 4.22–5.81)
RDW: 14.4 % (ref 11.5–15.5)
WBC: 7.9 10*3/uL (ref 4.0–10.5)

## 2015-05-21 LAB — VITAMIN B12: Vitamin B-12: 616 pg/mL (ref 211–911)

## 2015-05-21 NOTE — Progress Notes (Signed)
   Subjective:    Patient ID: Isaiah Wilson, male    DOB: 01/18/1935, 80 y.o.   MRN: [id number]  HPI  Patient seen for medical follow-up  Osteoarthritis. He's had previous left knee replacement is having ongoing issues with schedule follow-up with orthopedist soon. He takes tramadol for pain relief usually only 2 a day  Patient saw history of GERD and Barrett's esophagus. No recent dysphagia. Chronic Nexium use. He does complain of some fatigue issues. No recent neuropathy symptoms but does have past history of reported neuropathy symptoms. No record of B12 levels  Concerned because of several family members with "leukemia" history area patient requesting CBC. He denies any appetite or weight changes.  BPH symptomatically stable on Cardura. Minimal nocturia  Past Medical History  Diagnosis Date  . Allergic rhinitis   . Hypertension   . Hyperlipidemia   . Overweight(278.02)   . GERD (gastroesophageal reflux disease)   . Barrett's esophagus   . Colitis, ischemic (HCC)   . Benign prostatic hypertrophy   . DJD (degenerative joint disease)   . Neuropathy (HCC)   . Anxiety   . Allergy   . Sleep apnea    Past Surgical History  Procedure Laterality Date  . Laparoscopic cholecystectomy  1993    Dr. Luan MooreP. Young  . 2 neck surgeries  2005-2006    Dr. Georgia DomHirsh--ant cx decompression, diskectomy,fusion C6-7 allograft  . Right total knee replacement  06/2006    Dr. Despina HickAlusio  . Left total knee replacement  07/2007    Dr. Despina HickAlusio  . Appendectomy    . Foot surgery      right x 3  . Septoplasty  1975, May 2013    x3    reports that he has never smoked. He has never used smokeless tobacco. He reports that he does not drink alcohol or use illicit drugs. family history is negative for Colon cancer, Stomach cancer, Rectal cancer, and Esophageal cancer. Allergies  Allergen Reactions  . Tamsulosin     REACTION: pt states INTOL to Flomax - "it affects my eyes"      Review of Systems    Constitutional: Positive for fatigue. Negative for fever, chills, appetite change and unexpected weight change.  Eyes: Negative for visual disturbance.  Respiratory: Negative for cough, chest tightness and shortness of breath.   Cardiovascular: Negative for chest pain, palpitations and leg swelling.  Endocrine: Negative for polydipsia and polyuria.  Musculoskeletal: Positive for arthralgias.  Neurological: Negative for dizziness, syncope, weakness, light-headedness and headaches.       Objective:   Physical Exam  Constitutional: He is oriented to person, place, and time. He appears well-developed and well-nourished. No distress.  Neck: Neck supple. No JVD present. No thyromegaly present.  Cardiovascular: Normal rate and regular rhythm.   Pulmonary/Chest: Effort normal and breath sounds normal. No respiratory distress. He has no wheezes. He has no rales.  Musculoskeletal: He exhibits no edema.  Neurological: He is alert and oriented to person, place, and time.          Assessment & Plan:  #1 osteoarthritis involving multiple joints especially the knees. Continue tramadol as needed #2 BPH. Symptomatically stable. Continue doxazosin #3 fatigue. Chronic PPI use. Check B12 level and CBC

## 2015-05-21 NOTE — Progress Notes (Signed)
Pre visit review using our clinic review tool, if applicable. No additional management support is needed unless otherwise documented below in the visit note. 

## 2015-06-15 ENCOUNTER — Other Ambulatory Visit (HOSPITAL_COMMUNITY): Payer: Self-pay | Admitting: Orthopedic Surgery

## 2015-06-15 DIAGNOSIS — Z96659 Presence of unspecified artificial knee joint: Principal | ICD-10-CM

## 2015-06-15 DIAGNOSIS — Z471 Aftercare following joint replacement surgery: Secondary | ICD-10-CM

## 2015-06-25 ENCOUNTER — Encounter (HOSPITAL_COMMUNITY)
Admission: RE | Admit: 2015-06-25 | Discharge: 2015-06-25 | Disposition: A | Discharge status: Home / Self Care | Payer: Medicare Other | Source: Ambulatory Visit | Attending: Orthopedic Surgery | Admitting: Orthopedic Surgery

## 2015-06-25 DIAGNOSIS — Z96659 Presence of unspecified artificial knee joint: Secondary | ICD-10-CM | POA: Insufficient documentation

## 2015-06-25 DIAGNOSIS — Z471 Aftercare following joint replacement surgery: Secondary | ICD-10-CM

## 2015-06-25 MED ORDER — TECHNETIUM TC 99M MEDRONATE IV KIT
27.2000 | PACK | Freq: Once | INTRAVENOUS | Status: AC | PRN
  Administered 2015-06-25: 27.2 via INTRAVENOUS

## 2015-07-15 DIAGNOSIS — H6993 Unspecified Eustachian tube disorder, bilateral: Secondary | ICD-10-CM | POA: Insufficient documentation

## 2015-07-15 DIAGNOSIS — H6983 Other specified disorders of Eustachian tube, bilateral: Secondary | ICD-10-CM | POA: Insufficient documentation

## 2015-07-15 DIAGNOSIS — H6641 Suppurative otitis media, unspecified, right ear: Secondary | ICD-10-CM | POA: Insufficient documentation

## 2015-08-03 ENCOUNTER — Other Ambulatory Visit: Payer: Self-pay | Admitting: *Deleted

## 2015-08-03 MED ORDER — ESOMEPRAZOLE MAGNESIUM 40 MG PO CPDR: 40.0000 mg | DELAYED_RELEASE_CAPSULE | Freq: Every day | ORAL | Status: DC

## 2015-08-07 ENCOUNTER — Other Ambulatory Visit: Payer: Self-pay | Admitting: Family Medicine

## 2015-08-13 ENCOUNTER — Other Ambulatory Visit: Payer: Self-pay | Admitting: Family Medicine

## 2015-08-13 NOTE — Telephone Encounter (Signed)
Last seen on 05/21/2015 Last refill 12/30/2014 #90, 5rf Please advise

## 2015-08-13 NOTE — Telephone Encounter (Signed)
Refill for 6 months. 

## 2015-09-02 ENCOUNTER — Ambulatory Visit (INDEPENDENT_AMBULATORY_CARE_PROVIDER_SITE_OTHER): Payer: Medicare Other | Admitting: Family Medicine

## 2015-09-02 VITALS — BP 140/82 | HR 67 | Temp 98.0°F

## 2015-09-02 DIAGNOSIS — K219 Gastro-esophageal reflux disease without esophagitis: Secondary | ICD-10-CM

## 2015-09-02 DIAGNOSIS — Z01818 Encounter for other preprocedural examination: Secondary | ICD-10-CM

## 2015-09-02 NOTE — Progress Notes (Signed)
   Subjective:    Patient ID: Isaiah Wilson, male    DOB: 12/26/1934, 81 y.o.   MRN: [id number]  HPI  Patient here for presurgical clearance. He is looking at revision of prior left knee replacement. Never smoked. No history of CAD or COPD. No peripheral vascular disease. Chronic problems include history of obesity, osteoarthritis, mild hyperlipidemia, GERD, BPH. Denies any recent dyspnea or chest pains.   Patient relates he had stress test several years ago which was unremarkable. Father had coronary disease in his 4050s.   GERD symptoms are well controlled on Nexium. No dysphagia. No recent abdominal pains.  Past Medical History  Diagnosis Date  . Allergic rhinitis   . Hypertension   . Hyperlipidemia   . Overweight(278.02)   . GERD (gastroesophageal reflux disease)   . Barrett's esophagus   . Colitis, ischemic (HCC)   . Benign prostatic hypertrophy   . DJD (degenerative joint disease)   . Neuropathy (HCC)   . Anxiety   . Allergy   . Sleep apnea    Past Surgical History  Procedure Laterality Date  . Laparoscopic cholecystectomy  1993    Dr. Luan MooreP. Young  . 2 neck surgeries  2005-2006    Dr. Georgia DomHirsh--ant cx decompression, diskectomy,fusion C6-7 allograft  . Right total knee replacement  06/2006    Dr. Despina HickAlusio  . Left total knee replacement  07/2007    Dr. Despina HickAlusio  . Appendectomy    . Foot surgery      right x 3  . Septoplasty  1975, May 2013    x3    reports that he has never smoked. He has never used smokeless tobacco. He reports that he does not drink alcohol or use illicit drugs. family history is negative for Colon cancer, Stomach cancer, Rectal cancer, and Esophageal cancer. Allergies  Allergen Reactions  . Tamsulosin     REACTION: pt states INTOL to Flomax - "it affects my eyes"      Review of Systems  Constitutional: Negative for fever, chills and fatigue.  Eyes: Negative for visual disturbance.  Respiratory: Negative for cough, chest tightness, shortness of  breath and wheezing.   Cardiovascular: Negative for chest pain, palpitations and leg swelling.  Gastrointestinal: Negative for abdominal pain.  Neurological: Negative for dizziness, syncope, weakness, light-headedness and headaches.       Objective:   Physical Exam  Constitutional: He appears well-developed and well-nourished. No distress.  HENT:  Mouth/Throat: Oropharynx is clear and moist.  Neck: Neck supple. No thyromegaly present.  No carotid bruits  Cardiovascular: Normal rate and regular rhythm.   Pulmonary/Chest: Effort normal and breath sounds normal. No respiratory distress. He has no wheezes. He has no rales.  Musculoskeletal: He exhibits no edema.  Lymphadenopathy:    He has no cervical adenopathy.  Psychiatric: He has a normal mood and affect. His behavior is normal.          Assessment & Plan:   Presurgical clearance. EKG sinus rhythm. No significant changes compared with prior tracing 2014. No acute ST-T changes. Probable incomplete right bundle branch block. Forms completed for surgery   GERD. Symptomatically stable Nexium. Encouraged to lose some weight.

## 2015-09-02 NOTE — Progress Notes (Signed)
Pre visit review using our clinic review tool, if applicable. No additional management support is needed unless otherwise documented below in the visit note. 

## 2015-10-22 ENCOUNTER — Ambulatory Visit: Payer: Self-pay | Admitting: Orthopedic Surgery

## 2015-10-22 NOTE — Progress Notes (Signed)
Preoperative surgical orders have been place into the Epic hospital system for Isaiah Wilson on 10/22/2015, 1:44 PM  by Patrica DuelPERKINS, Taiten Brawn for surgery on 11-11-15.  Preop Total Knee orders including Experal, IV Tylenol, and IV Decadron as long as there are no contraindications to the above medications. Isaiah Peacerew Cadell Gabrielson, PA-C

## 2015-10-30 ENCOUNTER — Other Ambulatory Visit (HOSPITAL_COMMUNITY): Payer: Self-pay | Admitting: *Deleted

## 2015-10-30 NOTE — Patient Instructions (Addendum)
Isaiah RuddJames L Wilson  10/30/2015   Your procedure is scheduled on: 11-11-15  [facility]  [facility] to 3rd floor to  Short Stay Center at 930  AM.  Call this number if you have problems the morning of surgery [phone]   Remember: ONLY 1 PERSON MAY GO WITH YOU TO SHORT STAY TO GET  READY MORNING OF YOUR SURGERY.  Do not eat food or drink liquids :After Midnight.     Take these medicines the morning of surgery with A SIP OF WATER: ASTELIN NASAL SPRAY, NEXIUM, HYDROCODONE IF NEEDED, LORATADINE (CLARITIN)                              You may not have any metal on your body including hair pins and              piercings  Do not wear jewelry, make-up, lotions, powders or perfumes, deodorant             Do not wear nail polish.  Do not shave  48 hours prior to surgery.              Men may shave face and neck.   Do not bring valuables to the hospital. [facility] IS NOT             RESPONSIBLE   FOR VALUABLES.  Contacts, dentures or bridgework may not be worn into surgery.  Leave suitcase in the car. After surgery it may be brought to your room.                  Please read over the following fact sheets you were given: _____________________________________________________________________             [facility] - Preparing for Surgery Before surgery, you can play an important role.  Because skin is not sterile, your skin needs to be as free of germs as possible.  You can reduce the number of germs on your skin by washing with CHG (chlorahexidine gluconate) soap before surgery.  CHG is an antiseptic cleaner which kills germs and bonds with the skin to continue killing germs even after washing. Please DO NOT use if you have an allergy to CHG or antibacterial soaps.  If your skin becomes reddened/irritated stop using the CHG and inform your nurse when you arrive at Short Stay. Do not shave (including legs and underarms) for at least 48  hours prior to the first CHG shower.  You may shave your face/neck. Please follow these instructions carefully:  1.  Shower with CHG Soap the night before surgery and the  morning of Surgery.  2.  If you choose to wash your hair, wash your hair first as usual with your  normal  shampoo.  3.  After you shampoo, rinse your hair and body thoroughly to remove the  shampoo.                           4.  Use CHG as you would any other liquid soap.  You can apply chg directly  to the skin and wash                       Gently with a scrungie or clean washcloth.  5.  Apply the CHG Soap to your body ONLY FROM THE NECK DOWN.   Do not use on face/ open                           Wound or open sores. Avoid contact with eyes, ears mouth and genitals (private parts).                       Wash face,  Genitals (private parts) with your normal soap.             6.  Wash thoroughly, paying special attention to the area where your surgery  will be performed.  7.  Thoroughly rinse your body with warm water from the neck down.  8.  DO NOT shower/wash with your normal soap after using and rinsing off  the CHG Soap.                9.  Pat yourself dry with a clean towel.            10.  Wear clean pajamas.            11.  Place clean sheets on your bed the night of your first shower and do not  sleep with pets. Day of Surgery : Do not apply any lotions/deodorants the morning of surgery.  Please wear clean clothes to the hospital/surgery center.  FAILURE TO FOLLOW THESE INSTRUCTIONS MAY RESULT IN THE CANCELLATION OF YOUR SURGERY PATIENT SIGNATURE_________________________________  NURSE SIGNATURE__________________________________  ________________________________________________________________________   Isaiah Wilson  An incentive spirometer is a tool that can help keep your lungs clear and active. This tool measures how well you are filling your lungs with each breath. Taking long deep breaths may help  reverse or decrease the chance of developing breathing (pulmonary) problems (especially infection) following:  A long period of time when you are unable to move or be active. BEFORE THE PROCEDURE   If the spirometer includes an indicator to show your best effort, your nurse or respiratory therapist will set it to a desired goal.  If possible, sit up straight or lean slightly forward. Try not to slouch.  Hold the incentive spirometer in an upright position. INSTRUCTIONS FOR USE   Sit on the edge of your bed if possible, or sit up as far as you can in bed or on a chair.  Hold the incentive spirometer in an upright position.  Breathe out normally.  Place the mouthpiece in your mouth and seal your lips tightly around it.  Breathe in slowly and as deeply as possible, raising the piston or the ball toward the top of the column.  Hold your breath for 3-5 seconds or for as long as possible. Allow the piston or ball to fall to the bottom of the column.  Remove the mouthpiece from your mouth and breathe out normally.  Rest for a few seconds and repeat Steps 1 through 7 at least 10 times every 1-2 hours when you are awake. Take your time and take a few normal breaths between deep breaths.  The spirometer may include an indicator to show your best effort. Use the indicator as a goal to work toward during each repetition.  After each set of 10 deep breaths, practice coughing to be sure your lungs are clear. If you have an incision (the cut made at the time of surgery), support your incision when coughing by placing a  pillow or rolled up towels firmly against it. Once you are able to get out of bed, walk around indoors and cough well. You may stop using the incentive spirometer when instructed by your caregiver.  RISKS AND COMPLICATIONS  Take your time so you do not get dizzy or light-headed.  If you are in pain, you may need to take or ask for pain medication before doing incentive spirometry.  It is harder to take a deep breath if you are having pain. AFTER USE  Rest and breathe slowly and easily.  It can be helpful to keep track of a log of your progress. Your caregiver can provide you with a simple table to help with this. If you are using the spirometer at home, follow these instructions: [place] IF:   You are having difficultly using the spirometer.  You have trouble using the spirometer as often as instructed.  Your pain medication is not giving enough relief while using the spirometer.  You develop fever of 100.5 F (38.1 C) or higher. SEEK IMMEDIATE MEDICAL CARE IF:   You cough up bloody sputum that had not been present before.  You develop fever of 102 F (38.9 C) or greater.  You develop worsening pain at or near the incision site. MAKE SURE YOU:   Understand these instructions.  Will watch your condition.  Will get help right away if you are not doing well or get worse. Document Released: 09/26/2006 Document Revised: 08/08/2011 Document Reviewed: 11/27/2006 Exit[employer] Patient Information 2014 [employer], [place].   ________________________________________________________________________  WHAT IS A BLOOD TRANSFUSION? Blood Transfusion Information  A transfusion is the replacement of blood or some of its parts. Blood is made up of multiple cells which provide different functions.  Red blood cells carry oxygen and are used for blood loss replacement.  White blood cells fight against infection.  Platelets control bleeding.  Plasma helps clot blood.  Other blood products are available for specialized needs, such as hemophilia or other clotting disorders. BEFORE THE TRANSFUSION  Who gives blood for transfusions?   Healthy volunteers who are fully evaluated to make sure their blood is safe. This is blood bank blood. Transfusion therapy is the safest it has ever been in the practice of medicine. Before blood is taken from a donor, a complete  history is taken to make sure that person has no history of diseases nor engages in risky social behavior (examples are intravenous drug use or sexual activity with multiple partners). The donor's travel history is screened to minimize risk of transmitting infections, such as malaria. The donated blood is tested for signs of infectious diseases, such as HIV and hepatitis. The blood is then tested to be sure it is compatible with you in order to minimize the chance of a transfusion reaction. If you or a relative donates blood, this is often done in anticipation of surgery and is not appropriate for emergency situations. It takes many days to process the donated blood. RISKS AND COMPLICATIONS Although transfusion therapy is very safe and saves many lives, the main dangers of transfusion include:   Getting an infectious disease.  Developing a transfusion reaction. This is an allergic reaction to something in the blood you were given. Every precaution is taken to prevent this. The decision to have a blood transfusion has been considered carefully by your caregiver before blood is given. Blood is not given unless the benefits outweigh the risks. AFTER THE TRANSFUSION  Right after receiving a blood transfusion, you  will usually feel much better and more energetic. This is especially true if your red blood cells have gotten low (anemic). The transfusion raises the level of the red blood cells which carry oxygen, and this usually causes an energy increase.  The nurse administering the transfusion will monitor you carefully for complications. HOME CARE INSTRUCTIONS  No special instructions are needed after a transfusion. You may find your energy is better. Speak with your caregiver about any limitations on activity for underlying diseases you may have. SEEK MEDICAL CARE IF:   Your condition is not improving after your transfusion.  You develop redness or irritation at the intravenous (IV) site. SEEK  IMMEDIATE MEDICAL CARE IF:  Any of the following symptoms occur over the next 12 hours:  Shaking chills.  You have a temperature by mouth above 102 F (38.9 C), not controlled by medicine.  Chest, back, or muscle pain.  People around you feel you are not acting correctly or are confused.  Shortness of breath or difficulty breathing.  Dizziness and fainting.  You get a rash or develop hives.  You have a decrease in urine output.  Your urine turns a dark color or changes to pink, red, or brown. Any of the following symptoms occur over the next 10 days:  You have a temperature by mouth above 102 F (38.9 C), not controlled by medicine.  Shortness of breath.  Weakness after normal activity.  The white part of the eye turns yellow (jaundice).  You have a decrease in the amount of urine or are urinating less often.  Your urine turns a dark color or changes to pink, red, or brown. Document Released: 05/13/2000 Document Revised: 08/08/2011 Document Reviewed: 12/31/2007 [facility] Patient Information 2014 [place], [place].  _______________________________________________________________________

## 2015-10-30 NOTE — Progress Notes (Signed)
MEDICAL CLEARANCE NOTE DR Caryl NeverBURCHETTE 09-02-15 ON CHART AND IN EPIC EKG 09-02-15 DR BURCHETTE ON CHART AND IN EPIC

## 2015-11-03 ENCOUNTER — Encounter (HOSPITAL_COMMUNITY)
Admission: RE | Admit: 2015-11-03 | Discharge: 2015-11-03 | Disposition: A | Discharge status: Home / Self Care | Payer: Medicare Other | Source: Ambulatory Visit | Attending: Orthopedic Surgery | Admitting: Orthopedic Surgery

## 2015-11-03 ENCOUNTER — Encounter (HOSPITAL_COMMUNITY): Payer: Self-pay

## 2015-11-03 DIAGNOSIS — T84093A Other mechanical complication of internal left knee prosthesis, initial encounter: Secondary | ICD-10-CM | POA: Insufficient documentation

## 2015-11-03 DIAGNOSIS — Z01812 Encounter for preprocedural laboratory examination: Secondary | ICD-10-CM | POA: Diagnosis present

## 2015-11-03 DIAGNOSIS — Y929 Unspecified place or not applicable: Secondary | ICD-10-CM | POA: Insufficient documentation

## 2015-11-03 DIAGNOSIS — Y792 Prosthetic and other implants, materials and accessory orthopedic devices associated with adverse incidents: Secondary | ICD-10-CM | POA: Diagnosis not present

## 2015-11-03 DIAGNOSIS — Y834 Other reconstructive surgery as the cause of abnormal reaction of the patient, or of later complication, without mention of misadventure at the time of the procedure: Secondary | ICD-10-CM | POA: Diagnosis not present

## 2015-11-03 HISTORY — DX: Adverse effect of unspecified anesthetic, initial encounter: T41.45XA

## 2015-11-03 HISTORY — DX: Other complications of anesthesia, initial encounter: T88.59XA

## 2015-11-03 HISTORY — DX: Unspecified hearing loss, unspecified ear: H91.90

## 2015-11-03 LAB — URINALYSIS, ROUTINE W REFLEX MICROSCOPIC
BILIRUBIN URINE: NEGATIVE
Glucose, UA: NEGATIVE mg/dL
HGB URINE DIPSTICK: NEGATIVE
KETONES UR: NEGATIVE mg/dL
Leukocytes, UA: NEGATIVE
NITRITE: NEGATIVE
PH: 5.5 (ref 5.0–8.0)
Protein, ur: NEGATIVE mg/dL
SPECIFIC GRAVITY, URINE: 1.021 (ref 1.005–1.030)

## 2015-11-03 LAB — CBC
HCT: 41.4 % (ref 39.0–52.0)
Hemoglobin: 14 g/dL (ref 13.0–17.0)
MCH: 29.2 pg (ref 26.0–34.0)
MCHC: 33.8 g/dL (ref 30.0–36.0)
MCV: 86.4 fL (ref 78.0–100.0)
PLATELETS: 192 10*3/uL (ref 150–400)
RBC: 4.79 MIL/uL (ref 4.22–5.81)
RDW: 14 % (ref 11.5–15.5)
WBC: 7.5 10*3/uL (ref 4.0–10.5)

## 2015-11-03 LAB — COMPREHENSIVE METABOLIC PANEL
ALT: 36 U/L (ref 17–63)
ANION GAP: 7 (ref 5–15)
AST: 35 U/L (ref 15–41)
Albumin: 4.5 g/dL (ref 3.5–5.0)
Alkaline Phosphatase: 57 U/L (ref 38–126)
BUN: 14 mg/dL (ref 6–20)
CALCIUM: 9.1 mg/dL (ref 8.9–10.3)
CHLORIDE: 108 mmol/L (ref 101–111)
CO2: 23 mmol/L (ref 22–32)
CREATININE: 1 mg/dL (ref 0.61–1.24)
Glucose, Bld: 103 mg/dL — ABNORMAL HIGH (ref 65–99)
POTASSIUM: 4 mmol/L (ref 3.5–5.1)
Sodium: 138 mmol/L (ref 135–145)
TOTAL PROTEIN: 7.5 g/dL (ref 6.5–8.1)
Total Bilirubin: 0.7 mg/dL (ref 0.3–1.2)

## 2015-11-03 LAB — PROTIME-INR
INR: 1.05 (ref 0.00–1.49)
Prothrombin Time: 13.9 seconds (ref 11.6–15.2)

## 2015-11-03 LAB — SURGICAL PCR SCREEN
MRSA, PCR: NEGATIVE
STAPHYLOCOCCUS AUREUS: NEGATIVE

## 2015-11-03 LAB — APTT: aPTT: 31 seconds (ref 24–37)

## 2015-11-03 NOTE — Progress Notes (Signed)
   11/03/15 1112  OBSTRUCTIVE SLEEP APNEA  Have you ever been diagnosed with sleep apnea through a sleep study? No  Do you snore loudly (loud enough to be heard through closed doors)?  0  Do you often feel tired, fatigued, or sleepy during the daytime (such as falling asleep during driving or talking to someone)? 1  Has anyone observed you stop breathing during your sleep? 1  Do you have, or are you being treated for high blood pressure? 1  BMI more than 35 kg/m2? 0  Age > 50 (1-yes) 1  Neck circumference greater than:Male 16 inches or larger, Male 17inches or larger? 0  Male Gender (Yes=1) 1  Obstructive Sleep Apnea Score 5  Score 5 or greater  Results sent to PCP

## 2015-11-04 NOTE — H&P (Signed)
TOTAL KNEE REVISION ADMISSION H&P  Patient is being admitted for left revision total knee arthroplasty.  Subjective:  Chief Complaint:left knee pain.  HPI: [name], 81 y.o. male, has a history of pain and functional disability in the left knee(s) due to arthritis and failed previous arthroplasty and patient has failed non-surgical conservative treatments for greater than 12 weeks to include NSAID's and/or analgesics, flexibility and strengthening excercises, supervised PT with diminished ADL's post treatment, use of assistive devices and activity modification. The indications for the revision of the total knee arthroplasty are bearing surface wear leading to symptomatic synovitis and tibiofemoral instability. Onset of symptoms was gradual starting >10 years ago with gradually worsening course since that time.  Prior procedures on the left knee(s) include arthroplasty.  Patient currently rates pain in the left knee(s) at 6 out of 10 with activity. There is night pain, worsening of pain with activity and weight bearing, pain that interferes with activities of daily living, pain with passive range of motion and joint swelling.  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 This condition presents safety issues increasing the risk of falls.  There is no current active infection.  Patient Active Problem List   Diagnosis Date Noted  . Obesity (BMI 30-39.9) 02/17/2014  . Seborrheic keratoses 02/08/2013  . Paresthesia 02/09/2011  . OVERWEIGHT 07/05/2008  . HIATAL HERNIA 09/25/2007  . BPH (benign prostatic hypertrophy) 07/14/2007  . ISCHEMIC COLITIS 07/12/2007  . HYPERLIPIDEMIA 05/07/2007  . ANXIETY 05/07/2007  . NEUROPATHY 05/07/2007  . ALLERGIC RHINITIS 05/07/2007  . GERD 05/07/2007  . BARRETTS ESOPHAGUS 05/07/2007  . Osteoarthritis 05/07/2007   Past Medical History  Diagnosis Date  . Allergic rhinitis   . Hyperlipidemia   . Overweight(278.02)    . GERD (gastroesophageal reflux disease)   . Barrett's esophagus   . Colitis, ischemic (HCC)     X 2  . Benign prostatic hypertrophy   . Neuropathy (HCC)     LEFT HAND NUMB AND BURNS ALL THE TIME  . Allergy   . Hypertension     PT DENIES HTN SAYS CARDURA FOR BLADDER  . DJD (degenerative joint disease)     SPINE, AND OA  . Complication of anesthesia     SLOW TO AWAKEN AFTER 1 SURGERY 20  YRS AGO  . HOH (hard of hearing)     BOTH EARS    Past Surgical History  Procedure Laterality Date  . Laparoscopic cholecystectomy  1993    Dr. Luan Moore  . 2 neck surgeries  2005-2006    Dr. Georgia Dom cx decompression, diskectomy,fusion C6-7 allograftWITH PLATE  . Right total knee replacement  06/2006    Dr. Despina Hick  . Left total knee replacement  07/2007    Dr. Despina Hick  . Appendectomy    . Foot surgery      right x 3  . Septoplasty  1975, May 2013    x3    Current outpatient prescriptions:  .  Acetaminophen (TYLENOL ARTHRITIS EXT RELIEF PO), Take 2 capsules by mouth 2 (two) times daily. , Disp: , Rfl:  .  aspirin 81 MG tablet, Take 81 mg by mouth daily.  , Disp: , Rfl:  .  azelastine (ASTELIN) 0.1 % nasal spray, Place 2 sprays into both nostrils 2 (two) times daily. Use in each nostril as directed, Disp: , Rfl:  .  doxazosin (CARDURA) 8 MG tablet, TAKE ONE TABLET BY MOUTH AT BEDTIME (Patient taking differently: Take 8 mg  by mouth at bedtime. TAKE ONE TABLET BY MOUTH AT BEDTIME), Disp: 30 tablet, Rfl: 11 .  esomeprazole (NEXIUM) 40 MG capsule, Take 1 capsule (40 mg total) by mouth daily before breakfast., Disp: 30 capsule, Rfl: 5 .  HYDROcodone-acetaminophen (NORCO/VICODIN) 5-325 MG tablet, Take 1 tablet by mouth every 8 (eight) hours as needed (For pain.)., Disp: , Rfl:  .  loratadine (CLARITIN) 10 MG tablet, Take 10 mg by mouth daily as needed for allergies. , Disp: , Rfl:  .  Lysine 500 MG CAPS, Take 500 mg by mouth daily. , Disp: , Rfl:  .  Multiple Vitamins-Minerals (CENTRUM) tablet,  Take 1 tablet by mouth daily.  , Disp: , Rfl:  .  traMADol (ULTRAM) 50 MG tablet, TAKE 1 TABLET BY MOUTH THREE TIMES DAILY AS NEEDED (Patient taking differently: TAKE 1 TABLET BY MOUTH THREE TIMES DAILY AS NEEDED FOR PAIN.), Disp: 90 tablet, Rfl: 5 .  UNABLE TO FIND, Med Name: allergy shots weekly, Disp: , Rfl:   Allergies  Allergen Reactions  . Tamsulosin Other (See Comments)    pt states INTOL to Flomax - "it affects my eyes"    Social History  Substance Use Topics  . Smoking status: Never Smoker   . Smokeless tobacco: Never Used  . Alcohol Use: No    Family History  Problem Relation Age of Onset  . Colon cancer Neg Hx   . Stomach cancer Neg Hx   . Rectal cancer Neg Hx   . Esophageal cancer Neg Hx       Review of Systems  Constitutional: Negative.   HENT: Negative.   Eyes: Negative.   Respiratory: Negative.   Cardiovascular: Positive for orthopnea. Negative for chest pain, palpitations, claudication, leg swelling and PND.  Gastrointestinal: Positive for heartburn. Negative for nausea, vomiting, abdominal pain, diarrhea, constipation, blood in stool and melena.       Positive for dysphagia  Genitourinary: Negative.   Musculoskeletal: Positive for myalgias and joint pain. Negative for back pain, falls and neck pain.  Skin: Negative.   Neurological: Negative.   Endo/Heme/Allergies: Positive for environmental allergies. Negative for polydipsia. Does not bruise/bleed easily.  Psychiatric/Behavioral: Negative.      Objective:  Physical Exam  Constitutional: He is oriented to person, place, and time. He appears well-developed. No distress.  Obese  HENT:  Head: Normocephalic and atraumatic.  Right Ear: External ear normal.  Left Ear: External ear normal.  Nose: Nose normal.  Mouth/Throat: Oropharynx is clear and moist.  Eyes: Conjunctivae and EOM are normal.  Neck: Normal range of motion. Neck supple.  Cardiovascular: Normal rate, regular rhythm, normal heart sounds and  intact distal pulses.   No murmur heard. Respiratory: Effort normal and breath sounds normal. No respiratory distress. He has no wheezes.  GI: Soft. Bowel sounds are normal. He exhibits no distension. There is no tenderness.  Musculoskeletal:       Right hip: Normal.       Left hip: Normal.  His left knee shows a moderate effusion. There is no warmth about the knee, range about 5 to 120, but moderate instabilit  Neurological: He is alert and oriented to person, place, and time. He has normal strength and normal reflexes. No sensory deficit.  Skin: No rash noted. He is not diaphoretic. No erythema.  Psychiatric: He has a normal mood and affect. His behavior is normal.    Vital signs in last 24 hours: Temp:  [98.1 F (36.7 C)] 98.1 F (36.7 C) (06/06  1104) Pulse Rate:  [60] 60 (06/06 1104) Resp:  [18] 18 (06/06 1104) BP: (163)/(77) 163/77 mmHg (06/06 1104) SpO2:  [96 %] 96 % (06/06 1104) Weight:  [111.403 kg (245 lb 9.6 oz)] 111.403 kg (245 lb 9.6 oz) (06/06 1104)  Labs:  Estimated body mass index is 33.49 kg/(m^2) as calculated from the following:   Height as of this encounter: 5\' 11"  (1.803 m).   Weight as of this encounter: 108.863 kg (240 lb).  Imaging Review Plain radiographs demonstrate severe degenerative joint disease of the left knee(s). The overall alignment is neutral.There is no evidence of loosening of the femoral and tibial components based on bone scan. The bone quality appears to be good for age and reported activity level.   Assessment/Plan:  End stage primary osteoarthritis, left knee(s) with failed previous arthroplasty.   The patient history, physical examination, clinical judgment of the provider and imaging studies are consistent with end stage degenerative joint disease of the left knee(s), previous total knee arthroplasty. Revision total knee arthroplasty is deemed medically necessary. The treatment options including medical management, injection therapy,  arthroscopy and revision arthroplasty were discussed at length. The risks and benefits of revision total knee arthroplasty were presented and reviewed. The risks due to aseptic loosening, infection, stiffness, patella tracking problems, thromboembolic complications and other imponderables were discussed. The patient acknowledged the explanation, agreed to proceed with the plan and consent was signed. Patient is being admitted for inpatient treatment for surgery, pain control, PT, OT, prophylactic antibiotics, VTE prophylaxis, progressive ambulation and ADL's and discharge planning.The patient is planning to be discharged home with home health services    PCP: Dr. Evelena Peat   Dimitri Ped, PA-C

## 2015-11-11 ENCOUNTER — Inpatient Hospital Stay (HOSPITAL_COMMUNITY): Payer: Medicare Other | Admitting: Certified Registered Nurse Anesthetist

## 2015-11-11 ENCOUNTER — Encounter (HOSPITAL_COMMUNITY): Payer: Self-pay | Admitting: *Deleted

## 2015-11-11 ENCOUNTER — Encounter (HOSPITAL_COMMUNITY)
Admission: RE | Disposition: A | Discharge status: Home Health | Payer: Self-pay | Source: Ambulatory Visit | Attending: Orthopedic Surgery

## 2015-11-11 ENCOUNTER — Inpatient Hospital Stay (HOSPITAL_COMMUNITY)
Admission: RE | Admit: 2015-11-11 | Discharge: 2015-11-13 | DRG: 489 | Disposition: A | Discharge status: Home Health | Payer: Medicare Other | Source: Ambulatory Visit | Attending: Orthopedic Surgery | Admitting: Orthopedic Surgery

## 2015-11-11 DIAGNOSIS — K219 Gastro-esophageal reflux disease without esophagitis: Secondary | ICD-10-CM | POA: Diagnosis present

## 2015-11-11 DIAGNOSIS — T84018A Broken internal joint prosthesis, other site, initial encounter: Secondary | ICD-10-CM

## 2015-11-11 DIAGNOSIS — Z96651 Presence of right artificial knee joint: Secondary | ICD-10-CM | POA: Diagnosis present

## 2015-11-11 DIAGNOSIS — Z7982 Long term (current) use of aspirin: Secondary | ICD-10-CM | POA: Diagnosis not present

## 2015-11-11 DIAGNOSIS — E669 Obesity, unspecified: Secondary | ICD-10-CM | POA: Diagnosis present

## 2015-11-11 DIAGNOSIS — N400 Enlarged prostate without lower urinary tract symptoms: Secondary | ICD-10-CM | POA: Diagnosis present

## 2015-11-11 DIAGNOSIS — M25562 Pain in left knee: Secondary | ICD-10-CM | POA: Diagnosis present

## 2015-11-11 DIAGNOSIS — G629 Polyneuropathy, unspecified: Secondary | ICD-10-CM | POA: Diagnosis present

## 2015-11-11 DIAGNOSIS — Z6834 Body mass index (BMI) 34.0-34.9, adult: Secondary | ICD-10-CM

## 2015-11-11 DIAGNOSIS — T84023A Instability of internal left knee prosthesis, initial encounter: Principal | ICD-10-CM | POA: Diagnosis present

## 2015-11-11 DIAGNOSIS — T84093A Other mechanical complication of internal left knee prosthesis, initial encounter: Secondary | ICD-10-CM

## 2015-11-11 DIAGNOSIS — Z79899 Other long term (current) drug therapy: Secondary | ICD-10-CM

## 2015-11-11 DIAGNOSIS — K2270 Barrett's esophagus without dysplasia: Secondary | ICD-10-CM | POA: Diagnosis present

## 2015-11-11 DIAGNOSIS — E785 Hyperlipidemia, unspecified: Secondary | ICD-10-CM | POA: Diagnosis present

## 2015-11-11 DIAGNOSIS — Y792 Prosthetic and other implants, materials and accessory orthopedic devices associated with adverse incidents: Secondary | ICD-10-CM | POA: Diagnosis present

## 2015-11-11 DIAGNOSIS — H9190 Unspecified hearing loss, unspecified ear: Secondary | ICD-10-CM | POA: Diagnosis present

## 2015-11-11 DIAGNOSIS — Z96659 Presence of unspecified artificial knee joint: Secondary | ICD-10-CM

## 2015-11-11 HISTORY — PX: TOTAL KNEE REVISION: SHX996

## 2015-11-11 LAB — TYPE AND SCREEN
ABO/RH(D): A POS
Antibody Screen: NEGATIVE

## 2015-11-11 SURGERY — TOTAL KNEE REVISION
Anesthesia: Monitor Anesthesia Care | Site: Knee | Laterality: Left

## 2015-11-11 MED ORDER — BUPIVACAINE LIPOSOME 1.3 % IJ SUSP
INTRAMUSCULAR | Status: DC | PRN
Start: 2015-11-11 — End: 2015-11-11
  Administered 2015-11-11: 20 mL

## 2015-11-11 MED ORDER — SODIUM CHLORIDE 0.9 % IR SOLN
Status: DC | PRN
  Administered 2015-11-11: 1000 mL

## 2015-11-11 MED ORDER — MIDAZOLAM HCL 5 MG/5ML IJ SOLN
INTRAMUSCULAR | Status: DC | PRN
  Administered 2015-11-11: 2 mg via INTRAVENOUS

## 2015-11-11 MED ORDER — ACETAMINOPHEN 10 MG/ML IV SOLN
1000.0000 mg | Freq: Once | INTRAVENOUS | Status: AC
  Administered 2015-11-11: 1000 mg via INTRAVENOUS

## 2015-11-11 MED ORDER — LACTATED RINGERS IV SOLN
INTRAVENOUS | Status: DC
  Administered 2015-11-11 (×3): via INTRAVENOUS

## 2015-11-11 MED ORDER — OXYCODONE HCL 5 MG PO TABS: 5.0000 mg | ORAL_TABLET | Freq: Once | ORAL | Status: DC | PRN

## 2015-11-11 MED ORDER — PROMETHAZINE HCL 25 MG/ML IJ SOLN: 6.2500 mg | INTRAMUSCULAR | Status: DC | PRN

## 2015-11-11 MED ORDER — PROPOFOL 10 MG/ML IV BOLUS
INTRAVENOUS | Status: DC | PRN
  Administered 2015-11-11: 20 mg via INTRAVENOUS
  Administered 2015-11-11: 30 mg via INTRAVENOUS

## 2015-11-11 MED ORDER — ACETAMINOPHEN 325 MG PO TABS: 650.0000 mg | ORAL_TABLET | Freq: Four times a day (QID) | ORAL | Status: DC | PRN

## 2015-11-11 MED ORDER — METOCLOPRAMIDE HCL 5 MG/ML IJ SOLN
5.0000 mg | Freq: Three times a day (TID) | INTRAMUSCULAR | Status: DC | PRN
Start: 2015-11-11 — End: 2015-11-13

## 2015-11-11 MED ORDER — PANTOPRAZOLE SODIUM 40 MG PO TBEC
80.0000 mg | DELAYED_RELEASE_TABLET | Freq: Every day | ORAL | Status: DC
  Administered 2015-11-12: 80 mg via ORAL
  Filled 2015-11-11: qty 2

## 2015-11-11 MED ORDER — FLEET ENEMA 7-19 GM/118ML RE ENEM: 1.0000 | ENEMA | Freq: Once | RECTAL | Status: DC | PRN

## 2015-11-11 MED ORDER — PROPOFOL 10 MG/ML IV BOLUS
INTRAVENOUS | Status: AC
  Filled 2015-11-11: qty 40

## 2015-11-11 MED ORDER — ACETAMINOPHEN 650 MG RE SUPP: 650.0000 mg | Freq: Four times a day (QID) | RECTAL | Status: DC | PRN

## 2015-11-11 MED ORDER — METHOCARBAMOL 500 MG PO TABS: 500.0000 mg | ORAL_TABLET | Freq: Four times a day (QID) | ORAL | Status: DC | PRN

## 2015-11-11 MED ORDER — METHOCARBAMOL 500 MG PO TABS
500.0000 mg | ORAL_TABLET | Freq: Four times a day (QID) | ORAL | Status: DC | PRN
  Administered 2015-11-13 (×2): 500 mg via ORAL
  Filled 2015-11-11 (×2): qty 1

## 2015-11-11 MED ORDER — TRAMADOL HCL 50 MG PO TABS
50.0000 mg | ORAL_TABLET | Freq: Four times a day (QID) | ORAL | Status: DC | PRN
Start: 2015-11-11 — End: 2015-11-13
  Administered 2015-11-12 – 2015-11-13 (×2): 100 mg via ORAL
  Filled 2015-11-11 (×2): qty 2

## 2015-11-11 MED ORDER — DIPHENHYDRAMINE HCL 12.5 MG/5ML PO ELIX: 12.5000 mg | ORAL_SOLUTION | ORAL | Status: DC | PRN

## 2015-11-11 MED ORDER — SODIUM CHLORIDE 0.9 % IV SOLN: INTRAVENOUS | Status: DC

## 2015-11-11 MED ORDER — RIVAROXABAN 10 MG PO TABS
10.0000 mg | ORAL_TABLET | Freq: Every day | ORAL | Status: DC
  Administered 2015-11-12 – 2015-11-13 (×2): 10 mg via ORAL
  Filled 2015-11-11 (×2): qty 1

## 2015-11-11 MED ORDER — ONDANSETRON HCL 4 MG/2ML IJ SOLN
INTRAMUSCULAR | Status: DC | PRN
  Administered 2015-11-11: 4 mg via INTRAVENOUS

## 2015-11-11 MED ORDER — ACETAMINOPHEN 10 MG/ML IV SOLN
INTRAVENOUS | Status: AC
  Filled 2015-11-11: qty 100

## 2015-11-11 MED ORDER — BUPIVACAINE HCL 0.25 % IJ SOLN
INTRAMUSCULAR | Status: DC | PRN
Start: 2015-11-11 — End: 2015-11-11
  Administered 2015-11-11: 20 mL

## 2015-11-11 MED ORDER — BUPIVACAINE LIPOSOME 1.3 % IJ SUSP
20.0000 mL | Freq: Once | INTRAMUSCULAR | Status: DC
  Filled 2015-11-11: qty 20

## 2015-11-11 MED ORDER — LIDOCAINE HCL (CARDIAC) 20 MG/ML IV SOLN
INTRAVENOUS | Status: AC
  Filled 2015-11-11: qty 5

## 2015-11-11 MED ORDER — PHENOL 1.4 % MT LIQD
1.0000 | OROMUCOSAL | Status: DC | PRN
Start: 2015-11-11 — End: 2015-11-13

## 2015-11-11 MED ORDER — CEFAZOLIN SODIUM-DEXTROSE 2-4 GM/100ML-% IV SOLN
2.0000 g | Freq: Four times a day (QID) | INTRAVENOUS | Status: AC
  Administered 2015-11-11 (×2): 2 g via INTRAVENOUS
  Filled 2015-11-11 (×2): qty 100

## 2015-11-11 MED ORDER — BUPIVACAINE IN DEXTROSE 0.75-8.25 % IT SOLN
INTRATHECAL | Status: DC | PRN
  Administered 2015-11-11: 2 mL via INTRATHECAL

## 2015-11-11 MED ORDER — LORATADINE 10 MG PO TABS: 10.0000 mg | ORAL_TABLET | Freq: Every day | ORAL | Status: DC | PRN

## 2015-11-11 MED ORDER — AZELASTINE HCL 0.1 % NA SOLN
2.0000 | Freq: Two times a day (BID) | NASAL | Status: DC
  Administered 2015-11-11 – 2015-11-13 (×3): 2 via NASAL
  Filled 2015-11-11: qty 30

## 2015-11-11 MED ORDER — TRANEXAMIC ACID 1000 MG/10ML IV SOLN
1000.0000 mg | Freq: Once | INTRAVENOUS | Status: AC
  Administered 2015-11-11: 1000 mg via INTRAVENOUS
  Filled 2015-11-11: qty 10

## 2015-11-11 MED ORDER — CEFAZOLIN SODIUM-DEXTROSE 2-4 GM/100ML-% IV SOLN
INTRAVENOUS | Status: AC
  Filled 2015-11-11: qty 100

## 2015-11-11 MED ORDER — DEXAMETHASONE SODIUM PHOSPHATE 10 MG/ML IJ SOLN
10.0000 mg | Freq: Once | INTRAMUSCULAR | Status: AC
  Administered 2015-11-12: 10 mg via INTRAVENOUS
  Filled 2015-11-11: qty 1

## 2015-11-11 MED ORDER — CEFAZOLIN SODIUM-DEXTROSE 2-4 GM/100ML-% IV SOLN
2.0000 g | INTRAVENOUS | Status: AC
  Administered 2015-11-11: 2 g via INTRAVENOUS

## 2015-11-11 MED ORDER — MIDAZOLAM HCL 2 MG/2ML IJ SOLN
INTRAMUSCULAR | Status: AC
  Filled 2015-11-11: qty 2

## 2015-11-11 MED ORDER — TRANEXAMIC ACID 1000 MG/10ML IV SOLN
1000.0000 mg | INTRAVENOUS | Status: AC
  Administered 2015-11-11: 1000 mg via INTRAVENOUS
  Filled 2015-11-11: qty 10

## 2015-11-11 MED ORDER — DOXAZOSIN MESYLATE 8 MG PO TABS
8.0000 mg | ORAL_TABLET | Freq: Every day | ORAL | Status: DC
  Administered 2015-11-11 – 2015-11-12 (×2): 8 mg via ORAL
  Filled 2015-11-11 (×4): qty 1

## 2015-11-11 MED ORDER — BISACODYL 10 MG RE SUPP: 10.0000 mg | Freq: Every day | RECTAL | Status: DC | PRN

## 2015-11-11 MED ORDER — FENTANYL CITRATE (PF) 100 MCG/2ML IJ SOLN
INTRAMUSCULAR | Status: AC
  Filled 2015-11-11: qty 2

## 2015-11-11 MED ORDER — ONDANSETRON HCL 4 MG/2ML IJ SOLN
INTRAMUSCULAR | Status: AC
  Filled 2015-11-11: qty 2

## 2015-11-11 MED ORDER — SODIUM CHLORIDE 0.9 % IV SOLN
INTRAVENOUS | Status: DC
  Administered 2015-11-11: 21:00:00 via INTRAVENOUS

## 2015-11-11 MED ORDER — BUPIVACAINE HCL (PF) 0.25 % IJ SOLN
INTRAMUSCULAR | Status: AC
Start: 2015-11-11 — End: 2015-11-11
  Filled 2015-11-11: qty 30

## 2015-11-11 MED ORDER — DEXAMETHASONE SODIUM PHOSPHATE 10 MG/ML IJ SOLN
INTRAMUSCULAR | Status: AC
  Filled 2015-11-11: qty 1

## 2015-11-11 MED ORDER — PROPOFOL 10 MG/ML IV BOLUS
INTRAVENOUS | Status: AC
  Filled 2015-11-11: qty 20

## 2015-11-11 MED ORDER — MENTHOL 3 MG MT LOZG: 1.0000 | LOZENGE | OROMUCOSAL | Status: DC | PRN

## 2015-11-11 MED ORDER — METOCLOPRAMIDE HCL 5 MG PO TABS
5.0000 mg | ORAL_TABLET | Freq: Three times a day (TID) | ORAL | Status: DC | PRN
Start: 2015-11-11 — End: 2015-11-13

## 2015-11-11 MED ORDER — OXYCODONE HCL 5 MG/5ML PO SOLN
5.0000 mg | Freq: Once | ORAL | Status: DC | PRN
  Filled 2015-11-11: qty 5

## 2015-11-11 MED ORDER — FENTANYL CITRATE (PF) 250 MCG/5ML IJ SOLN
INTRAMUSCULAR | Status: AC
  Filled 2015-11-11: qty 5

## 2015-11-11 MED ORDER — FENTANYL CITRATE (PF) 100 MCG/2ML IJ SOLN
25.0000 ug | INTRAMUSCULAR | Status: DC | PRN
  Administered 2015-11-11: 25 ug via INTRAVENOUS

## 2015-11-11 MED ORDER — RIVAROXABAN 10 MG PO TABS: 10.0000 mg | ORAL_TABLET | Freq: Every day | ORAL | Status: DC

## 2015-11-11 MED ORDER — OXYCODONE HCL 5 MG PO TABS
5.0000 mg | ORAL_TABLET | ORAL | Status: DC | PRN
  Administered 2015-11-11 – 2015-11-13 (×7): 10 mg via ORAL
  Filled 2015-11-11 (×7): qty 2

## 2015-11-11 MED ORDER — ACETAMINOPHEN 500 MG PO TABS
1000.0000 mg | ORAL_TABLET | Freq: Four times a day (QID) | ORAL | Status: AC
  Administered 2015-11-11 – 2015-11-12 (×4): 1000 mg via ORAL
  Filled 2015-11-11 (×3): qty 2

## 2015-11-11 MED ORDER — DEXTROSE 5 % IV SOLN
500.0000 mg | Freq: Four times a day (QID) | INTRAVENOUS | Status: DC | PRN
  Administered 2015-11-11: 500 mg via INTRAVENOUS
  Filled 2015-11-11: qty 5
  Filled 2015-11-11: qty 550

## 2015-11-11 MED ORDER — SODIUM CHLORIDE 0.9 % IJ SOLN
INTRAMUSCULAR | Status: DC | PRN
  Administered 2015-11-11: 30 mL

## 2015-11-11 MED ORDER — ONDANSETRON HCL 4 MG/2ML IJ SOLN: 4.0000 mg | Freq: Four times a day (QID) | INTRAMUSCULAR | Status: DC | PRN

## 2015-11-11 MED ORDER — TRAMADOL HCL 50 MG PO TABS: 50.0000 mg | ORAL_TABLET | Freq: Four times a day (QID) | ORAL | Status: DC | PRN

## 2015-11-11 MED ORDER — DOCUSATE SODIUM 100 MG PO CAPS
100.0000 mg | ORAL_CAPSULE | Freq: Two times a day (BID) | ORAL | Status: DC
  Administered 2015-11-11 – 2015-11-13 (×4): 100 mg via ORAL
  Filled 2015-11-11 (×4): qty 1

## 2015-11-11 MED ORDER — SODIUM CHLORIDE 0.9 % IJ SOLN
INTRAMUSCULAR | Status: AC
  Filled 2015-11-11: qty 50

## 2015-11-11 MED ORDER — ONDANSETRON HCL 4 MG PO TABS: 4.0000 mg | ORAL_TABLET | Freq: Four times a day (QID) | ORAL | Status: DC | PRN

## 2015-11-11 MED ORDER — DEXAMETHASONE SODIUM PHOSPHATE 10 MG/ML IJ SOLN
10.0000 mg | Freq: Once | INTRAMUSCULAR | Status: AC
  Administered 2015-11-11: 10 mg via INTRAVENOUS

## 2015-11-11 MED ORDER — MORPHINE SULFATE (PF) 2 MG/ML IV SOLN
1.0000 mg | INTRAVENOUS | Status: DC | PRN
  Administered 2015-11-11 – 2015-11-12 (×3): 1 mg via INTRAVENOUS
  Filled 2015-11-11 (×3): qty 1

## 2015-11-11 MED ORDER — PROPOFOL 500 MG/50ML IV EMUL
INTRAVENOUS | Status: DC | PRN
  Administered 2015-11-11: 50 ug/kg/min via INTRAVENOUS

## 2015-11-11 MED ORDER — FENTANYL CITRATE (PF) 100 MCG/2ML IJ SOLN
INTRAMUSCULAR | Status: DC | PRN
  Administered 2015-11-11: 100 ug via INTRAVENOUS

## 2015-11-11 MED ORDER — OXYCODONE HCL 5 MG PO TABS: 5.0000 mg | ORAL_TABLET | ORAL | Status: DC | PRN

## 2015-11-11 MED ORDER — POLYETHYLENE GLYCOL 3350 17 G PO PACK
17.0000 g | PACK | Freq: Every day | ORAL | Status: DC | PRN
Start: 2015-11-11 — End: 2015-11-13

## 2015-11-11 SURGICAL SUPPLY — 51 items
BAG DECANTER FOR FLEXI CONT (MISCELLANEOUS) ×2 IMPLANT
BAG SPEC THK2 15X12 ZIP CLS (MISCELLANEOUS)
BAG ZIPLOCK 12X15 (MISCELLANEOUS) IMPLANT
BANDAGE ACE 6X5 VEL STRL LF (GAUZE/BANDAGES/DRESSINGS) ×1 IMPLANT
BANDAGE ELASTIC 6 VELCRO ST LF (GAUZE/BANDAGES/DRESSINGS) ×1 IMPLANT
BLADE SAG 18X100X1.27 (BLADE) ×2 IMPLANT
BLADE SAW SGTL 11.0X1.19X90.0M (BLADE) ×2 IMPLANT
CLOTH BEACON ORANGE TIMEOUT ST (SAFETY) ×2 IMPLANT
CUFF TOURN SGL QUICK 34 (TOURNIQUET CUFF) ×2
CUFF TRNQT CYL 34X4X40X1 (TOURNIQUET CUFF) ×1 IMPLANT
DRAPE U-SHAPE 47X51 STRL (DRAPES) ×2 IMPLANT
DRSG ADAPTIC 3X8 NADH LF (GAUZE/BANDAGES/DRESSINGS) ×2 IMPLANT
DRSG PAD ABDOMINAL 8X10 ST (GAUZE/BANDAGES/DRESSINGS) ×1 IMPLANT
DURAPREP 26ML APPLICATOR (WOUND CARE) ×2 IMPLANT
ELECT REM PT RETURN 9FT ADLT (ELECTROSURGICAL) ×2
ELECTRODE REM PT RTRN 9FT ADLT (ELECTROSURGICAL) ×1 IMPLANT
EVACUATOR 1/8 PVC DRAIN (DRAIN) ×2 IMPLANT
GAUZE SPONGE 4X4 12PLY STRL (GAUZE/BANDAGES/DRESSINGS) ×2 IMPLANT
GLOVE BIO SURGEON STRL SZ7.5 (GLOVE) IMPLANT
GLOVE BIO SURGEON STRL SZ8 (GLOVE) ×2 IMPLANT
GLOVE BIOGEL PI IND STRL 8 (GLOVE) ×1 IMPLANT
GLOVE BIOGEL PI INDICATOR 8 (GLOVE) ×1
GLOVE SURG SS PI 6.5 STRL IVOR (GLOVE) IMPLANT
GOWN STRL REUS W/TWL LRG LVL3 (GOWN DISPOSABLE) ×2 IMPLANT
GOWN STRL REUS W/TWL XL LVL3 (GOWN DISPOSABLE) IMPLANT
HANDPIECE INTERPULSE COAX TIP (DISPOSABLE) ×2
IMMOBILIZER KNEE 20 (SOFTGOODS) ×1 IMPLANT
IMMOBILIZER KNEE 20 THIGH 36 (SOFTGOODS) ×1 IMPLANT
INSERT TIBIA SCORPIO FLX 11X18 (Orthopedic Implant) ×1 IMPLANT
MANIFOLD NEPTUNE II (INSTRUMENTS) ×2 IMPLANT
NS IRRIG 1000ML POUR BTL (IV SOLUTION) ×2 IMPLANT
PACK TOTAL KNEE CUSTOM (KITS) ×2 IMPLANT
PAD ABD 8X10 STRL (GAUZE/BANDAGES/DRESSINGS) ×1 IMPLANT
PADDING CAST COTTON 6X4 STRL (CAST SUPPLIES) ×3 IMPLANT
POSITIONER SURGICAL ARM (MISCELLANEOUS) ×2 IMPLANT
SET HNDPC FAN SPRY TIP SCT (DISPOSABLE) ×1 IMPLANT
STRIP CLOSURE SKIN 1/2X4 (GAUZE/BANDAGES/DRESSINGS) ×1 IMPLANT
SUCTION FRAZIER HANDLE 10FR (MISCELLANEOUS) ×1
SUCTION TUBE FRAZIER 10FR DISP (MISCELLANEOUS) IMPLANT
SUT VIC AB 2-0 CT1 27 (SUTURE) ×6
SUT VIC AB 2-0 CT1 TAPERPNT 27 (SUTURE) ×3 IMPLANT
SUT VLOC 180 0 24IN GS25 (SUTURE) ×2 IMPLANT
SWAB COLLECTION DEVICE MRSA (MISCELLANEOUS) IMPLANT
SWAB CULTURE ESWAB REG 1ML (MISCELLANEOUS) IMPLANT
SYR 50ML LL SCALE MARK (SYRINGE) ×4 IMPLANT
TOWER CARTRIDGE SMART MIX (DISPOSABLE) ×1 IMPLANT
TRAY FOLEY W/METER SILVER 14FR (SET/KITS/TRAYS/PACK) ×1 IMPLANT
TRAY FOLEY W/METER SILVER 16FR (SET/KITS/TRAYS/PACK) ×2 IMPLANT
TUBE KAMVAC SUCTION (TUBING) IMPLANT
WATER STERILE IRR 1500ML POUR (IV SOLUTION) ×2 IMPLANT
WRAP KNEE MAXI GEL POST OP (GAUZE/BANDAGES/DRESSINGS) ×1 IMPLANT

## 2015-11-11 NOTE — Anesthesia Procedure Notes (Signed)
Spinal Patient location during procedure: OR Start time: 11/11/2015 11:20 AM End time: 11/11/2015 11:26 AM Staffing Resident/CRNA: Alfonso Patten J Performed by: resident/CRNA  Preanesthetic Checklist Completed: patient identified, site marked, surgical consent, pre-op evaluation, timeout performed, IV checked, risks and benefits discussed and monitors and equipment checked Spinal Block Patient position: sitting Prep: Betadine and site prepped and draped Patient monitoring: heart rate, continuous pulse ox and blood pressure Approach: midline Location: L3-4 Injection technique: single-shot Needle Needle type: Sprotte  Needle gauge: 24 G Needle length: 10 cm Additional Notes Expiration date on kit noted and within range.  Good CSF flow noted with no heme or c/o paresthesia.   Patient tolerated well.

## 2015-11-11 NOTE — Anesthesia Preprocedure Evaluation (Addendum)
Anesthesia Evaluation  Patient identified by MRN, date of birth, ID band Patient awake    Reviewed: Allergy & Precautions, NPO status , Patient's Chart, lab work & pertinent test results  Airway Mallampati: III  TM Distance: >3 FB Neck ROM: Full    Dental  (+) Teeth Intact   Pulmonary neg pulmonary ROS,    breath sounds clear to auscultation       Cardiovascular hypertension, Pt. on medications  Rhythm:Regular Rate:Normal     Neuro/Psych negative neurological ROS     GI/Hepatic Neg liver ROS, GERD  ,  Endo/Other  negative endocrine ROS  Renal/GU negative Renal ROS     Musculoskeletal  (+) Arthritis ,   Abdominal   Peds  Hematology negative hematology ROS (+)   Anesthesia Other Findings   Reproductive/Obstetrics                            Lab Results  Component Value Date   WBC 7.5 11/03/2015   HGB 14.0 11/03/2015   HCT 41.4 11/03/2015   MCV 86.4 11/03/2015   PLT 192 11/03/2015   Lab Results  Component Value Date   CREATININE 1.00 11/03/2015   BUN 14 11/03/2015   NA 138 11/03/2015   K 4.0 11/03/2015   CL 108 11/03/2015   CO2 23 11/03/2015   Lab Results  Component Value Date   INR 1.05 11/03/2015     Anesthesia Physical Anesthesia Plan  ASA: II  Anesthesia Plan: Spinal and MAC   Post-op Pain Management:    Induction: Intravenous  Airway Management Planned: Natural Airway and Simple Face Mask  Additional Equipment:   Intra-op Plan:   Post-operative Plan: Extubation in OR  Informed Consent: I have reviewed the patients History and Physical, chart, labs and discussed the procedure including the risks, benefits and alternatives for the proposed anesthesia with the patient or authorized representative who has indicated his/her understanding and acceptance.   Dental advisory given  Plan Discussed with: CRNA  Anesthesia Plan Comments:        Anesthesia  Quick Evaluation

## 2015-11-11 NOTE — Anesthesia Postprocedure Evaluation (Signed)
Anesthesia Post Note  Patient: Isaiah Wilson  Procedure(s) Performed: Procedure(s) (LRB): LEFT KNEE POLYETHELENE REVISION (Left)  Patient location during evaluation: PACU Anesthesia Type: Spinal and MAC Level of consciousness: awake and alert Pain management: pain level controlled Vital Signs Assessment: post-procedure vital signs reviewed and stable Respiratory status: spontaneous breathing and respiratory function stable Cardiovascular status: blood pressure returned to baseline and stable Postop Assessment: spinal receding Anesthetic complications: no    Last Vitals:  Filed Vitals:   11/11/15 1636 11/11/15 1726  BP: 179/79 157/75  Pulse: 62 63  Temp: 36.5 C 36.7 C  Resp: 16 14    Last Pain:  Filed Vitals:   11/11/15 1808  PainSc: 4                  Kennieth RadFitzgerald, Makahla Kiser E

## 2015-11-11 NOTE — Op Note (Signed)
NAME:  Isaiah Wilson, Isaiah Wilson              ACCOUNT NO.:  [id number]  MEDICAL RECORD NO.:  [id number]  LOCATION:  1602                         [facility]  PHYSICIAN:  Ollen GrossFrank Reinhold Rickey, M.D.    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  PROCEDURE:  Left knee polyethylene revision.  SURGEON:  Ollen GrossFrank Aribelle Mccosh, MD  ASSISTANT:  Dimitri PedAmber Constable, PA-C  ANESTHESIA:  General.  ESTIMATED BLOOD LOSS:  Minimal.  DRAIN:  Hemovac x1.  COMPLICATIONS:  None.  TOURNIQUET TIME:  30 minutes at 300 mmHg.  CONDITION:  Stable to recovery.  BRIEF CLINICAL NOTE:  Isaiah Wilson is a 81 year old male, had a left total knee arthroplasty done elsewhere approximately 12 years ago.  He has had progressively worsening pain and instability in the knee.  The metal components appear well fixed and in excellent alignment.  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  He presents now for polyethylene versus total knee revision.  PROCEDURE IN DETAIL:  After successful administration of general anesthetic, a tourniquet was placed high on his left thigh and his left lower extremity was prepped and draped in the usual sterile fashion. Extremities wrapped in Esmarch, and tourniquet inflated to 300 mmHg.  A midline incision was made with a 10 blade through subcutaneous tissue to the level of the extensor mechanism.  A fresh blade was used to make a medial parapatellar arthrotomy.  Soft tissue on the proximal medial tibia subperiosteally elevated to the joint line with a knife into the semimembranosus bursa with a Cobb elevator.  Soft tissue laterally was elevated with  attention being paid to avoid the patellar tendon on tibial tubercle.  Patella was everted.  Knee flexed 90 degrees.  There was a tremendous amount of hypertrophic synovial fluid consistent with reaction to polyethylene wear.  This was all debrided back to normal tissue.  With the knee flexed, I then addressed the tibial polyethylene. I used an osteotome to remove it from the tibial tray.  Of note, there was significant wear of the post of the polyethylene.  The wear was mainly anterior consistent with impingement and hyperextension leading to significant wear of the anterior aspect of the post for the posterior stabilized polyethylene.  There was also some wear posteriorly.  The metallic components were then inspected.  No damage to the metal.  The interface between the metal and bone was inspected and he has excellent alignment and has very stable components.  It was felt that we would be able to effectively treat this with polyethylene revision.  He had a 12 mm polyethylene for the Scorpio PS Flex variety.  He had a size of the 11 tibia.  The next size up on a polyethylene was a 15, so we could place a 15 mm trial and he still had some varus valgus laxity in extension.  With the 18 mm trial, he had full  extension with excellent varus-valgus and anterior-posterior balance throughout full range of motion.  The 18 mm trials removed, then 18 mm permanent polyethylene component for the size 11 Scorpio was impacted into the tibial tray with locking in place.  The knee was reduced and again there was excellent stability with full extension, with excellent varus and valgus balance. There is no evidence of any hyperextension.  There was excellent anterior-posterior and varus valgus balance throughout full range of motion.  The knee was then copiously irrigated with saline solution and the joint washed with saline.  The arthrotomy was then closed over Hemovac drain with a running #1 V-Loc suture.   Tourniquet was released with total tourniquet time of 30 minutes.  The subcu was then closed with interrupted 2-0 Vicryl and the subcuticular running 4-0 Monocryl. The drains hooked to suction.  Incision cleaned and dried and Steri- Strips and a bulky sterile dressing applied.  He was placed into a knee immobilizer, awakened and transported to recovery in stable condition. Please note that prior to closure, we had injected the extensor mechanism, periosteum, the femur and subcu tissues with total of 20 mL of Exparel mixed with 30 mL of saline and also an additional 30 mL of 0.25% Marcaine was injected into the same tissues.  Note that a surgical assistant is a medical necessity for this revision procedure.  Assistant was necessary for appropriate retraction of ligaments and vital neurovascular structures and also for appropriate exposure to remove the old implant and for safe and accurate placement of the new implant.     Ollen Gross, M.D.     FA/MEDQ  D:  11/11/2015  T:  11/11/2015  Job:  [id number]

## 2015-11-11 NOTE — Interval H&P Note (Signed)
History and Physical Interval Note:  11/11/2015 10:32 AM  Isaiah RuddJames L Ayoub  has presented today for surgery, with the diagnosis of failed left total knee arthroplasty  The various methods of treatment have been discussed with the patient and family. After consideration of risks, benefits and other options for treatment, the patient has consented to  Procedure(s): LEFT KNEE POLYETHELENE VS TOTAL KNEE REVISION (Left) as a surgical intervention .  The patient's history has been reviewed, patient examined, no change in status, stable for surgery.  I have reviewed the patient's chart and labs.  Questions were answered to the patient's satisfaction.     Loanne DrillingALUISIO,Menashe Kafer V

## 2015-11-11 NOTE — Brief Op Note (Signed)
11/11/2015  12:34 PM  PATIENT:  Isaiah Wilson  81 y.o. male  PRE-OPERATIVE DIAGNOSIS:  failed left total knee arthroplasty  POST-OPERATIVE DIAGNOSIS:  failed left total knee arthroplasty  PROCEDURE:  Procedure(s): LEFT KNEE POLYETHELENE REVISION (Left)  SURGEON:  Surgeon(s) and Role:    * Ollen GrossFrank Zackaria Burkey, MD - Primary  PHYSICIAN ASSISTANT:   ASSISTANTS: Dimitri PedAmber Constable, PA-C   ANESTHESIA:   general  EBL:  Total I/O In: 1000 [I.V.:1000] Out: 250 [Urine:200; Blood:50]  BLOOD ADMINISTERED:none  DRAINS: (Medium) Hemovact drain(s) in the left knee with  Suction Open   LOCAL MEDICATIONS USED:  OTHER Exparel  COUNTS:  YES  TOURNIQUET:   Total Tourniquet Time Documented: Thigh (Left) - 30 minutes Total: Thigh (Left) - 30 minutes   DICTATION: .Other Dictation: Dictation Number [id number]  PLAN OF CARE: Admit to inpatient   PATIENT DISPOSITION:  PACU - hemodynamically stable.

## 2015-11-11 NOTE — Transfer of Care (Signed)
Immediate Anesthesia Transfer of Care Note  Patient: Isaiah RuddJames L Wilson  Procedure(s) Performed: Procedure(s): LEFT KNEE POLYETHELENE REVISION (Left)  Patient Location: PACU  Anesthesia Type:Spinal  Level of Consciousness:  sedated, patient cooperative and responds to stimulation  Airway & Oxygen Therapy:Patient Spontanous Breathing and Patient connected to face mask oxgen  Post-op Assessment:  Report given to PACU RN and Post -op Vital signs reviewed and stable  Post vital signs:  Reviewed and stable, spinal L1  Last Vitals:  Filed Vitals:   11/11/15 0920  BP: 159/81  Pulse: 60  Temp: 36.8 C  Resp: 18    Complications: No apparent anesthesia complications

## 2015-11-12 LAB — BASIC METABOLIC PANEL
Anion gap: 5 (ref 5–15)
BUN: 14 mg/dL (ref 6–20)
CHLORIDE: 108 mmol/L (ref 101–111)
CO2: 25 mmol/L (ref 22–32)
CREATININE: 0.9 mg/dL (ref 0.61–1.24)
Calcium: 8.3 mg/dL — ABNORMAL LOW (ref 8.9–10.3)
GFR calc Af Amer: >60 mL/min (ref >60)
GLUCOSE: 135 mg/dL — AB (ref 65–99)
POTASSIUM: 4.1 mmol/L (ref 3.5–5.1)
Sodium: 138 mmol/L (ref 135–145)

## 2015-11-12 LAB — CBC
HEMATOCRIT: 33.3 % — AB (ref 39.0–52.0)
Hemoglobin: 11.4 g/dL — ABNORMAL LOW (ref 13.0–17.0)
MCH: 29.5 pg (ref 26.0–34.0)
MCHC: 34.2 g/dL (ref 30.0–36.0)
MCV: 86 fL (ref 78.0–100.0)
Platelets: 190 10*3/uL (ref 150–400)
RBC: 3.87 MIL/uL — ABNORMAL LOW (ref 4.22–5.81)
RDW: 14.1 % (ref 11.5–15.5)
WBC: 16 10*3/uL — ABNORMAL HIGH (ref 4.0–10.5)

## 2015-11-12 NOTE — Care Management Note (Signed)
Case Management Note  Patient Details  Name: ROSHAD HACK MRN: [id number] Date of Birth: 12-May-1935  Subjective/Objective:                  LEFT KNEE POLYETHELENE REVISION (Left) Action/Plan: Discharge planning Expected Discharge Date:                  Expected Discharge Plan:  [place]  In-House Referral:     Discharge planning Services  CM Consult  Post Acute Care Choice:    Choice offered to:  Patient  DME Arranged:  N/A DME Agency:  NA  HH Arranged:  PT [place] Agency:  Other - See comment  Status of Service:  Completed, signed off  Medicare Important Message Given:    Date Medicare IM Given:    Medicare IM give by:    Date Additional Medicare IM Given:    Additional Medicare Important Message give by:     If discussed at [place] of Stay Meetings, dates discussed:    Additional Comments: CM met with pt in room to offer choice.  Pt's daughter works for [employer] and he chooses this agency to render HHPT.  CM called [employer] with referral.  Pt has 3n1, RW, cane at home and does not need additional DME.  No other CM needs were communicated. Dellie Catholic, RN 11/12/2015, 2:14 PM

## 2015-11-12 NOTE — Evaluation (Signed)
Physical Therapy Evaluation Patient Details Name: Isaiah RuddJames L Castile MRN: [id number] DOB: 08/29/1934 Today's Date: 11/12/2015   History of Present Illness  Pt is a 81 year old male s/p Left knee polyethylene revision  Clinical Impression  Patient is s/p above surgery resulting in functional limitations due to the deficits listed below (see PT Problem List).  Patient will benefit from skilled PT to increase their independence and safety with mobility to allow discharge to the venue listed below.  Pt mobilizing well POD #1 and plans to d/c home with spouse tomorrow.     Follow Up Recommendations Home health PT    Equipment Recommendations  None recommended by PT    Recommendations for Other Services       Precautions / Restrictions Precautions Precautions: Knee Precaution Comments: able to perform SLR Required Braces or Orthoses: Knee Immobilizer - Left Restrictions Weight Bearing Restrictions: No Other Position/Activity Restrictions: WBAT      Mobility  Bed Mobility Overal bed mobility: Modified Independent             General bed mobility comments: with HOB elevated  Transfers Overall transfer level: Needs assistance Equipment used: Rolling walker (2 wheeled) Transfers: Sit to/from Stand Sit to Stand: Min guard;From elevated surface         General transfer comment: verbal cues for hand placement  Ambulation/Gait Ambulation/Gait assistance: Min guard Ambulation Distance (Feet): 160 Feet Assistive device: Rolling walker (2 wheeled) Gait Pattern/deviations: Step-through pattern;Antalgic;Decreased stance time - left     General Gait Details: verbal cues for sequence, RW distance, posture  Stairs            Wheelchair Mobility    Modified Rankin (Stroke Patients Only)       Balance                                             Pertinent Vitals/Pain Pain Assessment: 0-10 Pain Score:  (not rated however performed activity to  tolerance) Pain Descriptors / Indicators: Sore;Aching Pain Intervention(s): Limited activity within patient's tolerance;Monitored during session;Repositioned;Ice applied    Home Living Family/patient expects to be discharged to:: Private residence Living Arrangements: Spouse/significant other   Type of Home: House Home Access: Stairs to enter   Enter[employer] CorporationEntrance Stairs-Number of Steps: 1 Home Layout: One level Home Equipment: Environmental consultantWalker - 2 wheels;Bedside commode      Prior Function Level of Independence: Independent               Hand Dominance        Extremity/Trunk Assessment               Lower Extremity Assessment: LLE deficits/detail   LLE Deficits / Details: able to perform SLR, AROM L knee flexion approx 40* supine     Communication   Communication: HOH  Cognition Arousal/Alertness: Awake/alert Behavior During Therapy: WFL for tasks assessed/performed Overall Cognitive Status: Within Functional Limits for tasks assessed                      General Comments      Exercises Total Joint Exercises Ankle Circles/Pumps: AROM;Both;10 reps Quad Sets: AROM;Both;10 reps Heel Slides: AROM;Left;10 reps Hip ABduction/ADduction: AROM;Left;10 reps Straight Leg Raises: 10 reps;AROM;Left      Assessment/Plan    PT Assessment Patient needs continued PT services  PT Diagnosis Difficulty walking;Acute pain   PT Problem  List Decreased range of motion;Decreased strength;Decreased mobility;Pain  PT Treatment Interventions Functional mobility training;Stair training;Gait training;DME instruction;Patient/family education;Therapeutic activities;Therapeutic exercise   PT Goals (Current goals can be found in the Care Plan section) Acute Rehab PT Goals PT Goal Formulation: With patient Time For Goal Achievement: 11/16/15 Potential to Achieve Goals: Good    Frequency 7X/week   Barriers to discharge        Co-evaluation               End of Session  Equipment Utilized During Treatment: Gait belt Activity Tolerance: Patient tolerated treatment well Patient left: in bed;with call bell/phone within reach;with family/visitor present           Time: 1100-1119 PT Time Calculation (min) (ACUTE ONLY): 19 min   Charges:   PT Evaluation $PT Eval Low Complexity: 1 Procedure     PT G Codes:        Addison Freimuth,KATHrine E 11/12/2015, 11:59 AM Zenovia Jarred, PT, DPT 11/12/2015 Pager: [phone]

## 2015-11-12 NOTE — Progress Notes (Signed)
Physical Therapy Treatment Note    11/12/15 1500  PT Visit Information  Last PT Received On 11/12/15  Assistance Needed +1  History of Present Illness Pt is a 81 year old male s/p Left knee polyethylene revision  Subjective Data  Subjective Pt ambulated again in hallway and progressing very well.  Ortho tech into room to apply CPM  Precautions  Precautions Knee  Precaution Comments able to perform SLR  Restrictions  Other Position/Activity Restrictions WBAT  Pain Assessment  Pain Assessment 0-10  Pain Score 5  Pain Descriptors / Indicators Aching;Sore  Pain Intervention(s) Limited activity within patient's tolerance;Monitored during session;Repositioned  Cognition  Arousal/Alertness Awake/alert  Behavior During Therapy WFL for tasks assessed/performed  Overall Cognitive Status Within Functional Limits for tasks assessed  Bed Mobility  Overal bed mobility Modified Independent  General bed mobility comments with HOB elevated  Transfers  Overall transfer level Needs assistance  Equipment used Rolling walker (2 wheeled)  Transfers Sit to/from Stand  Sit to Stand Min guard;From elevated surface  General transfer comment verbal cues for hand placement  Ambulation/Gait  Ambulation/Gait assistance Min guard  Ambulation Distance (Feet) 200 Feet  Assistive device Rolling walker (2 wheeled)  Gait Pattern/deviations Step-through pattern;Antalgic  General Gait Details verbal cues for posture and heel strike  PT - End of Session  Activity Tolerance Patient tolerated treatment well  Patient left in bed;with call bell/phone within reach;with family/visitor present  PT - Assessment/Plan  PT Plan Current plan remains appropriate  PT Frequency (ACUTE ONLY) 7X/week  Follow Up Recommendations Home health PT  PT equipment None recommended by PT  PT Goal Progression  Progress towards PT goals Progressing toward goals  PT Time Calculation  PT Start Time (ACUTE ONLY) 1419  PT Stop Time  (ACUTE ONLY) 1430  PT Time Calculation (min) (ACUTE ONLY) 11 min  PT General Charges  $$ ACUTE PT VISIT 1 Procedure  PT Treatments  $Gait Training 8-22 mins   Zenovia JarredKati Gerlene Glassburn, PT, DPT 11/12/2015 Pager: [phone]

## 2015-11-12 NOTE — Discharge Instructions (Signed)
° °Dr. Frank Aluisio °Total Joint Specialist °[facility] °[address] °, [place] [address] °[phone] ° °TOTAL KNEE REPLACEMENT POSTOPERATIVE DIRECTIONS ° °Knee Rehabilitation, Guidelines Following Surgery  °Results after knee surgery are often greatly improved when you follow the exercise, range of motion and muscle strengthening exercises prescribed by your doctor. Safety measures are also important to protect the knee from further injury. Any time any of these exercises cause you to have increased pain or swelling in your knee joint, decrease the amount until you are comfortable again and slowly increase them. If you have problems or questions, call your caregiver or physical therapist for advice.  ° °HOME CARE INSTRUCTIONS  °Remove items at home which could result in a fall. This includes throw rugs or furniture in walking pathways.  °· ICE to the affected knee every three hours for 30 minutes at a time and then as needed for pain and swelling.  Continue to use ice on the knee for pain and swelling from surgery. You may notice swelling that will progress down to the foot and ankle.  This is normal after surgery.  Elevate the leg when you are not up walking on it.   °· Continue to use the breathing machine which will help keep your temperature down.  It is common for your temperature to cycle up and down following surgery, especially at night when you are not up moving around and exerting yourself.  The breathing machine keeps your lungs expanded and your temperature down. °· Do not place pillow under knee, focus on keeping the knee straight while resting ° °DIET °You may resume your previous home diet once your are discharged from the hospital. ° °DRESSING / WOUND CARE / SHOWERING °You may start showering once you are discharged home but do not submerge the incision under water. Just pat the incision dry and apply a dry gauze dressing on daily. °Change the surgical dressing  daily and reapply a dry dressing each time. ° °ACTIVITY °Walk with your walker as instructed. °Use walker as long as suggested by your caregivers. °Avoid periods of inactivity such as sitting longer than an hour when not asleep. This helps prevent blood clots.  °You may resume a sexual relationship in one month or when given the OK by your doctor.  °You may return to work once you are cleared by your doctor.  °Do not drive a car for 6 weeks or until released by you surgeon.  °Do not drive while taking narcotics. ° °WEIGHT BEARING °Weight bearing as tolerated with assist device (walker, cane, etc) as directed, use it as long as suggested by your surgeon or therapist, typically at least 4-6 weeks. ° °POSTOPERATIVE CONSTIPATION PROTOCOL °Constipation - defined medically as fewer than three stools per week and severe constipation as less than one stool per week. ° °One of the most common issues patients have following surgery is constipation.  Even if you have a regular bowel pattern at home, your normal regimen is likely to be disrupted due to multiple reasons following surgery.  Combination of anesthesia, postoperative narcotics, change in appetite and fluid intake all can affect your bowels.  In order to avoid complications following surgery, here are some recommendations in order to help you during your recovery period. ° °Colace (docusate) - Pick up an over-the-counter form of Colace or another stool softener and take twice a day as long as you are requiring postoperative pain medications.  Take with a full glass of water   daily.  If you experience loose stools or diarrhea, hold the colace until you stool forms back up.  If your symptoms do not get better within 1 week or if they get worse, check with your doctor. ° °Dulcolax (bisacodyl) - Pick up over-the-counter and take as directed by the product packaging as needed to assist with the movement of your bowels.  Take with a full glass of water.  Use this product as  needed if not relieved by Colace only.  ° °MiraLax (polyethylene glycol) - Pick up over-the-counter to have on hand.  MiraLax is a solution that will increase the amount of water in your bowels to assist with bowel movements.  Take as directed and can mix with a glass of water, juice, soda, coffee, or tea.  Take if you go more than two days without a movement. °Do not use MiraLax more than once per day. Call your doctor if you are still constipated or irregular after using this medication for 7 days in a row. ° °If you continue to have problems with postoperative constipation, please contact the office for further assistance and recommendations.  If you experience "the worst abdominal pain ever" or develop nausea or vomiting, please contact the office immediatly for further recommendations for treatment. ° °ITCHING ° If you experience itching with your medications, try taking only a single pain pill, or even half a pain pill at a time.  You can also use Benadryl over the counter for itching or also to help with sleep.  ° °TED HOSE STOCKINGS °Wear the elastic stockings on both legs for three weeks following surgery during the day but you may remove then at night for sleeping. ° °MEDICATIONS °See your medication summary on the “After Visit Summary” that the nursing staff will review with you prior to discharge.  You may have some home medications which will be placed on hold until you complete the course of blood thinner medication.  It is important for you to complete the blood thinner medication as prescribed by your surgeon.  Continue your approved medications as instructed at time of discharge. ° °PRECAUTIONS °If you experience chest pain or shortness of breath - call 911 immediately for transfer to the hospital emergency department.  °If you develop a fever greater that 101 F, purulent drainage from wound, increased redness or drainage from wound, foul odor from the wound/dressing, or calf pain - CONTACT YOUR  SURGEON.   °                                                °FOLLOW-UP APPOINTMENTS °Make sure you keep all of your appointments after your operation with your surgeon and caregivers. You should call the office at the above phone number and make an appointment for approximately two weeks after the date of your surgery or on the date instructed by your surgeon outlined in the "After Visit Summary". ° ° °RANGE OF MOTION AND STRENGTHENING EXERCISES  °Rehabilitation of the knee is important following a knee injury or an operation. After just a few days of immobilization, the muscles of the thigh which control the knee become weakened and shrink (atrophy). Knee exercises are designed to build up the tone and strength of the thigh muscles and to improve knee motion. Often times heat used for twenty to thirty minutes before working out will loosen   up your tissues and help with improving the range of motion but do not use heat for the first two weeks following surgery. These exercises can be done on a training (exercise) mat, on the floor, on a table or on a bed. Use what ever works the best and is most comfortable for you Knee exercises include:  °Leg Lifts - While your knee is still immobilized in a splint or cast, you can do straight leg raises. Lift the leg to 60 degrees, hold for 3 sec, and slowly lower the leg. Repeat 10-20 times 2-3 times daily. Perform this exercise against resistance later as your knee gets better.  °Quad and Hamstring Sets - Tighten up the muscle on the front of the thigh (Quad) and hold for 5-10 sec. Repeat this 10-20 times hourly. Hamstring sets are done by pushing the foot backward against an object and holding for 5-10 sec. Repeat as with quad sets.  °· Leg Slides: Lying on your back, slowly slide your foot toward your buttocks, bending your knee up off the floor (only go as far as is comfortable). Then slowly slide your foot back down until your leg is flat on the floor again. °· Angel Wings:  Lying on your back spread your legs to the side as far apart as you can without causing discomfort.  °A rehabilitation program following serious knee injuries can speed recovery and prevent re-injury in the future due to weakened muscles. Contact your doctor or a physical therapist for more information on knee rehabilitation.  ° °IF YOU ARE TRANSFERRED TO A SKILLED REHAB FACILITY °If the patient is transferred to a skilled rehab facility following release from the hospital, a list of the current medications will be sent to the facility for the patient to continue.  When discharged from the skilled rehab facility, please have the facility set up the patient's Home Health Physical Therapy prior to being released. Also, the skilled facility will be responsible for providing the patient with their medications at time of release from the facility to include their pain medication, the muscle relaxants, and their blood thinner medication. If the patient is still at the rehab facility at time of the two week follow up appointment, the skilled rehab facility will also need to assist the patient in arranging follow up appointment in our office and any transportation needs. ° °MAKE SURE YOU:  °Understand these instructions.  °Get help right away if you are not doing well or get worse.  ° ° °Pick up stool softner and laxative for home use following surgery while on pain medications. °Do not submerge incision under water. °Please use good hand washing techniques while changing dressing each day. °May shower starting three days after surgery. °Please use a clean towel to pat the incision dry following showers. °Continue to use ice for pain and swelling after surgery. °Do not use any lotions or creams on the incision until instructed by your surgeon. ° °Information on my medicine - XARELTO® (Rivaroxaban) ° °This medication education was reviewed with me or my healthcare representative as part of my discharge preparation.  The  pharmacist that spoke with me during my hospital stay was:  Remberto Lienhard M Syrus Nakama, Stu-PharmD ° °Why was Xarelto® prescribed for you? °Xarelto® was prescribed for you to reduce the risk of blood clots forming after orthopedic surgery. The medical term for these abnormal blood clots is venous thromboembolism (VTE). ° °What do you need to know about xarelto® ? °Take your Xarelto® ONCE DAILY at   the same time every day. °You may take it either with or without food. ° °If you have difficulty swallowing the tablet whole, you may crush it and mix in applesauce just prior to taking your dose. ° °Take Xarelto® exactly as prescribed by your doctor and DO NOT stop taking Xarelto® without talking to the doctor who prescribed the medication.  Stopping without other VTE prevention medication to take the place of Xarelto® may increase your risk of developing a clot. ° °After discharge, you should have regular check-up appointments with your healthcare provider that is prescribing your Xarelto®.   ° °What do you do if you miss a dose? °If you miss a dose, take it as soon as you remember on the same day then continue your regularly scheduled once daily regimen the next day. Do not take two doses of Xarelto® on the same day.  ° °Important Safety Information °A possible side effect of Xarelto® is bleeding. You should call your healthcare provider right away if you experience any of the following: °? Bleeding from an injury or your nose that does not stop. °? Unusual colored urine (red or dark brown) or unusual colored stools (red or black). °? Unusual bruising for unknown reasons. °? A serious fall or if you hit your head (even if there is no bleeding). ° °Some medicines may interact with Xarelto® and might increase your risk of bleeding while on Xarelto®. To help avoid this, consult your healthcare provider or pharmacist prior to using any new prescription or non-prescription medications, including herbals, vitamins, non-steroidal  anti-inflammatory drugs (NSAIDs) and supplements. ° °This website has more information on Xarelto®: www.xarelto.com. ° ° °

## 2015-11-12 NOTE — Progress Notes (Signed)
   Subjective: 1 Day Post-Op Procedure(s) (LRB): LEFT KNEE POLYETHELENE REVISION (Left) Patient reports pain as mild.   Patient seen in rounds with Dr. Lequita HaltAluisio. Patient is well, and has had no acute complaints or problems other than some discomfort in the left knee. No SOB or chest pain.  We will start therapy today.  Plan is to go Home after hospital stay.  Objective: Vital signs in last 24 hours: Temp:  [97.7 F (36.5 C)-98.6 F (37 C)] 98.3 F (36.8 C) (06/15 0532) Pulse Rate:  [50-67] 60 (06/15 0532) Resp:  [12-18] 16 (06/15 0532) BP: (112-181)/(54-88) 112/54 mmHg (06/15 0532) SpO2:  [97 %-100 %] 97 % (06/15 0532) Weight:  [111.131 kg (245 lb)] 111.131 kg (245 lb) (06/14 1530)  Intake/Output from previous day:  Intake/Output Summary (Last 24 hours) at 11/12/15 0740 Last data filed at 11/12/15 0533  Gross per 24 hour  Intake   2720 ml  Output   3000 ml  Net   -280 ml     Labs:  Recent Labs  11/12/15 0414  HGB 11.4*    Recent Labs  11/12/15 0414  WBC 16.0*  RBC 3.87*  HCT 33.3*  PLT 190    Recent Labs  11/12/15 0414  NA 138  K 4.1  CL 108  CO2 25  BUN 14  CREATININE 0.90  GLUCOSE 135*  CALCIUM 8.3*   EXAM General - Patient is Alert and Oriented Extremity - Neurologically intact Intact pulses distally Dorsiflexion/Plantar flexion intact No cellulitis present Compartment soft Dressing - dressing C/D/I Motor Function - intact, moving foot and toes well on exam.  Hemovac pulled without difficulty.  Past Medical History  Diagnosis Date  . Allergic rhinitis   . Hyperlipidemia   . Overweight(278.02)   . GERD (gastroesophageal reflux disease)   . Barrett's esophagus   . Colitis, ischemic (HCC)     X 2  . Benign prostatic hypertrophy   . Neuropathy (HCC)     LEFT HAND NUMB AND BURNS ALL THE TIME  . Allergy   . Hypertension     PT DENIES HTN SAYS CARDURA FOR BLADDER  . DJD (degenerative joint disease)     SPINE, AND OA  . Complication  of anesthesia     SLOW TO AWAKEN AFTER 1 SURGERY 20  YRS AGO  . HOH (hard of hearing)     BOTH EARS    Assessment/Plan: 1 Day Post-Op Procedure(s) (LRB): LEFT KNEE POLYETHELENE REVISION (Left) Principal Problem:   Failed total knee arthroplasty (HCC) Active Problems:   Failed total knee, left (HCC)  Estimated body mass index is 34.19 kg/(m^2) as calculated from the following:   Height as of this encounter: 5\' 11"  (1.803 m).   Weight as of this encounter: 111.131 kg (245 lb). Advance diet Up with therapy D/C IV fluids when tolerating POs well  DVT Prophylaxis - Xarelto Weight-Bearing as tolerated D/C O2 and Pulse OX and try on Room Air  He is doing well this morning. Will start therapy this morning. Plan for DC home tomorrow.   Dimitri PedAmber Guilianna Mckoy, PA-C Orthopaedic Surgery 11/12/2015, 7:40 AM

## 2015-11-12 NOTE — Progress Notes (Signed)
OT Cancellation Note  Patient Details Name: Kyung RuddJames L Raboin MRN: [id number] DOB: 02/09/1935   Cancelled Treatment:    Reason Eval/Treat Not Completed: OT screened, no needs identified, will sign off  Priyanka Causey, Metro KungLorraine D 11/12/2015, 3:46 PM

## 2015-11-13 LAB — CBC
HEMATOCRIT: 29.1 % — AB (ref 39.0–52.0)
HEMOGLOBIN: 10.3 g/dL — AB (ref 13.0–17.0)
MCH: 30.6 pg (ref 26.0–34.0)
MCHC: 35.4 g/dL (ref 30.0–36.0)
MCV: 86.4 fL (ref 78.0–100.0)
Platelets: 160 10*3/uL (ref 150–400)
RBC: 3.37 MIL/uL — ABNORMAL LOW (ref 4.22–5.81)
RDW: 14.4 % (ref 11.5–15.5)
WBC: 12.1 10*3/uL — ABNORMAL HIGH (ref 4.0–10.5)

## 2015-11-13 LAB — BASIC METABOLIC PANEL
Anion gap: 4 — ABNORMAL LOW (ref 5–15)
BUN: 16 mg/dL (ref 6–20)
CHLORIDE: 108 mmol/L (ref 101–111)
CO2: 27 mmol/L (ref 22–32)
CREATININE: 0.98 mg/dL (ref 0.61–1.24)
Calcium: 8.3 mg/dL — ABNORMAL LOW (ref 8.9–10.3)
GFR calc Af Amer: >60 mL/min (ref >60)
GFR calc non Af Amer: >60 mL/min (ref >60)
Glucose, Bld: 124 mg/dL — ABNORMAL HIGH (ref 65–99)
Potassium: 3.9 mmol/L (ref 3.5–5.1)
Sodium: 139 mmol/L (ref 135–145)

## 2015-11-13 NOTE — Progress Notes (Signed)
Physical Therapy Treatment Patient Details Name: Isaiah RuddJames L Wilson MRN: [id number] DOB: 10/09/1934 Today's Date: 11/13/2015    History of Present Illness Pt is a 81 year old male s/p Left knee polyethylene revision    PT Comments    Pt ambulated in hallway and practiced safe step.  Pt reports more soreness and pain today so agreeable to perform his exercises once settled at home.  HEP handout provided and pt and spouse had no further questions.  Pt feels ready for d/c home today   Follow Up Recommendations  Home health PT     Equipment Recommendations  None recommended by PT    Recommendations for Other Services       Precautions / Restrictions Precautions Precautions: Knee Precaution Comments: able to perform SLR Restrictions Other Position/Activity Restrictions: WBAT    Mobility  Bed Mobility Overal bed mobility: Needs Assistance Bed Mobility: Supine to Sit     Supine to sit: Min assist     General bed mobility comments: with HOB elevated, assist today for L LE due to soreness/pain  Transfers Overall transfer level: Needs assistance Equipment used: Rolling walker (2 wheeled) Transfers: Sit to/from Stand Sit to Stand: Min guard;From elevated surface         General transfer comment: verbal cues for hand placement  Ambulation/Gait Ambulation/Gait assistance: Min guard Ambulation Distance (Feet): 100 Feet Assistive device: Rolling walker (2 wheeled) Gait Pattern/deviations: Antalgic;Step-through pattern;Decreased stance time - left     General Gait Details: verbal cues for posture and heel strike   Stairs Stairs: Yes Stairs assistance: Min guard Stair Management: Step to pattern;Forwards;With walker Number of Stairs: 1 General stair comments: verbal cues for sequence and safety, pt reports understanding and performed min/guard for safety  Wheelchair Mobility    Modified Rankin (Stroke Patients Only)       Balance                                     Cognition Arousal/Alertness: Awake/alert Behavior During Therapy: WFL for tasks assessed/performed Overall Cognitive Status: Within Functional Limits for tasks assessed                      Exercises      General Comments        Pertinent Vitals/Pain Pain Assessment: 0-10 Pain Score: 5  Pain Location: L knee Pain Descriptors / Indicators: Aching;Sore Pain Intervention(s): Limited activity within patient's tolerance;Monitored during session;Repositioned;Ice applied    Home Living                      Prior Function            PT Goals (current goals can now be found in the care plan section) Progress towards PT goals: Progressing toward goals    Frequency  7X/week    PT Plan Current plan remains appropriate    Co-evaluation             End of Session   Activity Tolerance: Patient limited by pain Patient left: in bed;with call bell/phone within reach;with family/visitor present     Time: [id number] PT Time Calculation (min) (ACUTE ONLY): 14 min  Charges:  $Gait Training: 8-22 mins                    G Codes:      Mignonne Afonso,KATHrine E 11/13/2015, 12:01 PM Thomasene MohairKati  Cyndie Chime, PT, DPT 11/13/2015 Pager: [phone]

## 2015-11-13 NOTE — Discharge Summary (Signed)
Physician Discharge Summary   Patient ID: Isaiah Wilson MRN: [id number] DOB/AGE: 12-16-1934 81 y.o.  Admit date: 11/11/2015 Discharge date: 11/13/2015  Primary Diagnosis: Unstable left total knee arthroplasty   Admission Diagnoses:  Past Medical History  Diagnosis Date  . Allergic rhinitis   . Hyperlipidemia   . Overweight(278.02)   . GERD (gastroesophageal reflux disease)   . Barrett's esophagus   . Colitis, ischemic ([place])     X 2  . Benign prostatic hypertrophy   . Neuropathy ([facility])     LEFT HAND NUMB AND BURNS ALL THE TIME  . Allergy   . Hypertension     PT DENIES HTN SAYS CARDURA FOR BLADDER  . DJD (degenerative joint disease)     SPINE, AND OA  . Complication of anesthesia     SLOW TO AWAKEN AFTER 1 SURGERY 20  YRS AGO  . HOH (hard of hearing)     BOTH EARS   Discharge Diagnoses:   Principal Problem:   Failed total knee arthroplasty ([place]) Active Problems:   Failed total knee, left ([place])  Estimated body mass index is 34.19 kg/(m^2) as calculated from the following:   Height as of this encounter: 5' 11"  (1.803 m).   Weight as of this encounter: 111.131 kg (245 lb).  Procedure:  Procedure(s) (LRB): LEFT KNEE POLYETHELENE REVISION (Left)   Consults: None  HPI: Isaiah Wilson, 81 y.o. male, has a history of pain and functional disability in the left knee(s) due to arthritis and failed previous arthroplasty and patient has failed non-surgical conservative treatments for greater than 12 weeks to include NSAID's and/or analgesics, flexibility and strengthening excercises, supervised PT with diminished ADL's post treatment, use of assistive devices and activity modification. The indications for the revision of the total knee arthroplasty are bearing surface wear leading to symptomatic synovitis and tibiofemoral instability. Onset of symptoms was gradual starting >10 years ago with gradually worsening course since that time. Prior procedures on the left knee(s) include  arthroplasty. Patient currently rates pain in the left knee(s) at 6 out of 10 with activity. There is night pain, worsening of pain with activity and weight bearing, pain that interferes with activities of daily living, pain with passive range of motion and joint swelling. 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 This condition presents safety issues increasing the risk of falls. There is no current active infection.  Laboratory Data: Admission on 11/11/2015  Component Date Value Ref Range Status  . WBC 11/12/2015 16.0* 4.0 - 10.5 K/uL Final  . RBC 11/12/2015 3.87* 4.22 - 5.81 MIL/uL Final  . Hemoglobin 11/12/2015 11.4* 13.0 - 17.0 g/dL Final  . HCT 11/12/2015 33.3* 39.0 - 52.0 % Final  . MCV 11/12/2015 86.0  78.0 - 100.0 fL Final  . MCH 11/12/2015 29.5  26.0 - 34.0 pg Final  . MCHC 11/12/2015 34.2  30.0 - 36.0 g/dL Final  . RDW 11/12/2015 14.1  11.5 - 15.5 % Final  . Platelets 11/12/2015 190  150 - 400 K/uL Final  . Sodium 11/12/2015 138  135 - 145 mmol/L Final  . Potassium 11/12/2015 4.1  3.5 - 5.1 mmol/L Final  . Chloride 11/12/2015 108  101 - 111 mmol/L Final  . CO2 11/12/2015 25  22 - 32 mmol/L Final  . Glucose, Bld 11/12/2015 135* 65 - 99 mg/dL Final  . BUN 11/12/2015 14  6 - 20 mg/dL Final  . Creatinine, Ser 11/12/2015 0.90  0.61 - 1.24 mg/dL  Final  . Calcium 11/12/2015 8.3* 8.9 - 10.3 mg/dL Final  . GFR calc non Af Amer 11/12/2015 >60  >60 mL/min Final  . GFR calc Af Amer 11/12/2015 >60  >60 mL/min Final   Comment: (NOTE) The eGFR has been calculated using the CKD EPI equation. This calculation has not been validated in all clinical situations. eGFR's persistently <60 mL/min signify possible Chronic Kidney Disease.   . Anion gap 11/12/2015 5  5 - 15 Final  . WBC 11/13/2015 12.1* 4.0 - 10.5 K/uL Final  . RBC 11/13/2015 3.37* 4.22 - 5.81 MIL/uL Final  . Hemoglobin 11/13/2015 10.3* 13.0 - 17.0 g/dL Final  . HCT 11/13/2015 29.1* 39.0  - 52.0 % Final  . MCV 11/13/2015 86.4  78.0 - 100.0 fL Final  . MCH 11/13/2015 30.6  26.0 - 34.0 pg Final  . MCHC 11/13/2015 35.4  30.0 - 36.0 g/dL Final  . RDW 11/13/2015 14.4  11.5 - 15.5 % Final  . Platelets 11/13/2015 160  150 - 400 K/uL Final  . Sodium 11/13/2015 139  135 - 145 mmol/L Final  . Potassium 11/13/2015 3.9  3.5 - 5.1 mmol/L Final  . Chloride 11/13/2015 108  101 - 111 mmol/L Final  . CO2 11/13/2015 27  22 - 32 mmol/L Final  . Glucose, Bld 11/13/2015 124* 65 - 99 mg/dL Final  . BUN 11/13/2015 16  6 - 20 mg/dL Final  . Creatinine, Ser 11/13/2015 0.98  0.61 - 1.24 mg/dL Final  . Calcium 11/13/2015 8.3* 8.9 - 10.3 mg/dL Final  . GFR calc non Af Amer 11/13/2015 >60  >60 mL/min Final  . GFR calc Af Amer 11/13/2015 >60  >60 mL/min Final   Comment: (NOTE) The eGFR has been calculated using the CKD EPI equation. This calculation has not been validated in all clinical situations. eGFR's persistently <60 mL/min signify possible Chronic Kidney Disease.   Georgiann Hahn gap 11/13/2015 4* 5 - 15 Final  Hospital Outpatient Visit on 11/03/2015  Component Date Value Ref Range Status  . Color, Urine 11/03/2015 YELLOW  YELLOW Final  . APPearance 11/03/2015 CLEAR  CLEAR Final  . Specific Gravity, Urine 11/03/2015 1.021  1.005 - 1.030 Final  . pH 11/03/2015 5.5  5.0 - 8.0 Final  . Glucose, UA 11/03/2015 NEGATIVE  NEGATIVE mg/dL Final  . Hgb urine dipstick 11/03/2015 NEGATIVE  NEGATIVE Final  . Bilirubin Urine 11/03/2015 NEGATIVE  NEGATIVE Final  . Ketones, ur 11/03/2015 NEGATIVE  NEGATIVE mg/dL Final  . Protein, ur 11/03/2015 NEGATIVE  NEGATIVE mg/dL Final  . Nitrite 11/03/2015 NEGATIVE  NEGATIVE Final  . Leukocytes, UA 11/03/2015 NEGATIVE  NEGATIVE Final   MICROSCOPIC NOT DONE ON URINES WITH NEGATIVE PROTEIN, BLOOD, LEUKOCYTES, NITRITE, OR GLUCOSE <1000 mg/dL.  Marland Kitchen aPTT 11/03/2015 31  24 - 37 seconds Final  . WBC 11/03/2015 7.5  4.0 - 10.5 K/uL Final  . RBC 11/03/2015 4.79  4.22 - 5.81  MIL/uL Final  . Hemoglobin 11/03/2015 14.0  13.0 - 17.0 g/dL Final  . HCT 11/03/2015 41.4  39.0 - 52.0 % Final  . MCV 11/03/2015 86.4  78.0 - 100.0 fL Final  . MCH 11/03/2015 29.2  26.0 - 34.0 pg Final  . MCHC 11/03/2015 33.8  30.0 - 36.0 g/dL Final  . RDW 11/03/2015 14.0  11.5 - 15.5 % Final  . Platelets 11/03/2015 192  150 - 400 K/uL Final  . Sodium 11/03/2015 138  135 - 145 mmol/L Final  . Potassium 11/03/2015 4.0  3.5 - 5.1  mmol/L Final  . Chloride 11/03/2015 108  101 - 111 mmol/L Final  . CO2 11/03/2015 23  22 - 32 mmol/L Final  . Glucose, Bld 11/03/2015 103* 65 - 99 mg/dL Final  . BUN 11/03/2015 14  6 - 20 mg/dL Final  . Creatinine, Ser 11/03/2015 1.00  0.61 - 1.24 mg/dL Final  . Calcium 11/03/2015 9.1  8.9 - 10.3 mg/dL Final  . Total Protein 11/03/2015 7.5  6.5 - 8.1 g/dL Final  . Albumin 11/03/2015 4.5  3.5 - 5.0 g/dL Final  . AST 11/03/2015 35  15 - 41 U/L Final  . ALT 11/03/2015 36  17 - 63 U/L Final  . Alkaline Phosphatase 11/03/2015 57  38 - 126 U/L Final  . Total Bilirubin 11/03/2015 0.7  0.3 - 1.2 mg/dL Final  . GFR calc non Af Amer 11/03/2015 >60  >60 mL/min Final  . GFR calc Af Amer 11/03/2015 >60  >60 mL/min Final   Comment: (NOTE) The eGFR has been calculated using the CKD EPI equation. This calculation has not been validated in all clinical situations. eGFR's persistently <60 mL/min signify possible Chronic Kidney Disease.   . Anion gap 11/03/2015 7  5 - 15 Final  . Prothrombin Time 11/03/2015 13.9  11.6 - 15.2 seconds Final  . INR 11/03/2015 1.05  0.00 - 1.49 Final  . ABO/RH(D) 11/03/2015 A POS   Final  . Antibody Screen 11/03/2015 NEG   Final  . Sample Expiration 11/03/2015 11/14/2015   Final  . Extend sample reason 11/03/2015 NO TRANSFUSIONS OR PREGNANCY IN THE PAST 3 MONTHS   Final  . MRSA, PCR 11/03/2015 NEGATIVE  NEGATIVE Final  . Staphylococcus aureus 11/03/2015 NEGATIVE  NEGATIVE Final   Comment:        The Xpert SA Assay (FDA approved for NASAL  specimens in patients over 61 years of age), is one component of a comprehensive surveillance program.  Test performance has been validated by [facility] for patients greater than or equal to 31 year old. It is not intended to diagnose infection nor to guide or monitor treatment.       EKG: Orders placed or performed in visit on 09/02/15  . EKG 12-Lead     Hospital Course: Isaiah Wilson is a 81 y.o. who was admitted to [facility]. They were brought to the operating room on 11/11/2015 and underwent Procedure(s): [place].  Patient tolerated the procedure well and was later transferred to the recovery room and then to the orthopaedic floor for postoperative care.  They were given PO and IV analgesics for pain control following their surgery.  They were given 24 hours of postoperative antibiotics of  Anti-infectives    Start     Dose/Rate Route Frequency Ordered Stop   11/11/15 1730  ceFAZolin (ANCEF) IVPB 2g/100 mL premix     2 g 200 mL/hr over 30 Minutes Intravenous Every 6 hours 11/11/15 1535 11/11/15 2351   11/11/15 0917  ceFAZolin (ANCEF) IVPB 2g/100 mL premix     2 g 200 mL/hr over 30 Minutes Intravenous On call to O.R. 11/11/15 9449 11/11/15 1127     and started on DVT prophylaxis in the form of Xarelto.   PT and OT were ordered for total joint protocol.  Discharge planning consulted to help with postop disposition and equipment needs.  Patient had a fair night on the evening of surgery.  They started to get up OOB with therapy on day one. Hemovac drain  was pulled without difficulty.  Continued to work with therapy into day two.  Dressing was changed on day two and the incision was clean and dry. The patient had progressed with therapy and meeting their goals.  Incision was healing well.  Patient was seen in rounds and was ready to go home.   Diet: Cardiac diet Activity:WBAT Follow-up:in 2 weeks Disposition - Home Discharged Condition:  stable   Discharge Instructions    Call MD / Call 911    Complete by:  As directed   If you experience chest pain or shortness of breath, CALL 911 and be transported to the hospital emergency room.  If you develope a fever above 101 F, pus (white drainage) or increased drainage or redness at the wound, or calf pain, call your surgeon's office.     Constipation Prevention    Complete by:  As directed   Drink plenty of fluids.  Prune juice may be helpful.  You may use a stool softener, such as Colace (over the counter) 100 mg twice a day.  Use MiraLax (over the counter) for constipation as needed.     Diet - low sodium heart healthy    Complete by:  As directed      Discharge instructions    Complete by:  As directed   Dr. Gaynelle Arabian Total Joint Specialist [facility] [address], [place], [place] [address] [phone]  TOTAL KNEE REPLACEMENT POSTOPERATIVE DIRECTIONS  Knee Rehabilitation, Guidelines Following Surgery  Results after knee surgery are often greatly improved when you follow the exercise, range of motion and muscle strengthening exercises prescribed by your doctor. Safety measures are also important to protect the knee from further injury. Any time any of these exercises cause you to have increased pain or swelling in your knee joint, decrease the amount until you are comfortable again and slowly increase them. If you have problems or questions, call your caregiver or physical therapist for advice.   HOME CARE INSTRUCTIONS  Remove items at home which could result in a fall. This includes throw rugs or furniture in walking pathways.  ICE to the affected knee every three hours for 30 minutes at a time and then as needed for pain and swelling.  Continue to use ice on the knee for pain and swelling from surgery. You may notice swelling that will progress down to the foot and ankle.  This is normal after surgery.  Elevate the leg when you are not up  walking on it.   Continue to use the breathing machine which will help keep your temperature down.  It is common for your temperature to cycle up and down following surgery, especially at night when you are not up moving around and exerting yourself.  The breathing machine keeps your lungs expanded and your temperature down. Do not place pillow under knee, focus on keeping the knee straight while resting  DIET You may resume your previous home diet once your are discharged from the hospital.  DRESSING / WOUND CARE / SHOWERING You may start showering once you are discharged home but do not submerge the incision under water. Just pat the incision dry and apply a dry gauze dressing on daily. Change the surgical dressing daily and reapply a dry dressing each time.  ACTIVITY Walk with your walker as instructed. Use walker as long as suggested by your caregivers. Avoid periods of inactivity such as sitting longer than an hour when not asleep. This helps prevent blood  clots.  You may resume a sexual relationship in one month or when given the OK by your doctor.  You may return to work once you are cleared by your doctor.  Do not drive a car for 6 weeks or until released by you surgeon.  Do not drive while taking narcotics.  WEIGHT BEARING Weight bearing as tolerated with assist device (walker, cane, etc) as directed, use it as long as suggested by your surgeon or therapist, typically at least 4-6 weeks.  POSTOPERATIVE CONSTIPATION PROTOCOL Constipation - defined medically as fewer than three stools per week and severe constipation as less than one stool per week.  One of the most common issues patients have following surgery is constipation.  Even if you have a regular bowel pattern at home, your normal regimen is likely to be disrupted due to multiple reasons following surgery.  Combination of anesthesia, postoperative narcotics, change in appetite and fluid intake all can affect your bowels.  In  order to avoid complications following surgery, here are some recommendations in order to help you during your recovery period.  Colace (docusate) - Pick up an over-the-counter form of Colace or another stool softener and take twice a day as long as you are requiring postoperative pain medications.  Take with a full glass of water daily.  If you experience loose stools or diarrhea, hold the colace until you stool forms back up.  If your symptoms do not get better within 1 week or if they get worse, check with your doctor.  Dulcolax (bisacodyl) - Pick up over-the-counter and take as directed by the product packaging as needed to assist with the movement of your bowels.  Take with a full glass of water.  Use this product as needed if not relieved by Colace only.   MiraLax (polyethylene glycol) - Pick up over-the-counter to have on hand.  MiraLax is a solution that will increase the amount of water in your bowels to assist with bowel movements.  Take as directed and can mix with a glass of water, juice, soda, coffee, or tea.  Take if you go more than two days without a movement. Do not use MiraLax more than once per day. Call your doctor if you are still constipated or irregular after using this medication for 7 days in a row.  If you continue to have problems with postoperative constipation, please contact the office for further assistance and recommendations.  If you experience "the worst abdominal pain ever" or develop nausea or vomiting, please contact the office immediatly for further recommendations for treatment.  ITCHING  If you experience itching with your medications, try taking only a single pain pill, or even half a pain pill at a time.  You can also use Benadryl over the counter for itching or also to help with sleep.   TED HOSE STOCKINGS Wear the elastic stockings on both legs for three weeks following surgery during the day but you may remove then at night for sleeping.  MEDICATIONS See  your medication summary on the "After Visit Summary" that the nursing staff will review with you prior to discharge.  You may have some home medications which will be placed on hold until you complete the course of blood thinner medication.  It is important for you to complete the blood thinner medication as prescribed by your surgeon.  Continue your approved medications as instructed at time of discharge.  PRECAUTIONS If you experience chest pain or shortness of breath - call 911  immediately for transfer to the hospital emergency department.  If you develop a fever greater that 101 F, purulent drainage from wound, increased redness or drainage from wound, foul odor from the wound/dressing, or calf pain - CONTACT YOUR SURGEON.                                                   FOLLOW-UP APPOINTMENTS Make sure you keep all of your appointments after your operation with your surgeon and caregivers. You should call the office at the above phone number and make an appointment for approximately two weeks after the date of your surgery or on the date instructed by your surgeon outlined in the "After Visit Summary".   RANGE OF MOTION AND STRENGTHENING EXERCISES  Rehabilitation of the knee is important following a knee injury or an operation. After just a few days of immobilization, the muscles of the thigh which control the knee become weakened and shrink (atrophy). Knee exercises are designed to build up the tone and strength of the thigh muscles and to improve knee motion. Often times heat used for twenty to thirty minutes before working out will loosen up your tissues and help with improving the range of motion but do not use heat for the first two weeks following surgery. These exercises can be done on a training (exercise) mat, on the floor, on a table or on a bed. Use what ever works the best and is most comfortable for you Knee exercises include:  Leg Lifts - While your knee is still immobilized in a  splint or cast, you can do straight leg raises. Lift the leg to 60 degrees, hold for 3 sec, and slowly lower the leg. Repeat 10-20 times 2-3 times daily. Perform this exercise against resistance later as your knee gets better.  Quad and Hamstring Sets - Tighten up the muscle on the front of the thigh (Quad) and hold for 5-10 sec. Repeat this 10-20 times hourly. Hamstring sets are done by pushing the foot backward against an object and holding for 5-10 sec. Repeat as with quad sets.  Leg Slides: Lying on your back, slowly slide your foot toward your buttocks, bending your knee up off the floor (only go as far as is comfortable). Then slowly slide your foot back down until your leg is flat on the floor again. Angel Wings: Lying on your back spread your legs to the side as far apart as you can without causing discomfort.  A rehabilitation program following serious knee injuries can speed recovery and prevent re-injury in the future due to weakened muscles. Contact your doctor or a physical therapist for more information on knee rehabilitation.   IF YOU ARE TRANSFERRED TO A SKILLED REHAB FACILITY If the patient is transferred to a skilled rehab facility following release from the hospital, a list of the current medications will be sent to the facility for the patient to continue.  When discharged from the skilled rehab facility, please have the facility set up the patient's [place] prior to being released. Also, the skilled facility will be responsible for providing the patient with their medications at time of release from the facility to include their pain medication, the muscle relaxants, and their blood thinner medication. If the patient is still at the rehab facility at time of the two week follow  up appointment, the skilled rehab facility will also need to assist the patient in arranging follow up appointment in our office and any transportation needs.  MAKE SURE YOU:  Understand  these instructions.  Get help right away if you are not doing well or get worse.    Pick up stool softner and laxative for home use following surgery while on pain medications. Do not submerge incision under water. Please use good hand washing techniques while changing dressing each day. May shower starting three days after surgery. Please use a clean towel to pat the incision dry following showers. Continue to use ice for pain and swelling after surgery. Do not use any lotions or creams on the incision until instructed by your surgeon.     Increase activity slowly as tolerated    Complete by:  As directed             Medication List    STOP taking these medications        aspirin 81 MG tablet     CENTRUM tablet     HYDROcodone-acetaminophen 5-325 MG tablet  Commonly known as:  NORCO/VICODIN     Lysine 500 MG Caps      TAKE these medications        azelastine 0.1 % nasal spray  Commonly known as:  ASTELIN  Place 2 sprays into both nostrils 2 (two) times daily. Use in each nostril as directed     doxazosin 8 MG tablet  Commonly known as:  CARDURA  TAKE ONE TABLET BY MOUTH AT BEDTIME     esomeprazole 40 MG capsule  Commonly known as:  NEXIUM  Take 1 capsule (40 mg total) by mouth daily before breakfast.     loratadine 10 MG tablet  Commonly known as:  CLARITIN  Take 10 mg by mouth daily as needed for allergies.     methocarbamol 500 MG tablet  Commonly known as:  ROBAXIN  Take 1 tablet (500 mg total) by mouth every 6 (six) hours as needed for muscle spasms.     oxyCODONE 5 MG immediate release tablet  Commonly known as:  Oxy IR/ROXICODONE  Take 1-2 tablets (5-10 mg total) by mouth every 3 (three) hours as needed for breakthrough pain.     rivaroxaban 10 MG Tabs tablet  Commonly known as:  XARELTO  Take 1 tablet (10 mg total) by mouth daily with breakfast.     traMADol 50 MG tablet  Commonly known as:  ULTRAM  Take 1-2 tablets (50-100 mg total) by mouth  every 6 (six) hours as needed for moderate pain.     TYLENOL ARTHRITIS EXT RELIEF PO  Take 2 capsules by mouth 2 (two) times daily.     UNABLE TO FIND  Med Name: allergy shots weekly           Follow-up Information    Follow up with Gearlean Alf, MD. Schedule an appointment as soon as possible for a visit on 11/24/2015.   Specialty:  Orthopedic Surgery   Contact information:   [address] [place] [address] [phone]       Follow up with [facility] .   Why:  home health physical therapy   Contact information:   [phone]      Signed: Ardeen Jourdain, PA-C Orthopaedic Surgery 11/13/2015, 8:39 AM

## 2015-11-13 NOTE — Progress Notes (Signed)
RN reviewed discharge instructions with patient and family. Patient educated about pain medications, home health PT, MD follow up appointment, blood thinner, dressing changes, showering, ice, and other medications.   All questions answered.   Paperwork and prescriptions given.   NT rolled patient down with all belongings to family car.

## 2015-11-13 NOTE — Progress Notes (Signed)
   Subjective: 2 Days Post-Op Procedure(s) (LRB): LEFT KNEE POLYETHELENE REVISION (Left) Patient reports pain as mild.   Plan is to go Home after hospital stay.  Objective: Vital signs in last 24 hours: Temp:  [97.5 F (36.4 C)-98.6 F (37 C)] 98.1 F (36.7 C) (06/16 0532) Pulse Rate:  [56-77] 61 (06/16 0532) Resp:  [14-16] 14 (06/16 0532) BP: (128-158)/(65-73) 149/73 mmHg (06/16 0532) SpO2:  [93 %-98 %] 97 % (06/16 0532)  Intake/Output from previous day:  Intake/Output Summary (Last 24 hours) at 11/13/15 0732 Last data filed at 11/13/15 0235  Gross per 24 hour  Intake    840 ml  Output    200 ml  Net    640 ml    Intake/Output this shift:    Labs:  Recent Labs  11/12/15 0414 11/13/15 0347  HGB 11.4* 10.3*    Recent Labs  11/12/15 0414 11/13/15 0347  WBC 16.0* 12.1*  RBC 3.87* 3.37*  HCT 33.3* 29.1*  PLT 190 160    Recent Labs  11/12/15 0414 11/13/15 0347  NA 138 139  K 4.1 3.9  CL 108 108  CO2 25 27  BUN 14 16  CREATININE 0.90 0.98  GLUCOSE 135* 124*  CALCIUM 8.3* 8.3*   No results for input(s): LABPT, INR in the last 72 hours.  EXAM General - Patient is Alert, Appropriate and Oriented Extremity - Neurologically intact Neurovascular intact No cellulitis present Compartment soft Dressing/Incision - clean, dry, no drainage Motor Function - intact, moving foot and toes well on exam.   Past Medical History  Diagnosis Date  . Allergic rhinitis   . Hyperlipidemia   . Overweight(278.02)   . GERD (gastroesophageal reflux disease)   . Barrett's esophagus   . Colitis, ischemic (HCC)     X 2  . Benign prostatic hypertrophy   . Neuropathy (HCC)     LEFT HAND NUMB AND BURNS ALL THE TIME  . Allergy   . Hypertension     PT DENIES HTN SAYS CARDURA FOR BLADDER  . DJD (degenerative joint disease)     SPINE, AND OA  . Complication of anesthesia     SLOW TO AWAKEN AFTER 1 SURGERY 20  YRS AGO  . HOH (hard of hearing)     BOTH EARS     Assessment/Plan: 2 Days Post-Op Procedure(s) (LRB): LEFT KNEE POLYETHELENE REVISION (Left) Principal Problem:   Failed total knee arthroplasty (HCC) Active Problems:   Failed total knee, left (HCC)   Discharge home with home health  DVT Prophylaxis - Xarelto Weight-Bearing as tolerated to left leg  Julionna Marczak V 11/13/2015, 7:32 AM

## 2015-11-18 ENCOUNTER — Ambulatory Visit: Payer: Medicare Other | Admitting: Family Medicine

## 2016-01-26 ENCOUNTER — Other Ambulatory Visit: Payer: Self-pay | Admitting: Family Medicine

## 2016-02-15 ENCOUNTER — Telehealth: Payer: Self-pay

## 2016-02-15 MED ORDER — TRAMADOL HCL 50 MG PO TABS: 50.0000 mg | ORAL_TABLET | Freq: Four times a day (QID) | ORAL | 0 refills | Status: DC | PRN

## 2016-02-15 NOTE — Telephone Encounter (Signed)
RX request Tramadol 50 mg tab  Last OV 09/02/2015 Last refill 11/11/2015 No new appt t this time Please advise

## 2016-02-15 NOTE — Telephone Encounter (Signed)
Refill once OK. 

## 2016-02-15 NOTE — Telephone Encounter (Signed)
Verbally called in for patient.  

## 2016-02-28 ENCOUNTER — Other Ambulatory Visit: Payer: Self-pay | Admitting: Family Medicine

## 2016-03-01 ENCOUNTER — Ambulatory Visit (INDEPENDENT_AMBULATORY_CARE_PROVIDER_SITE_OTHER): Payer: Medicare Other | Admitting: Family Medicine

## 2016-03-01 DIAGNOSIS — Z23 Encounter for immunization: Secondary | ICD-10-CM | POA: Diagnosis not present

## 2016-03-24 ENCOUNTER — Ambulatory Visit (INDEPENDENT_AMBULATORY_CARE_PROVIDER_SITE_OTHER): Payer: Medicare Other | Admitting: Family Medicine

## 2016-03-24 ENCOUNTER — Encounter: Payer: Self-pay | Admitting: Family Medicine

## 2016-03-24 VITALS — BP 117/70 | HR 72 | Temp 98.1°F | Ht 71.0 in | Wt 240.4 lb

## 2016-03-24 DIAGNOSIS — J069 Acute upper respiratory infection, unspecified: Secondary | ICD-10-CM

## 2016-03-24 DIAGNOSIS — R05 Cough: Secondary | ICD-10-CM

## 2016-03-24 DIAGNOSIS — R059 Cough, unspecified: Secondary | ICD-10-CM

## 2016-03-24 MED ORDER — HYDROCODONE-HOMATROPINE 5-1.5 MG/5ML PO SYRP: 5.0000 mL | ORAL_SOLUTION | Freq: Three times a day (TID) | ORAL | 0 refills | Status: DC | PRN

## 2016-03-24 NOTE — Progress Notes (Signed)
Pre visit review using our clinic review tool, if applicable. No additional management support is needed unless otherwise documented below in the visit note. 

## 2016-03-24 NOTE — Progress Notes (Signed)
Subjective:    Patient ID: EZARIAH NACE, male    DOB: [birth year], 81 y.o.   MRN: [id number]  HPI  Mr. Harps is a 81 year old male who presents today with frontal sinus pressure/pain that has been present for 2 days.  Associated symptom of sneezing, nonproductive cough, post nasal drip, and fever with Tmax of 100.4, and loose stools x 1.  Denies N/V, tooth pain, ear pain.  Denies history asthma/bronchitis.  History of allergies where he is followed by an allergist.  He is a nonsmoker.  Recent sick contact exposure. Denies recent antibiotic use. Influenza vaccine and pneumonia vaccine is UTD OTC Daytime and Nighttime cold medication which has provided minimal benefit.  Review of Systems  Constitutional: Positive for chills and fatigue.  HENT: Positive for congestion, postnasal drip, rhinorrhea, sinus pressure and sneezing.   Eyes: Negative for visual disturbance.  Respiratory: Positive for cough.   Cardiovascular: Positive for chest pain and palpitations.  Gastrointestinal: Positive for diarrhea. Negative for abdominal pain, constipation, nausea and vomiting.  Musculoskeletal: Negative for myalgias.  Skin: Negative for rash.  Neurological: Negative for dizziness, light-headedness and headaches.   Past Medical History:  Diagnosis Date  . Allergic rhinitis   . Allergy   . Barrett's esophagus   . Benign prostatic hypertrophy   . Colitis, ischemic (HCC)    X 2  . Complication of anesthesia    SLOW TO AWAKEN AFTER 1 SURGERY 20  YRS AGO  . DJD (degenerative joint disease)    SPINE, AND OA  . GERD (gastroesophageal reflux disease)   . HOH (hard of hearing)    BOTH EARS  . Hyperlipidemia   . Hypertension    PT DENIES HTN SAYS CARDURA FOR BLADDER  . Neuropathy (HCC)    LEFT HAND NUMB AND BURNS ALL THE TIME  . Overweight(278.02)      Social History   Social History  . Marital status: Married    Spouse name: N/A  . Number of children: 1  . Years of education: N/A    Occupational History  . Not on file.   Social History Main Topics  . Smoking status: Never Smoker  . Smokeless tobacco: Never Used  . Alcohol use No  . Drug use: No  . Sexual activity: Not on file   Other Topics Concern  . Not on file   Social History Narrative  . No narrative on file    Past Surgical History:  Procedure Laterality Date  . 2 neck surgeries  2005-2006   Dr. Georgia Dom cx decompression, diskectomy,fusion C6-7 allograftWITH PLATE  . APPENDECTOMY    . FOOT SURGERY     right x 3  . LAPAROSCOPIC CHOLECYSTECTOMY  1993   Dr. Luan Moore  . left total knee replacement  07/2007   Dr. Despina Hick  . right total knee replacement  06/2006   Dr. Despina Hick  . SEPTOPLASTY  1975, May 2013   x3  . TOTAL KNEE REVISION Left 11/11/2015   Procedure: LEFT KNEE POLYETHELENE REVISION;  Surgeon: Ollen Gross, MD;  Location: [facility];  Service: Orthopedics;  Laterality: Left;    Family History  Problem Relation Age of Onset  . Colon cancer Neg Hx   . Stomach cancer Neg Hx   . Rectal cancer Neg Hx   . Esophageal cancer Neg Hx     Allergies  Allergen Reactions  . Tamsulosin Other (See Comments)    pt states INTOL to Flomax - "it affects my eyes"  Current Outpatient Prescriptions on File Prior to Visit  Medication Sig Dispense Refill  . Acetaminophen (TYLENOL ARTHRITIS EXT RELIEF PO) Take 2 capsules by mouth 2 (two) times daily.     Marland Kitchen. azelastine (ASTELIN) 0.1 % nasal spray Place 2 sprays into both nostrils 2 (two) times daily. Use in each nostril as directed    . doxazosin (CARDURA) 8 MG tablet TAKE ONE TABLET BY MOUTH AT BEDTIME 30 tablet 4  . loratadine (CLARITIN) 10 MG tablet Take 10 mg by mouth daily as needed for allergies.     . methocarbamol (ROBAXIN) 500 MG tablet Take 1 tablet (500 mg total) by mouth every 6 (six) hours as needed for muscle spasms. 60 tablet 0  . NEXIUM 40 MG capsule TAKE 1 CAPSULE (40 MG TOTAL) BY MOUTH DAILY BEFORE BREAKFAST. 30 capsule 1  . oxyCODONE  (OXY IR/ROXICODONE) 5 MG immediate release tablet Take 1-2 tablets (5-10 mg total) by mouth every 3 (three) hours as needed for breakthrough pain. 80 tablet 0  . rivaroxaban (XARELTO) 10 MG TABS tablet Take 1 tablet (10 mg total) by mouth daily with breakfast. 20 tablet 0  . traMADol (ULTRAM) 50 MG tablet Take 1-2 tablets (50-100 mg total) by mouth every 6 (six) hours as needed for moderate pain. 60 tablet 0  . UNABLE TO FIND Med Name: allergy shots weekly     No current facility-administered medications on file prior to visit.     BP 117/70 (BP Location: Left Arm, Patient Position: Sitting, Cuff Size: Normal)   Pulse 72   Temp 98.1 F (36.7 C) (Oral)   Ht 5\' 11"  (1.803 m)   Wt 240 lb 6.4 oz (109 kg)   SpO2 98%   BMI 33.53 kg/m       Objective:   Physical Exam  Constitutional: He is oriented to person, place, and time. He appears well-developed and well-nourished.  HENT:  Right Ear: Tympanic membrane normal.  Left Ear: Tympanic membrane normal.  Nose: Rhinorrhea present. Right sinus exhibits maxillary sinus tenderness and frontal sinus tenderness. Left sinus exhibits maxillary sinus tenderness and frontal sinus tenderness.  Mouth/Throat: Mucous membranes are normal. No oropharyngeal exudate or posterior oropharyngeal erythema.  Right Tympanostomy tube present   Eyes: Pupils are equal, round, and reactive to light. No scleral icterus.  Neck: Neck supple.  Cardiovascular: Normal rate and regular rhythm.   Pulmonary/Chest: Effort normal and breath sounds normal. He has no wheezes. He has no rales.  Abdominal: There is no tenderness.  Lymphadenopathy:    He has cervical adenopathy.  Neurological: He is alert and oriented to person, place, and time. Coordination normal.  Skin: Skin is warm and dry. No rash noted.  Psychiatric: He has a normal mood and affect. His behavior is normal. Judgment and thought content normal.       Assessment & Plan:  1. Acute upper respiratory  infection History and exam support viral etiology of symptoms. Advised patient on supportive measures:  Get rest, drink plenty of fluids, and use tylenol as needed for pain. Follow up if fever >100, if symptoms worsen or if symptoms are not improved in 3 to 4 days. Patient verbalizes understanding.   2. Cough Mucinex if needed and hycodan for cough. Discussed with patient that this medication causes drowsiness and he should avoid driving.  - HYDROcodone-homatropine (HYCODAN) 5-1.5 MG/5ML syrup; Take 5 mLs by mouth every 8 (eight) hours as needed for cough.  Dispense: 120 mL; Refill: 0  We discussed criteria for  antibiotic therapy and the risks versus benefits of medication.   Roddie Mc, FNP-C

## 2016-03-24 NOTE — Patient Instructions (Signed)
It was a pleasure meeting you today! Please continue Claritin and you may use Mucinex if needed.   Your symptoms today are most likely caused by viral illness. Please drink plenty of water enough for your urine to be pale yellow or clear. You may use Tylenol 325 mg every 6 hours as needed. Mucinex DM can be used for cough. Follow-up for evaluation if your symptoms do not improve in 3-4 days, worsen, or you develop a fever greater than 100.   Upper Respiratory Infection, Adult Most upper respiratory infections (URIs) are a viral infection of the air passages leading to the lungs. A URI affects the nose, throat, and upper air passages. The most common type of URI is nasopharyngitis and is typically referred to as "the common cold." URIs run their course and usually go away on their own. Most of the time, a URI does not require medical attention, but sometimes a bacterial infection in the upper airways can follow a viral infection. This is called a secondary infection. Sinus and middle ear infections are common types of secondary upper respiratory infections. Bacterial pneumonia can also complicate a URI. A URI can worsen asthma and chronic obstructive pulmonary disease (COPD). Sometimes, these complications can require emergency medical care and may be life threatening.  CAUSES Almost all URIs are caused by viruses. A virus is a type of germ and can spread from one person to another.  RISKS FACTORS You may be at risk for a URI if:   You smoke.   You have chronic heart or lung disease.  You have a weakened defense (immune) system.   You are very young or very old.   You have nasal allergies or asthma.  You work in crowded or poorly ventilated areas.  You work in health care facilities or schools. SIGNS AND SYMPTOMS  Symptoms typically develop 2-3 days after you come in contact with a cold virus. Most viral URIs last 7-10 days. However, viral URIs from the influenza virus (flu virus) can  last 14-18 days and are typically more severe. Symptoms may include:   Runny or stuffy (congested) nose.   Sneezing.   Cough.   Sore throat.   Headache.   Fatigue.   Fever.   Loss of appetite.   Pain in your forehead, behind your eyes, and over your cheekbones (sinus pain).  Muscle aches.  DIAGNOSIS  Your health care provider may diagnose a URI by:  Physical exam.  Tests to check that your symptoms are not due to another condition such as:  Strep throat.  Sinusitis.  Pneumonia.  Asthma. TREATMENT  A URI goes away on its own with time. It cannot be cured with medicines, but medicines may be prescribed or recommended to relieve symptoms. Medicines may help:  Reduce your fever.  Reduce your cough.  Relieve nasal congestion. HOME CARE INSTRUCTIONS   Take medicines only as directed by your health care provider.   Gargle warm saltwater or take cough drops to comfort your throat as directed by your health care provider.  Use a warm mist humidifier or inhale steam from a shower to increase air moisture. This may make it easier to breathe.  Drink enough fluid to keep your urine clear or pale yellow.   Eat soups and other clear broths and maintain good nutrition.   Rest as needed.   Return to work when your temperature has returned to normal or as your health care provider advises. You may need to stay home longer  to avoid infecting others. You can also use a face mask and careful hand washing to prevent spread of the virus.  Increase the usage of your inhaler if you have asthma.   Do not use any tobacco products, including cigarettes, chewing tobacco, or electronic cigarettes. If you need help quitting, ask your health care provider. PREVENTION  The best way to protect yourself from getting a cold is to practice good hygiene.   Avoid oral or hand contact with people with cold symptoms.   Wash your hands often if contact occurs.  There is no  clear evidence that vitamin C, vitamin E, echinacea, or exercise reduces the chance of developing a cold. However, it is always recommended to get plenty of rest, exercise, and practice good nutrition.  SEEK MEDICAL CARE IF:   You are getting worse rather than better.   Your symptoms are not controlled by medicine.   You have chills.  You have worsening shortness of breath.  You have brown or red mucus.  You have yellow or brown nasal discharge.  You have pain in your face, especially when you bend forward.  You have a fever.  You have swollen neck glands.  You have pain while swallowing.  You have white areas in the back of your throat. SEEK IMMEDIATE MEDICAL CARE IF:   You have severe or persistent:  Headache.  Ear pain.  Sinus pain.  Chest pain.  You have chronic lung disease and any of the following:  Wheezing.  Prolonged cough.  Coughing up blood.  A change in your usual mucus.  You have a stiff neck.  You have changes in your:  Vision.  Hearing.  Thinking.  Mood. MAKE SURE YOU:   Understand these instructions.  Will watch your condition.  Will get help right away if you are not doing well or get worse.   This information is not intended to replace advice given to you by your health care provider. Make sure you discuss any questions you have with your health care provider.   Document Released: 11/09/2000 Document Revised: 09/30/2014 Document Reviewed: 08/21/2013 Elsevier Interactive Patient Education [employer]2016 Elsevier Inc.

## 2016-03-29 ENCOUNTER — Other Ambulatory Visit: Payer: Self-pay | Admitting: Family Medicine

## 2016-04-01 ENCOUNTER — Telehealth: Payer: Self-pay | Admitting: Family Medicine

## 2016-04-01 DIAGNOSIS — R05 Cough: Secondary | ICD-10-CM

## 2016-04-01 DIAGNOSIS — R059 Cough, unspecified: Secondary | ICD-10-CM

## 2016-04-01 MED ORDER — HYDROCODONE-HOMATROPINE 5-1.5 MG/5ML PO SYRP: 5.0000 mL | ORAL_SOLUTION | Freq: Three times a day (TID) | ORAL | 0 refills | Status: DC | PRN

## 2016-04-01 NOTE — Telephone Encounter (Signed)
Refill once 120 ml

## 2016-04-01 NOTE — Telephone Encounter (Signed)
° °  Pt saw Amil AmenJulia and was given the below med   HYDROcodone-homatropine (HYCODAN) 5-1.5 MG/5ML syrup  Pt said he was told if needed a refill to call back so he is asking for a refill

## 2016-04-01 NOTE — Telephone Encounter (Signed)
Pt was seen on 10/26 for URI, medication was RXed then.  Please advise on refill.

## 2016-04-01 NOTE — Telephone Encounter (Signed)
RX printed for pt to pick up. He is aware.

## 2016-07-26 ENCOUNTER — Other Ambulatory Visit: Payer: Self-pay | Admitting: Family Medicine

## 2016-09-26 ENCOUNTER — Other Ambulatory Visit (HOSPITAL_COMMUNITY): Payer: Self-pay | Admitting: Orthopedic Surgery

## 2016-09-26 DIAGNOSIS — Z96652 Presence of left artificial knee joint: Secondary | ICD-10-CM

## 2016-09-26 DIAGNOSIS — M25562 Pain in left knee: Secondary | ICD-10-CM

## 2016-10-10 ENCOUNTER — Encounter (HOSPITAL_COMMUNITY)
Admission: RE | Admit: 2016-10-10 | Discharge: 2016-10-10 | Disposition: A | Discharge status: Home / Self Care | Payer: Medicare Other | Source: Ambulatory Visit | Attending: Orthopedic Surgery | Admitting: Orthopedic Surgery

## 2016-10-10 DIAGNOSIS — M25562 Pain in left knee: Secondary | ICD-10-CM

## 2016-10-10 DIAGNOSIS — Z96652 Presence of left artificial knee joint: Secondary | ICD-10-CM

## 2016-10-10 MED ORDER — TECHNETIUM TC 99M MEDRONATE IV KIT
20.8000 | PACK | Freq: Once | INTRAVENOUS | Status: AC | PRN
  Administered 2016-10-10: 20.8 via INTRAVENOUS

## 2016-10-14 ENCOUNTER — Encounter: Payer: Self-pay | Admitting: Family Medicine

## 2016-10-14 ENCOUNTER — Ambulatory Visit (INDEPENDENT_AMBULATORY_CARE_PROVIDER_SITE_OTHER): Payer: Medicare Other | Admitting: Family Medicine

## 2016-10-14 VITALS — BP 140/60 | HR 70 | Temp 98.5°F | Wt 255.7 lb

## 2016-10-14 DIAGNOSIS — J309 Allergic rhinitis, unspecified: Secondary | ICD-10-CM

## 2016-10-14 MED ORDER — MONTELUKAST SODIUM 10 MG PO TABS: 10.0000 mg | ORAL_TABLET | Freq: Every day | ORAL | 3 refills | Status: DC

## 2016-10-14 NOTE — Progress Notes (Signed)
Subjective:     Patient ID: Kyung RuddJames L Brow, male   DOB: 01/06/1935, 82 y.o.   MRN: [id number]  HPI Patient seen as a work in today with at least one week history of worsening sneezing, nasal congestion, cough, and intermittent headache. Nasal discharge has been clear and somewhat watery. He had one temperature of 99.7 but otherwise afebrile. Cough nonproductive. He has long-standing history of allergies and takes Claritin and presented with Flonase regularly. Still had frequent nasal drip symptoms with this. He has allergies year round but especially in the spring. No dyspnea.  He had revision surgery from left knee replacement within the past year and his had some ongoing problems with pain regarding that.  Past Medical History:  Diagnosis Date  . Allergic rhinitis   . Allergy   . Barrett's esophagus   . Benign prostatic hypertrophy   . Colitis, ischemic (HCC)    X 2  . Complication of anesthesia    SLOW TO AWAKEN AFTER 1 SURGERY 20  YRS AGO  . DJD (degenerative joint disease)    SPINE, AND OA  . GERD (gastroesophageal reflux disease)   . HOH (hard of hearing)    BOTH EARS  . Hyperlipidemia   . Hypertension    PT DENIES HTN SAYS CARDURA FOR BLADDER  . Neuropathy    LEFT HAND NUMB AND BURNS ALL THE TIME  . Overweight(278.02)    Past Surgical History:  Procedure Laterality Date  . 2 neck surgeries  2005-2006   Dr. Georgia DomHirsh--ant cx decompression, diskectomy,fusion C6-7 allograftWITH PLATE  . APPENDECTOMY    . FOOT SURGERY     right x 3  . LAPAROSCOPIC CHOLECYSTECTOMY  1993   Dr. Luan MooreP. Young  . left total knee replacement  07/2007   Dr. Despina HickAlusio  . right total knee replacement  06/2006   Dr. Despina HickAlusio  . SEPTOPLASTY  1975, May 2013   x3  . TOTAL KNEE REVISION Left 11/11/2015   Procedure: LEFT KNEE POLYETHELENE REVISION;  Surgeon: Ollen GrossFrank Aluisio, MD;  Location: [facility];  Service: Orthopedics;  Laterality: Left;    reports that he has never smoked. He has never used smokeless tobacco. He  reports that he does not drink alcohol or use drugs. family history is not on file. Allergies  Allergen Reactions  . Tamsulosin Other (See Comments)    pt states INTOL to Flomax - "it affects my eyes"     Review of Systems  Constitutional: Negative for chills and fever.  HENT: Positive for congestion, rhinorrhea and sneezing. Negative for sore throat.   Respiratory: Positive for cough. Negative for shortness of breath and wheezing.   Cardiovascular: Negative for chest pain.       Objective:   Physical Exam  Constitutional: He appears well-developed and well-nourished.  HENT:  Right Ear: External ear normal.  Left Ear: External ear normal.  Mouth/Throat: Oropharynx is clear and moist.  Neck: Neck supple.  Cardiovascular: Normal rate and regular rhythm.   Pulmonary/Chest: Effort normal and breath sounds normal. No respiratory distress. He has no wheezes. He has no rales.  Lymphadenopathy:    He has no cervical adenopathy.       Assessment:     Rhinitis. Suspect allergic    Plan:     Try Singulair 10 mg once daily. We'll also continue his Claritin and nasal inhaler. Follow-up promptly for any fever or worsening symptoms  Kristian CoveyBruce W Atoya Andrew MD [facility]

## 2016-10-14 NOTE — Patient Instructions (Addendum)
WE NOW OFFER   [facility] FAST TRACK!!!  SAME DAY Appointments for ACUTE CARE  Such as: Sprains, Injuries, cuts, abrasions, rashes, muscle pain, joint pain, back pain Colds, flu, sore throats, headache, allergies, cough, fever  Ear pain, sinus and eye infections Abdominal pain, nausea, vomiting, diarrhea, upset stomach Animal/insect bites  3 Easy Ways to Schedule: Walk-In Scheduling Call in scheduling Mychart Sign-up: [email or web address]        Allergic Rhinitis Allergic rhinitis is when the mucous membranes in the nose respond to allergens. Allergens are particles in the air that cause your body to have an allergic reaction. This causes you to release allergic antibodies. Through a chain of events, these eventually cause you to release histamine into the blood stream. Although meant to protect the body, it is this release of histamine that causes your discomfort, such as frequent sneezing, congestion, and an itchy, runny nose. What are the causes? Seasonal allergic rhinitis (hay fever) is caused by pollen allergens that may come from grasses, trees, and weeds. Year-round allergic rhinitis (perennial allergic rhinitis) is caused by allergens such as house dust mites, pet dander, and mold spores. What are the signs or symptoms?  Nasal stuffiness (congestion).  Itchy, runny nose with sneezing and tearing of the eyes. How is this diagnosed? Your health care provider can help you determine the allergen or allergens that trigger your symptoms. If you and your health care provider are unable to determine the allergen, skin or blood testing may be used. Your health care provider will diagnose your condition after taking your health history and performing a physical exam. Your health care provider may assess you for other related conditions, such as asthma, pink eye, or an ear infection. How is this treated? Allergic rhinitis does not have a cure, but it can be  controlled by:  Medicines that block allergy symptoms. These may include allergy shots, nasal sprays, and oral antihistamines.  Avoiding the allergen. Hay fever may often be treated with antihistamines in pill or nasal spray forms. Antihistamines block the effects of histamine. There are over-the-counter medicines that may help with nasal congestion and swelling around the eyes. Check with your health care provider before taking or giving this medicine. If avoiding the allergen or the medicine prescribed do not work, there are many new medicines your health care provider can prescribe. Stronger medicine may be used if initial measures are ineffective. Desensitizing injections can be used if medicine and avoidance does not work. Desensitization is when a patient is given ongoing shots until the body becomes less sensitive to the allergen. Make sure you follow up with your health care provider if problems continue. Follow these instructions at home: It is not possible to completely avoid allergens, but you can reduce your symptoms by taking steps to limit your exposure to them. It helps to know exactly what you are allergic to so that you can avoid your specific triggers. Contact a health care provider if:  You have a fever.  You develop a cough that does not stop easily (persistent).  You have shortness of breath.  You start wheezing.  Symptoms interfere with normal daily activities. This information is not intended to replace advice given to you by your health care provider. Make sure you discuss any questions you have with your health care provider. Document Released: 02/08/2001 Document Revised: 01/15/2016 Document Reviewed: 01/21/2013 Elsevier Interactive Patient Education  2017 Elsevier Inc.  

## 2016-11-01 ENCOUNTER — Encounter: Payer: Self-pay | Admitting: Family Medicine

## 2016-11-01 ENCOUNTER — Encounter (HOSPITAL_COMMUNITY): Payer: Self-pay | Admitting: *Deleted

## 2016-11-02 ENCOUNTER — Encounter: Payer: Self-pay | Admitting: Internal Medicine

## 2016-11-08 ENCOUNTER — Ambulatory Visit: Payer: Self-pay | Admitting: Orthopedic Surgery

## 2016-11-08 NOTE — H&P (Signed)
Isaiah Wilson DOB: [birth year] Married / Language: English / Race: White Male Date of Admission:  12/07/2016 CC:  Loose left total knee History of Present Illness The patient is a 82 year old male who comes in  for a preoperative History and Physical. The patient is scheduled for a left total knee arthroplasty (revision) to be performed by Dr. Dione Plover. Aluisio, MD at [facility] on 12/07/2016. The patient is a 82 year old male presenting for a post-operative visit. The patient comes in today 10 months (1/2) out from left total knee arthroplasty (poly revision). The patient states that he/she is doing poorly (constant pain and swelling) at this time. The pain is under poor control at this time and describe their pain as moderate to severe. They are currently on Tylenol for their pain. The patient is currently doing home exercise program (state's outpatient PT did not help.). The patient feels that they are progressing poorly at this time. Unfortunately, his left knee is not improving at all. He still has significant discomfort with activity. His swelling in his ankle also was one that was related to the knee. Physical therapy did not provide much benefit. He just had a polyethylene revision a year ago and my concern is there might be something more going on in there. His bone scan prior to that was unremarkable, but he is getting symptoms worrisome for femoral loosening. We got another bone scan to see if there truly is loosening. Repeat bone scan showed increased uptake in all three phases consistent with loosening or infection. His most recent blood work and markers were negative for signs of infection. He had normal WBC, CRP, and ESR. It is felt that he would benefit from undergoing a revision procedure. They have been treated conservatively in the past for the above stated problem and despite conservative measures, they continue to have progressive pain and severe functional limitations and  dysfunction. They have failed non-operative management including home exercise, medications, blood work and bone scan. It is felt that they would benefit from undergoing revision of the total joint replacement. Risks and benefits of the procedure have been discussed with the patient and they elect to proceed with surgery. There are no active contraindications to surgery such as ongoing infection or rapidly progressive neurological disease.  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 mother Diabetes Mellitus  Maternal Grandmother. grandmother mothers side First Degree Relatives  reported Heart Disease  Father. father Heart disease in male family member before age 68  Heart disease in male family member before age 32  Rheumatoid Arthritis  Maternal Grandfather. grandfather mothers side Father  Deceased, Heart disease. age 68 Mother  Deceased. age 83 Siblings  Sister - Heart Disease  Social History Children  2 Current work status  retired Engineer, agricultural (Currently)  no Exercise  daily, 10 - 15 minutes, 15 - 20 minutes. Drug/Alcohol Rehab (Previously)  no Living situation  live with spouse Marital status  married Never consumed alcohol  06/11/2015: Never  consumed alcohol Alcohol use  never consumed alcohol No history of drug/alcohol rehab  Number of flights of stairs before winded   2-3 greater than 5 Illicit drug use  no Tobacco / smoke exposure  06/11/2015: no no Tobacco use  Never smoker. 06/11/2015 never smoker Pain Contract  no Under pain contract  Tatamy with family  Medication History NexIUM (40MG Capsule DR, Oral) Active. Doxazosin Mesylate (8MG Tablet, Oral) Active. Fluticasone Furoate (27.5MCG/SPRAY Suspension, Nasal) Active. Azelastine HCl (0.1% Solution, Nasal) Active. Aspirin Childrens (81MG Tablet Chewable, Oral) Active. Loratadine (10MG Tablet, Oral) Active. Centrum (Oral) Active. Tylenol Arthritis Pain (650MG Tablet ER, Oral) Active. Zinc (50MG Tablet, Oral) Active. Albuterol Sulfate HFA (108MCG/ACT Aerosol Soln, Inhalation) Active. EpiPen 2-Pak (0.3MG/0.3ML Device, Intramuscular) Active.  Past Surgical History Appendectomy  Arthroscopy of Knee  bilateral Cataract Surgery  bilateral Foot Surgery  bilateral Gallbladder Surgery  laporoscopic Neck Disc Surgery  Sinus Surgery  Straighten Nasal Septum  Tonsillectomy  Total Knee Replacement  bilateral Poly Revision Left Total Knee  Date: 2017.  Review of Systems  General Not Present- Chills, Fatigue, Fever, Memory Loss, Night Sweats, Weight Gain and Weight Loss. Skin Not Present- Eczema, Hives, Itching, Lesions and Rash. HEENT Not Present- Dentures, Double Vision, Headache, Hearing Loss, Tinnitus and Visual Loss. Neck Present- Neck Pain. Respiratory Not Present- Allergies, Chronic Cough, Coughing up blood, Shortness of breath at rest and Shortness of breath with exertion. Cardiovascular Present- Swelling. Not Present- Chest Pain, Difficulty Breathing Lying Down, Murmur, Palpitations and Racing/skipping heartbeats. Gastrointestinal Present- Diarrhea. Not Present- Abdominal Pain, Bloody Stool, Constipation, Difficulty Swallowing, Heartburn, Jaundice, Loss of appetitie, Nausea and Vomiting. Male Genitourinary Present- Urinary frequency and  Urinating at Night. Not Present- Blood in Urine, Discharge, Flank Pain, Incontinence, Painful Urination, Urgency, Urinary Retention and Weak urinary stream. Musculoskeletal Present- Joint Pain and Joint Swelling. Not Present- Back Pain, Morning Stiffness, Muscle Pain, Muscle Weakness and Spasms. Neurological Not Present- Blackout spells, Difficulty with balance, Dizziness, Paralysis, Tremor and Weakness. Psychiatric Not Present- Insomnia.  Vitals  Weight: 250 lb Height: 71in Weight was reported by patient. Height was reported by patient. Body Surface Area: 2.32 m Body Mass Index: 34.87 kg/m  Pulse: 64 (Regular)  BP: 148/78 (Sitting, Left Arm, Standard)    Physical Exam  General Mental Status -Alert, cooperative and good historian. General Appearance-pleasant, Not in acute distress. Orientation-Oriented X3. Build & Nutrition-Well nourished and Well developed.  Head and Neck Head-normocephalic, atraumatic . Neck Global Assessment - supple, no bruit auscultated on the right, no bruit auscultated on the left.  Eye Pupil - Bilateral-Regular and Round. Motion - Bilateral-EOMI.  Chest and Lung Exam Auscultation Breath sounds - clear at anterior chest wall and clear at posterior chest wall. Adventitious sounds - No Adventitious sounds.  Cardiovascular Auscultation Rhythm - Regular rate and rhythm. Heart Sounds - S1 WNL and S2 WNL. Murmurs & Other Heart Sounds - Auscultation of the heart reveals - No Murmurs.  Abdomen Inspection Contour - Generalized moderate distention. Palpation/Percussion Tenderness - Abdomen is non-tender to palpation. Rigidity (guarding) - Abdomen is soft. Auscultation Auscultation of the abdomen reveals - Bowel sounds normal.  Male Genitourinary Note: Not done, not pertinent to present illness   Musculoskeletal Note: He is in no distress. His left knee shows no effusion. His range is about 0 to 120. There is no specific  tenderness. There is no instability. He does have some edema around his ankle, 1+ pitting edema. He does have normal pulses in the foot.  RADIOGRAPHS AP lateral of the left knee shows prosthesis in good position. There is questionable lucency on the lateral view around the femoral component. Other than that, it looks pretty normal.   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 Elease Hashimoto - pending, letter sent  IV TXA  Anesthesia Issues: None except for slow to wake up one time  Patient was instructed on what medications to stop prior to surgery.  Signed electronically by Joelene Millin, III PA-C

## 2016-11-08 NOTE — H&P (Signed)
Isaiah Wilson DOB: 07/24/1934 Married / Language: English / Race: White Male Date of Admission:  12/07/2016 CC:  Loose left total knee History of Present Illness The patient is a 82 year old male who comes in  for a preoperative History and Physical. The patient is scheduled for a left total knee arthroplasty (revision) to be performed by Dr. Frank V. Aluisio, MD at [facility] on 12/07/2016. The patient is a 82 year old male presenting for a post-operative visit. The patient comes in today 10 months (1/2) out from left total knee arthroplasty (poly revision). The patient states that he/she is doing poorly (constant pain and swelling) at this time. The pain is under poor control at this time and describe their pain as moderate to severe. They are currently on Tylenol for their pain. The patient is currently doing home exercise program (state's outpatient PT did not help.). The patient feels that they are progressing poorly at this time. Unfortunately, his left knee is not improving at all. He still has significant discomfort with activity. His swelling in his ankle also was one that was related to the knee. Physical therapy did not provide much benefit. He just had a polyethylene revision a year ago and my concern is there might be something more going on in there. His bone scan prior to that was unremarkable, but he is getting symptoms worrisome for femoral loosening. We got another bone scan to see if there truly is loosening. Repeat bone scan showed increased uptake in all three phases consistent with loosening or infection. His most recent blood work and markers were negative for signs of infection. He had normal WBC, CRP, and ESR. It is felt that he would benefit from undergoing a revision procedure. They have been treated conservatively in the past for the above stated problem and despite conservative measures, they continue to have progressive pain and severe functional limitations and  dysfunction. They have failed non-operative management including home exercise, medications, blood work and bone scan. It is felt that they would benefit from undergoing revision of the total joint replacement. Risks and benefits of the procedure have been discussed with the patient and they elect to proceed with surgery. There are no active contraindications to surgery such as ongoing infection or rapidly progressive neurological disease.  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 mother Diabetes Mellitus  Maternal Grandmother. grandmother mothers side First Degree Relatives  reported Heart Disease  Father. father Heart disease in male family member before age 65  Heart disease in male family member before age 55  Rheumatoid Arthritis  Maternal Grandfather. grandfather mothers side Father  Deceased, Heart disease. age 50 Mother  Deceased. age 70 Siblings  Sister - Heart Disease  Social History Children  2 Current work status  retired Drug/Alcohol Rehab (Currently)  no Exercise  daily, 10 - 15 minutes, 15 - 20 minutes. Drug/Alcohol Rehab (Previously)  no Living situation  live with spouse Marital status  married Never consumed alcohol  06/11/2015: Never   consumed alcohol Alcohol use  never consumed alcohol No history of drug/alcohol rehab  Number of flights of stairs before winded   2-3 greater than 5 Illicit drug use  no Tobacco / smoke exposure  06/11/2015: no no Tobacco use  Never smoker. 06/11/2015 never smoker Pain Contract  no Under pain contract  Post-Surgical Plans  Home with family  Medication History NexIUM (40MG Capsule DR, Oral) Active. Doxazosin Mesylate (8MG Tablet, Oral) Active. Fluticasone Furoate (27.5MCG/SPRAY Suspension, Nasal) Active. Azelastine HCl (0.1% Solution, Nasal) Active. Aspirin Childrens (81MG Tablet Chewable, Oral) Active. Loratadine (10MG Tablet, Oral) Active. Centrum (Oral) Active. Tylenol Arthritis Pain (650MG Tablet ER, Oral) Active. Zinc (50MG Tablet, Oral) Active. Albuterol Sulfate HFA (108MCG/ACT Aerosol Soln, Inhalation) Active. EpiPen 2-Pak (0.3MG/0.3ML Device, Intramuscular) Active.  Past Surgical History Appendectomy  Arthroscopy of Knee  bilateral Cataract Surgery  bilateral Foot Surgery  bilateral Gallbladder Surgery  laporoscopic Neck Disc Surgery  Sinus Surgery  Straighten Nasal Septum  Tonsillectomy  Total Knee Replacement  bilateral Poly Revision Left Total Knee  Date: 2017.  Review of Systems  General Not Present- Chills, Fatigue, Fever, Memory Loss, Night Sweats, Weight Gain and Weight Loss. Skin Not Present- Eczema, Hives, Itching, Lesions and Rash. HEENT Not Present- Dentures, Double Vision, Headache, Hearing Loss, Tinnitus and Visual Loss. Neck Present- Neck Pain. Respiratory Not Present- Allergies, Chronic Cough, Coughing up blood, Shortness of breath at rest and Shortness of breath with exertion. Cardiovascular Present- Swelling. Not Present- Chest Pain, Difficulty Breathing Lying Down, Murmur, Palpitations and Racing/skipping heartbeats. Gastrointestinal Present- Diarrhea. Not Present- Abdominal Pain, Bloody Stool, Constipation, Difficulty Swallowing, Heartburn, Jaundice, Loss of appetitie, Nausea and Vomiting. Male Genitourinary Present- Urinary frequency and  Urinating at Night. Not Present- Blood in Urine, Discharge, Flank Pain, Incontinence, Painful Urination, Urgency, Urinary Retention and Weak urinary stream. Musculoskeletal Present- Joint Pain and Joint Swelling. Not Present- Back Pain, Morning Stiffness, Muscle Pain, Muscle Weakness and Spasms. Neurological Not Present- Blackout spells, Difficulty with balance, Dizziness, Paralysis, Tremor and Weakness. Psychiatric Not Present- Insomnia.  Vitals  Weight: 250 lb Height: 71in Weight was reported by patient. Height was reported by patient. Body Surface Area: 2.32 m Body Mass Index: 34.87 kg/m  Pulse: 64 (Regular)  BP: 148/78 (Sitting, Left Arm, Standard)    Physical Exam  General Mental Status -Alert, cooperative and good historian. General Appearance-pleasant, Not in acute distress. Orientation-Oriented X3. Build & Nutrition-Well nourished and Well developed.  Head and Neck Head-normocephalic, atraumatic . Neck Global Assessment - supple, no bruit auscultated on the right, no bruit auscultated on the left.  Eye Pupil - Bilateral-Regular and Round. Motion - Bilateral-EOMI.  Chest and Lung Exam Auscultation Breath sounds - clear at anterior chest wall and clear at posterior chest wall. Adventitious sounds - No Adventitious sounds.  Cardiovascular Auscultation Rhythm - Regular rate and rhythm. Heart Sounds - S1 WNL and S2 WNL. Murmurs & Other Heart Sounds - Auscultation of the heart reveals - No Murmurs.  Abdomen Inspection Contour - Generalized moderate distention. Palpation/Percussion Tenderness - Abdomen is non-tender to palpation. Rigidity (guarding) - Abdomen is soft. Auscultation Auscultation of the abdomen reveals - Bowel sounds normal.  Male Genitourinary Note: Not done, not pertinent to present illness   Musculoskeletal Note: He is in no distress. His left knee shows no effusion. His range is about 0 to 120. There is no specific  tenderness. There is no instability. He does have some edema around his ankle, 1+ pitting edema. He does have normal pulses in the foot.    RADIOGRAPHS AP lateral of the left knee shows prosthesis in good position. There is questionable lucency on the lateral view around the femoral component. Other than that, it looks pretty normal.   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 Burchette - pending, letter sent  IV TXA  Anesthesia Issues: None except for slow to wake up one time  Patient was instructed on what medications to stop prior to surgery.  Signed electronically by Marl Seago L Oliviana Mcgahee, III PA-C  

## 2016-11-16 ENCOUNTER — Ambulatory Visit (INDEPENDENT_AMBULATORY_CARE_PROVIDER_SITE_OTHER): Payer: Medicare Other | Admitting: Family Medicine

## 2016-11-16 ENCOUNTER — Encounter: Payer: Self-pay | Admitting: Family Medicine

## 2016-11-16 VITALS — BP 140/80 | HR 61 | Temp 97.8°F | Ht 70.0 in | Wt 256.2 lb

## 2016-11-16 DIAGNOSIS — R7989 Other specified abnormal findings of blood chemistry: Secondary | ICD-10-CM | POA: Diagnosis not present

## 2016-11-16 DIAGNOSIS — Z23 Encounter for immunization: Secondary | ICD-10-CM | POA: Diagnosis not present

## 2016-11-16 DIAGNOSIS — Z0000 Encounter for general adult medical examination without abnormal findings: Secondary | ICD-10-CM

## 2016-11-16 LAB — CBC WITH DIFFERENTIAL/PLATELET
BASOS ABS: 0.1 10*3/uL (ref 0.0–0.1)
Basophils Relative: 0.8 % (ref 0.0–3.0)
EOS ABS: 0.2 10*3/uL (ref 0.0–0.7)
Eosinophils Relative: 2.3 % (ref 0.0–5.0)
HEMATOCRIT: 41.4 % (ref 39.0–52.0)
HEMOGLOBIN: 14 g/dL (ref 13.0–17.0)
LYMPHS PCT: 27.7 % (ref 12.0–46.0)
Lymphs Abs: 2.4 10*3/uL (ref 0.7–4.0)
MCHC: 33.8 g/dL (ref 30.0–36.0)
MCV: 87.7 fl (ref 78.0–100.0)
MONOS PCT: 8.3 % (ref 3.0–12.0)
Monocytes Absolute: 0.7 10*3/uL (ref 0.1–1.0)
Neutro Abs: 5.3 10*3/uL (ref 1.4–7.7)
Neutrophils Relative %: 60.9 % (ref 43.0–77.0)
PLATELETS: 198 10*3/uL (ref 150.0–400.0)
RBC: 4.71 Mil/uL (ref 4.22–5.81)
RDW: 14.2 % (ref 11.5–15.5)
WBC: 8.8 10*3/uL (ref 4.0–10.5)

## 2016-11-16 LAB — LIPID PANEL
CHOL/HDL RATIO: 5
Cholesterol: 169 mg/dL (ref 0–200)
HDL: 34.2 mg/dL — ABNORMAL LOW (ref >39.00)
NONHDL: 135.11
Triglycerides: 211 mg/dL — ABNORMAL HIGH (ref 0.0–149.0)
VLDL: 42.2 mg/dL — AB (ref 0.0–40.0)

## 2016-11-16 LAB — BASIC METABOLIC PANEL
BUN: 15 mg/dL (ref 6–23)
CHLORIDE: 107 meq/L (ref 96–112)
CO2: 26 mEq/L (ref 19–32)
CREATININE: 1.03 mg/dL (ref 0.40–1.50)
Calcium: 9.2 mg/dL (ref 8.4–10.5)
GFR: 73.93 mL/min (ref >60.00)
GLUCOSE: 113 mg/dL — AB (ref 70–99)
POTASSIUM: 4.1 meq/L (ref 3.5–5.1)
Sodium: 142 mEq/L (ref 135–145)

## 2016-11-16 LAB — HEPATIC FUNCTION PANEL
ALK PHOS: 54 U/L (ref 39–117)
ALT: 30 U/L (ref 0–53)
AST: 29 U/L (ref 0–37)
Albumin: 4.3 g/dL (ref 3.5–5.2)
BILIRUBIN DIRECT: 0.1 mg/dL (ref 0.0–0.3)
BILIRUBIN TOTAL: 0.6 mg/dL (ref 0.2–1.2)
Total Protein: 6.7 g/dL (ref 6.0–8.3)

## 2016-11-16 LAB — TSH: TSH: 3.74 u[IU]/mL (ref 0.35–4.50)

## 2016-11-16 LAB — LDL CHOLESTEROL, DIRECT: Direct LDL: 101 mg/dL

## 2016-11-16 NOTE — Patient Instructions (Signed)
Consider new shingles vaccine (Shingrix) some time this year.

## 2016-11-16 NOTE — Progress Notes (Signed)
Subjective:     Patient ID: Isaiah Wilson, male   DOB: 08/08/1934, 82 y.o.   MRN: [id number]  HPI Patient here for physical exam. He is preparing to get revision of total knee replacement on the left next week.Marland Kitchen. His chronic problems include history of GERD, Barrett's esophagus, osteoarthritis, BPH.  He has no cardiac history. Has had previous negative stress testing. Reflux symptoms stable. BPH symptoms stable. No record of documented Pneumovax. He's had previous shingles vaccine. Has had Prevnar 13. Tetanus up-to-date. Colonoscopy up-to-date  Even with his knee difficulties has stayed fairly active. He states he spent approximately 4 hours putting out pine needles couple days ago in the middle of daily heat without difficulty  Past Medical History:  Diagnosis Date  . Allergic rhinitis   . Allergy   . Barrett's esophagus   . Benign prostatic hypertrophy   . Colitis, ischemic (HCC)    X 2  . Complication of anesthesia    SLOW TO AWAKEN AFTER 1 SURGERY 20  YRS AGO  . DJD (degenerative joint disease)    SPINE, AND OA  . GERD (gastroesophageal reflux disease)   . HOH (hard of hearing)    BOTH EARS  . Hyperlipidemia   . Hypertension    PT DENIES HTN SAYS CARDURA FOR BLADDER  . Neuropathy    LEFT HAND NUMB AND BURNS ALL THE TIME  . Overweight(278.02)    Past Surgical History:  Procedure Laterality Date  . 2 neck surgeries  2005-2006   Dr. Georgia DomHirsh--ant cx decompression, diskectomy,fusion C6-7 allograftWITH PLATE  . APPENDECTOMY    . FOOT SURGERY     right x 3  . LAPAROSCOPIC CHOLECYSTECTOMY  1993   Dr. Luan MooreP. Young  . left total knee replacement  07/2007   Dr. Despina HickAlusio  . right total knee replacement  06/2006   Dr. Despina HickAlusio  . SEPTOPLASTY  1975, May 2013   x3  . TOTAL KNEE REVISION Left 11/11/2015   Procedure: LEFT KNEE POLYETHELENE REVISION;  Surgeon: Ollen GrossFrank Aluisio, MD;  Location: [facility];  Service: Orthopedics;  Laterality: Left;    reports that he has never smoked. He has never used  smokeless tobacco. He reports that he does not drink alcohol or use drugs. family history is not on file. Allergies  Allergen Reactions  . Tamsulosin Other (See Comments)    pt states INTOL to Flomax - "it affects my eyes"     Review of Systems  Constitutional: Negative for activity change, appetite change, fatigue, fever and unexpected weight change.  HENT: Negative for congestion, ear pain and trouble swallowing.   Eyes: Negative for pain and visual disturbance.  Respiratory: Negative for cough, shortness of breath and wheezing.   Cardiovascular: Negative for chest pain and palpitations.  Gastrointestinal: Negative for abdominal distention, abdominal pain, blood in stool, constipation, diarrhea, nausea, rectal pain and vomiting.  Genitourinary: Negative for dysuria, hematuria and testicular pain.  Musculoskeletal: Positive for arthralgias and back pain. Negative for joint swelling.  Skin: Negative for rash.  Neurological: Negative for dizziness, syncope and headaches.  Hematological: Negative for adenopathy.  Psychiatric/Behavioral: Negative for confusion and dysphoric mood.       Objective:   Physical Exam  Constitutional: He is oriented to person, place, and time. He appears well-developed and well-nourished. No distress.  HENT:  Head: Normocephalic and atraumatic.  Right Ear: External ear normal.  Left Ear: External ear normal.  Mouth/Throat: Oropharynx is clear and moist.  Eyes: Conjunctivae and EOM are normal. Pupils  are equal, round, and reactive to light.  Neck: Normal range of motion. Neck supple. No thyromegaly present.  Cardiovascular: Normal rate, regular rhythm and normal heart sounds.   No murmur heard. Pulmonary/Chest: No respiratory distress. He has no wheezes. He has no rales.  Abdominal: Soft. Bowel sounds are normal. He exhibits no distension and no mass. There is no tenderness. There is no rebound and no guarding.  Musculoskeletal: He exhibits no edema.   Lymphadenopathy:    He has no cervical adenopathy.  Neurological: He is alert and oriented to person, place, and time. He displays normal reflexes. No cranial nerve deficit.  Skin: No rash noted.  Multiple keratoses on his back. No concerning lesions  Psychiatric: He has a normal mood and affect.       Assessment:     Physical exam. Patient has history osteoarthritis and planned revision of left total knee replacement next week.    Plan:     -Pneumovax given -Continue with yearly flu vaccine -Recommend consideration for new shingles vaccine. He will check on coverage. -Obtain screening lab work -Forms completed for surgery  Kristian Covey MD [facility] at [facility]

## 2016-11-21 ENCOUNTER — Telehealth: Payer: Self-pay | Admitting: Family Medicine

## 2016-11-21 NOTE — Telephone Encounter (Signed)
Patient is aware of lab results.

## 2016-11-21 NOTE — Telephone Encounter (Signed)
Pt returning your call about lab results  

## 2016-11-22 ENCOUNTER — Other Ambulatory Visit: Payer: Self-pay | Admitting: Family Medicine

## 2016-11-22 MED ORDER — DOXAZOSIN MESYLATE 8 MG PO TABS: 8.0000 mg | ORAL_TABLET | Freq: Every day | ORAL | 2 refills | Status: DC

## 2016-11-22 NOTE — Telephone Encounter (Signed)
Error

## 2016-11-25 ENCOUNTER — Ambulatory Visit: Payer: Self-pay | Admitting: Orthopedic Surgery

## 2016-11-29 ENCOUNTER — Other Ambulatory Visit (HOSPITAL_COMMUNITY): Payer: Self-pay | Admitting: *Deleted

## 2016-11-29 NOTE — Patient Instructions (Addendum)
Isaiah RuddJames L Wilson  11/29/2016   Your procedure is scheduled on: Wednesday 12-07-16  [facility]  Report to admitting at 1115 AM  Call this number if you have problems the morning of surgery  [phone]   Remember: ONLY 1 PERSON MAY GO WITH YOU TO SHORT STAY TO GET  READY MORNING OF YOUR SURGERY.  Do not eat food :After Midnight.CLEAR LIQUIDS MIDNIGHT UNTIL 745 AM, THEN NOTHING BY MOUTH AFTER 745 AM DAY OF SURGERY.     Take these medicines the morning of surgery with A SIP OF WATER:  FLONASE NASAL SPRAY                              You may not have any metal on your body including hair pins and              piercings  Do not wear jewelry, make-up, lotions, powders or perfumes, deodorant             Do not wear nail polish.  Do not shave  48 hours prior to surgery.              Men may shave face and neck.   Do not bring valuables to the hospital. [facility] IS NOT             RESPONSIBLE   FOR VALUABLES.  Contacts, dentures or bridgework may not be worn into surgery.  Leave suitcase in the car. After surgery it may be brought to your room.                  Please read over the following fact sheets you were given: _____________________________________________________________________    CLEAR LIQUID DIET   Foods Allowed                                                                     Foods Excluded  Coffee and tea, regular and decaf                             liquids that you cannot  Plain Jell-O in any flavor                                             see through such as: Fruit ices (not with fruit pulp)                                     milk, soups, orange juice  Iced Popsicles                                    All solid food Carbonated beverages, regular and diet  Cranberry, grape and apple juices Sports drinks like Gatorade Lightly seasoned clear broth or consume(fat free) Sugar,  honey syrup  Sample Menu Breakfast                                Lunch                                     Supper Cranberry juice                    Beef broth                            Chicken broth Jell-O                                     Grape juice                           Apple juice Coffee or tea                        Jell-O                                      Popsicle                                                Coffee or tea                        Coffee or tea  _____________________________________________________________________              [facility] - Preparing for Surgery Before surgery, you can play an important role.  Because skin is not sterile, your skin needs to be as free of germs as possible.  You can reduce the number of germs on your skin by washing with CHG (chlorahexidine gluconate) soap before surgery.  CHG is an antiseptic cleaner which kills germs and bonds with the skin to continue killing germs even after washing. Please DO NOT use if you have an allergy to CHG or antibacterial soaps.  If your skin becomes reddened/irritated stop using the CHG and inform your nurse when you arrive at Short Stay. Do not shave (including legs and underarms) for at least 48 hours prior to the first CHG shower.  You may shave your face/neck. Please follow these instructions carefully:  1.  Shower with CHG Soap the night before surgery and the  morning of Surgery.  2.  If you choose to wash your hair, wash your hair first as usual with your  normal  shampoo.  3.  After you shampoo, rinse your hair and body thoroughly to remove the  shampoo.                           4.  Use CHG as you would any other liquid soap.  You can apply  chg directly  to the skin and wash                       Gently with a scrungie or clean washcloth.  5.  Apply the CHG Soap to your body ONLY FROM THE NECK DOWN.   Do not use on face/ open                           Wound or open sores. Avoid contact  with eyes, ears mouth and genitals (private parts).                       Wash face,  Genitals (private parts) with your normal soap.             6.  Wash thoroughly, paying special attention to the area where your surgery  will be performed.  7.  Thoroughly rinse your body with warm water from the neck down.  8.  DO NOT shower/wash with your normal soap after using and rinsing off  the CHG Soap.                9.  Pat yourself dry with a clean towel.            10.  Wear clean pajamas.            11.  Place clean sheets on your bed the night of your first shower and do not  sleep with pets. Day of Surgery : Do not apply any lotions/deodorants the morning of surgery.  Please wear clean clothes to the hospital/surgery center.  FAILURE TO FOLLOW THESE INSTRUCTIONS MAY RESULT IN THE CANCELLATION OF YOUR SURGERY PATIENT SIGNATURE_________________________________  NURSE SIGNATURE__________________________________  ________________________________________________________________________   Isaiah Wilson  An incentive spirometer is a tool that can help keep your lungs clear and active. This tool measures how well you are filling your lungs with each breath. Taking long deep breaths may help reverse or decrease the chance of developing breathing (pulmonary) problems (especially infection) following:  A long period of time when you are unable to move or be active. BEFORE THE PROCEDURE   If the spirometer includes an indicator to show your best effort, your nurse or respiratory therapist will set it to a desired goal.  If possible, sit up straight or lean slightly forward. Try not to slouch.  Hold the incentive spirometer in an upright position. INSTRUCTIONS FOR USE  1. Sit on the edge of your bed if possible, or sit up as far as you can in bed or on a chair. 2. Hold the incentive spirometer in an upright position. 3. Breathe out normally. 4. Place the mouthpiece in your mouth and seal  your lips tightly around it. 5. Breathe in slowly and as deeply as possible, raising the piston or the ball toward the top of the column. 6. Hold your breath for 3-5 seconds or for as long as possible. Allow the piston or ball to fall to the bottom of the column. 7. Remove the mouthpiece from your mouth and breathe out normally. 8. Rest for a few seconds and repeat Steps 1 through 7 at least 10 times every 1-2 hours when you are awake. Take your time and take a few normal breaths between deep breaths. 9. The spirometer may include an indicator to show your best effort. Use the indicator as a goal to work toward during each  repetition. 10. After each set of 10 deep breaths, practice coughing to be sure your lungs are clear. If you have an incision (the cut made at the time of surgery), support your incision when coughing by placing a pillow or rolled up towels firmly against it. Once you are able to get out of bed, walk around indoors and cough well. You may stop using the incentive spirometer when instructed by your caregiver.  RISKS AND COMPLICATIONS  Take your time so you do not get dizzy or light-headed.  If you are in pain, you may need to take or ask for pain medication before doing incentive spirometry. It is harder to take a deep breath if you are having pain. AFTER USE  Rest and breathe slowly and easily.  It can be helpful to keep track of a log of your progress. Your caregiver can provide you with a simple table to help with this. If you are using the spirometer at home, follow these instructions: SEEK MEDICAL CARE IF:   You are having difficultly using the spirometer.  You have trouble using the spirometer as often as instructed.  Your pain medication is not giving enough relief while using the spirometer.  You develop fever of 100.5 F (38.1 C) or higher. SEEK IMMEDIATE MEDICAL CARE IF:   You cough up bloody sputum that had not been present before.  You develop fever of  102 F (38.9 C) or greater.  You develop worsening pain at or near the incision site. MAKE SURE YOU:   Understand these instructions.  Will watch your condition.  Will get help right away if you are not doing well or get worse. Document Released: 09/26/2006 Document Revised: 08/08/2011 Document Reviewed: 11/27/2006 Exit[employer] Patient Information 2014 ExitCare, [place].   ________________________________________________________________________  WHAT IS A BLOOD TRANSFUSION? Blood Transfusion Information  A transfusion is the replacement of blood or some of its parts. Blood is made up of multiple cells which provide different functions.  Red blood cells carry oxygen and are used for blood loss replacement.  White blood cells fight against infection.  Platelets control bleeding.  Plasma helps clot blood.  Other blood products are available for specialized needs, such as hemophilia or other clotting disorders. BEFORE THE TRANSFUSION  Who gives blood for transfusions?   Healthy volunteers who are fully evaluated to make sure their blood is safe. This is blood bank blood. Transfusion therapy is the safest it has ever been in the practice of medicine. Before blood is taken from a donor, a complete history is taken to make sure that person has no history of diseases nor engages in risky social behavior (examples are intravenous drug use or sexual activity with multiple partners). The donor's travel history is screened to minimize risk of transmitting infections, such as malaria. The donated blood is tested for signs of infectious diseases, such as HIV and hepatitis. The blood is then tested to be sure it is compatible with you in order to minimize the chance of a transfusion reaction. If you or a relative donates blood, this is often done in anticipation of surgery and is not appropriate for emergency situations. It takes many days to process the donated blood. RISKS AND COMPLICATIONS Although  transfusion therapy is very safe and saves many lives, the main dangers of transfusion include:   Getting an infectious disease.  Developing a transfusion reaction. This is an allergic reaction to something in the blood you were given. Every precaution is taken to prevent this.  The decision to have a blood transfusion has been considered carefully by your caregiver before blood is given. Blood is not given unless the benefits outweigh the risks. AFTER THE TRANSFUSION  Right after receiving a blood transfusion, you will usually feel much better and more energetic. This is especially true if your red blood cells have gotten low (anemic). The transfusion raises the level of the red blood cells which carry oxygen, and this usually causes an energy increase.  The nurse administering the transfusion will monitor you carefully for complications. HOME CARE INSTRUCTIONS  No special instructions are needed after a transfusion. You may find your energy is better. Speak with your caregiver about any limitations on activity for underlying diseases you may have. SEEK MEDICAL CARE IF:   Your condition is not improving after your transfusion.  You develop redness or irritation at the intravenous (IV) site. SEEK IMMEDIATE MEDICAL CARE IF:  Any of the following symptoms occur over the next 12 hours:  Shaking chills.  You have a temperature by mouth above 102 F (38.9 C), not controlled by medicine.  Chest, back, or muscle pain.  People around you feel you are not acting correctly or are confused.  Shortness of breath or difficulty breathing.  Dizziness and fainting.  You get a rash or develop hives.  You have a decrease in urine output.  Your urine turns a dark color or changes to pink, red, or brown. Any of the following symptoms occur over the next 10 days:  You have a temperature by mouth above 102 F (38.9 C), not controlled by medicine.  Shortness of breath.  Weakness after normal  activity.  The white part of the eye turns yellow (jaundice).  You have a decrease in the amount of urine or are urinating less often.  Your urine turns a dark color or changes to pink, red, or brown. Document Released: 05/13/2000 Document Revised: 08/08/2011 Document Reviewed: 12/31/2007 [facility] Patient Information 2014 [place], [place].  _______________________________________________________________________

## 2016-11-29 NOTE — Progress Notes (Signed)
CBC WITH DIF, BMET, TSH, HEPATIC FUNCTION, LDLD, 11-16-16 EPIC MEDICAL CLEARANCE NOTE 11-16-16 EPIC DR Caryl NeverBURCHETTE

## 2016-12-01 ENCOUNTER — Encounter (HOSPITAL_COMMUNITY): Payer: Self-pay

## 2016-12-01 ENCOUNTER — Encounter (HOSPITAL_COMMUNITY)
Admission: RE | Admit: 2016-12-01 | Discharge: 2016-12-01 | Disposition: A | Discharge status: Home / Self Care | Payer: Medicare Other | Source: Ambulatory Visit | Attending: Orthopedic Surgery | Admitting: Orthopedic Surgery

## 2016-12-01 DIAGNOSIS — Z01818 Encounter for other preprocedural examination: Secondary | ICD-10-CM | POA: Insufficient documentation

## 2016-12-01 DIAGNOSIS — I10 Essential (primary) hypertension: Secondary | ICD-10-CM | POA: Diagnosis not present

## 2016-12-01 HISTORY — DX: Abnormal electrocardiogram (ECG) (EKG): R94.31

## 2016-12-01 LAB — PROTIME-INR
INR: 1.01
Prothrombin Time: 13.3 seconds (ref 11.4–15.2)

## 2016-12-01 LAB — APTT: aPTT: 32 seconds (ref 24–36)

## 2016-12-01 LAB — SURGICAL PCR SCREEN
MRSA, PCR: NEGATIVE
Staphylococcus aureus: NEGATIVE

## 2016-12-01 NOTE — Progress Notes (Signed)
   12/01/16 1136  OBSTRUCTIVE SLEEP APNEA  Have you ever been diagnosed with sleep apnea through a sleep study? No  Do you snore loudly (loud enough to be heard through closed doors)?  1  Do you often feel tired, fatigued, or sleepy during the daytime (such as falling asleep during driving or talking to someone)? 0  Has anyone observed you stop breathing during your sleep? 1  Do you have, or are you being treated for high blood pressure? 0  BMI more than 35 kg/m2? 1  Age > 50 (1-yes) 1  Neck circumference greater than:Male 16 inches or larger, Male 17inches or larger? 1 (18.5 inches)  Male Gender (Yes=1) 1  Obstructive Sleep Apnea Score 6  Score 5 or greater  Results sent to PCP

## 2016-12-02 ENCOUNTER — Encounter (HOSPITAL_COMMUNITY): Payer: Self-pay

## 2016-12-07 ENCOUNTER — Encounter (HOSPITAL_COMMUNITY): Payer: Self-pay | Admitting: *Deleted

## 2016-12-07 ENCOUNTER — Inpatient Hospital Stay (HOSPITAL_COMMUNITY): Payer: Medicare Other | Admitting: Anesthesiology

## 2016-12-07 ENCOUNTER — Inpatient Hospital Stay (HOSPITAL_COMMUNITY)
Admission: RE | Admit: 2016-12-07 | Discharge: 2016-12-09 | DRG: 468 | Disposition: A | Discharge status: Home Health | Payer: Medicare Other | Source: Ambulatory Visit | Attending: Orthopedic Surgery | Admitting: Orthopedic Surgery

## 2016-12-07 ENCOUNTER — Encounter (HOSPITAL_COMMUNITY)
Admission: RE | Disposition: A | Discharge status: Home Health | Payer: Self-pay | Source: Ambulatory Visit | Attending: Orthopedic Surgery

## 2016-12-07 DIAGNOSIS — I440 Atrioventricular block, first degree: Secondary | ICD-10-CM | POA: Diagnosis present

## 2016-12-07 DIAGNOSIS — N400 Enlarged prostate without lower urinary tract symptoms: Secondary | ICD-10-CM | POA: Diagnosis present

## 2016-12-07 DIAGNOSIS — I10 Essential (primary) hypertension: Secondary | ICD-10-CM | POA: Diagnosis present

## 2016-12-07 DIAGNOSIS — E669 Obesity, unspecified: Secondary | ICD-10-CM | POA: Diagnosis present

## 2016-12-07 DIAGNOSIS — Y792 Prosthetic and other implants, materials and accessory orthopedic devices associated with adverse incidents: Secondary | ICD-10-CM | POA: Diagnosis present

## 2016-12-07 DIAGNOSIS — K2270 Barrett's esophagus without dysplasia: Secondary | ICD-10-CM | POA: Diagnosis not present

## 2016-12-07 DIAGNOSIS — G629 Polyneuropathy, unspecified: Secondary | ICD-10-CM | POA: Diagnosis present

## 2016-12-07 DIAGNOSIS — Z96659 Presence of unspecified artificial knee joint: Secondary | ICD-10-CM

## 2016-12-07 DIAGNOSIS — T84033A Mechanical loosening of internal left knee prosthetic joint, initial encounter: Secondary | ICD-10-CM | POA: Diagnosis present

## 2016-12-07 DIAGNOSIS — Z7982 Long term (current) use of aspirin: Secondary | ICD-10-CM

## 2016-12-07 DIAGNOSIS — G4730 Sleep apnea, unspecified: Secondary | ICD-10-CM | POA: Diagnosis not present

## 2016-12-07 DIAGNOSIS — H9190 Unspecified hearing loss, unspecified ear: Secondary | ICD-10-CM | POA: Diagnosis present

## 2016-12-07 DIAGNOSIS — Z6835 Body mass index (BMI) 35.0-35.9, adult: Secondary | ICD-10-CM

## 2016-12-07 DIAGNOSIS — K219 Gastro-esophageal reflux disease without esophagitis: Secondary | ICD-10-CM | POA: Diagnosis present

## 2016-12-07 DIAGNOSIS — Z79899 Other long term (current) drug therapy: Secondary | ICD-10-CM | POA: Diagnosis not present

## 2016-12-07 DIAGNOSIS — G8929 Other chronic pain: Secondary | ICD-10-CM | POA: Diagnosis present

## 2016-12-07 DIAGNOSIS — M179 Osteoarthritis of knee, unspecified: Secondary | ICD-10-CM | POA: Diagnosis present

## 2016-12-07 DIAGNOSIS — M25562 Pain in left knee: Secondary | ICD-10-CM | POA: Diagnosis present

## 2016-12-07 DIAGNOSIS — E785 Hyperlipidemia, unspecified: Secondary | ICD-10-CM | POA: Diagnosis not present

## 2016-12-07 DIAGNOSIS — T84018A Broken internal joint prosthesis, other site, initial encounter: Secondary | ICD-10-CM

## 2016-12-07 DIAGNOSIS — M1710 Unilateral primary osteoarthritis, unspecified knee: Secondary | ICD-10-CM | POA: Diagnosis present

## 2016-12-07 HISTORY — PX: TOTAL KNEE REVISION: SHX996

## 2016-12-07 LAB — TYPE AND SCREEN
ABO/RH(D): A POS
ANTIBODY SCREEN: NEGATIVE

## 2016-12-07 SURGERY — TOTAL KNEE REVISION
Anesthesia: Spinal | Site: Knee | Laterality: Left

## 2016-12-07 MED ORDER — ONDANSETRON HCL 4 MG/2ML IJ SOLN
INTRAMUSCULAR | Status: DC | PRN
  Administered 2016-12-07: 4 mg via INTRAVENOUS

## 2016-12-07 MED ORDER — CEFAZOLIN SODIUM-DEXTROSE 2-4 GM/100ML-% IV SOLN
2.0000 g | Freq: Four times a day (QID) | INTRAVENOUS | Status: AC
  Administered 2016-12-07 – 2016-12-08 (×2): 2 g via INTRAVENOUS
  Filled 2016-12-07 (×2): qty 100

## 2016-12-07 MED ORDER — PROPOFOL 10 MG/ML IV BOLUS
INTRAVENOUS | Status: AC
Start: 2016-12-07 — End: ?
  Filled 2016-12-07: qty 20

## 2016-12-07 MED ORDER — ONDANSETRON HCL 4 MG/2ML IJ SOLN
INTRAMUSCULAR | Status: AC
  Filled 2016-12-07: qty 2

## 2016-12-07 MED ORDER — TRAMADOL HCL 50 MG PO TABS: 50.0000 mg | ORAL_TABLET | Freq: Four times a day (QID) | ORAL | Status: DC | PRN

## 2016-12-07 MED ORDER — ACETAMINOPHEN 500 MG PO TABS
1000.0000 mg | ORAL_TABLET | Freq: Four times a day (QID) | ORAL | Status: AC
  Administered 2016-12-07 – 2016-12-08 (×4): 1000 mg via ORAL
  Filled 2016-12-07 (×6): qty 2

## 2016-12-07 MED ORDER — METOCLOPRAMIDE HCL 5 MG/ML IJ SOLN: 5.0000 mg | Freq: Three times a day (TID) | INTRAMUSCULAR | Status: DC | PRN

## 2016-12-07 MED ORDER — POLYETHYLENE GLYCOL 3350 17 G PO PACK: 17.0000 g | PACK | Freq: Every day | ORAL | Status: DC | PRN

## 2016-12-07 MED ORDER — METHOCARBAMOL 1000 MG/10ML IJ SOLN
500.0000 mg | Freq: Four times a day (QID) | INTRAVENOUS | Status: DC | PRN
  Administered 2016-12-07: 500 mg via INTRAVENOUS
  Filled 2016-12-07: qty 550

## 2016-12-07 MED ORDER — SODIUM CHLORIDE 0.9 % IJ SOLN
INTRAMUSCULAR | Status: DC | PRN
  Administered 2016-12-07: 30 mL

## 2016-12-07 MED ORDER — HYDROMORPHONE HCL-NACL 0.5-0.9 MG/ML-% IV SOSY
PREFILLED_SYRINGE | INTRAVENOUS | Status: AC
  Filled 2016-12-07: qty 1

## 2016-12-07 MED ORDER — 0.9 % SODIUM CHLORIDE (POUR BTL) OPTIME
TOPICAL | Status: DC | PRN
  Administered 2016-12-07: 1000 mL

## 2016-12-07 MED ORDER — ACETAMINOPHEN 10 MG/ML IV SOLN
INTRAVENOUS | Status: AC
  Filled 2016-12-07: qty 100

## 2016-12-07 MED ORDER — DEXAMETHASONE SODIUM PHOSPHATE 10 MG/ML IJ SOLN
10.0000 mg | Freq: Once | INTRAMUSCULAR | Status: AC
  Administered 2016-12-08: 10 mg via INTRAVENOUS
  Filled 2016-12-07: qty 1

## 2016-12-07 MED ORDER — PHENOL 1.4 % MT LIQD: 1.0000 | OROMUCOSAL | Status: DC | PRN

## 2016-12-07 MED ORDER — FENTANYL CITRATE (PF) 100 MCG/2ML IJ SOLN
100.0000 ug | Freq: Once | INTRAMUSCULAR | Status: AC
  Administered 2016-12-07: 50 ug via INTRAVENOUS

## 2016-12-07 MED ORDER — FLEET ENEMA 7-19 GM/118ML RE ENEM: 1.0000 | ENEMA | Freq: Once | RECTAL | Status: DC | PRN

## 2016-12-07 MED ORDER — RIVAROXABAN 10 MG PO TABS
10.0000 mg | ORAL_TABLET | Freq: Every day | ORAL | Status: DC
  Administered 2016-12-08 – 2016-12-09 (×2): 10 mg via ORAL
  Filled 2016-12-07 (×2): qty 1

## 2016-12-07 MED ORDER — MIDAZOLAM HCL 5 MG/ML IJ SOLN: 2.0000 mg | Freq: Once | INTRAMUSCULAR | Status: DC

## 2016-12-07 MED ORDER — SODIUM CHLORIDE 0.9 % IV SOLN
1000.0000 mg | INTRAVENOUS | Status: AC
  Administered 2016-12-07: 1000 mg via INTRAVENOUS
  Filled 2016-12-07: qty 1100

## 2016-12-07 MED ORDER — STERILE WATER FOR IRRIGATION IR SOLN
Status: DC | PRN
  Administered 2016-12-07: 2000 mL

## 2016-12-07 MED ORDER — SODIUM CHLORIDE 0.9 % IV SOLN
INTRAVENOUS | Status: DC
  Administered 2016-12-07: 23:00:00 via INTRAVENOUS

## 2016-12-07 MED ORDER — HYDROMORPHONE HCL-NACL 0.5-0.9 MG/ML-% IV SOSY
0.5000 mg | PREFILLED_SYRINGE | Freq: Once | INTRAVENOUS | Status: AC
  Administered 2016-12-07: 0.5 mg via INTRAVENOUS

## 2016-12-07 MED ORDER — ZINC SULFATE 220 (50 ZN) MG PO CAPS
220.0000 mg | ORAL_CAPSULE | Freq: Every day | ORAL | Status: DC
  Administered 2016-12-08 – 2016-12-09 (×2): 220 mg via ORAL
  Filled 2016-12-07 (×2): qty 1

## 2016-12-07 MED ORDER — LACTATED RINGERS IV SOLN
INTRAVENOUS | Status: DC
  Administered 2016-12-07 (×4): via INTRAVENOUS

## 2016-12-07 MED ORDER — OXYCODONE HCL 5 MG PO TABS
5.0000 mg | ORAL_TABLET | ORAL | Status: DC | PRN
  Administered 2016-12-07 – 2016-12-09 (×10): 10 mg via ORAL
  Filled 2016-12-07 (×11): qty 2

## 2016-12-07 MED ORDER — LORATADINE 10 MG PO TABS
10.0000 mg | ORAL_TABLET | Freq: Every evening | ORAL | Status: DC
  Administered 2016-12-08: 10 mg via ORAL
  Filled 2016-12-07: qty 1

## 2016-12-07 MED ORDER — MENTHOL 3 MG MT LOZG: 1.0000 | LOZENGE | OROMUCOSAL | Status: DC | PRN

## 2016-12-07 MED ORDER — CEFAZOLIN SODIUM-DEXTROSE 2-4 GM/100ML-% IV SOLN
INTRAVENOUS | Status: AC
  Filled 2016-12-07: qty 100

## 2016-12-07 MED ORDER — ACETAMINOPHEN 10 MG/ML IV SOLN: 1000.0000 mg | Freq: Once | INTRAVENOUS | Status: DC

## 2016-12-07 MED ORDER — SODIUM CHLORIDE 0.9 % IR SOLN
Status: DC | PRN
  Administered 2016-12-07: 3000 mL

## 2016-12-07 MED ORDER — ACETAMINOPHEN 325 MG PO TABS: 650.0000 mg | ORAL_TABLET | Freq: Four times a day (QID) | ORAL | Status: DC | PRN

## 2016-12-07 MED ORDER — SODIUM CHLORIDE 0.9 % IJ SOLN
INTRAMUSCULAR | Status: AC
  Filled 2016-12-07: qty 50

## 2016-12-07 MED ORDER — FENTANYL CITRATE (PF) 100 MCG/2ML IJ SOLN
25.0000 ug | INTRAMUSCULAR | Status: DC | PRN
  Administered 2016-12-07 (×2): 25 ug via INTRAVENOUS

## 2016-12-07 MED ORDER — METOCLOPRAMIDE HCL 5 MG PO TABS: 5.0000 mg | ORAL_TABLET | Freq: Three times a day (TID) | ORAL | Status: DC | PRN

## 2016-12-07 MED ORDER — PROPOFOL 500 MG/50ML IV EMUL
INTRAVENOUS | Status: DC | PRN
  Administered 2016-12-07: 100 ug/kg/min via INTRAVENOUS

## 2016-12-07 MED ORDER — ONDANSETRON HCL 4 MG/2ML IJ SOLN: 4.0000 mg | Freq: Four times a day (QID) | INTRAMUSCULAR | Status: DC | PRN

## 2016-12-07 MED ORDER — ONDANSETRON HCL 4 MG PO TABS: 4.0000 mg | ORAL_TABLET | Freq: Four times a day (QID) | ORAL | Status: DC | PRN

## 2016-12-07 MED ORDER — BISACODYL 10 MG RE SUPP: 10.0000 mg | Freq: Every day | RECTAL | Status: DC | PRN

## 2016-12-07 MED ORDER — ROPIVACAINE HCL 5 MG/ML IJ SOLN
INTRAMUSCULAR | Status: DC | PRN
  Administered 2016-12-07: 30 mL via PERINEURAL

## 2016-12-07 MED ORDER — FENTANYL CITRATE (PF) 100 MCG/2ML IJ SOLN
INTRAMUSCULAR | Status: AC
  Filled 2016-12-07: qty 2

## 2016-12-07 MED ORDER — FLUTICASONE PROPIONATE 50 MCG/ACT NA SUSP
2.0000 | Freq: Every day | NASAL | Status: DC
  Administered 2016-12-08: 2 via NASAL
  Filled 2016-12-07: qty 16

## 2016-12-07 MED ORDER — MONTELUKAST SODIUM 10 MG PO TABS
10.0000 mg | ORAL_TABLET | Freq: Every day | ORAL | Status: DC
  Administered 2016-12-07 – 2016-12-08 (×2): 10 mg via ORAL
  Filled 2016-12-07 (×2): qty 1

## 2016-12-07 MED ORDER — PANTOPRAZOLE SODIUM 40 MG PO TBEC
80.0000 mg | DELAYED_RELEASE_TABLET | Freq: Every day | ORAL | Status: DC
Start: 2016-12-08 — End: 2016-12-08

## 2016-12-07 MED ORDER — DIPHENHYDRAMINE HCL 12.5 MG/5ML PO ELIX: 12.5000 mg | ORAL_SOLUTION | ORAL | Status: DC | PRN

## 2016-12-07 MED ORDER — CEFAZOLIN SODIUM-DEXTROSE 2-4 GM/100ML-% IV SOLN
2.0000 g | INTRAVENOUS | Status: AC
  Administered 2016-12-07: 2 g via INTRAVENOUS

## 2016-12-07 MED ORDER — FENTANYL CITRATE (PF) 100 MCG/2ML IJ SOLN
50.0000 ug | Freq: Once | INTRAMUSCULAR | Status: AC
  Administered 2016-12-07: 50 ug via INTRAVENOUS

## 2016-12-07 MED ORDER — AZELASTINE HCL 0.1 % NA SOLN
2.0000 | Freq: Every evening | NASAL | Status: DC
  Administered 2016-12-08: 2 via NASAL
  Filled 2016-12-07: qty 30

## 2016-12-07 MED ORDER — DEXAMETHASONE SODIUM PHOSPHATE 10 MG/ML IJ SOLN
INTRAMUSCULAR | Status: AC
  Filled 2016-12-07: qty 1

## 2016-12-07 MED ORDER — DOXAZOSIN MESYLATE 8 MG PO TABS
8.0000 mg | ORAL_TABLET | Freq: Every day | ORAL | Status: DC
  Administered 2016-12-07 – 2016-12-08 (×2): 8 mg via ORAL
  Filled 2016-12-07 (×2): qty 1

## 2016-12-07 MED ORDER — MORPHINE SULFATE (PF) 4 MG/ML IV SOLN
1.0000 mg | INTRAVENOUS | Status: DC | PRN
  Administered 2016-12-07: 1 mg via INTRAVENOUS
  Filled 2016-12-07: qty 1

## 2016-12-07 MED ORDER — TRANEXAMIC ACID 1000 MG/10ML IV SOLN
1000.0000 mg | Freq: Once | INTRAVENOUS | Status: AC
  Administered 2016-12-07: 1000 mg via INTRAVENOUS
  Filled 2016-12-07: qty 1100

## 2016-12-07 MED ORDER — PROPOFOL 10 MG/ML IV BOLUS
INTRAVENOUS | Status: AC
  Filled 2016-12-07: qty 20

## 2016-12-07 MED ORDER — METHOCARBAMOL 500 MG PO TABS
500.0000 mg | ORAL_TABLET | Freq: Four times a day (QID) | ORAL | Status: DC | PRN
  Administered 2016-12-07 – 2016-12-09 (×2): 500 mg via ORAL
  Filled 2016-12-07 (×4): qty 1

## 2016-12-07 MED ORDER — ACETAMINOPHEN 650 MG RE SUPP: 650.0000 mg | Freq: Four times a day (QID) | RECTAL | Status: DC | PRN

## 2016-12-07 MED ORDER — BUPIVACAINE HCL (PF) 0.75 % IJ SOLN
INTRAMUSCULAR | Status: DC | PRN
  Administered 2016-12-07: 15 mg via INTRATHECAL

## 2016-12-07 MED ORDER — DOCUSATE SODIUM 100 MG PO CAPS
100.0000 mg | ORAL_CAPSULE | Freq: Two times a day (BID) | ORAL | Status: DC
  Administered 2016-12-07 – 2016-12-09 (×4): 100 mg via ORAL
  Filled 2016-12-07 (×4): qty 1

## 2016-12-07 MED ORDER — BUPIVACAINE LIPOSOME 1.3 % IJ SUSP
20.0000 mL | Freq: Once | INTRAMUSCULAR | Status: DC
  Filled 2016-12-07: qty 20

## 2016-12-07 MED ORDER — BUPIVACAINE LIPOSOME 1.3 % IJ SUSP
INTRAMUSCULAR | Status: DC | PRN
  Administered 2016-12-07: 20 mL

## 2016-12-07 MED ORDER — MIDAZOLAM HCL 2 MG/2ML IJ SOLN
INTRAMUSCULAR | Status: AC
  Filled 2016-12-07: qty 2

## 2016-12-07 MED ORDER — HYDRALAZINE HCL 20 MG/ML IJ SOLN
5.0000 mg | Freq: Once | INTRAMUSCULAR | Status: AC
  Administered 2016-12-07: 5 mg via INTRAVENOUS

## 2016-12-07 MED ORDER — DEXAMETHASONE SODIUM PHOSPHATE 10 MG/ML IJ SOLN
10.0000 mg | Freq: Once | INTRAMUSCULAR | Status: AC
  Administered 2016-12-07: 10 mg via INTRAVENOUS

## 2016-12-07 MED ORDER — PROPOFOL 10 MG/ML IV BOLUS
INTRAVENOUS | Status: AC
  Filled 2016-12-07: qty 40

## 2016-12-07 SURGICAL SUPPLY — 67 items
ADAPTER BOLT FEMORAL +2/-2 (Knees) ×1 IMPLANT
ADPR FEM +2/-2 OFST BOLT (Knees) ×1 IMPLANT
ADPR FEM 5D STRL KN PFC SGM (Orthopedic Implant) ×1 IMPLANT
AUG FEM SZ5 4 CMB POST STRL LF (Orthopedic Implant) ×1 IMPLANT
AUG FEM SZ5 4 STRL LF KN LT TI (Knees) ×2 IMPLANT
AUG FEM SZ5 8 CMB POST STRL LF (Orthopedic Implant) ×1 IMPLANT
AUG TIB SZ5 10 REV STP WDG (Knees) ×2 IMPLANT
AUGMENT FMRL POST PFC 4MM SZ5 (Orthopedic Implant) IMPLANT
AUGMENT POST 5 8 (Orthopedic Implant) ×1 IMPLANT
BAG DECANTER FOR FLEXI CONT (MISCELLANEOUS) ×2 IMPLANT
BAG SPEC THK2 15X12 ZIP CLS (MISCELLANEOUS) ×1
BAG ZIPLOCK 12X15 (MISCELLANEOUS) ×1 IMPLANT
BANDAGE ACE 6X5 VEL STRL LF (GAUZE/BANDAGES/DRESSINGS) ×2 IMPLANT
BLADE SAG 18X100X1.27 (BLADE) ×2 IMPLANT
BLADE SAW SGTL 11.0X1.19X90.0M (BLADE) ×2 IMPLANT
BONE CEMENT GENTAMICIN (Cement) ×6 IMPLANT
CEMENT BONE GENTAMICIN 40 (Cement) ×3 IMPLANT
CEMENT RESTRICTOR DEPUY SZ 4 (Cement) ×1 IMPLANT
CLOTH BEACON ORANGE TIMEOUT ST (SAFETY) ×2 IMPLANT
COVER SURGICAL LIGHT HANDLE (MISCELLANEOUS) ×2 IMPLANT
CUFF TOURN SGL QUICK 34 (TOURNIQUET CUFF) ×2
CUFF TRNQT CYL 34X4X40X1 (TOURNIQUET CUFF) ×1 IMPLANT
DRAPE U-SHAPE 47X51 STRL (DRAPES) ×2 IMPLANT
DRSG ADAPTIC 3X8 NADH LF (GAUZE/BANDAGES/DRESSINGS) ×2 IMPLANT
DURAPREP 26ML APPLICATOR (WOUND CARE) ×2 IMPLANT
ELECT REM PT RETURN 15FT ADLT (MISCELLANEOUS) ×2 IMPLANT
EVACUATOR 1/8 PVC DRAIN (DRAIN) ×2 IMPLANT
FEM POST AUG PFC 4MM SZ5 (Orthopedic Implant) ×2 IMPLANT
FEMORAL ADAPTER (Orthopedic Implant) ×1 IMPLANT
FEMUR LFT TC3 REVISION (Knees) ×1 IMPLANT
GAUZE SPONGE 4X4 12PLY STRL (GAUZE/BANDAGES/DRESSINGS) ×2 IMPLANT
GLOVE BIO SURGEON STRL SZ7.5 (GLOVE) ×1 IMPLANT
GLOVE BIO SURGEON STRL SZ8 (GLOVE) ×2 IMPLANT
GLOVE BIOGEL PI IND STRL 7.5 (GLOVE) IMPLANT
GLOVE BIOGEL PI IND STRL 8 (GLOVE) ×1 IMPLANT
GLOVE BIOGEL PI INDICATOR 7.5 (GLOVE) ×5
GLOVE BIOGEL PI INDICATOR 8 (GLOVE) ×2
GLOVE SURG SS PI 7.5 STRL IVOR (GLOVE) ×1 IMPLANT
GOWN STRL REUS W/ TWL XL LVL3 (GOWN DISPOSABLE) IMPLANT
GOWN STRL REUS W/TWL LRG LVL3 (GOWN DISPOSABLE) ×2 IMPLANT
GOWN STRL REUS W/TWL XL LVL3 (GOWN DISPOSABLE) ×4 IMPLANT
HANDPIECE INTERPULSE COAX TIP (DISPOSABLE) ×2
IMMOBILIZER KNEE 20 (SOFTGOODS) ×2
IMMOBILIZER KNEE 20 THIGH 36 (SOFTGOODS) ×1 IMPLANT
INSERT SIGMA RP TC3 5 17.5 (Knees) ×1 IMPLANT
MANIFOLD NEPTUNE II (INSTRUMENTS) ×2 IMPLANT
PACK TOTAL KNEE CUSTOM (KITS) ×2 IMPLANT
PAD ABD 8X10 STRL (GAUZE/BANDAGES/DRESSINGS) ×1 IMPLANT
PADDING CAST COTTON 6X4 STRL (CAST SUPPLIES) ×5 IMPLANT
POSITIONER SURGICAL ARM (MISCELLANEOUS) ×2 IMPLANT
SET HNDPC FAN SPRY TIP SCT (DISPOSABLE) ×1 IMPLANT
STAPLER VISISTAT 35W (STAPLE) ×1 IMPLANT
STEM FLUTED UNIV REV 75X20 (Stem) ×1 IMPLANT
STEM TIBIA PFC 13X30MM (Stem) ×1 IMPLANT
SUT PDS AB 1 CT1 27 (SUTURE) ×1 IMPLANT
SUT STRATAFIX 0 PDS 27 VIOLET (SUTURE) ×2
SUT VIC AB 2-0 CT1 27 (SUTURE) ×6
SUT VIC AB 2-0 CT1 TAPERPNT 27 (SUTURE) ×3 IMPLANT
SUTURE STRATFX 0 PDS 27 VIOLET (SUTURE) ×1 IMPLANT
SYR 50ML LL SCALE MARK (SYRINGE) ×4 IMPLANT
TOWER CARTRIDGE SMART MIX (DISPOSABLE) ×2 IMPLANT
TRAY FOLEY W/METER SILVER 16FR (SET/KITS/TRAYS/PACK) ×2 IMPLANT
TRAY SLEEVE CEM ML (Knees) ×1 IMPLANT
TRAY TIB SZ 5 REVISION (Knees) ×1 IMPLANT
WEDGE LEFT SIZE 54MM (Knees) ×2 IMPLANT
WEDGE SZ 5 5MM 10MM (Knees) ×2 IMPLANT
WRAP KNEE MAXI GEL POST OP (GAUZE/BANDAGES/DRESSINGS) ×1 IMPLANT

## 2016-12-07 NOTE — Anesthesia Postprocedure Evaluation (Signed)
Anesthesia Post Note  Patient: Isaiah Wilson  Procedure(s) Performed: Procedure(s) (LRB): LEFT TOTAL KNEE REVISION (Left)     Patient location during evaluation: PACU Anesthesia Type: Spinal and Regional Level of consciousness: oriented and awake and alert Pain management: pain level controlled Vital Signs Assessment: post-procedure vital signs reviewed and stable Respiratory status: spontaneous breathing, respiratory function stable and patient connected to nasal cannula oxygen Cardiovascular status: blood pressure returned to baseline and stable Postop Assessment: no headache and no backache Anesthetic complications: no    Last Vitals:  Vitals:   12/07/16 1830 12/07/16 1932  BP: (!) 152/94 (!) 167/88  Pulse: 63 62  Resp: 18 19  Temp: 36.7 C 36.4 C    Last Pain:  Vitals:   12/07/16 1932  TempSrc: Oral  PainSc:                  [name]

## 2016-12-07 NOTE — Progress Notes (Signed)
AssistedDr. Bradley FerrisEllender with left, knee, adductor canal block. Side rails up, monitors on throughout procedure. See vital signs in flow sheet. Tolerated Procedure well. Pt has no s/s of distress. Will continue to monitor and tx pt according to MD orders.

## 2016-12-07 NOTE — Anesthesia Procedure Notes (Signed)
Spinal  Patient location during procedure: OR Start time: 12/07/2016 1:21 PM End time: 12/07/2016 1:23 PM Staffing Resident/CRNA: Harle Stanford R Performed: resident/CRNA  Preanesthetic Checklist Completed: patient identified, site marked, surgical consent, pre-op evaluation, timeout performed, IV checked, risks and benefits discussed and monitors and equipment checked Spinal Block Patient position: sitting Prep: Betadine Patient monitoring: heart rate Approach: right paramedian Location: L3-4 Injection technique: single-shot Needle Needle type: Spinocan  Needle gauge: 22 G Needle length: 9 cm Needle insertion depth: 7 cm Assessment Sensory level: T6 Additional Notes Timeout performed. SAB kit date checked. SAB without difficulty

## 2016-12-07 NOTE — Anesthesia Procedure Notes (Signed)
Anesthesia Regional Block: Adductor canal block   Pre-Anesthetic Checklist: ,, timeout performed, Correct Patient, Correct Site, Correct Laterality, Correct Procedure,, site marked, risks and benefits discussed, Surgical consent,  Pre-op evaluation,  At surgeon's request and post-op pain management  Laterality: Left  Prep: chloraprep       Needles:  Injection technique: Single-shot  Needle Type: Echogenic Stimulator Needle     Needle Length: 9cm  Needle Gauge: 21     Additional Needles:   Procedures: ultrasound guided,,,,,,,,  Narrative:  Start time: 12/07/2016 12:00 PM End time: 12/07/2016 12:10 PM Injection made incrementally with aspirations every 5 mL.  Performed by: Personally  Anesthesiologist: Karna ChristmasELLENDER, RYAN P  Additional Notes: Functioning IV was confirmed and monitors were applied.  A 90mm 21ga Arrow echogenic stimulator needle was used. Sterile prep,hand hygiene and sterile gloves were used.  Negative aspiration and negative test dose prior to incremental administration of local anesthetic. The patient tolerated the procedure well.

## 2016-12-07 NOTE — Anesthesia Preprocedure Evaluation (Addendum)
Anesthesia Evaluation  Patient identified by MRN, date of birth, ID band Patient awake    Reviewed: Allergy & Precautions, NPO status , Patient's Chart, lab work & pertinent test results  Airway Mallampati: III  TM Distance: >3 FB Neck ROM: Full    Dental  (+) Teeth Intact   Pulmonary neg pulmonary ROS,    breath sounds clear to auscultation       Cardiovascular hypertension, Pt. 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   Neuro/Psych negative neurological ROS     GI/Hepatic Neg liver ROS, GERD  Medicated and Controlled,  Endo/Other  negative endocrine ROS  Renal/GU negative Renal ROS     Musculoskeletal  (+) Arthritis ,   Abdominal (+) + obese,   Peds  Hematology negative hematology ROS (+)   Anesthesia Other Findings Hyperlipidemia  Obese   Reproductive/Obstetrics                            Lab Results  Component Value Date   WBC 8.8 11/16/2016   HGB 14.0 11/16/2016   HCT 41.4 11/16/2016   MCV 87.7 11/16/2016   PLT 198.0 11/16/2016   Lab Results  Component Value Date   CREATININE 1.03 11/16/2016   BUN 15 11/16/2016   NA 142 11/16/2016   K 4.1 11/16/2016   CL 107 11/16/2016   CO2 26 11/16/2016   Lab Results  Component Value Date   INR 1.01 12/01/2016   INR 1.05 11/03/2015     Anesthesia Physical  Anesthesia Plan  ASA: II  Anesthesia Plan: Spinal   Post-op Pain Management:    Induction: Intravenous  PONV Risk Score and Plan: 1 and Ondansetron, Dexamethasone and Propofol  Airway Management Planned:   Additional Equipment:   Intra-op Plan:   Post-operative Plan:   Informed Consent: I have reviewed the patients History and Physical, chart, labs and discussed the procedure including the risks, benefits and alternatives for the proposed anesthesia with the patient or authorized representative who has indicated his/her  understanding and acceptance.   Dental advisory given  Plan Discussed with: CRNA  Anesthesia Plan Comments:         Anesthesia Quick Evaluation

## 2016-12-07 NOTE — Brief Op Note (Signed)
12/07/2016  3:07 PM  PATIENT:  Isaiah Wilson  82 y.o. male  PRE-OPERATIVE DIAGNOSIS:  Loosening left total knee arthroplasty   POST-OPERATIVE DIAGNOSIS:  Loosening left total knee arthroplasty   PROCEDURE:  Procedure(s) with comments: LEFT TOTAL KNEE REVISION (Left) - Adductor Block  SURGEON:  Surgeon(s) and Role:    Ollen Gross* Cristiana Yochim, MD - Primary  PHYSICIAN ASSISTANT:   ASSISTANTS: Avel Peacerew Perkins, PA-C   ANESTHESIA:   spinal  EBL:  Total I/O In: 1000 [I.V.:1000] Out: 120 [Urine:100; Blood:20]  BLOOD ADMINISTERED:none  DRAINS: (Medium) Hemovact drain(s) in the left knee with  Suction Open   LOCAL MEDICATIONS USED:  OTHER Exparel  COUNTS:  YES  TOURNIQUET:   Total Tourniquet Time Documented: Thigh (Left) - 42 minutes Thigh (Left) - 21 minutes Total: Thigh (Left) - 63 minutes   DICTATION: .Other Dictation: Dictation Number [id number]  PLAN OF CARE: Admit to inpatient   PATIENT DISPOSITION:  PACU - hemodynamically stable.

## 2016-12-07 NOTE — Transfer of Care (Signed)
Immediate Anesthesia Transfer of Care Note  Patient: Isaiah Wilson  Procedure(s) Performed: Procedure(s) with comments: LEFT TOTAL KNEE REVISION (Left) - Adductor Block  Patient Location: PACU  Anesthesia Type:Regional and Spinal  Level of Consciousness: sedated  Airway & Oxygen Therapy: Patient Spontanous Breathing and Patient connected to face mask oxygen  Post-op Assessment: Report given to RN and Post -op Vital signs reviewed and stable  Post vital signs: Reviewed and stable  Last Vitals:  Vitals:   12/07/16 1216 12/07/16 1217  BP:    Pulse: (!) 55 (!) 53  Resp: 19 16  Temp:      Last Pain:  Vitals:   12/07/16 1210  TempSrc:   PainSc: 0-No pain         Complications: No apparent anesthesia complications

## 2016-12-07 NOTE — Interval H&P Note (Signed)
History and Physical Interval Note:  12/07/2016 1:11 PM  Isaiah RuddJames L Ishaq  has presented today for surgery, with the diagnosis of Loosening left total knee arthroplasty   The various methods of treatment have been discussed with the patient and family. After consideration of risks, benefits and other options for treatment, the patient has consented to  Procedure(s): LEFT TOTAL KNEE REVISION (Left) as a surgical intervention .  The patient's history has been reviewed, patient examined, no change in status, stable for surgery.  I have reviewed the patient's chart and labs.  Questions were answered to the patient's satisfaction.     Loanne DrillingALUISIO,Zyra Parrillo V

## 2016-12-07 NOTE — Discharge Instructions (Addendum)
° °Dr. Frank Aluisio °Total Joint Specialist °[facility] °[address] °, [place] [address] °[phone] ° °TOTAL KNEE REPLACEMENT POSTOPERATIVE DIRECTIONS ° °Knee Rehabilitation, Guidelines Following Surgery  °Results after knee surgery are often greatly improved when you follow the exercise, range of motion and muscle strengthening exercises prescribed by your doctor. Safety measures are also important to protect the knee from further injury. Any time any of these exercises cause you to have increased pain or swelling in your knee joint, decrease the amount until you are comfortable again and slowly increase them. If you have problems or questions, call your caregiver or physical therapist for advice.  ° °HOME CARE INSTRUCTIONS  °Remove items at home which could result in a fall. This includes throw rugs or furniture in walking pathways.  °· ICE to the affected knee every three hours for 30 minutes at a time and then as needed for pain and swelling.  Continue to use ice on the knee for pain and swelling from surgery. You may notice swelling that will progress down to the foot and ankle.  This is normal after surgery.  Elevate the leg when you are not up walking on it.   °· Continue to use the breathing machine which will help keep your temperature down.  It is common for your temperature to cycle up and down following surgery, especially at night when you are not up moving around and exerting yourself.  The breathing machine keeps your lungs expanded and your temperature down. °· Do not place pillow under knee, focus on keeping the knee straight while resting ° °DIET °You may resume your previous home diet once your are discharged from the hospital. ° °DRESSING / WOUND CARE / SHOWERING °You may shower 3 days after surgery, but keep the wounds dry during showering.  You may use an occlusive plastic wrap (Press'n Seal for example), NO SOAKING/SUBMERGING IN THE BATHTUB.  If the  bandage gets wet, change with a clean dry gauze.  If the incision gets wet, pat the wound dry with a clean towel. °You may start showering once you are discharged home but do not submerge the incision under water. Just pat the incision dry and apply a dry gauze dressing on daily. °Change the surgical dressing daily and reapply a dry dressing each time. ° °ACTIVITY °Walk with your walker as instructed. °Use walker as long as suggested by your caregivers. °Avoid periods of inactivity such as sitting longer than an hour when not asleep. This helps prevent blood clots.  °You may resume a sexual relationship in one month or when given the OK by your doctor.  °You may return to work once you are cleared by your doctor.  °Do not drive a car for 6 weeks or until released by you surgeon.  °Do not drive while taking narcotics. ° °WEIGHT BEARING °Weight bearing as tolerated with assist device (walker, cane, etc) as directed, use it as long as suggested by your surgeon or therapist, typically at least 4-6 weeks. ° °POSTOPERATIVE CONSTIPATION PROTOCOL °Constipation - defined medically as fewer than three stools per week and severe constipation as less than one stool per week. ° °One of the most common issues patients have following surgery is constipation.  Even if you have a regular bowel pattern at home, your normal regimen is likely to be disrupted due to multiple reasons following surgery.  Combination of anesthesia, postoperative narcotics, change in appetite and fluid intake all can affect your bowels.    In order to avoid complications following surgery, here are some recommendations in order to help you during your recovery period. ° °Colace (docusate) - Pick up an over-the-counter form of Colace or another stool softener and take twice a day as long as you are requiring postoperative pain medications.  Take with a full glass of water daily.  If you experience loose stools or diarrhea, hold the colace until you stool forms  back up.  If your symptoms do not get better within 1 week or if they get worse, check with your doctor. ° °Dulcolax (bisacodyl) - Pick up over-the-counter and take as directed by the product packaging as needed to assist with the movement of your bowels.  Take with a full glass of water.  Use this product as needed if not relieved by Colace only.  ° °MiraLax (polyethylene glycol) - Pick up over-the-counter to have on hand.  MiraLax is a solution that will increase the amount of water in your bowels to assist with bowel movements.  Take as directed and can mix with a glass of water, juice, soda, coffee, or tea.  Take if you go more than two days without a movement. °Do not use MiraLax more than once per day. Call your doctor if you are still constipated or irregular after using this medication for 7 days in a row. ° °If you continue to have problems with postoperative constipation, please contact the office for further assistance and recommendations.  If you experience "the worst abdominal pain ever" or develop nausea or vomiting, please contact the office immediatly for further recommendations for treatment. ° °ITCHING ° If you experience itching with your medications, try taking only a single pain pill, or even half a pain pill at a time.  You can also use Benadryl over the counter for itching or also to help with sleep.  ° °TED HOSE STOCKINGS °Wear the elastic stockings on both legs for three weeks following surgery during the day but you may remove then at night for sleeping. ° °MEDICATIONS °See your medication summary on the “After Visit Summary” that the nursing staff will review with you prior to discharge.  You may have some home medications which will be placed on hold until you complete the course of blood thinner medication.  It is important for you to complete the blood thinner medication as prescribed by your surgeon.  Continue your approved medications as instructed at time of  discharge. ° °PRECAUTIONS °If you experience chest pain or shortness of breath - call 911 immediately for transfer to the hospital emergency department.  °If you develop a fever greater that 101 F, purulent drainage from wound, increased redness or drainage from wound, foul odor from the wound/dressing, or calf pain - CONTACT YOUR SURGEON.   °                                                °FOLLOW-UP APPOINTMENTS °Make sure you keep all of your appointments after your operation with your surgeon and caregivers. You should call the office at the above phone number and make an appointment for approximately two weeks after the date of your surgery or on the date instructed by your surgeon outlined in the "After Visit Summary". ° ° °RANGE OF MOTION AND STRENGTHENING EXERCISES  °Rehabilitation of the knee is important following a knee injury or   an operation. After just a few days of immobilization, the muscles of the thigh which control the knee become weakened and shrink (atrophy). Knee exercises are designed to build up the tone and strength of the thigh muscles and to improve knee motion. Often times heat used for twenty to thirty minutes before working out will loosen up your tissues and help with improving the range of motion but do not use heat for the first two weeks following surgery. These exercises can be done on a training (exercise) mat, on the floor, on a table or on a bed. Use what ever works the best and is most comfortable for you Knee exercises include:  °Leg Lifts - While your knee is still immobilized in a splint or cast, you can do straight leg raises. Lift the leg to 60 degrees, hold for 3 sec, and slowly lower the leg. Repeat 10-20 times 2-3 times daily. Perform this exercise against resistance later as your knee gets better.  °Quad and Hamstring Sets - Tighten up the muscle on the front of the thigh (Quad) and hold for 5-10 sec. Repeat this 10-20 times hourly. Hamstring sets are done by pushing the  foot backward against an object and holding for 5-10 sec. Repeat as with quad sets.  °· Leg Slides: Lying on your back, slowly slide your foot toward your buttocks, bending your knee up off the floor (only go as far as is comfortable). Then slowly slide your foot back down until your leg is flat on the floor again. °· Angel Wings: Lying on your back spread your legs to the side as far apart as you can without causing discomfort.  °A rehabilitation program following serious knee injuries can speed recovery and prevent re-injury in the future due to weakened muscles. Contact your doctor or a physical therapist for more information on knee rehabilitation.  ° °IF YOU ARE TRANSFERRED TO A SKILLED REHAB FACILITY °If the patient is transferred to a skilled rehab facility following release from the hospital, a list of the current medications will be sent to the facility for the patient to continue.  When discharged from the skilled rehab facility, please have the facility set up the patient's Home Health Physical Therapy prior to being released. Also, the skilled facility will be responsible for providing the patient with their medications at time of release from the facility to include their pain medication, the muscle relaxants, and their blood thinner medication. If the patient is still at the rehab facility at time of the two week follow up appointment, the skilled rehab facility will also need to assist the patient in arranging follow up appointment in our office and any transportation needs. ° °MAKE SURE YOU:  °Understand these instructions.  °Get help right away if you are not doing well or get worse.  ° ° °Pick up stool softner and laxative for home use following surgery while on pain medications. °Do not submerge incision under water. °Please use good hand washing techniques while changing dressing each day. °May shower starting three days after surgery. °Please use a clean towel to pat the incision dry following  showers. °Continue to use ice for pain and swelling after surgery. °Do not use any lotions or creams on the incision until instructed by your surgeon. ° °Take Xarelto for two and a half more weeks following discharge from the hospital, then discontinue Xarelto. °Once the patient has completed the Xarelto, they may resume the 81 mg Aspirin. ° ° ° °Information on   my medicine - XARELTO® (Rivaroxaban) ° °Why was Xarelto® prescribed for you? °Xarelto® was prescribed for you to reduce the risk of blood clots forming after orthopedic surgery. The medical term for these abnormal blood clots is venous thromboembolism (VTE). ° °What do you need to know about xarelto® ? °Take your Xarelto® ONCE DAILY at the same time every day. °You may take it either with or without food. ° °If you have difficulty swallowing the tablet whole, you may crush it and mix in applesauce just prior to taking your dose. ° °Take Xarelto® exactly as prescribed by your doctor and DO NOT stop taking Xarelto® without talking to the doctor who prescribed the medication.  Stopping without other VTE prevention medication to take the place of Xarelto® may increase your risk of developing a clot. ° °After discharge, you should have regular check-up appointments with your healthcare provider that is prescribing your Xarelto®.   ° °What do you do if you miss a dose? °If you miss a dose, take it as soon as you remember on the same day then continue your regularly scheduled once daily regimen the next day. Do not take two doses of Xarelto® on the same day.  ° °Important Safety Information °A possible side effect of Xarelto® is bleeding. You should call your healthcare provider right away if you experience any of the following: °? Bleeding from an injury or your nose that does not stop. °? Unusual colored urine (red or dark brown) or unusual colored stools (red or black). °? Unusual bruising for unknown reasons. °? A serious fall or if you hit your head (even if  there is no bleeding). ° °Some medicines may interact with Xarelto® and might increase your risk of bleeding while on Xarelto®. To help avoid this, consult your healthcare provider or pharmacist prior to using any new prescription or non-prescription medications, including herbals, vitamins, non-steroidal anti-inflammatory drugs (NSAIDs) and supplements. ° °This website has more information on Xarelto®: www.xarelto.com. ° ° ° °

## 2016-12-08 ENCOUNTER — Encounter (HOSPITAL_COMMUNITY): Payer: Self-pay | Admitting: Orthopedic Surgery

## 2016-12-08 LAB — CBC
HCT: 34.6 % — ABNORMAL LOW (ref 39.0–52.0)
Hemoglobin: 12 g/dL — ABNORMAL LOW (ref 13.0–17.0)
MCH: 29.7 pg (ref 26.0–34.0)
MCHC: 34.7 g/dL (ref 30.0–36.0)
MCV: 85.6 fL (ref 78.0–100.0)
PLATELETS: 172 10*3/uL (ref 150–400)
RBC: 4.04 MIL/uL — ABNORMAL LOW (ref 4.22–5.81)
RDW: 14.2 % (ref 11.5–15.5)
WBC: 14.5 10*3/uL — AB (ref 4.0–10.5)

## 2016-12-08 LAB — BASIC METABOLIC PANEL
ANION GAP: 6 (ref 5–15)
BUN: 11 mg/dL (ref 6–20)
CALCIUM: 8.3 mg/dL — AB (ref 8.9–10.3)
CO2: 27 mmol/L (ref 22–32)
CREATININE: 0.92 mg/dL (ref 0.61–1.24)
Chloride: 105 mmol/L (ref 101–111)
Glucose, Bld: 131 mg/dL — ABNORMAL HIGH (ref 65–99)
Potassium: 4.1 mmol/L (ref 3.5–5.1)
SODIUM: 138 mmol/L (ref 135–145)

## 2016-12-08 MED ORDER — ESOMEPRAZOLE MAGNESIUM 40 MG PO CPDR
40.0000 mg | DELAYED_RELEASE_CAPSULE | Freq: Every day | ORAL | Status: DC
  Administered 2016-12-08: 40 mg via ORAL
  Filled 2016-12-08 (×2): qty 1

## 2016-12-08 MED ORDER — NON FORMULARY
40.0000 mg | Freq: Every day | Status: DC
Start: 2016-12-08 — End: 2016-12-08

## 2016-12-08 NOTE — Progress Notes (Signed)
Discharge planning, spoke with patient at bedside. Have chosen [facility] for HH PT. Contacted [facility] for referral. Has RW and 3n1. [phone] 

## 2016-12-08 NOTE — Evaluation (Addendum)
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 Pt has A of wife prn.    Follow Up Recommendations  No OT follow up    Equipment Recommendations  None recommended by OT       Precautions / Restrictions Precautions Precautions: Knee;Fall Restrictions Weight Bearing Restrictions: No Other Position/Activity Restrictions: WBAT      Mobility Bed Mobility Overal bed mobility: Modified Independent Bed Mobility: Supine to Sit;Sit to Supine     Supine to sit: HOB elevated;Modified independent (Device/Increase time) Sit to supine: HOB elevated;Modified independent (Device/Increase time)   General bed mobility comments: able to get his LLE off of bed by itself and back on the bed by hooking his LLE with his RLE  Transfers Overall transfer level: Needs assistance Equipment used: Rolling walker (2 wheeled) Transfers: Sit to/from Stand Sit to Stand: Min guard            Balance Overall balance assessment: Needs assistance Sitting-balance support: No upper extremity supported;Feet supported Sitting balance-Leahy Scale: Good     Standing balance support: Bilateral upper extremity supported;During functional activity Standing balance-Leahy Scale: Poor                             ADL either performed or assessed with clinical judgement   ADL Overall ADL's : Needs assistance/impaired Eating/Feeding: Independent;Sitting   Grooming: Set up;Sitting   Upper Body Bathing: Set up;Sitting   Lower Body Bathing: Min guard;Sit to/from stand   Upper Body Dressing : Set up;Sitting   Lower Body Dressing: Minimal assistance(can get  sock off of LLE with increased time, but struggles with getting it on, does have a sock aid at home) Lower Body Dressing Details (indicate cue type and reason): minguard A sit<>stand Toilet Transfer: Min guard   Toileting- Clothing Manipulation and Hygiene: Min guard;Sit to/from stand     Tub/Shower Transfer Details (indicate cue type and reason): pt is able to stand in one leg stance with use of RW and raise alternately legs up as if to step into a tub. I recommended that he try stepping into tub backward so that he has support of RW in front of him; however pt feels that the way he has been doing it will work even post knee sx. I just advised him to take his time when trying this for the first time         Vision Patient Visual Report: No change from baseline              Pertinent Vitals/Pain Pain Assessment: 0-10 Pain Score: 4  Pain Location: L knee Pain Descriptors / Indicators: Aching;Sore Pain Intervention(s): Limited activity within patient's tolerance;Monitored during session     Hand Dominance Right   Extremity/Trunk Assessment Upper Extremity Assessment Upper Extremity Assessment: Overall WFL for tasks assessed     Communication Communication Communication: HOH   Cognition Arousal/Alertness: Awake/alert Behavior During Therapy: WFL for tasks assessed/performed Overall Cognitive Status: Within Functional Limits for tasks assessed  Home Living Family/patient expects to be discharged to:: Private residence Living Arrangements: Spouse/significant other Available Help at Discharge: Family;Available 24 hours/day Type of Home: House Home Access: Stairs to enter Entergy CorporationEntrance Stairs-Number of Steps: 1   Home Layout: One level     Bathroom Shower/Tub: Tub/shower unit;Curtain   Bathroom Toilet: Handicapped height     Home Equipment: Environmental consultantWalker - 2 wheels;Bedside commode;Cane - single point;Shower  seat;Adaptive equipment Adaptive Equipment: Sock aid        Prior Functioning/Environment Level of Independence: Independent                 OT Problem List: Decreased range of motion;Pain;Impaired balance (sitting and/or standing)         OT Goals(Current goals can be found in the care plan section) Acute Rehab OT Goals Patient Stated Goal: get back to church and being able to eat out lunch with buddies once a month  OT Frequency:                AM-PAC PT "6 Clicks" Daily Activity     Outcome Measure Help from another person eating meals?: None Help from another person taking care of personal grooming?: None Help from another person toileting, which includes using toliet, bedpan, or urinal?: A Little Help from another person bathing (including washing, rinsing, drying)?: A Little Help from another person to put on and taking off regular upper body clothing?: None Help from another person to put on and taking off regular lower body clothing?: A Little 6 Click Score: 21   End of Session Equipment Utilized During Treatment: Rolling walker  Activity Tolerance: Patient tolerated treatment well Patient left: in bed;with call bell/phone within reach;with family/visitor present  OT Visit Diagnosis: Unsteadiness on feet (R26.81);Pain Pain - Right/Left: Left Pain - part of body: Knee                Time: [id number] OT Time Calculation (min): 20 min Charges:  OT General Charges $OT Visit: 1 Procedure OT Evaluation $OT Eval Moderate Complexity: 1 Procedure Ignacia PalmaCathy Veronica Guerrant, OTR/L [phone] 12/08/2016

## 2016-12-08 NOTE — Progress Notes (Signed)
   Subjective: 1 Day Post-Op Procedure(s) (LRB): LEFT TOTAL KNEE REVISION (Left) Patient reports pain as mild.   Patient seen in rounds by Dr. Lequita HaltAluisio. Patient is well, but has had some minor complaints of pain in the knee, requiring pain medications We will start therapy today.  Plan is to go Home after hospital stay.  Objective: Vital signs in last 24 hours: Temp:  [97.6 F (36.4 C)-99.1 F (37.3 C)] 98.6 F (37 C) (07/12 0700) Pulse Rate:  [50-87] 85 (07/12 0700) Resp:  [12-30] 19 (07/12 0700) BP: (120-198)/(65-104) 120/65 (07/12 0700) SpO2:  [93 %-99 %] 99 % (07/12 0700) Weight:  [115.2 kg (254 lb)] 115.2 kg (254 lb) (07/11 1830)  Intake/Output from previous day:  Intake/Output Summary (Last 24 hours) at 12/08/16 0742 Last data filed at 12/08/16 0615  Gross per 24 hour  Intake             3860 ml  Output             3130 ml  Net              730 ml    Intake/Output this shift: No intake/output data recorded.  Labs:  Recent Labs  12/08/16 0508  HGB 12.0*    Recent Labs  12/08/16 0508  WBC 14.5*  RBC 4.04*  HCT 34.6*  PLT 172    Recent Labs  12/08/16 0508  NA 138  K 4.1  CL 105  CO2 27  BUN 11  CREATININE 0.92  GLUCOSE 131*  CALCIUM 8.3*   No results for input(s): LABPT, INR in the last 72 hours.  EXAM General - Patient is Alert, Appropriate and Oriented Extremity - Neurovascular intact Sensation intact distally Intact pulses distally Dorsiflexion/Plantar flexion intact Dressing - dressing C/D/I Motor Function - intact, moving foot and toes well on exam.  Hemovac left in place  Past Medical History:  Diagnosis Date  . Abnormal EKG    hx left anterior fasicular block on old ekg 2017  . Allergic rhinitis   . Allergy   . Barrett's esophagus   . Benign prostatic hypertrophy   . Colitis, ischemic (HCC)    X 2  . Complication of anesthesia    SLOW TO AWAKEN AFTER 1 SURGERY 20  YRS AGO  . DJD (degenerative joint disease)    SPINE, AND  OA  . GERD (gastroesophageal reflux disease)   . HOH (hard of hearing)    BOTH EARS  . Hyperlipidemia   . Hypertension    pt denies says cardura for bladder  . Neuropathy    LEFT HAND NUMB AND BURNS ALL THE TIME  . Overweight(278.02)     Assessment/Plan: 1 Day Post-Op Procedure(s) (LRB): LEFT TOTAL KNEE REVISION (Left) Active Problems:   OA (osteoarthritis) of knee  Estimated body mass index is 35.43 kg/m as calculated from the following:   Height as of this encounter: 5\' 11"  (1.803 m).   Weight as of this encounter: 115.2 kg (254 lb). Advance diet Up with therapy Plan for discharge tomorrow Discharge home with home health  DVT Prophylaxis - Xarelto Weight-Bearing as tolerated to left leg D/C O2 and Pulse OX and try on Room Air  Avel Peacerew Perkins, PA-C Orthopaedic Surgery 12/08/2016, 7:42 AM

## 2016-12-08 NOTE — Progress Notes (Signed)
Physical Therapy Treatment Patient Details Name: Isaiah RuddJames L Wilson MRN: [id number] DOB: 04/02/1935 Today's Date: 12/08/2016    History of Present Illness Pt s/p L TKR revision (this is 3rd one on this knee)and with hx of R TKR     PT Comments    Pt very motivated, progressing well and hopeful for dc tomorrow.   Follow Up Recommendations  Home health PT     Equipment Recommendations  None recommended by PT    Recommendations for Other Services       Precautions / Restrictions Precautions Precautions: Knee;Fall Required Braces or Orthoses: Knee Immobilizer - Left Knee Immobilizer - Left: Discontinue once straight leg raise with < 10 degree lag Restrictions Weight Bearing Restrictions: No Other Position/Activity Restrictions: WBAT    Mobility  Bed Mobility Overal bed mobility: Modified Independent Bed Mobility: Supine to Sit;Sit to Supine     Supine to sit: HOB elevated;Modified independent (Device/Increase time) Sit to supine: HOB elevated;Modified independent (Device/Increase time)   General bed mobility comments: able to get his LLE off of bed by itself and back on the bed by hooking his LLE with his RLE  Transfers Overall transfer level: Needs assistance Equipment used: Rolling walker (2 wheeled) Transfers: Sit to/from Stand Sit to Stand: Min guard         General transfer comment: cues for LE management and use of UEs to self assist  Ambulation/Gait Ambulation/Gait assistance: Min guard Ambulation Distance (Feet): 160 Feet Assistive device: Rolling walker (2 wheeled) Gait Pattern/deviations: Step-to pattern;Step-through pattern;Decreased step length - right;Decreased step length - left;Shuffle;Trunk flexed Gait velocity: decr Gait velocity interpretation: Below normal speed for age/gender General Gait Details: min cues for posture, position from RW and sequence   Stairs            Wheelchair Mobility    Modified Rankin (Stroke Patients Only)        Balance Overall balance assessment: Needs assistance Sitting-balance support: No upper extremity supported;Feet supported Sitting balance-Leahy Scale: Good     Standing balance support: Bilateral upper extremity supported;During functional activity Standing balance-Leahy Scale: Poor                              Cognition Arousal/Alertness: Awake/alert Behavior During Therapy: WFL for tasks assessed/performed Overall Cognitive Status: Within Functional Limits for tasks assessed                                        Exercises Total Joint Exercises Ankle Circles/Pumps: AROM;Both;15 reps;Supine Quad Sets: AROM;Both;10 reps;Supine Heel Slides: AAROM;Left;15 reps;Supine Straight Leg Raises: AAROM;Left;10 reps;Supine    General Comments        Pertinent Vitals/Pain Pain Assessment: 0-10 Pain Score: 5  Pain Location: L knee Pain Descriptors / Indicators: Aching;Sore Pain Intervention(s): Limited activity within patient's tolerance;Monitored during session;Premedicated before session    Home Living Family/patient expects to be discharged to:: Private residence Living Arrangements: Spouse/significant other Available Help at Discharge: Family;Available 24 hours/day Type of Home: House Home Access: Stairs to enter   Home Layout: One level Home Equipment: Environmental consultantWalker - 2 wheels;Bedside commode;Cane - single point;Shower seat;Adaptive equipment      Prior Function Level of Independence: Independent          PT Goals (current goals can now be found in the care plan section) Acute Rehab PT Goals Patient Stated Goal: get  back to church and being able to eat out lunch with buddies once a month PT Goal Formulation: With patient Time For Goal Achievement: 12/13/16 Potential to Achieve Goals: Good Progress towards PT goals: Progressing toward goals    Frequency    7X/week      PT Plan Current plan remains appropriate    Co-evaluation               AM-PAC PT "6 Clicks" Daily Activity  Outcome Measure  Difficulty turning over in bed (including adjusting bedclothes, sheets and blankets)?: Total Difficulty moving from lying on back to sitting on the side of the bed? : Total Difficulty sitting down on and standing up from a chair with arms (e.g., wheelchair, bedside commode, etc,.)?: Total Help needed moving to and from a bed to chair (including a wheelchair)?: A Little Help needed walking in hospital room?: A Little Help needed climbing 3-5 steps with a railing? : A Little 6 Click Score: 12    End of Session Equipment Utilized During Treatment: Gait belt;Left knee immobilizer Activity Tolerance: Patient tolerated treatment well Patient left: in bed;with call bell/phone within reach;with family/visitor present Nurse Communication: Mobility status PT Visit Diagnosis: Difficulty in walking, not elsewhere classified (R26.2)     Time: 1610-9604 PT Time Calculation (min) (ACUTE ONLY): 17 min  Charges:  $Gait Training: 8-22 mins $Therapeutic Exercise: 8-22 mins                    G Codes:       Pg [phone]    Chevy Sweigert 12/08/2016, 3:51 PM

## 2016-12-08 NOTE — Op Note (Signed)
NAME:  Isaiah Wilson, Isaiah Wilson NO.:  [id number]  MEDICAL RECORD NO.:  [id number]  LOCATION:                                 FACILITY:  PHYSICIAN:  Ollen Gross, M.D.         DATE OF BIRTH:  DATE OF PROCEDURE:  12/07/2016 DATE OF DISCHARGE:                              OPERATIVE REPORT   PREOPERATIVE DIAGNOSIS:  Loose left total knee arthroplasty.  POSTOPERATIVE DIAGNOSIS:  Loose left total knee arthroplasty.  PROCEDURE:  Left total knee arthroplasty revision.  SURGEON:  Ollen Gross, M.D.  ASSISTANT:  Alexzandrew L. Perkins, PA-C.  ANESTHESIA:  Spinal.  ESTIMATED BLOOD LOSS:  Minimal.  DRAINS:  Hemovac x1.  TOURNIQUET TIME:  Up 42 minutes at 300 mmHg, down 10 minutes, up additional 21 minutes at 300 mmHg.  COMPLICATIONS:  None.  CONDITION:  Stable to recovery.  BRIEF CLINICAL NOTE:  Isaiah Wilson is a 82 year old male, had a left total knee arthroplasty done over 10 years ago.  He had instability and had a polyethylene revision last year to make the knee more stable.  He is more stable, but he is having persistent pain.  A bone scan did show evidence of loosening.  Infection workup was negative.  He presents now for total knee arthroplasty revision.  DESCRIPTION OF PROCEDURE:  After successful administration of spinal anesthetic, a tourniquet was placed on the left thigh and left lower extremity prepped and draped in the usual sterile fashion.  Extremity was wrapped in Esmarch and tourniquet inflated to 300 mmHg.  A midline incision was made with a 10 blade through subcutaneous tissue to the extensor mechanism.  A fresh blade was used to make a medial parapatellar arthrotomy.  Soft tissue of the proximal and medial tibia subperiosteally elevated to the joint line with a knife and into the semimembranosus bursa with a Cobb elevator.  Soft tissue laterally was elevated with attention being paid to avoiding the patellar tendon on tibial tubercle.  I could  not evert the patella and I performed a quadriceps snip which then allowed me to evert the patella.  The knee was then flexed 90 degrees.  Tibial polyethylene was removed with an osteotome from the tibial tray.  The tibia was then subluxed forward and circumferential retractors placed.  An oscillating saw was used to disrupt the interface between the tibial component and bone.  Tibial component was loose and easily extracted.  Cement was removed from the canal and then I thoroughly irrigated the canal and reamed to 13 mm and eventually placed the 13 mm cemented stem.  The extramedullary tibial alignment guide was then placed referencing proximally at the medial aspect of the tibial tubercle and distally along the second metatarsal axis and tibial crest.  The block was pinned to remove about 2 mm off the previous resection level.  Resection was made with an oscillating saw.  Size 5 was the most appropriate tibial component.  The proximal tibia was then prepared with the modular drill and then the modular drill and keel punch.  I prepared for a 29-mm sleeve with the broach. The trial on the tibial side was thus going to be a  size 5 MBT revision tray with 10 mm medial and lateral augments, a 29 mm sleeve and a 13 x 30 stem extension.  The femur was then addressed.  Using osteotomes, the interface between the femoral component and bone was just disrupted and the component removed without any bone loss.  We gained access to the femoral canal distally and thoroughly irrigated the canal.  We used a starter reamer first and reamed up to 18 mm.  18 mm had a good press-fit and was served as an intramedullary alignment guide.  The distal femoral cutting block was then placed over the alignment rod and took 4 mm off the medial and lateral side.  This was in 5 degrees of valgus.  We thus decided to use 4 mm distal augments, medial and lateral.  The size 5 was the most appropriate for the femoral  component also.  The AP cutting block for the size 5 was placed with the rotation marking the epicondylar axis. The spacer block was placed in 90 degrees of flexion to create a rectangular flexion gap and this corresponded also with the rotation of the epicondylar axis.  We pinned the cutting block in this rotation.  I did not get any bone anteriorly or with chamfers.  I had to go up to 8 mm posterolaterally and 4 mm posteromedially to get any bone.  Thus, we used a 4 mm posteromedial augment, 8 mm posterolateral augment, and we also used two 4 mm distal augments.  We then cut for the intercondylar cut for the TC3.  The femoral trial was then created, which is a size 5 TC3 femur in 5 degrees of valgus with 18 x 75 stem extension, 4 mm distal augments, medial and lateral, then 8 mm lateral posterior augment, and a 4 mm medial posterior augment.  The trial had excellent fit.  The tibial trial was also placed with excellent fit.  A 15 mm insert was utilized and full extension was achieved with excellent varus- valgus and anterior-posterior balance throughout full range of motion. The patella was inspected and patelloplasty performed by removing all the soft tissue that was overlying and surrounding the patella.  The component has some mild cold flow, but no evidence of any abnormal wear. It tracks normally. We then released the tourniquet for the first tourniquet time of 42 minutes.  The minor bleeding stopped with electrocautery.  We let the tourniquet down for 8 minutes and the components were assembled on the back table.  Tibial again is a size 5 MBT revision tray with 10 mm medial and lateral augments, 29 mm sleeve, and a 13 x 30 stem extension. On the femoral side, size 5 TC3 femur with 18 x 75 stem in the 5 degree valgus position with 4 mm augments distally medial and lateral and 8 mm posterior lateral augment, a 4 mm posterior medial augment.  After 8 minutes, the leg was rewrapped in  Esmarch and the tourniquet reinflated to 300 mmHg.  The knee was thoroughly irrigated with pulsatile lavage to clean out the debris.  The cement restrictor had previously been placed to the appropriate depth in the tibial canal.  The cement was mixed and once ready for implantation, was injected in the tibial canal and placed on the cut bone surface.  The tibial component was impacted and all extruded cement removed.  On the femoral side, we cemented distally with a press-fit stem.  This was also performed with excellent fit.  A 15 mm  trial inserts placed, knee held in full extension.  All extruded cement removed.  Once the cement was fully hardened, then the permanent 15 mm TC3 rotating platform insert was placed in tibial tray.  Again, there was excellent stability with excellent varus-valgus and anterior- posterior balance throughout full range of motion.  Patella tracked normally.  Wound was copiously irrigated with saline solution.  A 20 mL of Exparel mixed with 60 mL of saline, they were injected into the extensor mechanism, the suprapatellar area, the periosteum of the femur, subcutaneous tissues, and posterior aspect of the knee.  Once cement fully hardened, the permanent 15 mm TC3 rotating platform insert was placed in tibial tray.  Wound was copiously irrigated with saline solution and the arthrotomy closed over Hemovac drain with a Stratafix suture.  Flexion against gravity about 100 degrees.  Tourniquet was released for a time of 106 minutes.  The subcu was then closed over another part of the drain with interrupted 2-0 Vicryl.  The skin was closed with staples.  Drains hooked to suction and the incision cleaned and dried.  Steri-Strips and a bulky sterile dressing were applied.  The patient is awakened and transported to the Recovery in stable condition.  Note that a surgical assistant was a medical necessity for this procedure.  The assistant was necessary for retraction of  vital ligaments and neurovascular structures, also for proper positioning of the limb, for safe and accurate removal of the old implant and safe and accurate placement of the new implant.     Ollen Gross, M.D.   ______________________________ Ollen Gross, M.D.    FA/MEDQ  D:  12/07/2016  T:  12/08/2016  Job:  [id number]

## 2016-12-08 NOTE — Evaluation (Signed)
Physical Therapy Evaluation Patient Details Name: Isaiah RuddJames L Murcia MRN: [id number] DOB: 09/15/1934 Today's Date: 12/08/2016   History of Present Illness  Pt s/p L TKR revision and with hx of Bil TKR   Clinical Impression  Pt s/p L TKR revision and presents with decreased L LE strength/ROM and post op pain limiting functional mobility.  Pt should progress to dc home with family assist.    Follow Up Recommendations Home health PT    Equipment Recommendations  None recommended by PT    Recommendations for Other Services       Precautions / Restrictions Precautions Precautions: Knee;Fall Restrictions Weight Bearing Restrictions: No Other Position/Activity Restrictions: WBAT      Mobility  Bed Mobility Overal bed mobility: Needs Assistance Bed Mobility: Supine to Sit     Supine to sit: Min assist     General bed mobility comments: cues for sequence and min assist for L LE management  Transfers Overall transfer level: Needs assistance Equipment used: Rolling walker (2 wheeled) Transfers: Sit to/from Stand Sit to Stand: Min assist         General transfer comment: cues for LE management and use of UEs to self assist  Ambulation/Gait Ambulation/Gait assistance: Min assist Ambulation Distance (Feet): 60 Feet Assistive device: Rolling walker (2 wheeled) Gait Pattern/deviations: Step-to pattern;Decreased step length - right;Decreased step length - left;Shuffle;Trunk flexed Gait velocity: decr Gait velocity interpretation: Below normal speed for age/gender General Gait Details: cues for posture, position from RW and sequence  Stairs            Wheelchair Mobility    Modified Rankin (Stroke Patients Only)       Balance                                             Pertinent Vitals/Pain Pain Assessment: 0-10 Pain Score: 4  Pain Location: L knee Pain Descriptors / Indicators: Aching;Sore Pain Intervention(s): Limited activity within  patient's tolerance;Monitored during session;Premedicated before session;Ice applied    Home Living Family/patient expects to be discharged to:: Private residence Living Arrangements: Spouse/significant other Available Help at Discharge: Family Type of Home: House Home Access: Stairs to enter   Secretary/administratorntrance Stairs-Number of Steps: 1 Home Layout: One level Home Equipment: Environmental consultantWalker - 2 wheels;Bedside commode;Cane - single point      Prior Function Level of Independence: Independent               Hand Dominance        Extremity/Trunk Assessment   Upper Extremity Assessment Upper Extremity Assessment: Overall WFL for tasks assessed    Lower Extremity Assessment Lower Extremity Assessment: LLE deficits/detail LLE Deficits / Details: Quad strength 2/5 with AAROM at knee -10 - 70       Communication   Communication: HOH  Cognition Arousal/Alertness: Awake/alert Behavior During Therapy: WFL for tasks assessed/performed Overall Cognitive Status: Within Functional Limits for tasks assessed                                        General Comments      Exercises Total Joint Exercises Ankle Circles/Pumps: AROM;Both;15 reps;Supine Quad Sets: AROM;Both;10 reps;Supine Heel Slides: AAROM;Left;15 reps;Supine Straight Leg Raises: AAROM;Left;10 reps;Supine   Assessment/Plan    PT Assessment Patient needs continued PT services  PT Problem List Decreased strength;Decreased range of motion;Decreased activity tolerance;Decreased mobility;Decreased knowledge of use of DME;Pain;Obesity       PT Treatment Interventions DME instruction;Gait training;Stair training;Functional mobility training;Therapeutic activities;Therapeutic exercise;Patient/family education    PT Goals (Current goals can be found in the Care Plan section)  Acute Rehab PT Goals Patient Stated Goal: Regain IND PT Goal Formulation: With patient Time For Goal Achievement: 12/13/16 Potential to  Achieve Goals: Good    Frequency 7X/week   Barriers to discharge        Co-evaluation               AM-PAC PT "6 Clicks" Daily Activity  Outcome Measure Difficulty turning over in bed (including adjusting bedclothes, sheets and blankets)?: Total Difficulty moving from lying on back to sitting on the side of the bed? : Total Difficulty sitting down on and standing up from a chair with arms (e.g., wheelchair, bedside commode, etc,.)?: Total Help needed moving to and from a bed to chair (including a wheelchair)?: A Little Help needed walking in hospital room?: A Little Help needed climbing 3-5 steps with a railing? : A Little 6 Click Score: 12    End of Session Equipment Utilized During Treatment: Gait belt;Left knee immobilizer Activity Tolerance: Patient tolerated treatment well Patient left: in chair;with call bell/phone within reach Nurse Communication: Mobility status PT Visit Diagnosis: Difficulty in walking, not elsewhere classified (R26.2)    Time: 1610-9604 PT Time Calculation (min) (ACUTE ONLY): 32 min   Charges:   PT Evaluation $PT Eval Low Complexity: 1 Procedure PT Treatments $Therapeutic Exercise: 8-22 mins   PT G Codes:        Pg [phone]   Tenae Graziosi 12/08/2016, 12:51 PM

## 2016-12-09 LAB — BASIC METABOLIC PANEL WITH GFR
Anion gap: 6 (ref 5–15)
BUN: 18 mg/dL (ref 6–20)
CO2: 27 mmol/L (ref 22–32)
Calcium: 8.4 mg/dL — ABNORMAL LOW (ref 8.9–10.3)
Chloride: 105 mmol/L (ref 101–111)
Creatinine, Ser: 0.94 mg/dL (ref 0.61–1.24)
GFR calc Af Amer: >60 mL/min
GFR calc non Af Amer: >60 mL/min
Glucose, Bld: 110 mg/dL — ABNORMAL HIGH (ref 65–99)
Potassium: 3.6 mmol/L (ref 3.5–5.1)
Sodium: 138 mmol/L (ref 135–145)

## 2016-12-09 LAB — CBC
HCT: 33.1 % — ABNORMAL LOW (ref 39.0–52.0)
Hemoglobin: 11.1 g/dL — ABNORMAL LOW (ref 13.0–17.0)
MCH: 28.5 pg (ref 26.0–34.0)
MCHC: 33.5 g/dL (ref 30.0–36.0)
MCV: 84.9 fL (ref 78.0–100.0)
Platelets: 164 10*3/uL (ref 150–400)
RBC: 3.9 MIL/uL — ABNORMAL LOW (ref 4.22–5.81)
RDW: 14.5 % (ref 11.5–15.5)
WBC: 14.4 10*3/uL — ABNORMAL HIGH (ref 4.0–10.5)

## 2016-12-09 MED ORDER — RIVAROXABAN 10 MG PO TABS: 10.0000 mg | ORAL_TABLET | Freq: Every day | ORAL | 0 refills | Status: DC

## 2016-12-09 MED ORDER — OXYCODONE HCL 5 MG PO TABS: 5.0000 mg | ORAL_TABLET | ORAL | 0 refills | Status: DC | PRN

## 2016-12-09 MED ORDER — METHOCARBAMOL 500 MG PO TABS: 500.0000 mg | ORAL_TABLET | Freq: Four times a day (QID) | ORAL | 0 refills | Status: DC | PRN

## 2016-12-09 MED ORDER — TRAMADOL HCL 50 MG PO TABS
50.0000 mg | ORAL_TABLET | Freq: Four times a day (QID) | ORAL | 0 refills | Status: DC | PRN
Start: 2016-12-09 — End: 2017-05-11

## 2016-12-09 NOTE — Progress Notes (Signed)
   Subjective: 2 Days Post-Op Procedure(s) (LRB): LEFT TOTAL KNEE REVISION (Left) Patient reports pain as mild.   Patient seen in rounds for Dr. Lequita HaltAluisio. Patient is well, and has had no acute complaints or problems Patient is ready to go home  Objective: Vital signs in last 24 hours: Temp:  [97.8 F (36.6 C)-98.6 F (37 C)] 98.4 F (36.9 C) (07/13 0548) Pulse Rate:  [57-69] 65 (07/13 0548) Resp:  [17-18] 18 (07/13 0548) BP: (138-164)/(64-75) 164/75 (07/13 0548) SpO2:  [94 %-99 %] 99 % (07/13 0548)  Intake/Output from previous day:  Intake/Output Summary (Last 24 hours) at 12/09/16 0945 Last data filed at 12/09/16 0746  Gross per 24 hour  Intake          1290.83 ml  Output             1345 ml  Net           -54.17 ml    Intake/Output this shift: Total I/O In: 120 [P.O.:120] Out: -   Labs:  Recent Labs  12/08/16 0508 12/09/16 0540  HGB 12.0* 11.1*    Recent Labs  12/08/16 0508 12/09/16 0540  WBC 14.5* 14.4*  RBC 4.04* 3.90*  HCT 34.6* 33.1*  PLT 172 164    Recent Labs  12/08/16 0508 12/09/16 0540  NA 138 138  K 4.1 3.6  CL 105 105  CO2 27 27  BUN 11 18  CREATININE 0.92 0.94  GLUCOSE 131* 110*  CALCIUM 8.3* 8.4*   No results for input(s): LABPT, INR in the last 72 hours.  EXAM: General - Patient is Alert, Appropriate and Oriented Extremity - Neurovascular intact Sensation intact distally Intact pulses distally Dorsiflexion/Plantar flexion intact Incision - clean, dry Motor Function - intact, moving foot and toes well on exam.   Assessment/Plan: 2 Days Post-Op Procedure(s) (LRB): LEFT TOTAL KNEE REVISION (Left) Procedure(s) (LRB): LEFT TOTAL KNEE REVISION (Left) Past Medical History:  Diagnosis Date  . Abnormal EKG    hx left anterior fasicular block on old ekg 2017  . Allergic rhinitis   . Allergy   . Barrett's esophagus   . Benign prostatic hypertrophy   . Colitis, ischemic (HCC)    X 2  . Complication of anesthesia    SLOW TO  AWAKEN AFTER 1 SURGERY 20  YRS AGO  . DJD (degenerative joint disease)    SPINE, AND OA  . GERD (gastroesophageal reflux disease)   . HOH (hard of hearing)    BOTH EARS  . Hyperlipidemia   . Hypertension    pt denies says cardura for bladder  . Neuropathy    LEFT HAND NUMB AND BURNS ALL THE TIME  . Overweight(278.02)    Active Problems:   OA (osteoarthritis) of knee  Estimated body mass index is 35.43 kg/m as calculated from the following:   Height as of this encounter: 5\' 11"  (1.803 m).   Weight as of this encounter: 115.2 kg (254 lb). Up with therapy Discharge home with home health Diet - Cardiac diet Follow up - in 2 weeks Activity - WBAT Disposition - Home Condition Upon Discharge - Stable D/C Meds - See DC Summary DVT Prophylaxis - Aspirin and Xarelto  Avel Peacerew Wofford Stratton, PA-C Orthopaedic Surgery 12/09/2016, 9:45 AM

## 2016-12-09 NOTE — Progress Notes (Signed)
Physical Therapy Treatment Patient Details Name: Isaiah Wilson MRN: [id number] DOB: 1934/12/03 Today's Date: 12/09/2016    History of Present Illness Pt s/p L TKR revision (this is 3rd one on this knee)and with hx of R TKR     PT Comments    POD #1 am session Applied KI and instructed on use for long walks and one step into home.  Assisted with amb in hallway then back to bed due to increased pain and fatigue.  Pt will need another PT session to address TE's.   Follow Up Recommendations  Home health PT     Equipment Recommendations  None recommended by PT    Recommendations for Other Services       Precautions / Restrictions Precautions Precautions: Knee;Fall Precaution Comments: instructed on KI use and proper application Required Braces or Orthoses: Knee Immobilizer - Left Knee Immobilizer - Left: Discontinue once straight leg raise with < 10 degree lag Restrictions Weight Bearing Restrictions: No Other Position/Activity Restrictions: WBAT    Mobility  Bed Mobility Overal bed mobility: Modified Independent             General bed mobility comments: able to get his LLE off of bed by itself and back on the bed by hooking his LLE with his RLE  Transfers Overall transfer level: Needs assistance Equipment used: Rolling walker (2 wheeled) Transfers: Sit to/from Stand Sit to Stand: Supervision         General transfer comment: good safety cognition and use of hands to steady self  Ambulation/Gait Ambulation/Gait assistance: Supervision;Min guard Ambulation Distance (Feet): 85 Feet Assistive device: Rolling walker (2 wheeled) Gait Pattern/deviations: Step-to pattern;Step-through pattern;Decreased step length - right;Decreased step length - left;Shuffle;Trunk flexed Gait velocity: decr   General Gait Details: 25% VC's on proper walker to self distance and safety with turns   Stairs Stairs: Yes   Stair Management: No rails;Step to pattern;With  walker Number of Stairs: 1 General stair comments: 25% VC's on proper walker placeemnt and proper sequencing as well as safety.  Advised to wear KI for increased support.   Wheelchair Mobility    Modified Rankin (Stroke Patients Only)       Balance                                            Cognition Arousal/Alertness: Awake/alert Behavior During Therapy: WFL for tasks assessed/performed Overall Cognitive Status: Within Functional Limits for tasks assessed                                        Exercises      General Comments        Pertinent Vitals/Pain Pain Assessment: 0-10 Pain Score: 6  Pain Location: L knee Pain Descriptors / Indicators: Aching;Sore;Operative site guarding Pain Intervention(s): Monitored during session;Premedicated before session;Repositioned;Ice applied    Home Living                      Prior Function            PT Goals (current goals can now be found in the care plan section) Progress towards PT goals: Progressing toward goals    Frequency    7X/week      PT Plan Current plan remains appropriate  Co-evaluation              AM-PAC PT "6 Clicks" Daily Activity  Outcome Measure  Difficulty turning over in bed (including adjusting bedclothes, sheets and blankets)?: Total Difficulty moving from lying on back to sitting on the side of the bed? : Total Difficulty sitting down on and standing up from a chair with arms (e.g., wheelchair, bedside commode, etc,.)?: Total Help needed moving to and from a bed to chair (including a wheelchair)?: A Lot Help needed walking in hospital room?: A Lot Help needed climbing 3-5 steps with a railing? : Total 6 Click Score: 8    End of Session Equipment Utilized During Treatment: Gait belt;Left knee immobilizer Activity Tolerance: Patient tolerated treatment well Patient left: in bed;with call bell/phone within reach Nurse Communication:  Mobility status PT Visit Diagnosis: Unsteadiness on feet (R26.81)     Time: [id number] PT Time Calculation (min) (ACUTE ONLY): 23 min  Charges:  $Gait Training: 8-22 mins $Therapeutic Activity: 8-22 mins                    G Codes:       Felecia ShellingLori Mysty Kielty  PTA [facility]  Acute  Rehab Pager      [phone]

## 2016-12-09 NOTE — Progress Notes (Signed)
Physical Therapy Treatment Patient Details Name: Isaiah Wilson MRN: [id number] DOB: 05/20/1935 Today's Date: 12/09/2016    History of Present Illness Pt s/p L TKR revision (this is 3rd one on this knee)and with hx of R TKR     PT Comments    POD # 1 pm session Spouse present for education on TE's, use of ice and KI.  Following HEP handout, pt performed all supine TKR TE's.  Instructed on proper tech, freq as well as use of ICE.  Addressed all mobility questions.  Pt ready for D/C to home.    Follow Up Recommendations  Home health PT     Equipment Recommendations  None recommended by PT    Recommendations for Other Services       Precautions / Restrictions Precautions Precautions: Knee;Fall Precaution Comments: instructed on KI use and proper application Required Braces or Orthoses: Knee Immobilizer - Left Knee Immobilizer - Left: Discontinue once straight leg raise with < 10 degree lag Restrictions Weight Bearing Restrictions: No Other Position/Activity Restrictions: WBAT       Balance                                            Cognition Arousal/Alertness: Awake/alert Behavior During Therapy: WFL for tasks assessed/performed Overall Cognitive Status: Within Functional Limits for tasks assessed                                        Exercises   Total Knee Replacement TE's 10 reps B LE ankle pumps 10 reps towel squeezes 10 reps knee presses 10 reps heel slides  10 reps SAQ's 10 reps SLR's 10 reps ABD Followed by ICE     General Comments        Pertinent Vitals/Pain Pain Assessment: 0-10 Pain Score: 6  Pain Location: L knee Pain Descriptors / Indicators: Aching;Sore;Operative site guarding Pain Intervention(s): Monitored during session;Premedicated before session;Repositioned;Ice applied    Home Living                      Prior Function            PT Goals (current goals can now be found in the care  plan section) Progress towards PT goals: Progressing toward goals    Frequency    7X/week      PT Plan Current plan remains appropriate    Co-evaluation              AM-PAC PT "6 Clicks" Daily Activity  Outcome Measure  Difficulty turning over in bed (including adjusting bedclothes, sheets and blankets)?: Total Difficulty moving from lying on back to sitting on the side of the bed? : Total Difficulty sitting down on and standing up from a chair with arms (e.g., wheelchair, bedside commode, etc,.)?: Total Help needed moving to and from a bed to chair (including a wheelchair)?: A Lot Help needed walking in hospital room?: A Lot Help needed climbing 3-5 steps with a railing? : Total 6 Click Score: 8    End of Session Equipment Utilized During Treatment: Gait belt;Left knee immobilizer Activity Tolerance: Patient tolerated treatment well Patient left: in bed;with call bell/phone within reach Nurse Communication: Mobility status PT Visit Diagnosis: Unsteadiness on feet (R26.81)     Time: [id number]  PT Time Calculation (min) (ACUTE ONLY): 25 min  Charges:   $Therapeutic Exercise: 8-22 mins $Self Care/Home Management: 8-22                    G Codes:       {Bobbi Yount  PTA [facility]  Acute  Rehab Pager      [phone]

## 2016-12-09 NOTE — Discharge Summary (Signed)
Physician Discharge Summary   Patient ID: Isaiah Wilson MRN: [id number] DOB/AGE: 1934-08-04 82 y.o.  Admit date: 12/07/2016 Discharge date: 12/09/2016  Primary Diagnosis:  Loose left total knee arthroplasty.  Admission Diagnoses:  Past Medical History:  Diagnosis Date  . Abnormal EKG    hx left anterior fasicular block on old ekg 2017  . Allergic rhinitis   . Allergy   . Barrett's esophagus   . Benign prostatic hypertrophy   . Colitis, ischemic ([place])    X 2  . Complication of anesthesia    SLOW TO AWAKEN AFTER 1 SURGERY 20  YRS AGO  . DJD (degenerative joint disease)    SPINE, AND OA  . GERD (gastroesophageal reflux disease)   . HOH (hard of hearing)    BOTH EARS  . Hyperlipidemia   . Hypertension    pt denies says cardura for bladder  . Neuropathy    LEFT HAND NUMB AND BURNS ALL THE TIME  . Overweight(278.02)    Discharge Diagnoses:   Active Problems:   OA (osteoarthritis) of knee  Estimated body mass index is 35.43 kg/m as calculated from the following:   Height as of this encounter: '5\' 11"'$  (1.803 m).   Weight as of this encounter: 115.2 kg (254 lb).  Procedure:  Procedure(s) (LRB): LEFT TOTAL KNEE REVISION (Left)   Consults: None  HPI: Isaiah Wilson is a 82 year old male, had a left total knee arthroplasty done over 10 years ago.  He had instability and had a polyethylene revision last year to make the knee more stable.  He is more stable, but he is having persistent pain.  A bone scan did show evidence of loosening.  Infection workup was negative.  He presents now for total knee arthroplasty revision. Laboratory Data: Admission on 12/07/2016  Component Date Value Ref Range Status  . WBC 12/08/2016 14.5* 4.0 - 10.5 K/uL Final  . RBC 12/08/2016 4.04* 4.22 - 5.81 MIL/uL Final  . Hemoglobin 12/08/2016 12.0* 13.0 - 17.0 g/dL Final  . HCT 12/08/2016 34.6* 39.0 - 52.0 % Final  . MCV 12/08/2016 85.6  78.0 - 100.0 fL Final  . MCH 12/08/2016 29.7  26.0 - 34.0  pg Final  . MCHC 12/08/2016 34.7  30.0 - 36.0 g/dL Final  . RDW 12/08/2016 14.2  11.5 - 15.5 % Final  . Platelets 12/08/2016 172  150 - 400 K/uL Final  . Sodium 12/08/2016 138  135 - 145 mmol/L Final  . Potassium 12/08/2016 4.1  3.5 - 5.1 mmol/L Final  . Chloride 12/08/2016 105  101 - 111 mmol/L Final  . CO2 12/08/2016 27  22 - 32 mmol/L Final  . Glucose, Bld 12/08/2016 131* 65 - 99 mg/dL Final  . BUN 12/08/2016 11  6 - 20 mg/dL Final  . Creatinine, Ser 12/08/2016 0.92  0.61 - 1.24 mg/dL Final  . Calcium 12/08/2016 8.3* 8.9 - 10.3 mg/dL Final  . GFR calc non Af Amer 12/08/2016 >60  >60 mL/min Final  . GFR calc Af Amer 12/08/2016 >60  >60 mL/min Final   Comment: (NOTE) The eGFR has been calculated using the CKD EPI equation. This calculation has not been validated in all clinical situations. eGFR's persistently <60 mL/min signify possible Chronic Kidney Disease.   . Anion gap 12/08/2016 6  5 - 15 Final  . WBC 12/09/2016 14.4* 4.0 - 10.5 K/uL Final  . RBC 12/09/2016 3.90* 4.22 - 5.81 MIL/uL Final  . Hemoglobin 12/09/2016 11.1* 13.0 - 17.0 g/dL Final  .  HCT 12/09/2016 33.1* 39.0 - 52.0 % Final  . MCV 12/09/2016 84.9  78.0 - 100.0 fL Final  . MCH 12/09/2016 28.5  26.0 - 34.0 pg Final  . MCHC 12/09/2016 33.5  30.0 - 36.0 g/dL Final  . RDW 12/09/2016 14.5  11.5 - 15.5 % Final  . Platelets 12/09/2016 164  150 - 400 K/uL Final  . Sodium 12/09/2016 138  135 - 145 mmol/L Final  . Potassium 12/09/2016 3.6  3.5 - 5.1 mmol/L Final  . Chloride 12/09/2016 105  101 - 111 mmol/L Final  . CO2 12/09/2016 27  22 - 32 mmol/L Final  . Glucose, Bld 12/09/2016 110* 65 - 99 mg/dL Final  . BUN 12/09/2016 18  6 - 20 mg/dL Final  . Creatinine, Ser 12/09/2016 0.94  0.61 - 1.24 mg/dL Final  . Calcium 12/09/2016 8.4* 8.9 - 10.3 mg/dL Final  . GFR calc non Af Amer 12/09/2016 >60  >60 mL/min Final  . GFR calc Af Amer 12/09/2016 >60  >60 mL/min Final   Comment: (NOTE) The eGFR has been calculated using the  CKD EPI equation. This calculation has not been validated in all clinical situations. eGFR's persistently <60 mL/min signify possible Chronic Kidney Disease.   Georgiann Hahn gap 12/09/2016 6  5 - 15 Final  Hospital Outpatient Visit on 12/01/2016  Component Date Value Ref Range Status  . aPTT 12/01/2016 32  24 - 36 seconds Final  . Prothrombin Time 12/01/2016 13.3  11.4 - 15.2 seconds Final  . INR 12/01/2016 1.01   Final  . ABO/RH(D) 12/01/2016 A POS   Final  . Antibody Screen 12/01/2016 NEG   Final  . Sample Expiration 12/01/2016 12/10/2016   Final  . Extend sample reason 12/01/2016 NO TRANSFUSIONS OR PREGNANCY IN THE PAST 3 MONTHS   Final  . MRSA, PCR 12/01/2016 NEGATIVE  NEGATIVE Final  . Staphylococcus aureus 12/01/2016 NEGATIVE  NEGATIVE Final   Comment:        The Xpert SA Assay (FDA approved for NASAL specimens in patients over 24 years of age), is one component of a comprehensive surveillance program.  Test performance has been validated by [facility] for patients greater than or equal to 83 year old. It is not intended to diagnose infection nor to guide or monitor treatment.   Office Visit on 11/16/2016  Component Date Value Ref Range Status  . Sodium 11/16/2016 142  135 - 145 mEq/L Final  . Potassium 11/16/2016 4.1  3.5 - 5.1 mEq/L Final  . Chloride 11/16/2016 107  96 - 112 mEq/L Final  . CO2 11/16/2016 26  19 - 32 mEq/L Final  . Glucose, Bld 11/16/2016 113* 70 - 99 mg/dL Final  . BUN 11/16/2016 15  6 - 23 mg/dL Final  . Creatinine, Ser 11/16/2016 1.03  0.40 - 1.50 mg/dL Final  . Calcium 11/16/2016 9.2  8.4 - 10.5 mg/dL Final  . GFR 11/16/2016 73.93  >60.00 mL/min Final  . Cholesterol 11/16/2016 169  0 - 200 mg/dL Final   ATP III Classification       Desirable:  < 200 mg/dL               Borderline High:  200 - 239 mg/dL          High:  > = 240 mg/dL  . Triglycerides 11/16/2016 211.0* 0.0 - 149.0 mg/dL Final   Normal:  <150 mg/dLBorderline High:  150 - 199 mg/dL  .  HDL 11/16/2016 34.20* >39.00 mg/dL Final  .  VLDL 11/16/2016 42.2* 0.0 - 40.0 mg/dL Final  . Total CHOL/HDL Ratio 11/16/2016 5   Final                  Men          Women1/2 Average Risk     3.4          3.3Average Risk          5.0          4.42X Average Risk          9.6          7.13X Average Risk          15.0          11.0                      . NonHDL 11/16/2016 135.11   Final   NOTE:  Non-HDL goal should be 30 mg/dL higher than patient's LDL goal (i.e. LDL goal of < 70 mg/dL, would have non-HDL goal of < 100 mg/dL)  . WBC 11/16/2016 8.8  4.0 - 10.5 K/uL Final  . RBC 11/16/2016 4.71  4.22 - 5.81 Mil/uL Final  . Hemoglobin 11/16/2016 14.0  13.0 - 17.0 g/dL Final  . HCT 11/16/2016 41.4  39.0 - 52.0 % Final  . MCV 11/16/2016 87.7  78.0 - 100.0 fl Final  . MCHC 11/16/2016 33.8  30.0 - 36.0 g/dL Final  . RDW 11/16/2016 14.2  11.5 - 15.5 % Final  . Platelets 11/16/2016 198.0  150.0 - 400.0 K/uL Final  . Neutrophils Relative % 11/16/2016 60.9  43.0 - 77.0 % Final  . Lymphocytes Relative 11/16/2016 27.7  12.0 - 46.0 % Final  . Monocytes Relative 11/16/2016 8.3  3.0 - 12.0 % Final  . Eosinophils Relative 11/16/2016 2.3  0.0 - 5.0 % Final  . Basophils Relative 11/16/2016 0.8  0.0 - 3.0 % Final  . Neutro Abs 11/16/2016 5.3  1.4 - 7.7 K/uL Final  . Lymphs Abs 11/16/2016 2.4  0.7 - 4.0 K/uL Final  . Monocytes Absolute 11/16/2016 0.7  0.1 - 1.0 K/uL Final  . Eosinophils Absolute 11/16/2016 0.2  0.0 - 0.7 K/uL Final  . Basophils Absolute 11/16/2016 0.1  0.0 - 0.1 K/uL Final  . TSH 11/16/2016 3.74  0.35 - 4.50 uIU/mL Final  . Total Bilirubin 11/16/2016 0.6  0.2 - 1.2 mg/dL Final  . Bilirubin, Direct 11/16/2016 0.1  0.0 - 0.3 mg/dL Final  . Alkaline Phosphatase 11/16/2016 54  39 - 117 U/L Final  . AST 11/16/2016 29  0 - 37 U/L Final  . ALT 11/16/2016 30  0 - 53 U/L Final  . Total Protein 11/16/2016 6.7  6.0 - 8.3 g/dL Final  . Albumin 11/16/2016 4.3  3.5 - 5.2 g/dL Final  . Direct LDL 11/16/2016  101.0  mg/dL Final   Optimal:  <100 mg/dLNear or Above Optimal:  100-129 mg/dLBorderline High:  130-159 mg/dLHigh:  160-189 mg/dLVery High:  >190 mg/dL     X-Rays:No results found.  EKG: Orders placed or performed during the hospital encounter of 12/01/16  . EKG 12 lead  . EKG 12 lead     Hospital Course: Isaiah Wilson is a 82 y.o. who was admitted to [facility]. They were brought to the operating room on 12/07/2016 and underwent Procedure(s): LEFT TOTAL KNEE REVISION.  Patient tolerated the procedure well and was later transferred to the recovery room and  then to the orthopaedic floor for postoperative care.  They were given PO and IV analgesics for pain control following their surgery.  They were given 24 hours of postoperative antibiotics of  Anti-infectives    Start     Dose/Rate Route Frequency Ordered Stop   12/07/16 1930  ceFAZolin (ANCEF) IVPB 2g/100 mL premix     2 g 200 mL/hr over 30 Minutes Intravenous Every 6 hours 12/07/16 1824 12/08/16 0150   12/07/16 1110  ceFAZolin (ANCEF) 2-4 GM/100ML-% IVPB    Comments:  Algis Liming   : cabinet override      12/07/16 1110 12/07/16 1324   12/07/16 1109  ceFAZolin (ANCEF) IVPB 2g/100 mL premix     2 g 200 mL/hr over 30 Minutes Intravenous On call to O.R. 12/07/16 1109 12/07/16 1324     and started on DVT prophylaxis in the form of Xarelto.   PT and OT were ordered for total joint protocol.  Discharge planning consulted to help with postop disposition and equipment needs.  Patient had a decent night on the evening of surgery.  They started to get up OOB with therapy on day one. Hemovac drain was pulled without difficulty.  Continued to work with therapy into day two.  Dressing was changed on day two and the incision was healing well. Patient was seen in rounds and was ready to go home on POD 2.  Discharge home with home health Diet - Cardiac diet Follow up - in 2 weeks Activity - WBAT Disposition - Home Condition Upon  Discharge - Stable D/C Meds - See DC Summary DVT Prophylaxis - Aspirin and Xarelto   Discharge Instructions    Call MD / Call 911    Complete by:  As directed    If you experience chest pain or shortness of breath, CALL 911 and be transported to the hospital emergency room.  If you develope a fever above 101 F, pus (white drainage) or increased drainage or redness at the wound, or calf pain, call your surgeon's office.   Change dressing    Complete by:  As directed    Change dressing daily with sterile 4 x 4 inch gauze dressing and apply TED hose. Do not submerge the incision under water.   Constipation Prevention    Complete by:  As directed    Drink plenty of fluids.  Prune juice may be helpful.  You may use a stool softener, such as Colace (over the counter) 100 mg twice a day.  Use MiraLax (over the counter) for constipation as needed.   Diet - low sodium heart healthy    Complete by:  As directed    Discharge instructions    Complete by:  As directed    Take Xarelto for two and a half more weeks, then discontinue Xarelto. Once the patient has completed the Xarelto, they may resume the 81 mg Aspirin.   Pick up stool softner and laxative for home use following surgery while on pain medications. Do not submerge incision under water. Please use good hand washing techniques while changing dressing each day. May shower starting three days after surgery. Please use a clean towel to pat the incision dry following showers. Continue to use ice for pain and swelling after surgery. Do not use any lotions or creams on the incision until instructed by your surgeon.  Wear both TED hose on both legs during the day every day for three weeks, but may remove the TED hose at night  at home.  Postoperative Constipation Protocol  Constipation - defined medically as fewer than three stools per week and severe constipation as less than one stool per week.  One of the most common issues patients have  following surgery is constipation.  Even if you have a regular bowel pattern at home, your normal regimen is likely to be disrupted due to multiple reasons following surgery.  Combination of anesthesia, postoperative narcotics, change in appetite and fluid intake all can affect your bowels.  In order to avoid complications following surgery, here are some recommendations in order to help you during your recovery period.  Colace (docusate) - Pick up an over-the-counter form of Colace or another stool softener and take twice a day as long as you are requiring postoperative pain medications.  Take with a full glass of water daily.  If you experience loose stools or diarrhea, hold the colace until you stool forms back up.  If your symptoms do not get better within 1 week or if they get worse, check with your doctor.  Dulcolax (bisacodyl) - Pick up over-the-counter and take as directed by the product packaging as needed to assist with the movement of your bowels.  Take with a full glass of water.  Use this product as needed if not relieved by Colace only.   MiraLax (polyethylene glycol) - Pick up over-the-counter to have on hand.  MiraLax is a solution that will increase the amount of water in your bowels to assist with bowel movements.  Take as directed and can mix with a glass of water, juice, soda, coffee, or tea.  Take if you go more than two days without a movement. Do not use MiraLax more than once per day. Call your doctor if you are still constipated or irregular after using this medication for 7 days in a row.  If you continue to have problems with postoperative constipation, please contact the office for further assistance and recommendations.  If you experience "the worst abdominal pain ever" or develop nausea or vomiting, please contact the office immediatly for further recommendations for treatment.   Do not put a pillow under the knee. Place it under the heel.    Complete by:  As directed    Do  not sit on low chairs, stoools or toilet seats, as it may be difficult to get up from low surfaces    Complete by:  As directed    Driving restrictions    Complete by:  As directed    No driving until released by the physician.   Increase activity slowly as tolerated    Complete by:  As directed    Lifting restrictions    Complete by:  As directed    No lifting until released by the physician.   Patient may shower    Complete by:  As directed    You may shower without a dressing once there is no drainage.  Do not wash over the wound.  If drainage remains, do not shower until drainage stops.   TED hose    Complete by:  As directed    Use stockings (TED hose) for 3 weeks on both leg(s).  You may remove them at night for sleeping.   Weight bearing as tolerated    Complete by:  As directed    Laterality:  left   Extremity:  Lower     Allergies as of 12/09/2016   No Known Allergies     Medication List  STOP taking these medications   aspirin EC 81 MG tablet   CENTRUM ADULTS Tabs   UNABLE TO FIND     TAKE these medications   acetaminophen 650 MG CR tablet Commonly known as:  TYLENOL Take 1,300 mg by mouth every 8 (eight) hours as needed for pain.   azelastine 0.1 % nasal spray Commonly known as:  ASTELIN Place 2 sprays into both nostrils every evening. Use in each nostril as directed   doxazosin 8 MG tablet Commonly known as:  CARDURA Take 1 tablet (8 mg total) by mouth at bedtime.   EPINEPHrine 0.3 mg/0.3 mL Soaj injection Commonly known as:  EPI-PEN Inject 0.3 mg into the muscle as needed (allergic reaction).   fluticasone 50 MCG/ACT nasal spray Commonly known as:  FLONASE Place 2 sprays into both nostrils daily.   loratadine 10 MG tablet Commonly known as:  CLARITIN Take 10 mg by mouth every evening.   methocarbamol 500 MG tablet Commonly known as:  ROBAXIN Take 1 tablet (500 mg total) by mouth every 6 (six) hours as needed for muscle spasms.     montelukast 10 MG tablet Commonly known as:  SINGULAIR Take 10 mg by mouth at bedtime.   NEXIUM 40 MG capsule Generic drug:  esomeprazole TAKE 1 CAPSULE (40 MG TOTAL) BY MOUTH DAILY BEFORE BREAKFAST. What changed:  when to take this   oxyCODONE 5 MG immediate release tablet Commonly known as:  Oxy IR/ROXICODONE Take 1-2 tablets (5-10 mg total) by mouth every 4 (four) hours as needed for moderate pain or severe pain.   rivaroxaban 10 MG Tabs tablet Commonly known as:  XARELTO Take 1 tablet (10 mg total) by mouth daily with breakfast. Take Xarelto for two and a half more weeks following discharge from the hospital, then discontinue Xarelto. Once the patient has completed the Xarelto, they may resume the 81 mg Aspirin.   traMADol 50 MG tablet Commonly known as:  ULTRAM Take 1-2 tablets (50-100 mg total) by mouth every 6 (six) hours as needed for moderate pain.   zinc gluconate 50 MG tablet Take 50 mg by mouth daily.      Follow-up Information    [facility] Follow up.   Specialty:  [place] Why:  physical therapy Contact information: [place] [address] [phone]        Gaynelle Arabian, MD. Schedule an appointment as soon as possible for a visit on 12/20/2016.   Specialty:  Orthopedic Surgery Contact information: [address] [place] [address] [phone]           Signed: Arlee Muslim, PA-C Orthopaedic Surgery 12/09/2016, 9:51 AM

## 2017-02-08 ENCOUNTER — Other Ambulatory Visit: Payer: Self-pay | Admitting: Family Medicine

## 2017-03-06 ENCOUNTER — Ambulatory Visit (INDEPENDENT_AMBULATORY_CARE_PROVIDER_SITE_OTHER): Payer: Medicare Other | Admitting: *Deleted

## 2017-03-06 DIAGNOSIS — Z23 Encounter for immunization: Secondary | ICD-10-CM

## 2017-03-29 ENCOUNTER — Ambulatory Visit (INDEPENDENT_AMBULATORY_CARE_PROVIDER_SITE_OTHER): Payer: Medicare Other | Admitting: Podiatry

## 2017-03-29 DIAGNOSIS — B351 Tinea unguium: Secondary | ICD-10-CM | POA: Diagnosis not present

## 2017-03-29 DIAGNOSIS — D689 Coagulation defect, unspecified: Secondary | ICD-10-CM

## 2017-03-29 DIAGNOSIS — M216X9 Other acquired deformities of unspecified foot: Secondary | ICD-10-CM | POA: Diagnosis not present

## 2017-03-29 NOTE — Progress Notes (Signed)
   Subjective:    Patient ID: Isaiah Wilson, male    DOB: 10/10/1934, 82 y.o.   MRN: [id number]  HPI    Review of Systems  All other systems reviewed and are negative.      Objective:   Physical Exam        Assessment & Plan:

## 2017-03-29 NOTE — Progress Notes (Signed)
Subjective:    Patient ID: Isaiah Wilson, male   DOB: 82 y.o.   MRN: [id number]   HPI patient presents with nail disease 1-5 both feet and states he cannot take care of them and has a thickened second nail right and also they become painful and patient is on blood thinner    Review of Systems  All other systems reviewed and are negative.       Objective:  Physical Exam  Constitutional: He appears well-developed and well-nourished.  Cardiovascular: Intact distal pulses.   Pulmonary/Chest: Breath sounds normal.  Musculoskeletal: Normal range of motion.  Neurological: He is alert.  Skin: Skin is warm and dry.  Nursing note and vitals reviewed.  neurovascular status intact muscle strength was found to be adequate with patient being on blood thinners and at risk. He has nail disease 1-5 both feet that are thick incurvated the corners and do get mycotic     Assessment:   At risk patient with blood disorder and taking Isaiah Wilson who has nail disease with thickness brittle type debris and pain 1-5 bilateral      Plan:   H&P condition reviewed and recommended nail debridement. Today I went ahead and debrided nailbeds 1-5 both feet with no iatrogenic bleeding and reappoint for routine care

## 2017-03-29 NOTE — Progress Notes (Signed)
   Subjective:    Patient ID: Isaiah Wilson, male    DOB: 03/08/1935, 82 y.o.   MRN: [id number]  HPI    Review of Systems     Objective:   Physical Exam        Assessment & Plan:

## 2017-04-20 ENCOUNTER — Other Ambulatory Visit: Payer: Self-pay | Admitting: Family Medicine

## 2017-05-11 ENCOUNTER — Encounter: Payer: Self-pay | Admitting: Family Medicine

## 2017-05-11 ENCOUNTER — Ambulatory Visit: Payer: Medicare Other | Admitting: Family Medicine

## 2017-05-11 VITALS — BP 130/70 | HR 76 | Temp 99.0°F

## 2017-05-11 DIAGNOSIS — R05 Cough: Secondary | ICD-10-CM | POA: Diagnosis not present

## 2017-05-11 DIAGNOSIS — R059 Cough, unspecified: Secondary | ICD-10-CM

## 2017-05-11 MED ORDER — HYDROCODONE-HOMATROPINE 5-1.5 MG/5ML PO SYRP: 5.0000 mL | ORAL_SOLUTION | Freq: Four times a day (QID) | ORAL | 0 refills | Status: DC | PRN

## 2017-05-11 NOTE — Progress Notes (Signed)
Subjective:     Patient ID: Isaiah Wilson, male   DOB: 08/24/1934, 82 y.o.   MRN: [id number]  HPI Patient seen for acute visit. Nonsmoker who seen with 4 day history of mild sore throat, nasal congestion, dry cough. Cough has been severe at night. He has taken NyQuil, DayQuil, and some cough drops without much improvement. Interfering with sleep. No dyspnea. No fever. No known sick contacts.  Past Medical History:  Diagnosis Date  . Abnormal EKG    hx left anterior fasicular block on old ekg 2017  . Allergic rhinitis   . Allergy   . Barrett's esophagus   . Benign prostatic hypertrophy   . Colitis, ischemic (HCC)    X 2  . Complication of anesthesia    SLOW TO AWAKEN AFTER 1 SURGERY 20  YRS AGO  . DJD (degenerative joint disease)    SPINE, AND OA  . GERD (gastroesophageal reflux disease)   . HOH (hard of hearing)    BOTH EARS  . Hyperlipidemia   . Hypertension    pt denies says cardura for bladder  . Neuropathy    LEFT HAND NUMB AND BURNS ALL THE TIME  . Overweight(278.02)    Past Surgical History:  Procedure Laterality Date  . 2 neck surgeries  2005-2006   Dr. Georgia DomHirsh--ant cx decompression, diskectomy,fusion C6-7 allograftWITH PLATE  . APPENDECTOMY    . FOOT SURGERY     right x 3  . LAPAROSCOPIC CHOLECYSTECTOMY  1993   Dr. Luan MooreP. Young  . left total knee replacement  07/2007   Dr. Eulah Pontmurphy  . right total knee replacement  2012   Dr. Despina HickAlusio  . SEPTOPLASTY  1975, May 2013   x3  . TONSILLECTOMY  as child  . TOTAL KNEE REVISION Left 11/11/2015   Procedure: LEFT KNEE POLYETHELENE REVISION;  Surgeon: Ollen GrossFrank Aluisio, MD;  Location: [facility];  Service: Orthopedics;  Laterality: Left;  . TOTAL KNEE REVISION Left 12/07/2016   Procedure: LEFT TOTAL KNEE REVISION;  Surgeon: Ollen GrossAluisio, Frank, MD;  Location: [facility];  Service: Orthopedics;  Laterality: Left;  Adductor Block    reports that  has never smoked. he has never used smokeless tobacco. He reports that he does not drink alcohol or use  drugs. family history is not on file. No Known Allergies   Review of Systems  Constitutional: Negative for chills and fever.  HENT: Positive for congestion and sore throat.   Respiratory: Positive for cough. Negative for shortness of breath and wheezing.   Cardiovascular: Negative for chest pain.       Objective:   Physical Exam  Constitutional: He appears well-developed and well-nourished.  HENT:  Right Ear: External ear normal.  Left Ear: External ear normal.  Mouth/Throat: Oropharynx is clear and moist.  Neck: Neck supple.  Cardiovascular: Normal rate and regular rhythm.  Pulmonary/Chest: Effort normal and breath sounds normal. No respiratory distress. He has no wheezes. He has no rales.  Lymphadenopathy:    He has no cervical adenopathy.       Assessment:     Cough. Suspect related to acute viral infection. Nonfocal exam    Plan:     -Hycodan cough syrup 1 teaspoon daily at bedtime as needed for severe cough -Follow-up promptly for any fever, increasing shortness of breath, or other concerns  Kristian CoveyBruce W Burchette MD [facility]

## 2017-05-11 NOTE — Patient Instructions (Signed)
Suspect you have a viral infection Follow up promptly for any fever or increased shortness of breath.

## 2017-05-19 ENCOUNTER — Telehealth: Payer: Self-pay | Admitting: Family Medicine

## 2017-05-19 MED ORDER — HYDROCODONE-HOMATROPINE 5-1.5 MG/5ML PO SYRP: 5.0000 mL | ORAL_SOLUTION | Freq: Four times a day (QID) | ORAL | 0 refills | Status: AC | PRN

## 2017-05-19 NOTE — Telephone Encounter (Signed)
Refill request for Hycodan cough syrup / LOV 05/11/17 with R. Burchette /

## 2017-05-19 NOTE — Telephone Encounter (Signed)
Copied from CRM [phone]#[id number]. Topic: General - Other >> May 19, 2017  9:06 AM Stephannie LiSimmons, Averly Ericson L, NT wrote: Reason for CRM: Patient would like a new prescription for hydrocodone cough medicine ,per the pharmacist please call  [phone] please leave a detailed message as to whether this can be done

## 2017-05-19 NOTE — Telephone Encounter (Signed)
Refill once but try to avoid regular use.

## 2017-05-19 NOTE — Telephone Encounter (Signed)
rx ready for pick up and patient is aware  

## 2017-05-31 DIAGNOSIS — M25562 Pain in left knee: Secondary | ICD-10-CM | POA: Insufficient documentation

## 2017-06-13 DIAGNOSIS — Z96651 Presence of right artificial knee joint: Secondary | ICD-10-CM | POA: Insufficient documentation

## 2017-06-18 DIAGNOSIS — Z96653 Presence of artificial knee joint, bilateral: Secondary | ICD-10-CM | POA: Insufficient documentation

## 2017-06-20 DIAGNOSIS — H6502 Acute serous otitis media, left ear: Secondary | ICD-10-CM | POA: Insufficient documentation

## 2017-06-20 DIAGNOSIS — H900 Conductive hearing loss, bilateral: Secondary | ICD-10-CM | POA: Insufficient documentation

## 2017-06-30 ENCOUNTER — Encounter: Payer: Self-pay | Admitting: Podiatry

## 2017-06-30 ENCOUNTER — Ambulatory Visit (INDEPENDENT_AMBULATORY_CARE_PROVIDER_SITE_OTHER): Payer: Medicare Other | Admitting: Podiatry

## 2017-06-30 DIAGNOSIS — B351 Tinea unguium: Secondary | ICD-10-CM

## 2017-06-30 DIAGNOSIS — D689 Coagulation defect, unspecified: Secondary | ICD-10-CM

## 2017-06-30 DIAGNOSIS — M216X9 Other acquired deformities of unspecified foot: Secondary | ICD-10-CM

## 2017-07-19 ENCOUNTER — Other Ambulatory Visit: Payer: Self-pay | Admitting: Family Medicine

## 2017-07-20 ENCOUNTER — Other Ambulatory Visit: Payer: Self-pay | Admitting: Family Medicine

## 2017-07-21 ENCOUNTER — Other Ambulatory Visit: Payer: Self-pay | Admitting: Family Medicine

## 2017-07-25 NOTE — Progress Notes (Signed)
  Subjective:  Patient ID: Isaiah Wilson, male    DOB: 08/13/1934,  MRN: [id number]  No chief complaint on file.  83 y.o. male returns for nail care. States that he is on a blood thinner. Denies other issues.  Objective:  There were no vitals filed for this visit. General AA&O x3. Normal mood and affect.  Vascular Pedal pulses palpable.  Neurologic Epicritic sensation grossly intact.  Dermatologic No open lesions. Skin normal texture and turgor. Toenails x 10 elongated, thickened, dystrophic.  Orthopedic: Pain to palpation about the toenails.   Assessment & Plan:  Patient was evaluated and treated and all questions answered.  Onychomycosis, Coagulation defect. -Nails palliatively debrided as below. -Educated on self-care  Procedure: Nail Debridement Rationale: pain  Type of Debridement: manual, sharp debridement. Instrumentation: Nail nipper, rotary burr. Number of Nails: 10     Return in about 61 days (around 08/30/2017).

## 2017-08-18 ENCOUNTER — Other Ambulatory Visit: Payer: Self-pay | Admitting: Family Medicine

## 2017-08-23 ENCOUNTER — Encounter: Payer: Self-pay | Admitting: Podiatry

## 2017-08-23 ENCOUNTER — Ambulatory Visit: Payer: Medicare Other | Admitting: Podiatry

## 2017-08-23 DIAGNOSIS — L84 Corns and callosities: Secondary | ICD-10-CM | POA: Diagnosis not present

## 2017-08-23 DIAGNOSIS — M79676 Pain in unspecified toe(s): Secondary | ICD-10-CM | POA: Diagnosis not present

## 2017-08-23 DIAGNOSIS — D689 Coagulation defect, unspecified: Secondary | ICD-10-CM | POA: Diagnosis not present

## 2017-08-23 DIAGNOSIS — B351 Tinea unguium: Secondary | ICD-10-CM

## 2017-08-23 NOTE — Progress Notes (Signed)
Subjective:   Patient ID: Isaiah Wilson, male   DOB: 83 y.o.   MRN: [id number]   HPI Patient presents with significant incurvation of the nailbeds of the become painful and keratotic lesion that he cannot cut.  Patient also has history of anticoagulation therapy   ROS      Objective:  Physical Exam  Neurovascular status unchanged with thick yellow nails that he cannot cut and lesion formations that are painful with at risk patient     Assessment:  Mycotic infection nails with lesion formation and and at risk patient     Plan:  Debridement accomplished today debridement of lesions with sterile sharp instrumentation with no iatrogenic bleeding and patient will be seen back for routine care

## 2017-09-05 ENCOUNTER — Ambulatory Visit: Payer: Medicare Other | Admitting: Podiatry

## 2017-10-25 ENCOUNTER — Ambulatory Visit: Payer: Medicare Other | Admitting: Podiatry

## 2017-10-25 ENCOUNTER — Encounter: Payer: Self-pay | Admitting: Podiatry

## 2017-10-25 DIAGNOSIS — B351 Tinea unguium: Secondary | ICD-10-CM | POA: Diagnosis not present

## 2017-10-25 DIAGNOSIS — D689 Coagulation defect, unspecified: Secondary | ICD-10-CM | POA: Diagnosis not present

## 2017-10-25 NOTE — Progress Notes (Signed)
Subjective:   Patient ID: Isaiah Wilson, male   DOB: 83 y.o.   MRN: [id number]   HPI Patient presents with thick nailbeds 1-5 both feet that are brittle and painful and he cannot cut   ROS      Objective:  Physical Exam  Neurovascular status intact with thick yellow brittle nailbeds 1-5 both feet     Assessment:  Mycotic nail infection with pain 1-5 both feet     Plan:  Debride painful nailbeds 1-5 both feet with no iatrogenic bleeding noted

## 2017-11-21 NOTE — Progress Notes (Unsigned)
Subjective:   Isaiah Wilson is a 83 y.o. Male who presents for Medicare Annual (Subsequent) preventive examination.  Reports health as Dr Caryl Never has apt at 10am and AWV as 11 am  Diet Chol/hdl 5; chol 169; hdl34; trig 211 A1c 6.0   Exercise  There are no preventive care reminders to display for this patient.  PSA 2014       Objective:     Vitals: There were no vitals taken for this visit.  There is no height or weight on file to calculate BMI.  Advanced Directives 12/07/2016 12/07/2016 12/01/2016 11/11/2015 11/03/2015 02/17/2015  Does Patient Have a Medical Advance Directive? Yes Yes Yes No No Yes  Type of Estate agent of [place];Living will Healthcare Power of [place];Living will Healthcare Power of [place];Living will - - Healthcare Power of Attorney  Does patient want to make changes to medical advance directive? No - Patient declined No - Patient declined No - Patient declined - - -  Copy of Healthcare Power of Attorney in Chart? No - copy requested No - copy requested No - copy requested - - -  Would patient like information on creating a medical advance directive? - - - No - patient declined information No - patient declined information -    Tobacco Social History   Tobacco Use  Smoking Status Never Smoker  Smokeless Tobacco Never Used     Counseling given: Not Answered   Clinical Intake:                       Past Medical History:  Diagnosis Date  . Abnormal EKG    hx left anterior fasicular block on old ekg 2017  . Allergic rhinitis   . Allergy   . Barrett's esophagus   . Benign prostatic hypertrophy   . Colitis, ischemic (HCC)    X 2  . Complication of anesthesia    SLOW TO AWAKEN AFTER 1 SURGERY 20  YRS AGO  . DJD (degenerative joint disease)    SPINE, AND OA  . GERD (gastroesophageal reflux disease)   . HOH (hard of hearing)    BOTH EARS  . Hyperlipidemia   . Hypertension    pt denies says cardura for  bladder  . Neuropathy    LEFT HAND NUMB AND BURNS ALL THE TIME  . Overweight(278.02)    Past Surgical History:  Procedure Laterality Date  . 2 neck surgeries  2005-2006   Dr. Georgia Dom cx decompression, diskectomy,fusion C6-7 allograftWITH PLATE  . APPENDECTOMY    . FOOT SURGERY     right x 3  . LAPAROSCOPIC CHOLECYSTECTOMY  1993   Dr. Luan Moore  . left total knee replacement  07/2007   Dr. Eulah Pont  . right total knee replacement  2012   Dr. Despina Hick  . SEPTOPLASTY  1975, May 2013   x3  . TONSILLECTOMY  as child  . TOTAL KNEE REVISION Left 11/11/2015   Procedure: LEFT KNEE POLYETHELENE REVISION;  Surgeon: Ollen Gross, MD;  Location: [facility];  Service: Orthopedics;  Laterality: Left;  . TOTAL KNEE REVISION Left 12/07/2016   Procedure: LEFT TOTAL KNEE REVISION;  Surgeon: Ollen Gross, MD;  Location: [facility];  Service: Orthopedics;  Laterality: Left;  Adductor Block   Family History  Problem Relation Age of Onset  . Colon cancer Neg Hx   . Stomach cancer Neg Hx   . Rectal cancer Neg Hx   . Esophageal cancer Neg Hx  Social History   Socioeconomic History  . Marital status: Married    Spouse name: Not on file  . Number of children: 1  . Years of education: Not on file  . Highest education level: Not on file  Occupational History  . Not on file  Social Needs  . Financial resource strain: Not on file  . Food insecurity:    Worry: Not on file    Inability: Not on file  . Transportation needs:    Medical: Not on file    Non-medical: Not on file  Tobacco Use  . Smoking status: Never Smoker  . Smokeless tobacco: Never Used  Substance and Sexual Activity  . Alcohol use: No    Alcohol/week: 0.0 oz  . Drug use: No  . Sexual activity: Not on file  Lifestyle  . Physical activity:    Days per week: Not on file    Minutes per session: Not on file  . Stress: Not on file  Relationships  . Social connections:    Talks on phone: Not on file    Gets together: Not on file     Attends religious service: Not on file    Active member of club or organization: Not on file    Attends meetings of clubs or organizations: Not on file    Relationship status: Not on file  Other Topics Concern  . Not on file  Social History Narrative  . Not on file    Outpatient Encounter Medications as of 11/22/2017  Medication Sig  . acetaminophen (TYLENOL) 650 MG CR tablet Take 1,300 mg by mouth every 8 (eight) hours as needed for pain.  Marland Kitchen aspirin EC 81 MG tablet Take by mouth.  Marland Kitchen azelastine (ASTELIN) 0.1 % nasal spray Place 2 sprays into both nostrils every evening. Use in each nostril as directed   . doxazosin (CARDURA) 8 MG tablet TAKE ONE TABLET BY MOUTH EVERY NIGHT AT BEDTIME  . doxazosin (CARDURA) 8 MG tablet TAKE ONE TABLET BY MOUTH EVERY NIGHT AT BEDTIME  . EPINEPHrine 0.3 mg/0.3 mL IJ SOAJ injection Inject 0.3 mg into the muscle as needed (allergic reaction).   . fluticasone (FLONASE) 50 MCG/ACT nasal spray Place 2 sprays into both nostrils daily.   Marland Kitchen gabapentin (NEURONTIN) 300 MG capsule Take 300 mg by mouth 2 (two) times daily.  Marland Kitchen loratadine (CLARITIN) 10 MG tablet Take 10 mg by mouth.  . montelukast (SINGULAIR) 10 MG tablet TAKE ONE TABLET BY MOUTH EVERY NIGHT AT BEDTIME  . Multiple Vitamins-Minerals (CENTRUM ADULTS PO) Take by mouth.  Marland Kitchen NEXIUM 40 MG capsule TAKE ONE CAPSULE (40 MG TOTAL) BY MOUTH BEFORE BREAKFAST  . zinc gluconate 50 MG tablet Take 50 mg by mouth daily.   No facility-administered encounter medications on file as of 11/22/2017.     Activities of Daily Living In your present state of health, do you have any difficulty performing the following activities: 12/07/2016 12/01/2016  Hearing? Y Y  Comment - hoh both ears  Vision? N N  Difficulty concentrating or making decisions? N N  Walking or climbing stairs? Y Y  Dressing or bathing? N N  Doing errands, shopping? N N  Some recent data might be hidden    Patient Care Team: Kristian Covey, MD as PCP  - General (Family Medicine)    Assessment:   This is a routine wellness examination for Stancil.  Exercise Activities and Dietary recommendations    Goals    None  Fall Risk Fall Risk  11/16/2016 09/02/2015 08/18/2014 02/08/2013  Falls in the past year? No No No No   Is the patient's home free of loose throw rugs in walkways, pet beds, electrical cords, etc?   {Blank single:19197::"yes","no"}      Grab bars in the bathroom? {Blank single:19197::"yes","no"}      Handrails on the stairs?   {Blank single:19197::"yes","no"}      Adequate lighting?   {Blank single:19197::"yes","no"}  Timed Get Up and Go performed: ***  Depression Screen PHQ 2/9 Scores 11/16/2016 09/02/2015 08/18/2014 02/08/2013  PHQ - 2 Score 0 0 0 0     Cognitive Function        Immunization History  Administered Date(s) Administered  . H1N1 07/03/2008  . Influenza Split 03/04/2011, 02/07/2012  . Influenza Whole 03/27/2007, 02/27/2008, 03/13/2009  . Influenza, High Dose Seasonal PF 04/09/2015, 03/01/2016, 03/06/2017  . Influenza,inj,Quad PF,6+ Mos 02/08/2013, 02/17/2014  . Pneumococcal Conjugate-13 02/17/2014  . Pneumococcal Polysaccharide-23 11/16/2016  . Td 06/22/2009    Qualifies for Shingles Vaccine?***  Screening Tests Health Maintenance  Topic Date Due  . INFLUENZA VACCINE  12/28/2017  . TETANUS/TDAP  06/23/2019  . PNA vac Low Risk Adult  Completed    Cancer Screenings: Lung: Low Dose CT Chest recommended if Age 51-80 years, 30 pack-year currently smoking OR have quit w/in 15years. Patient {DOES NOT does:27190::"does not"} qualify. Breast:  Up to date on Mammogram? {Yes/No:30480221}   Up to date of Bone Density/Dexa? {Yes/No:30480221} Colorectal: ***  Additional Screenings: ***: Hepatitis C Screening:      Plan:   ***   I have personally reviewed and noted the following in the patient's chart:   . Medical and social history . Use of alcohol, tobacco or illicit drugs  . Current  medications and supplements . Functional ability and status . Nutritional status . Physical activity . Advanced directives . List of other physicians . Hospitalizations, surgeries, and ER visits in previous 12 months . Vitals . Screenings to include cognitive, depression, and falls . Referrals and appointments  In addition, I have reviewed and discussed with patient certain preventive protocols, quality metrics, and best practice recommendations. A written personalized care plan for preventive services as well as general preventive health recommendations were provided to patient.     Montine CircleHauck,Kemo Spruce, RN  11/21/2017

## 2017-11-22 ENCOUNTER — Encounter: Payer: Self-pay | Admitting: Family Medicine

## 2017-11-22 ENCOUNTER — Ambulatory Visit (INDEPENDENT_AMBULATORY_CARE_PROVIDER_SITE_OTHER): Payer: Medicare Other | Admitting: Family Medicine

## 2017-11-22 ENCOUNTER — Ambulatory Visit: Payer: Medicare Other

## 2017-11-22 VITALS — BP 170/80 | HR 57 | Temp 98.0°F | Ht 69.5 in | Wt 256.4 lb

## 2017-11-22 DIAGNOSIS — Z0000 Encounter for general adult medical examination without abnormal findings: Secondary | ICD-10-CM | POA: Diagnosis not present

## 2017-11-22 LAB — BASIC METABOLIC PANEL
BUN: 14 mg/dL (ref 6–23)
CO2: 29 mEq/L (ref 19–32)
CREATININE: 0.96 mg/dL (ref 0.40–1.50)
Calcium: 9.2 mg/dL (ref 8.4–10.5)
Chloride: 108 mEq/L (ref 96–112)
GFR: 79.98 mL/min (ref >60.00)
Glucose, Bld: 106 mg/dL — ABNORMAL HIGH (ref 70–99)
Potassium: 4.4 mEq/L (ref 3.5–5.1)
SODIUM: 142 meq/L (ref 135–145)

## 2017-11-22 LAB — CBC WITH DIFFERENTIAL/PLATELET
Basophils Absolute: 0.1 10*3/uL (ref 0.0–0.1)
Basophils Relative: 1 % (ref 0.0–3.0)
EOS PCT: 2.2 % (ref 0.0–5.0)
Eosinophils Absolute: 0.2 10*3/uL (ref 0.0–0.7)
HEMATOCRIT: 39.9 % (ref 39.0–52.0)
Hemoglobin: 13.5 g/dL (ref 13.0–17.0)
LYMPHS PCT: 35.7 % (ref 12.0–46.0)
Lymphs Abs: 2.8 10*3/uL (ref 0.7–4.0)
MCHC: 33.8 g/dL (ref 30.0–36.0)
MCV: 86.8 fl (ref 78.0–100.0)
MONOS PCT: 9.9 % (ref 3.0–12.0)
Monocytes Absolute: 0.8 10*3/uL (ref 0.1–1.0)
NEUTROS ABS: 4 10*3/uL (ref 1.4–7.7)
Neutrophils Relative %: 51.2 % (ref 43.0–77.0)
Platelets: 202 10*3/uL (ref 150.0–400.0)
RBC: 4.59 Mil/uL (ref 4.22–5.81)
RDW: 15 % (ref 11.5–15.5)
WBC: 7.7 10*3/uL (ref 4.0–10.5)

## 2017-11-22 LAB — HEPATIC FUNCTION PANEL
ALBUMIN: 4.3 g/dL (ref 3.5–5.2)
ALK PHOS: 60 U/L (ref 39–117)
ALT: 25 U/L (ref 0–53)
AST: 25 U/L (ref 0–37)
BILIRUBIN DIRECT: 0.1 mg/dL (ref 0.0–0.3)
TOTAL PROTEIN: 7.2 g/dL (ref 6.0–8.3)
Total Bilirubin: 0.5 mg/dL (ref 0.2–1.2)

## 2017-11-22 LAB — LIPID PANEL
Cholesterol: 169 mg/dL (ref 0–200)
HDL: 40.1 mg/dL (ref >39.00)
LDL Cholesterol: 96 mg/dL (ref 0–99)
NONHDL: 128.8
TRIGLYCERIDES: 164 mg/dL — AB (ref 0.0–149.0)
Total CHOL/HDL Ratio: 4
VLDL: 32.8 mg/dL (ref 0.0–40.0)

## 2017-11-22 LAB — TSH: TSH: 2.4 u[IU]/mL (ref 0.35–4.50)

## 2017-11-22 NOTE — Patient Instructions (Addendum)
Monitor blood pressure and record home readings and bring with you at follow up.  Try to keep sodium intake < 2,000 mg per day.  Lose some weight!  Low-Sodium Eating Plan Sodium, which is an element that makes up salt, helps you maintain a healthy balance of fluids in your body. Too much sodium can increase your blood pressure and cause fluid and waste to be held in your body. Your health care provider or dietitian may recommend following this plan if you have high blood pressure (hypertension), kidney disease, liver disease, or heart failure. Eating less sodium can help lower your blood pressure, reduce swelling, and protect your heart, liver, and kidneys. What are tips for following this plan? General guidelines  Most people on this plan should limit their sodium intake to 1,500-2,000 mg (milligrams) of sodium each day. Reading food labels  The Nutrition Facts label lists the amount of sodium in one serving of the food. If you eat more than one serving, you must multiply the listed amount of sodium by the number of servings.  Choose foods with less than 140 mg of sodium per serving.  Avoid foods with 300 mg of sodium or more per serving. Shopping  Look for lower-sodium products, often labeled as "low-sodium" or "no salt added."  Always check the sodium content even if foods are labeled as "unsalted" or "no salt added".  Buy fresh foods. ? Avoid canned foods and premade or frozen meals. ? Avoid canned, cured, or processed meats  Buy breads that have less than 80 mg of sodium per slice. Cooking  Eat more home-cooked food and less restaurant, buffet, and fast food.  Avoid adding salt when cooking. Use salt-free seasonings or herbs instead of table salt or sea salt. Check with your health care provider or pharmacist before using salt substitutes.  Cook with plant-based oils, such as canola, sunflower, or olive oil. Meal planning  When eating at a restaurant, ask that your food  be prepared with less salt or no salt, if possible.  Avoid foods that contain MSG (monosodium glutamate). MSG is sometimes added to [place] food, bouillon, and some canned foods. What foods are recommended? The items listed may not be a complete list. Talk with your dietitian about what dietary choices are best for you. Grains Low-sodium cereals, including oats, puffed wheat and rice, and shredded wheat. Low-sodium crackers. Unsalted rice. Unsalted pasta. Low-sodium bread. Whole-grain breads and whole-grain pasta. Vegetables Fresh or frozen vegetables. "No salt added" canned vegetables. "No salt added" tomato sauce and paste. Low-sodium or reduced-sodium tomato and vegetable juice. Fruits Fresh, frozen, or canned fruit. Fruit juice. Meats and other protein foods Fresh or frozen (no salt added) meat, poultry, seafood, and fish. Low-sodium canned tuna and salmon. Unsalted nuts. Dried peas, beans, and lentils without added salt. Unsalted canned beans. Eggs. Unsalted nut butters. Dairy Milk. Soy milk. Cheese that is naturally low in sodium, such as ricotta cheese, fresh mozzarella, or Swiss cheese Low-sodium or reduced-sodium cheese. Cream cheese. Yogurt. Fats and oils Unsalted butter. Unsalted margarine with no trans fat. Vegetable oils such as canola or olive oils. Seasonings and other foods Fresh and dried herbs and spices. Salt-free seasonings. Low-sodium mustard and ketchup. Sodium-free salad dressing. Sodium-free light mayonnaise. Fresh or refrigerated horseradish. Lemon juice. Vinegar. Homemade, reduced-sodium, or low-sodium soups. Unsalted popcorn and pretzels. Low-salt or salt-free chips. What foods are not recommended? The items listed may not be a complete list. Talk with your dietitian about what dietary choices are best  for you. Grains Instant hot cereals. Bread stuffing, pancake, and biscuit mixes. Croutons. Seasoned rice or pasta mixes. Noodle soup cups. Boxed or frozen macaroni and  cheese. Regular salted crackers. Self-rising flour. Vegetables Sauerkraut, pickled vegetables, and relishes. Olives. [place] fries. Onion rings. Regular canned vegetables (not low-sodium or reduced-sodium). Regular canned tomato sauce and paste (not low-sodium or reduced-sodium). Regular tomato and vegetable juice (not low-sodium or reduced-sodium). Frozen vegetables in sauces. Meats and other protein foods Meat or fish that is salted, canned, smoked, spiced, or pickled. Bacon, ham, sausage, hotdogs, corned beef, chipped beef, packaged lunch meats, salt pork, jerky, pickled herring, anchovies, regular canned tuna, sardines, salted nuts. Dairy Processed cheese and cheese spreads. Cheese curds. Blue cheese. Feta cheese. String cheese. Regular cottage cheese. Buttermilk. Canned milk. Fats and oils Salted butter. Regular margarine. Ghee. Bacon fat. Seasonings and other foods Onion salt, garlic salt, seasoned salt, table salt, and sea salt. Canned and packaged gravies. Worcestershire sauce. Tartar sauce. Barbecue sauce. Teriyaki sauce. Soy sauce, including reduced-sodium. Steak sauce. Fish sauce. Oyster sauce. Cocktail sauce. Horseradish that you find on the shelf. Regular ketchup and mustard. Meat flavorings and tenderizers. Bouillon cubes. Hot sauce and Tabasco sauce. Premade or packaged marinades. Premade or packaged taco seasonings. Relishes. Regular salad dressings. Salsa. Potato and tortilla chips. Corn chips and puffs. Salted popcorn and pretzels. Canned or dried soups. Pizza. Frozen entrees and pot pies. Summary  Eating less sodium can help lower your blood pressure, reduce swelling, and protect your heart, liver, and kidneys.  Most people on this plan should limit their sodium intake to 1,500-2,000 mg (milligrams) of sodium each day.  Canned, boxed, and frozen foods are high in sodium. Restaurant foods, fast foods, and pizza are also very high in sodium. You also get sodium by adding salt to  food.  Try to cook at home, eat more fresh fruits and vegetables, and eat less fast food, canned, processed, or prepared foods. This information is not intended to replace advice given to you by your health care provider. Make sure you discuss any questions you have with your health care provider. Document Released: 11/05/2001 Document Revised: 05/09/2016 Document Reviewed: 05/09/2016 Elsevier Interactive Patient Education  [employer].

## 2017-11-22 NOTE — Progress Notes (Signed)
Subjective:     Patient ID: Isaiah Wilson, male   DOB: 09/13/1934, 83 y.o.   MRN: [id number]  HPI Patient seen for physical exam. He's had some major issues with knee pain and chronic low back pain. 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 This has limited his activities which is contributing (he thinks) to some mild depression. He also has history of BPH, obesity, hyperlipidemia, GERD  Apparently very high sodium diet. For example, recently had eaten potato chips and bologna and cheese sandwich  Never been treated for hypertension. Generally had good readings here but elevated reading today. Denies recent headaches. No recent chest pain.  Past Medical History:  Diagnosis Date  . Abnormal EKG    hx left anterior fasicular block on old ekg 2017  . Allergic rhinitis   . Allergy   . Barrett's esophagus   . Benign prostatic hypertrophy   . Colitis, ischemic (HCC)    X 2  . Complication of anesthesia    SLOW TO AWAKEN AFTER 1 SURGERY 20  YRS AGO  . DJD (degenerative joint disease)    SPINE, AND OA  . GERD (gastroesophageal reflux disease)   . HOH (hard of hearing)    BOTH EARS  . Hyperlipidemia   . Hypertension    pt denies says cardura for bladder  . Neuropathy    LEFT HAND NUMB AND BURNS ALL THE TIME  . Overweight(278.02)    Past Surgical History:  Procedure Laterality Date  . 2 neck surgeries  2005-2006   Dr. Georgia Dom cx decompression, diskectomy,fusion C6-7 allograftWITH PLATE  . APPENDECTOMY    . FOOT SURGERY     right x 3  . LAPAROSCOPIC CHOLECYSTECTOMY  1993   Dr. Luan Moore  . left total knee replacement  07/2007   Dr. Eulah Pont  . right total knee replacement  2012   Dr. Despina Hick  . SEPTOPLASTY  1975, May 2013   x3  . TONSILLECTOMY  as child  . TOTAL KNEE REVISION Left 11/11/2015   Procedure: LEFT KNEE POLYETHELENE REVISION;  Surgeon: Ollen Gross, MD;  Location: [facility];  Service: Orthopedics;  Laterality: Left;  . TOTAL KNEE  REVISION Left 12/07/2016   Procedure: LEFT TOTAL KNEE REVISION;  Surgeon: Ollen Gross, MD;  Location: [facility];  Service: Orthopedics;  Laterality: Left;  Adductor Block    reports that he has never smoked. He has never used smokeless tobacco. He reports that he does not drink alcohol or use drugs. family history is not on file. No Known Allergies   Review of Systems  Constitutional: Negative for appetite change, fatigue and unexpected weight change.  Eyes: Negative for visual disturbance.  Respiratory: Negative for cough, chest tightness and shortness of breath.   Cardiovascular: Negative for chest pain, palpitations and leg swelling.  Gastrointestinal: Negative for abdominal pain.  Genitourinary: Positive for urgency.  Musculoskeletal: Positive for arthralgias and back pain.  Neurological: Negative for dizziness, syncope, weakness, light-headedness and headaches.       Objective:   Physical Exam  Constitutional: He is oriented to person, place, and time. He appears well-developed and well-nourished.  HENT:  Right Ear: External ear normal.  Left Ear: External ear normal.  Mouth/Throat: Oropharynx is clear and moist.  Eyes: Pupils are equal, round, and reactive to light.  Neck: Neck supple. No thyromegaly present.  Cardiovascular: Normal rate and regular rhythm.  Pulmonary/Chest: Effort normal and breath sounds normal. No respiratory distress. He has no wheezes. He  has no rales.  Musculoskeletal: He exhibits no edema.  Neurological: He is alert and oriented to person, place, and time.       Assessment:     Physical exam. He has significant elevated blood pressure today. Initial reading by nurse 152/80 repeat by me large cuff right and left arm 170/80    Plan:     - -Strongly encouraged to lose some weight  -Keep sodium intake less than 2000 mg daily  -Obtain screening labs  -Record home blood pressure readings and come back in follow-up in 2 weeks to reassess   Kristian CoveyBruce W  Burchette MD [facility]

## 2017-11-23 ENCOUNTER — Other Ambulatory Visit: Payer: Self-pay | Admitting: Family Medicine

## 2017-11-23 ENCOUNTER — Ambulatory Visit: Payer: Self-pay

## 2017-11-23 NOTE — Telephone Encounter (Signed)
Patient called in and says "my BP is 180/90, I saw Dr. Caryl NeverBurchette yesterday and he told me to come back if my BP is up. I know he's not there today, so I was wondering if I can come in tomorrow." I advised there is no availability tomorrow. He says "I will just come to the office and wait to see him." I called the [facility] and [facility] who says to let the patient know his nurse will call him tomorrow with what Dr. Caryl NeverBurchette wants him to do. I advised the patient and he says he will wait to hear from the office. I advised to continue checking his BP, he says the machine doesn't always work, but he will try.   Reason for Disposition . [1] Caller requesting NON-URGENT health information AND [2] PCP's office is the best resource  Answer Assessment - Initial Assessment Questions 1. REASON FOR CALL or QUESTION: "What is your reason for calling today?" or "How can I best help you?" or "What question do you have that I can help answer?"     My BP is 180/90  Protocols used: INFORMATION ONLY CALL-A-AH

## 2017-11-24 ENCOUNTER — Ambulatory Visit: Payer: Medicare Other | Admitting: Family Medicine

## 2017-11-24 ENCOUNTER — Encounter: Payer: Self-pay | Admitting: Family Medicine

## 2017-11-24 VITALS — BP 118/60 | HR 58 | Temp 98.6°F | Wt 254.0 lb

## 2017-11-24 DIAGNOSIS — R030 Elevated blood-pressure reading, without diagnosis of hypertension: Secondary | ICD-10-CM

## 2017-11-24 NOTE — Telephone Encounter (Signed)
Appointment made

## 2017-11-24 NOTE — Progress Notes (Signed)
Subjective:     Patient ID: Isaiah Wilson, male   DOB: 06/26/1934, 83 y.o.   MRN: [id number]  HPI Patient seen with concerns for elevated blood pressure yesterday. He was just seen for physical and had elevated reading here and we elected to wait and observe and record several home readings over 2 weeks before starting additional medication. Patient already on Cardura 8 mg daily (for BPH).  He had elevated blood pressure readings yesterday of 175/90 and 180/90. He did not have any headaches or other symptoms at that time. Blood pressures back down today. He has been trying to reduce his salt intake. No recent chest pains. No dizziness. No headaches  Past Medical History:  Diagnosis Date  . Abnormal EKG    hx left anterior fasicular block on old ekg 2017  . Allergic rhinitis   . Allergy   . Barrett's esophagus   . Benign prostatic hypertrophy   . Colitis, ischemic (HCC)    X 2  . Complication of anesthesia    SLOW TO AWAKEN AFTER 1 SURGERY 20  YRS AGO  . DJD (degenerative joint disease)    SPINE, AND OA  . GERD (gastroesophageal reflux disease)   . HOH (hard of hearing)    BOTH EARS  . Hyperlipidemia   . Hypertension    pt denies says cardura for bladder  . Neuropathy    LEFT HAND NUMB AND BURNS ALL THE TIME  . Overweight(278.02)    Past Surgical History:  Procedure Laterality Date  . 2 neck surgeries  2005-2006   Dr. Georgia DomHirsh--ant cx decompression, diskectomy,fusion C6-7 allograftWITH PLATE  . APPENDECTOMY    . FOOT SURGERY     right x 3  . LAPAROSCOPIC CHOLECYSTECTOMY  1993   Dr. Luan MooreP. Young  . left total knee replacement  07/2007   Dr. Eulah Pontmurphy  . right total knee replacement  2012   Dr. Despina HickAlusio  . SEPTOPLASTY  1975, May 2013   x3  . TONSILLECTOMY  as child  . TOTAL KNEE REVISION Left 11/11/2015   Procedure: LEFT KNEE POLYETHELENE REVISION;  Surgeon: Ollen GrossFrank Aluisio, MD;  Location: [facility];  Service: Orthopedics;  Laterality: Left;  . TOTAL KNEE REVISION Left 12/07/2016   Procedure: LEFT TOTAL KNEE REVISION;  Surgeon: Ollen GrossAluisio, Frank, MD;  Location: [facility];  Service: Orthopedics;  Laterality: Left;  Adductor Block    reports that he has never smoked. He has never used smokeless tobacco. He reports that he does not drink alcohol or use drugs. family history is not on file. No Known Allergies   Review of Systems  Constitutional: Negative for fatigue.  Eyes: Negative for visual disturbance.  Respiratory: Negative for cough, chest tightness and shortness of breath.   Cardiovascular: Negative for chest pain, palpitations and leg swelling.  Neurological: Negative for dizziness, syncope, weakness, light-headedness and headaches.       Objective:   Physical Exam  Constitutional: He is oriented to person, place, and time. He appears well-developed and well-nourished.  HENT:  Right Ear: External ear normal.  Left Ear: External ear normal.  Mouth/Throat: Oropharynx is clear and moist.  Eyes: Pupils are equal, round, and reactive to light.  Neck: Neck supple. No thyromegaly present.  Cardiovascular: Normal rate and regular rhythm.  Pulmonary/Chest: Effort normal and breath sounds normal. No respiratory distress. He has no wheezes. He has no rales.  Musculoskeletal: He exhibits no edema.  Neurological: He is alert and oriented to person, place, and time.  Assessment:     Elevated blood pressures by recent readings at home. Much improved today with repeat left and right arm seated 118/60    Plan:     -Continue to monitor blood pressure and be in touch if consistently greater than 140/90. -Continue to minimize sodium intake -Keep follow-up in 2 weeks to reassess  Kristian Covey MD [facility]

## 2017-11-27 ENCOUNTER — Ambulatory Visit: Payer: Medicare Other

## 2017-12-06 ENCOUNTER — Ambulatory Visit: Payer: Medicare Other | Admitting: Family Medicine

## 2017-12-06 ENCOUNTER — Encounter: Payer: Self-pay | Admitting: Family Medicine

## 2017-12-06 VITALS — BP 128/68 | HR 60 | Temp 98.7°F

## 2017-12-06 DIAGNOSIS — R030 Elevated blood-pressure reading, without diagnosis of hypertension: Secondary | ICD-10-CM

## 2017-12-06 DIAGNOSIS — M25569 Pain in unspecified knee: Secondary | ICD-10-CM

## 2017-12-06 DIAGNOSIS — G8929 Other chronic pain: Secondary | ICD-10-CM | POA: Diagnosis not present

## 2017-12-06 DIAGNOSIS — R3915 Urgency of urination: Secondary | ICD-10-CM | POA: Diagnosis not present

## 2017-12-06 NOTE — Patient Instructions (Signed)
Monitor blood pressure and be in touch if consistently > 140/90.   

## 2017-12-06 NOTE — Progress Notes (Signed)
Subjective:     Patient ID: Isaiah Wilson, male   DOB: 09/07/1934, 83 y.o.   MRN: [id number]  HPI Patient seen for the following issues  Elevated blood pressure. Refer to recent visits. He's had several readings have been high at home but has consistently been better here. Patient relates home blood pressure yesterday morning 178/85. He is having increasing knee pain and has had multiple knee replacements. He has follow-up with orthopedics next week. He thinks his knee pain is probably elevating his blood pressure somewhat. No headaches. No dizziness. No chest pain. He has made a conscious effort to reduce sodium intake. He is very limited with exercise because of his knee difficulties.  Patient relates some occasional slow urinary stream but mostly symptoms of urine urgency. No burning with urination. He is on Cardura 8 mg at night. Occasional nocturia but this is not a major problem- mostly urgency. He's not sure this is enough of a problem to go on additional medications at this point. No diuretic use.  Past Medical History:  Diagnosis Date  . Abnormal EKG    hx left anterior fasicular block on old ekg 2017  . Allergic rhinitis   . Allergy   . Barrett's esophagus   . Benign prostatic hypertrophy   . Colitis, ischemic (HCC)    X 2  . Complication of anesthesia    SLOW TO AWAKEN AFTER 1 SURGERY 20  YRS AGO  . DJD (degenerative joint disease)    SPINE, AND OA  . GERD (gastroesophageal reflux disease)   . HOH (hard of hearing)    BOTH EARS  . Hyperlipidemia   . Hypertension    pt denies says cardura for bladder  . Neuropathy    LEFT HAND NUMB AND BURNS ALL THE TIME  . Overweight(278.02)    Past Surgical History:  Procedure Laterality Date  . 2 neck surgeries  2005-2006   Dr. Georgia DomHirsh--ant cx decompression, diskectomy,fusion C6-7 allograftWITH PLATE  . APPENDECTOMY    . FOOT SURGERY     right x 3  . LAPAROSCOPIC CHOLECYSTECTOMY  1993   Dr. Luan MooreP. Young  . left total knee  replacement  07/2007   Dr. Eulah Pontmurphy  . right total knee replacement  2012   Dr. Despina HickAlusio  . SEPTOPLASTY  1975, May 2013   x3  . TONSILLECTOMY  as child  . TOTAL KNEE REVISION Left 11/11/2015   Procedure: LEFT KNEE POLYETHELENE REVISION;  Surgeon: Ollen GrossFrank Aluisio, MD;  Location: [facility];  Service: Orthopedics;  Laterality: Left;  . TOTAL KNEE REVISION Left 12/07/2016   Procedure: LEFT TOTAL KNEE REVISION;  Surgeon: Ollen GrossAluisio, Frank, MD;  Location: [facility];  Service: Orthopedics;  Laterality: Left;  Adductor Block    reports that he has never smoked. He has never used smokeless tobacco. He reports that he does not drink alcohol or use drugs. family history is not on file. No Known Allergies   Review of Systems  Constitutional: Negative for chills, fatigue and fever.  Eyes: Negative for visual disturbance.  Respiratory: Negative for cough, chest tightness and shortness of breath.   Cardiovascular: Negative for chest pain, palpitations and leg swelling.  Endocrine: Negative for polydipsia and polyuria.  Genitourinary: Positive for urgency. Negative for hematuria.  Neurological: Negative for dizziness, syncope, weakness, light-headedness and headaches.       Objective:   Physical Exam  Constitutional: He appears well-developed and well-nourished.  Cardiovascular: Normal rate and regular rhythm.  Pulmonary/Chest: Effort normal and breath sounds normal.  Musculoskeletal: He exhibits no edema.       Assessment:     #1 recent elevated blood pressures by home readings. He has consistently past couple of visits been very well controlled here. Repeat left arm seated at rest 128/68  #2 urinary urgency. He does have history of some probable BPH but for the most part no significant obstructive symptoms.    Plan:     -continue to monitor blood pressure closely at home. His knee pain may be exacerbating his blood pressure at times. We've recommended he get a regular sphygmomanometer if he can get  someone to check him with that. Question accuracy his home cuff -Continue to watch sodium intake closely -Be in touch if consistent blood pressure readings over 140/90 -we discussed that he may be a candidate for 24 hours ambulatory blood pressure monitoring.  At this point he wishes to wait. -We discussed possible medication options for urinary urgency at this point he wishes to wait.  Kristian Covey MD [facility]

## 2017-12-15 DIAGNOSIS — Z5189 Encounter for other specified aftercare: Secondary | ICD-10-CM | POA: Insufficient documentation

## 2017-12-27 ENCOUNTER — Ambulatory Visit: Payer: Self-pay

## 2017-12-27 DIAGNOSIS — M216X9 Other acquired deformities of unspecified foot: Secondary | ICD-10-CM

## 2017-12-27 DIAGNOSIS — B351 Tinea unguium: Secondary | ICD-10-CM

## 2017-12-27 DIAGNOSIS — D689 Coagulation defect, unspecified: Secondary | ICD-10-CM

## 2017-12-27 NOTE — Progress Notes (Signed)
Patient presents with painful nails that he states are difficult for him to cut.  Painful thick yellow brittle nails 1 through 5 bilateral.  Pedal pulses present bilateral.  No callus, no lesions, no wounds.  Skin warm, pink, and dry  Debridement of nails 1 through 5 bilateral with no iatrogenic bleeding.  Patient is to follow-up in 2 months for routine foot care.

## 2018-01-22 ENCOUNTER — Other Ambulatory Visit: Payer: Self-pay | Admitting: Family Medicine

## 2018-02-06 ENCOUNTER — Telehealth: Payer: Self-pay

## 2018-02-06 NOTE — Telephone Encounter (Signed)
In order to complete the patients surgical clearance form he will need to come in for a follow up per Dr. Caryl Never. Dr. Caryl Never has stated that there are some items such as A1C that are required on the form and these have not been done in the office recently. Permission to schedule a appointment per Dr. Caryl Never.  Called patient and left a message asking him to call back.  CRM created.

## 2018-02-06 NOTE — Progress Notes (Addendum)
Subjective:   Isaiah Wilson is a 83 y.o. male who presents for Medicare Annual/Subsequent preventive examination.  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 INFLUENZA VACCINE  12/28/2017     Had one dose of shingrix - educated to take 2nd within 6 months  High dose flu vaccine given today   Cardiac Risk Factors include: advanced age (>29men, >39 women);family history of premature cardiovascular disease     Objective:    Vitals: BP 140/64   Pulse (!) 54   Ht 5' 9.5" (1.765 m)   Wt 251 lb (113.9 kg)   SpO2 96%   BMI 36.53 kg/m   Body mass index is 36.53 kg/m.  Advanced Directives 02/07/2018 12/07/2016 12/07/2016 12/01/2016 11/11/2015 11/03/2015 02/17/2015  Does Patient Have a Medical Advance Directive? Yes Yes Yes Yes No No Yes  Type of Advance Directive - Healthcare Power of [place];Living will Healthcare Power of [place];Living will Healthcare Power of [place];Living will - - Healthcare Power of Attorney  Does patient want to make changes to medical advance directive? - No - Patient declined No - Patient declined No - Patient declined - - -  Copy of Healthcare Power of Attorney in Chart? - No - copy requested No - copy requested No - copy requested - - -  Would patient like information on creating a medical advance directive? - - - - No - patient declined information No - patient declined information -    Tobacco Social History   Tobacco Use  Smoking Status Never Smoker  Smokeless Tobacco Never Used     Counseling given: Yes   Clinical  Intake:    Past Medical History:  Diagnosis Date  . Abnormal EKG    hx left anterior fasicular block on old ekg 2017  . Allergic rhinitis   . Allergy   . Barrett's esophagus   . Benign prostatic hypertrophy   . Colitis, ischemic (HCC)    X 2  . Complication of anesthesia    SLOW TO AWAKEN AFTER 1 SURGERY 20  YRS AGO  . DJD (degenerative joint disease)    SPINE, AND OA  . GERD (gastroesophageal reflux disease)   . HOH (hard of hearing)    BOTH EARS  . Hyperlipidemia   . Hypertension    pt denies says cardura for bladder  . Neuropathy    LEFT HAND NUMB AND BURNS ALL THE TIME  . Overweight(278.02)    Past Surgical History:  Procedure Laterality Date  . 2 neck surgeries  2005-2006   Dr. Georgia Dom cx decompression, diskectomy,fusion C6-7 allograftWITH PLATE  . APPENDECTOMY    . FOOT SURGERY     right x 3  . LAPAROSCOPIC CHOLECYSTECTOMY  1993   Dr. Luan Moore  . left total knee replacement  07/2007   Dr. Eulah Pont  . right total knee replacement  2012   Dr. Despina Hick  . SEPTOPLASTY  1975, May 2013   x3  . TONSILLECTOMY  as child  . TOTAL KNEE  REVISION Left 11/11/2015   Procedure: LEFT KNEE POLYETHELENE REVISION;  Surgeon: Ollen Gross, MD;  Location: [facility];  Service: Orthopedics;  Laterality: Left;  . TOTAL KNEE REVISION Left 12/07/2016   Procedure: LEFT TOTAL KNEE REVISION;  Surgeon: Ollen Gross, MD;  Location: [facility];  Service: Orthopedics;  Laterality: Left;  Adductor Block   Family History  Problem Relation Age of Onset  . Colon cancer Neg Hx   . Stomach cancer Neg Hx   . Rectal cancer Neg Hx   . Esophageal cancer Neg Hx    Social History   Socioeconomic History  . Marital status: Married    Spouse name: Not on file  . Number of children: 1  . Years of education: Not on file  . Highest education level: Not on file  Occupational History  . Not on file  Social Needs  . Financial resource strain: Not on file  . Food insecurity:    Worry: Not on file     Inability: Not on file  . Transportation needs:    Medical: Not on file    Non-medical: Not on file  Tobacco Use  . Smoking status: Never Smoker  . Smokeless tobacco: Never Used  Substance and Sexual Activity  . Alcohol use: No    Alcohol/week: 0.0 standard drinks  . Drug use: No  . Sexual activity: Not on file  Lifestyle  . Physical activity:    Days per week: Not on file    Minutes per session: Not on file  . Stress: Not on file  Relationships  . Social connections:    Talks on phone: Not on file    Gets together: Not on file    Attends religious service: Not on file    Active member of club or organization: Not on file    Attends meetings of clubs or organizations: Not on file    Relationship status: Not on file  Other Topics Concern  . Not on file  Social History Narrative  . Not on file    Outpatient Encounter Medications as of 02/07/2018  Medication Sig  . acetaminophen (TYLENOL) 650 MG CR tablet Take 1,300 mg by mouth every 8 (eight) hours as needed for pain.  Marland Kitchen aspirin EC 81 MG tablet Take by mouth.  Marland Kitchen azelastine (ASTELIN) 0.1 % nasal spray Place 2 sprays into both nostrils every evening. Use in each nostril as directed   . doxazosin (CARDURA) 8 MG tablet TAKE ONE TABLET BY MOUTH EVERY NIGHT AT BEDTIME  . EPINEPHrine 0.3 mg/0.3 mL IJ SOAJ injection Inject 0.3 mg into the muscle as needed (allergic reaction).   . fluticasone (FLONASE) 50 MCG/ACT nasal spray Place 2 sprays into both nostrils daily.   Marland Kitchen gabapentin (NEURONTIN) 300 MG capsule Take 300 mg by mouth 2 (two) times daily.  Marland Kitchen loratadine (CLARITIN) 10 MG tablet Take 10 mg by mouth.  . montelukast (SINGULAIR) 10 MG tablet TAKE ONE TABLET BY MOUTH EVERY NIGHT AT BEDTIME  . Multiple Vitamins-Minerals (CENTRUM ADULTS PO) Take by mouth.  . traMADol (ULTRAM) 50 MG tablet Take 50 mg by mouth every 6 (six) hours as needed.  Marland Kitchen NEXIUM 40 MG capsule TAKE ONE CAPSULE BY MOUTH EVERY MORNING BEFORE BREAKFAST  . zinc  gluconate 50 MG tablet Take 50 mg by mouth daily.   No facility-administered encounter medications on file as of 02/07/2018.     Activities of Daily Living In your present state of health, do you have any difficulty performing  the following activities: 02/07/2018  Hearing? N  Vision? N  Difficulty concentrating or making decisions? N  Walking or climbing stairs? Y  Dressing or bathing? N  Doing errands, shopping? N  Preparing Food and eating ? N  Using the Toilet? N  In the past six months, have you accidently leaked urine? Y  Comment medication  Do you have problems with loss of bowel control? Y  Managing your Medications? Y  Managing your Finances? N  Housekeeping or managing your Housekeeping? N  Some recent data might be hidden    Patient Care Team: Kristian Covey, MD as PCP - General (Family Medicine)   Assessment:   This is a routine wellness examination for Isaiah Wilson.  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 Patient Stated     Continue to exercise and live healthy  Less pain and more trips        Fall Risk Fall Risk  02/07/2018 11/22/2017 11/16/2016 09/02/2015 08/18/2014  Falls in the past year? No No No No No     Depression Screen PHQ 2/9 Scores 02/07/2018 11/22/2017 11/16/2016 09/02/2015  PHQ - 2 Score 0 0 0 0    Cognitive Function MMSE - Mini Mental State Exam 02/07/2018  Not completed: (No Data)     Ad8 score reviewed for issues:  Issues making decisions:  Less interest in hobbies / activities:  Repeats questions, stories (family complaining):  Trouble using ordinary gadgets (microwave, computer, phone):  Forgets the month or year:   Mismanaging finances:   Remembering appts:  Daily problems with thinking and/or memory: Ad8 score  is=0        Immunization History  Administered Date(s) Administered  . H1N1 07/03/2008  . Influenza Split 03/04/2011, 02/07/2012  . Influenza Whole 03/27/2007, 02/27/2008, 03/13/2009  . Influenza, High Dose Seasonal PF 04/09/2015, 03/01/2016, 03/06/2017, 02/07/2018  . Influenza,inj,Quad PF,6+ Mos 02/08/2013, 02/17/2014  . Pneumococcal Conjugate-13 02/17/2014  . Pneumococcal Polysaccharide-23 11/16/2016  . Td 06/22/2009  . Zoster Recombinat (Shingrix) 11/24/2017      Screening Tests Health Maintenance  Topic Date Due  . INFLUENZA VACCINE  12/28/2017  . TETANUS/TDAP  06/23/2019  . PNA vac Low Risk Adult  Completed         Plan:      PCP Notes  Health Maintenance  Had one dose of shingrix - educated to take 2nd within 6 months  High dose flu vaccine given today   Abnormal Screens  None Is having pain in left hip pending surgery Pain med is not helping  States he has some difficulty hearing  A1c elevated in 2016 but Fasting BS < 115   Referrals  none  Patient concerns; Pain and mobility  Walks with cane;   Nurse Concerns; As noted - pain score 7 from 1- 10   Next PCP apt Seeing Dr. Caryl Never today for evaluate for pending surgery in November      I have personally reviewed and noted the following in the patient's chart:   . Medical and social history . Use of alcohol, tobacco or illicit drugs  . Current medications and supplements . Functional ability and status . Nutritional status . Physical activity . Advanced directives . List of other physicians . Hospitalizations, surgeries, and ER visits in previous 12 months . Vitals . Screenings to include  cognitive, depression, and falls . Referrals and appointments  In addition, I have reviewed and discussed with patient certain preventive protocols, quality metrics, and best practice recommendations. A written personalized care plan for preventive services as well as general preventive health  recommendations were provided to patient.     Kacy Conely, RN  02/07/2018  I have reviewed the documentation for the AWV and Advanced Care Planning provided by the health coach and agree with their documentation. I was immediately available for any questions  Kristian Covey MD [facility] at [facility]

## 2018-02-07 ENCOUNTER — Ambulatory Visit (INDEPENDENT_AMBULATORY_CARE_PROVIDER_SITE_OTHER): Payer: Medicare Other

## 2018-02-07 ENCOUNTER — Encounter: Payer: Self-pay | Admitting: Family Medicine

## 2018-02-07 ENCOUNTER — Ambulatory Visit: Payer: Medicare Other | Admitting: Family Medicine

## 2018-02-07 VITALS — BP 140/70 | HR 92 | Wt 251.0 lb

## 2018-02-07 DIAGNOSIS — Z01818 Encounter for other preprocedural examination: Secondary | ICD-10-CM

## 2018-02-07 DIAGNOSIS — M1990 Unspecified osteoarthritis, unspecified site: Secondary | ICD-10-CM | POA: Diagnosis not present

## 2018-02-07 DIAGNOSIS — Z0000 Encounter for general adult medical examination without abnormal findings: Secondary | ICD-10-CM

## 2018-02-07 DIAGNOSIS — R030 Elevated blood-pressure reading, without diagnosis of hypertension: Secondary | ICD-10-CM | POA: Diagnosis not present

## 2018-02-07 DIAGNOSIS — Z23 Encounter for immunization: Secondary | ICD-10-CM

## 2018-02-07 DIAGNOSIS — R7309 Other abnormal glucose: Secondary | ICD-10-CM | POA: Diagnosis not present

## 2018-02-07 LAB — POCT GLYCOSYLATED HEMOGLOBIN (HGB A1C): HEMOGLOBIN A1C: 5.7 % — AB (ref 4.0–5.6)

## 2018-02-07 NOTE — Progress Notes (Signed)
Subjective:     Patient ID: Isaiah Wilson, male   DOB: August 22, 1934, 83 y.o.   MRN: [id number]  HPI Patient seen for preoperative clearance. He's had previous multiple knee surgeries and has osteoarthritis left hip with planned total hip replacement on the left.  He has no cardiac history. No chronic lung disease. Does take aspirin and knows to stop 5 days in advance of surgery.  No history of diabetes. Clearance forms are requesting A1c. He had recent normal albumin. Blood pressures been stable.  Chronic problems include history of GERD, Barrett's  esophagus, osteoarthritis, BPH, obesity.  Denies any recent dyspnea or chest pain  Past Medical History:  Diagnosis Date  . Abnormal EKG    hx left anterior fasicular block on old ekg 2017  . Allergic rhinitis   . Allergy   . Barrett's esophagus   . Benign prostatic hypertrophy   . Colitis, ischemic (HCC)    X 2  . Complication of anesthesia    SLOW TO AWAKEN AFTER 1 SURGERY 20  YRS AGO  . DJD (degenerative joint disease)    SPINE, AND OA  . GERD (gastroesophageal reflux disease)   . HOH (hard of hearing)    BOTH EARS  . Hyperlipidemia   . Hypertension    pt denies says cardura for bladder  . Neuropathy    LEFT HAND NUMB AND BURNS ALL THE TIME  . Overweight(278.02)    Past Surgical History:  Procedure Laterality Date  . 2 neck surgeries  2005-2006   Dr. Georgia Dom cx decompression, diskectomy,fusion C6-7 allograftWITH PLATE  . APPENDECTOMY    . FOOT SURGERY     right x 3  . LAPAROSCOPIC CHOLECYSTECTOMY  1993   Dr. Luan Moore  . left total knee replacement  07/2007   Dr. Eulah Pont  . right total knee replacement  2012   Dr. Despina Hick  . SEPTOPLASTY  1975, May 2013   x3  . TONSILLECTOMY  as child  . TOTAL KNEE REVISION Left 11/11/2015   Procedure: LEFT KNEE POLYETHELENE REVISION;  Surgeon: Ollen Gross, MD;  Location: [facility];  Service: Orthopedics;  Laterality: Left;  . TOTAL KNEE REVISION Left 12/07/2016   Procedure: LEFT  TOTAL KNEE REVISION;  Surgeon: Ollen Gross, MD;  Location: [facility];  Service: Orthopedics;  Laterality: Left;  Adductor Block    reports that he has never smoked. He has never used smokeless tobacco. He reports that he does not drink alcohol or use drugs. family history is not on file. No Known Allergies   Review of Systems  Constitutional: Negative for fatigue.  Eyes: Negative for visual disturbance.  Respiratory: Negative for cough, chest tightness and shortness of breath.   Cardiovascular: Negative for chest pain, palpitations and leg swelling.  Gastrointestinal: Negative for abdominal pain.  Endocrine: Negative for polydipsia and polyuria.  Musculoskeletal: Positive for arthralgias.  Neurological: Negative for dizziness, syncope, weakness, light-headedness and headaches.       Objective:   Physical Exam  Constitutional: He is oriented to person, place, and time. He appears well-developed and well-nourished.  HENT:  Right Ear: External ear normal.  Left Ear: External ear normal.  Mouth/Throat: Oropharynx is clear and moist.  Eyes: Pupils are equal, round, and reactive to light.  Neck: Neck supple. No thyromegaly present.  No carotid bruits  Cardiovascular: Normal rate and regular rhythm.  Pulmonary/Chest: Effort normal and breath sounds normal. No respiratory distress. He has no wheezes. He has no rales.  Abdominal: Soft. There is  no tenderness.  Musculoskeletal: He exhibits no edema.  Neurological: He is alert and oriented to person, place, and time.       Assessment:     Presurgical clearance. Patient has no history of any chronic lung or heart issues. EKG shows mild bradycardia with heart rate 57. He has some nonspecific T-wave abnormalities. A1c 5.7%    Plan:     -forms completed for surgical clearance. -Hopefully he can get more active after his hip replacement and eventually lose some weight -He will stop aspirin 5 days prior to surgery.  Kristian Covey  MD [facility] at [facility]

## 2018-02-07 NOTE — Patient Instructions (Addendum)
[name] , Thank you for taking time to come for your Medicare Wellness Visit. I appreciate your ongoing commitment to your health goals. Please review the following plan we discussed and let me know if I can assist you in the future.   Will take your high dose flu vaccine today   Take our shingrix 2nd vaccine within 6 months of the first (11/24/2017)   A Tetanus is recommended every 10 years. Medicare covers a tetanus if you have a cut or wound; otherwise, there may be a charge. If you had not had a tetanus with pertusses, known as the Tdap, you can take this anytime.   Deaf & Hard of Hearing Division Services - can assist with hearing aid x 1  No reviews  [facility]  [address] [place] [address]  [phone] - [place]   [email or web address]-[email or web address]/[email or web address]/[email or web address]_How_to_Buy_Hearing_Aids.pdf     These are the goals we discussed: Goals    . Patient Stated     Continue to exercise and live healthy  Less pain and more trips        This is a list of the screening recommended for you and due dates:  Health Maintenance  Topic Date Due  . Flu Shot  12/28/2017  . Tetanus Vaccine  06/23/2019  . Pneumonia vaccines  Completed      Fall Prevention in the Home Falls can cause injuries. They can happen to people of all ages. There are many things you can do to make your home safe and to help prevent falls. What can I do on the outside of my home?  Regularly fix the edges of walkways and driveways and fix any cracks.  Remove anything that might make you trip as you walk through a door, such as a raised step or threshold.  Trim any bushes or trees on the path to your home.  Use bright outdoor lighting.  Clear any walking paths of anything that might make someone trip, such as rocks or tools.  Regularly check to see if handrails are loose or broken. Make sure that both sides of any steps have handrails.  Any raised decks  and porches should have guardrails on the edges.  Have any leaves, snow, or ice cleared regularly.  Use sand or salt on walking paths during winter.  Clean up any spills in your garage right away. This includes oil or grease spills. What can I do in the bathroom?  Use night lights.  Install grab bars by the toilet and in the tub and shower. Do not use towel bars as grab bars.  Use non-skid mats or decals in the tub or shower.  If you need to sit down in the shower, use a plastic, non-slip stool.  Keep the floor dry. Clean up any water that spills on the floor as soon as it happens.  Remove soap buildup in the tub or shower regularly.  Attach bath mats securely with double-sided non-slip rug tape.  Do not have throw rugs and other things on the floor that can make you trip. What can I do in the bedroom?  Use night lights.  Make sure that you have a light by your bed that is easy to reach.  Do not use any sheets or blankets that are too big for your bed. They should not hang down onto the floor.  Have a firm chair that has side arms. You can use this for support while you get dressed.  Do not  have throw rugs and other things on the floor that can make you trip. What can I do in the kitchen?  Clean up any spills right away.  Avoid walking on wet floors.  Keep items that you use a lot in easy-to-reach places.  If you need to reach something above you, use a strong step stool that has a grab bar.  Keep electrical cords out of the way.  Do not use floor polish or wax that makes floors slippery. If you must use wax, use non-skid floor wax.  Do not have throw rugs and other things on the floor that can make you trip. What can I do with my stairs?  Do not leave any items on the stairs.  Make sure that there are handrails on both sides of the stairs and use them. Fix handrails that are broken or loose. Make sure that handrails are as long as the stairways.  Check any  carpeting to make sure that it is firmly attached to the stairs. Fix any carpet that is loose or worn.  Avoid having throw rugs at the top or bottom of the stairs. If you do have throw rugs, attach them to the floor with carpet tape.  Make sure that you have a light switch at the top of the stairs and the bottom of the stairs. If you do not have them, ask someone to add them for you. What else can I do to help prevent falls?  Wear shoes that: ? Do not have high heels. ? Have rubber bottoms. ? Are comfortable and fit you well. ? Are closed at the toe. Do not wear sandals.  If you use a stepladder: ? Make sure that it is fully opened. Do not climb a closed stepladder. ? Make sure that both sides of the stepladder are locked into place. ? Ask someone to hold it for you, if possible.  Clearly mark and make sure that you can see: ? Any grab bars or handrails. ? First and last steps. ? Where the edge of each step is.  Use tools that help you move around (mobility aids) if they are needed. These include: ? Canes. ? Walkers. ? Scooters. ? Crutches.  Turn on the lights when you go into a dark area. Replace any light bulbs as soon as they burn out.  Set up your furniture so you have a clear path. Avoid moving your furniture around.  If any of your floors are uneven, fix them.  If there are any pets around you, be aware of where they are.  Review your medicines with your doctor. Some medicines can make you feel dizzy. This can increase your chance of falling. Ask your doctor what other things that you can do to help prevent falls. This information is not intended to replace advice given to you by your health care provider. Make sure you discuss any questions you have with your health care provider. Document Released: 03/12/2009 Document Revised: 10/22/2015 Document Reviewed: 06/20/2014 Elsevier Interactive Patient Education  2018 [employer].   Health Maintenance, Male A healthy  lifestyle and preventive care is important for your health and wellness. Ask your health care provider about what schedule of regular examinations is right for you. What should I know about weight and diet? Eat a Healthy Diet  Eat plenty of vegetables, fruits, whole grains, low-fat dairy products, and lean protein.  Do not eat a lot of foods high in solid fats, added sugars, or salt.  Maintain a Healthy Weight Regular exercise can help you achieve or maintain a healthy weight. You should:  Do at least 150 minutes of exercise each week. The exercise should increase your heart rate and make you sweat (moderate-intensity exercise).  Do strength-training exercises at least twice a week.  Watch Your Levels of Cholesterol and Blood Lipids  Have your blood tested for lipids and cholesterol every 5 years starting at 81 years of age. If you are at high risk for heart disease, you should start having your blood tested when you are 81 years old. You may need to have your cholesterol levels checked more often if: ? Your lipid or cholesterol levels are high. ? You are older than 81 years of age. ? You are at high risk for heart disease.  What should I know about cancer screening? Many types of cancers can be detected early and may often be prevented. Lung Cancer  You should be screened every year for lung cancer if: ? You are a current smoker who has smoked for at least 30 years. ? You are a former smoker who has quit within the past 15 years.  Talk to your health care provider about your screening options, when you should start screening, and how often you should be screened.  Colorectal Cancer  Routine colorectal cancer screening usually begins at 80 years of age and should be repeated every 5-10 years until you are 81 years old. You may need to be screened more often if early forms of precancerous polyps or small growths are found. Your health care provider may recommend screening at an  earlier age if you have risk factors for colon cancer.  Your health care provider may recommend using home test kits to check for hidden blood in the stool.  A small camera at the end of a tube can be used to examine your colon (sigmoidoscopy or colonoscopy). This checks for the earliest forms of colorectal cancer.  Prostate and Testicular Cancer  Depending on your age and overall health, your health care provider may do certain tests to screen for prostate and testicular cancer.  Talk to your health care provider about any symptoms or concerns you have about testicular or prostate cancer.  Skin Cancer  Check your skin from head to toe regularly.  Tell your health care provider about any new moles or changes in moles, especially if: ? There is a change in a mole's size, shape, or color. ? You have a mole that is larger than a pencil eraser.  Always use sunscreen. Apply sunscreen liberally and repeat throughout the day.  Protect yourself by wearing long sleeves, pants, a wide-brimmed hat, and sunglasses when outside.  What should I know about heart disease, diabetes, and high blood pressure?  If you are 54-37 years of age, have your blood pressure checked every 3-5 years. If you are 54 years of age or older, have your blood pressure checked every year. You should have your blood pressure measured twice-once when you are at a hospital or clinic, and once when you are not at a hospital or clinic. Record the average of the two measurements. To check your blood pressure when you are not at a hospital or clinic, you can use: ? An automated blood pressure machine at a pharmacy. ? A home blood pressure monitor.  Talk to your health care provider about your target blood pressure.  If you are between 79-6 years old, ask your health care provider if you  should take aspirin to prevent heart disease.  Have regular diabetes screenings by checking your fasting blood sugar level. ? If you are at  a normal weight and have a low risk for diabetes, have this test once every three years after the age of 71. ? If you are overweight and have a high risk for diabetes, consider being tested at a younger age or more often.  A one-time screening for abdominal aortic aneurysm (AAA) by ultrasound is recommended for men aged 65-75 years who are current or former smokers. What should I know about preventing infection? Hepatitis B If you have a higher risk for hepatitis B, you should be screened for this virus. Talk with your health care provider to find out if you are at risk for hepatitis B infection. Hepatitis C Blood testing is recommended for:  Everyone born from [birth year] through 1963.  Anyone with known risk factors for hepatitis C.  Sexually Transmitted Diseases (STDs)  You should be screened each year for STDs including gonorrhea and chlamydia if: ? You are sexually active and are younger than 81 years of age. ? You are older than 81 years of age and your health care provider tells you that you are at risk for this type of infection. ? Your sexual activity has changed since you were last screened and you are at an increased risk for chlamydia or gonorrhea. Ask your health care provider if you are at risk.  Talk with your health care provider about whether you are at high risk of being infected with HIV. Your health care provider may recommend a prescription medicine to help prevent HIV infection.  What else can I do?  Schedule regular health, dental, and eye exams.  Stay current with your vaccines (immunizations).  Do not use any tobacco products, such as cigarettes, chewing tobacco, and e-cigarettes. If you need help quitting, ask your health care provider.  Limit alcohol intake to no more than 2 drinks per day. One drink equals 12 ounces of beer, 5 ounces of wine, or 1 ounces of hard liquor.  Do not use street drugs.  Do not share needles.  Ask your health care provider for help  if you need support or information about quitting drugs.  Tell your health care provider if you often feel depressed.  Tell your health care provider if you have ever been abused or do not feel safe at home. This information is not intended to replace advice given to you by your health care provider. Make sure you discuss any questions you have with your health care provider. Document Released: 11/12/2007 Document Revised: 01/13/2016 Document Reviewed: 02/17/2015 Elsevier Interactive Patient Education  [employer].

## 2018-02-21 ENCOUNTER — Other Ambulatory Visit: Payer: Self-pay | Admitting: Family Medicine

## 2018-02-26 ENCOUNTER — Encounter: Payer: Self-pay | Admitting: Podiatry

## 2018-02-26 ENCOUNTER — Ambulatory Visit: Payer: Medicare Other | Admitting: Podiatry

## 2018-02-26 DIAGNOSIS — M79675 Pain in left toe(s): Secondary | ICD-10-CM

## 2018-02-26 DIAGNOSIS — B351 Tinea unguium: Secondary | ICD-10-CM

## 2018-02-26 DIAGNOSIS — D689 Coagulation defect, unspecified: Secondary | ICD-10-CM | POA: Diagnosis not present

## 2018-02-26 DIAGNOSIS — M79674 Pain in right toe(s): Secondary | ICD-10-CM | POA: Diagnosis not present

## 2018-02-26 DIAGNOSIS — L84 Corns and callosities: Secondary | ICD-10-CM | POA: Diagnosis not present

## 2018-02-26 NOTE — Progress Notes (Signed)
Subjective:   Patient ID: Isaiah Wilson, male   DOB: 83 y.o.   MRN: [id number]   HPI Patient presents with significant nail disease 1-5 both feet and lesions sub-first metatarsal right that is painful   ROS      Objective:  Physical Exam  Neurovascular status unchanged with thick yellow brittle nailbeds that are painful 1-5 both feet and lesions sub-first metatarsal right     Assessment:  Chronic mycotic nail infection with pain lesion sub-right     Plan:  Debrided nailbeds 1-5 both feet with no iatrogenic bleeding and lesion right with no iatrogenic bleeding and reappoint for routine care

## 2018-03-06 DIAGNOSIS — H66002 Acute suppurative otitis media without spontaneous rupture of ear drum, left ear: Secondary | ICD-10-CM | POA: Insufficient documentation

## 2018-03-16 ENCOUNTER — Ambulatory Visit: Payer: Medicare Other | Admitting: Family Medicine

## 2018-03-16 ENCOUNTER — Encounter: Payer: Self-pay | Admitting: Family Medicine

## 2018-03-16 ENCOUNTER — Ambulatory Visit (INDEPENDENT_AMBULATORY_CARE_PROVIDER_SITE_OTHER): Payer: Medicare Other

## 2018-03-16 VITALS — BP 128/80 | HR 69 | Temp 99.3°F | Wt 250.2 lb

## 2018-03-16 DIAGNOSIS — R05 Cough: Secondary | ICD-10-CM

## 2018-03-16 DIAGNOSIS — J181 Lobar pneumonia, unspecified organism: Secondary | ICD-10-CM | POA: Diagnosis not present

## 2018-03-16 DIAGNOSIS — R059 Cough, unspecified: Secondary | ICD-10-CM

## 2018-03-16 DIAGNOSIS — R509 Fever, unspecified: Secondary | ICD-10-CM | POA: Diagnosis not present

## 2018-03-16 DIAGNOSIS — J189 Pneumonia, unspecified organism: Secondary | ICD-10-CM

## 2018-03-16 MED ORDER — CEFTRIAXONE SODIUM 1 G IJ SOLR
1.0000 g | Freq: Once | INTRAMUSCULAR | Status: AC
  Administered 2018-03-16: 1 g via INTRAMUSCULAR

## 2018-03-16 MED ORDER — LEVOFLOXACIN 500 MG PO TABS: 500.0000 mg | ORAL_TABLET | Freq: Every day | ORAL | 0 refills | Status: AC

## 2018-03-16 NOTE — Addendum Note (Signed)
Addended by: Marcellus Scott on: 03/16/2018 01:22 PM   Modules accepted: Orders

## 2018-03-16 NOTE — Progress Notes (Signed)
   Subjective:    Patient ID: Isaiah Wilson, male    DOB: 1935/04/13, 83 y.o.   MRN: [id number]  HPI Here for 2 days of fever to 100 degrees, coughing up green sputum, a pain in the right middle back area, dry heaving, and diarrhea. Drinking fluids but not eating much food. No urinary symptoms.    Review of Systems  Constitutional: Positive for chills and fever. Negative for diaphoresis.  HENT: Negative.   Eyes: Negative.   Respiratory: Positive for cough, chest tightness and shortness of breath. Negative for wheezing.   Cardiovascular: Negative.   Gastrointestinal: Positive for diarrhea and nausea. Negative for abdominal distention, abdominal pain, blood in stool, constipation and vomiting.  Genitourinary: Negative.        Objective:   Physical Exam  Constitutional:  Appears ill, alert   HENT:  Right Ear: External ear normal.  Left Ear: External ear normal.  Nose: Nose normal.  Mouth/Throat: Oropharynx is clear and moist. No oropharyngeal exudate.  Eyes: Conjunctivae are normal.  Neck: Neck supple. No thyromegaly present.  Pulmonary/Chest: Effort normal. No stridor. No respiratory distress. He has no wheezes.  Rales are present in the right posterior base   Abdominal: Soft. Bowel sounds are normal. He exhibits no distension and no mass. There is no tenderness. There is no rebound and no guarding. No hernia.  Lymphadenopathy:    He has no cervical adenopathy.  CXR shows a slight infiltrate in the RLL.         Assessment & Plan:  RLL pneumonia. Given a Rocephin shot. Follow with Levaquin for 10 days. Drink plenty of fluids. Recheck next week.  Gershon Crane, MD

## 2018-03-20 NOTE — H&P (Signed)
TOTAL HIP ADMISSION H&P  Patient is admitted for left total hip arthroplasty.  Subjective:  Chief Complaint: left hip pain  HPI: Isaiah Wilson, 83 y.o. male, has a history of pain and functional disability in the left hip(s) due to arthritis and patient has failed non-surgical conservative treatments for greater than 12 weeks to include corticosteriod injections, use of assistive devices and activity modification.  Onset of symptoms was abrupt starting 1 years ago with rapidly worsening course since that time.The patient noted no past surgery on the left hip(s).  Patient currently rates pain in the left hip at 8 out of 10 with activity. Patient has night pain, worsening of pain with activity and weight bearing and instability. Patient has evidence of bone-on-bone arthritis by imaging studies. This condition presents safety issues increasing the risk of falls. There is no current active infection.  Patient Active Problem List   Diagnosis Date Noted  . Acute serous otitis media of left ear 06/20/2017  . Conductive hearing loss of both ears 06/20/2017  . History of total bilateral knee replacement 06/18/2017  . History of total knee replacement, right 06/13/2017  . Pain in left knee 05/31/2017  . OA (osteoarthritis) of knee 12/07/2016  . 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 Eustachian tube dysfunction, bilateral 07/15/2015  . Suppurative otitis media of right ear without rupture of tympanic membrane 07/15/2015  . Obesity (BMI 30-39.9) 02/17/2014  . Seborrheic keratoses 02/08/2013  . Paresthesia 02/09/2011  . OVERWEIGHT 07/05/2008  . HIATAL HERNIA 09/25/2007  . BPH (benign prostatic hypertrophy) 07/14/2007  . ISCHEMIC COLITIS 07/12/2007  . HYPERLIPIDEMIA 05/07/2007  . ANXIETY 05/07/2007  . NEUROPATHY 05/07/2007  . ALLERGIC RHINITIS 05/07/2007  . GERD 05/07/2007  . BARRETTS ESOPHAGUS 05/07/2007  . Osteoarthritis 05/07/2007    Past Medical History:  Diagnosis Date  . Abnormal EKG    hx left anterior fasicular block on old ekg 2017  . Allergic rhinitis   . Allergy   . Barrett's esophagus   . Benign prostatic hypertrophy   . Colitis, ischemic (HCC)    X 2  . Complication of anesthesia    SLOW TO AWAKEN AFTER 1 SURGERY 20  YRS AGO  . DJD (degenerative joint disease)    SPINE, AND OA  . GERD (gastroesophageal reflux disease)   . HOH (hard of hearing)    BOTH EARS  . Hyperlipidemia   . Hypertension    pt denies says cardura for bladder  . Neuropathy    LEFT HAND NUMB AND BURNS ALL THE TIME  . Overweight(278.02)     Past Surgical History:  Procedure Laterality Date  . 2 neck surgeries  2005-2006   Dr. Georgia Dom cx decompression, diskectomy,fusion C6-7 allograftWITH PLATE  . APPENDECTOMY    . FOOT SURGERY     right x 3  . LAPAROSCOPIC CHOLECYSTECTOMY  1993   Dr. Luan Moore  . left total knee replacement  07/2007   Dr. Eulah Pont  . right total knee replacement  2012   Dr. Despina Hick  . SEPTOPLASTY  1975, May 2013   x3  . TONSILLECTOMY  as child  . TOTAL KNEE REVISION Left 11/11/2015   Procedure: LEFT KNEE POLYETHELENE REVISION;  Surgeon: Ollen Gross, MD;  Location: [facility];  Service: Orthopedics;  Laterality: Left;  . TOTAL KNEE REVISION Left 12/07/2016   Procedure: LEFT TOTAL KNEE REVISION;  Surgeon: Ollen Gross, MD;  Location: [facility];  Service: Orthopedics;  Laterality:  Left;  Adductor Block    No current facility-administered medications for this encounter.    Current Outpatient Medications  Medication Sig Dispense Refill Last Dose  . acetaminophen (TYLENOL) 650 MG CR tablet Take 1,300 mg by mouth every 8 (eight) hours as needed for pain.   Taking  . aspirin EC 81 MG tablet Take by mouth.   Taking  . azelastine (ASTELIN) 0.1 % nasal spray Place 2 sprays into both nostrils every evening. Use in each nostril as directed    Taking  . doxazosin (CARDURA) 8 MG tablet TAKE ONE TABLET BY MOUTH EVERY  NIGHT AT BEDTIME 90 tablet 0 Taking  . EPINEPHrine 0.3 mg/0.3 mL IJ SOAJ injection Inject 0.3 mg into the muscle as needed (allergic reaction).    Taking  . fluticasone (FLONASE) 50 MCG/ACT nasal spray Place 2 sprays into both nostrils daily.    Taking  . gabapentin (NEURONTIN) 300 MG capsule Take 300 mg by mouth 2 (two) times daily.   Taking  . HYDROcodone-acetaminophen (NORCO/VICODIN) 5-325 MG tablet Take 2 tablets by mouth every morning.   Taking  . levofloxacin (LEVAQUIN) 500 MG tablet Take 1 tablet (500 mg total) by mouth daily for 10 days. 10 tablet 0   . loratadine (CLARITIN) 10 MG tablet Take 10 mg by mouth.   Taking  . montelukast (SINGULAIR) 10 MG tablet TAKE ONE TABLET BY MOUTH EVERY NIGHT AT BEDTIME 90 tablet 2 Taking  . Multiple Vitamins-Minerals (CENTRUM ADULTS PO) Take by mouth.   Taking  . NEXIUM 40 MG capsule TAKE ONE CAPSULE BY MOUTH EVERY MORNING BEFORE BREAKFAST 90 capsule 2 Taking  . traMADol (ULTRAM) 50 MG tablet Take 50 mg by mouth every 6 (six) hours as needed.   Taking  . zinc gluconate 50 MG tablet Take 50 mg by mouth daily.   Taking   No Known Allergies  Social History   Tobacco Use  . Smoking status: Never Smoker  . Smokeless tobacco: Never Used  Substance Use Topics  . Alcohol use: No    Alcohol/week: 0.0 standard drinks    Family History  Problem Relation Age of Onset  . Colon cancer Neg Hx   . Stomach cancer Neg Hx   . Rectal cancer Neg Hx   . Esophageal cancer Neg Hx      Review of Systems  Constitutional: Negative for chills and fever.  HENT: Negative for congestion, sore throat and tinnitus.   Eyes: Negative for double vision, photophobia and pain.  Respiratory: Negative for cough, shortness of breath and wheezing.   Cardiovascular: Negative for chest pain, palpitations and orthopnea.  Gastrointestinal: Negative for heartburn, nausea and vomiting.  Genitourinary: Negative for dysuria, frequency and urgency.  Musculoskeletal: Positive for joint  pain.  Neurological: Negative for dizziness, weakness and headaches.  Psychiatric/Behavioral: Insomnia:       Objective:  Physical Exam  Well nourished and well developed. General: Alert and oriented x3, cooperative and pleasant, no acute distress. Head: normocephalic, atraumatic, neck supple. Eyes: EOMI Respiratory: breath sounds clear in all fields, no wheezing, rales, or rhonchi. Cardiovascular: Regular rate and rhythm, no murmurs, gallops or rubs.  Abdomen: non-tender to palpation and soft, normoactive bowel sounds. Musculoskeletal: Gait pattern is significantly antalgic on the left. Left Hip Exam: ROM: Flexion to 90, Internal Rotation 0, External Rotation 0, and Abduction 5 with pain radiating down his entire leg. There is no tenderness over the greater trochanter bursa.  Calves soft and nontender. Motor function intact in LE. Strength  5/5 LE bilaterally. Neuro: Distal pulses 2+. Sensation to light touch intact in LE.  Vital signs in last 24 hours: Blood pressure: 142/92 mmHg Pulse: 68 bpm   Labs:   Estimated body mass index is 36.42 kg/m as calculated from the following:   Height as of 02/07/18: 5' 9.5" (1.765 m).   Weight as of 03/16/18: 113.5 kg.   Imaging Review Plain radiographs demonstrate severe degenerative joint disease of the left hip(s). The bone quality appears to be adequate for age and reported activity level.    Preoperative templating of the joint replacement has been completed, documented, and submitted to the Operating Room personnel in order to optimize intra-operative equipment management.     Assessment/Plan:  End stage arthritis, left hip(s)  The patient history, physical examination, clinical judgement of the provider and imaging studies are consistent with end stage degenerative joint disease of the left hip(s) and total hip arthroplasty is deemed medically necessary. The treatment options including medical management, injection therapy,  arthroscopy and arthroplasty were discussed at length. The risks and benefits of total hip arthroplasty were presented and reviewed. The risks due to aseptic loosening, infection, stiffness, dislocation/subluxation,  thromboembolic complications and other imponderables were discussed.  The patient acknowledged the explanation, agreed to proceed with the plan and consent was signed. Patient is being admitted for inpatient treatment for surgery, pain control, PT, OT, prophylactic antibiotics, VTE prophylaxis, progressive ambulation and ADL's and discharge planning.The patient is planning to be discharged home with home health services.  Therapy Plans: HHPT Disposition: Home with wife Planned DVT Prophylaxis: Aspirin 325 mg BID DME needed: 3-n-1, walker PCP: Dr. Evelena Peat TXA: IV Allergies: NKDA Anesthesia Concerns: None Last HgbA1c: 5.7%  - Patient was instructed on what medications to stop prior to surgery. - Follow-up visit in 2 weeks with Dr. Lequita Halt - Begin physical therapy following surgery - Pre-operative lab work as pre-surgical testing - Prescriptions will be provided in hospital at time of discharge  Arther Abbott, PA-C Orthopedic Surgery [facility] [place]

## 2018-03-21 ENCOUNTER — Encounter: Payer: Self-pay | Admitting: Family Medicine

## 2018-03-21 ENCOUNTER — Ambulatory Visit: Payer: Medicare Other | Admitting: Family Medicine

## 2018-03-21 VITALS — BP 132/72 | HR 66 | Temp 98.3°F | Wt 252.1 lb

## 2018-03-21 DIAGNOSIS — J189 Pneumonia, unspecified organism: Secondary | ICD-10-CM

## 2018-03-21 DIAGNOSIS — J181 Lobar pneumonia, unspecified organism: Secondary | ICD-10-CM

## 2018-03-21 NOTE — Progress Notes (Signed)
   Subjective:    Patient ID: Isaiah Wilson, male    DOB: 1935/02/26, 83 y.o.   MRN: [id number]  HPI Here to follow up on a RLL pneumonia. He was seen here on 03-16-18 for a deep cough and rales were heard on exam. A CXR that day was clear. He was given a Rocephin shot and was started on Levaquin. Since then he has felt much better. The cough is easing up, the back pain is better, and he has more energy. His appetite is returning.    Review of Systems  Constitutional: Negative.   Respiratory: Positive for cough. Negative for chest tightness, shortness of breath and wheezing.   Cardiovascular: Negative.   Neurological: Negative.        Objective:   Physical Exam  Constitutional: He is oriented to person, place, and time. He appears well-developed and well-nourished.  He appears to be much improved   Cardiovascular: Normal rate, regular rhythm, normal heart sounds and intact distal pulses.  Pulmonary/Chest: Effort normal and breath sounds normal. No stridor. No respiratory distress. He has no wheezes. He has no rales.  Neurological: He is alert and oriented to person, place, and time.          Assessment & Plan:  RLL pneumonia, responding to treatment. He will finish out the antibiotic.  Gershon Crane, MD

## 2018-03-27 ENCOUNTER — Encounter: Payer: Self-pay | Admitting: Internal Medicine

## 2018-03-28 ENCOUNTER — Ambulatory Visit: Payer: Medicare Other | Admitting: Family Medicine

## 2018-03-28 ENCOUNTER — Other Ambulatory Visit: Payer: Self-pay

## 2018-03-28 ENCOUNTER — Encounter: Payer: Self-pay | Admitting: Family Medicine

## 2018-03-28 VITALS — BP 126/76 | HR 66 | Temp 98.1°F | Wt 246.5 lb

## 2018-03-28 DIAGNOSIS — J209 Acute bronchitis, unspecified: Secondary | ICD-10-CM | POA: Diagnosis not present

## 2018-03-28 NOTE — Progress Notes (Signed)
  Subjective:     Patient ID: Isaiah Wilson, male   DOB: 03/22/1935, 83 y.o.   MRN: [id number]  HPI Patient seen with some persistent cough.  He was seen here on the 18th and diagnosed with presumptive right lower lobe pneumonia.  Chest x-ray did not show any clear pneumonia.  He had received Rocephin followed by Levaquin for 10 days.  Was then seen back in follow-up on the 23rd.  Overall slightly better but still has some cough.  No fever.  No shortness of breath.  Has orthopedic surgery coming up in a month  Past Medical History:  Diagnosis Date  . Abnormal EKG    hx left anterior fasicular block on old ekg 2017  . Allergic rhinitis   . Allergy   . Barrett's esophagus   . Benign prostatic hypertrophy   . Colitis, ischemic (HCC)    X 2  . Complication of anesthesia    SLOW TO AWAKEN AFTER 1 SURGERY 20  YRS AGO  . DJD (degenerative joint disease)    SPINE, AND OA  . GERD (gastroesophageal reflux disease)   . HOH (hard of hearing)    BOTH EARS  . Hyperlipidemia   . Hypertension    pt denies says cardura for bladder  . Neuropathy    LEFT HAND NUMB AND BURNS ALL THE TIME  . Overweight(278.02)    Past Surgical History:  Procedure Laterality Date  . 2 neck surgeries  2005-2006   Dr. Georgia Dom cx decompression, diskectomy,fusion C6-7 allograftWITH PLATE  . APPENDECTOMY    . FOOT SURGERY     right x 3  . LAPAROSCOPIC CHOLECYSTECTOMY  1993   Dr. Luan Moore  . left total knee replacement  07/2007   Dr. Eulah Pont  . right total knee replacement  2012   Dr. Despina Hick  . SEPTOPLASTY  1975, May 2013   x3  . TONSILLECTOMY  as child  . TOTAL KNEE REVISION Left 11/11/2015   Procedure: LEFT KNEE POLYETHELENE REVISION;  Surgeon: Ollen Gross, MD;  Location: [facility];  Service: Orthopedics;  Laterality: Left;  . TOTAL KNEE REVISION Left 12/07/2016   Procedure: LEFT TOTAL KNEE REVISION;  Surgeon: Ollen Gross, MD;  Location: [facility];  Service: Orthopedics;  Laterality: Left;  Adductor Block     reports that he has never smoked. He has never used smokeless tobacco. He reports that he does not drink alcohol or use drugs. family history is not on file. No Known Allergies    Review of Systems  Constitutional: Negative for chills and fever.  Respiratory: Positive for cough. Negative for shortness of breath.        Objective:   Physical Exam  Constitutional: He appears well-developed and well-nourished.  Cardiovascular: Normal rate.  Pulmonary/Chest: Effort normal and breath sounds normal. He has no wheezes. He has no rales.       Assessment:     Cough.  Recent bronchial illness.  No reactive airway disease and no respiratory distress.  Suspect residual from recent illness    Plan:     -Try over-the-counter Mucinex twice daily -Follow-up promptly for any fever, increased shortness of breath, or other concerns.  Touch base in 2 weeks if cough not further improved  Kristian Covey MD [facility]

## 2018-03-28 NOTE — Patient Instructions (Signed)
Take the Mucinex as directed.  Follow up for any fever or increased shortness of breath.

## 2018-04-06 ENCOUNTER — Other Ambulatory Visit (HOSPITAL_COMMUNITY): Payer: Self-pay | Admitting: *Deleted

## 2018-04-06 NOTE — Progress Notes (Signed)
EKG 02-07-18 Epic  CHEST XRAY 03-16-18 Epic,  MEDICAL CLEARANCE DR Caryl Never 02-07-18 ON CHART

## 2018-04-06 NOTE — Patient Instructions (Addendum)
Isaiah Wilson  04/06/2018   Your procedure is scheduled on: 04-18-18  Report to [facility] Main  Entrance  Report to admitting at 1100 AM    Call this number if you have problems the morning of surgery [phone]   Remember: Do not eat food  :After Midnight. May have clear liquids from midnight until 730 am day of surgery. Nothing by mouth after 730 am day of surgery. BRUSH YOUR TEETH MORNING OF SURGERY AND RINSE YOUR MOUTH OUT, NO CHEWING GUM CANDY OR MINTS.     CLEAR LIQUID DIET   Foods Allowed                                                                     Foods Excluded  Coffee and tea, regular and decaf                             liquids that you cannot  Plain Jell-O in any flavor                                             see through such as: Fruit ices (not with fruit pulp)                                     milk, soups, orange juice  Iced Popsicles                                    All solid food Carbonated beverages, regular and diet                                    Cranberry, grape and apple juices Sports drinks like Gatorade Lightly seasoned clear broth or consume(fat free) Sugar, honey syrup  Sample Menu Breakfast                                Lunch                                     Supper Cranberry juice                    Beef broth                            Chicken broth Jell-O                                     Grape juice  Apple juice Coffee or tea                        Jell-O                                      Popsicle                                                Coffee or tea                        Coffee or tea  _____________________________________________________________________     Take these medicines the morning of surgery with A SIP OF WATER: FLONASE NASAL SPRAY, GABAPENTIN (NEURONTIN),  LORATADINE (CLARITIN), EYE DROP, ES TYLENOL                                You may not  have any metal on your body including hair pins and              piercings  Do not wear jewelry, make-up, lotions, powders or perfumes, deodorant             Do not wear nail polish.  Do not shave  48 hours prior to surgery.              Men may shave face and neck.   Do not bring valuables to the hospital. [facility] IS NOT             RESPONSIBLE   FOR VALUABLES.  Contacts, dentures or bridgework may not be worn into surgery.  Leave suitcase in the car. After surgery it may be brought to your room.                 Please read over the following fact sheets you were given: _____________________________________________________________________  [facility] - Preparing for Surgery Before surgery, you can play an important role.  Because skin is not sterile, your skin needs to be as free of germs as possible.  You can reduce the number of germs on your skin by washing with CHG (chlorahexidine gluconate) soap before surgery.  CHG is an antiseptic cleaner which kills germs and bonds with the skin to continue killing germs even after washing. Please DO NOT use if you have an allergy to CHG or antibacterial soaps.  If your skin becomes reddened/irritated stop using the CHG and inform your nurse when you arrive at Short Stay. Do not shave (including legs and underarms) for at least 48 hours prior to the first CHG shower.  You may shave your face/neck. Please follow these instructions carefully:  1.  Shower with CHG Soap the night before surgery and the  morning of Surgery.  2.  If you choose to wash your hair, wash your hair first as usual with your  normal  shampoo.  3.  After you shampoo, rinse your hair and body thoroughly to remove the  shampoo.                           4.  Use CHG as you would any other liquid  soap.  You can apply chg directly  to the skin and wash                       Gently with a scrungie or clean washcloth.  5.  Apply the CHG Soap to your body ONLY FROM THE NECK DOWN.   Do  not use on face/ open                           Wound or open sores. Avoid contact with eyes, ears mouth and genitals (private parts).                       Wash face,  Genitals (private parts) with your normal soap.             6.  Wash thoroughly, paying special attention to the area where your surgery  will be performed.  7.  Thoroughly rinse your body with warm water from the neck down.  8.  DO NOT shower/wash with your normal soap after using and rinsing off  the CHG Soap.                9.  Pat yourself dry with a clean towel.            10.  Wear clean pajamas.            11.  Place clean sheets on your bed the night of your first shower and do not  sleep with pets. Day of Surgery : Do not apply any lotions/deodorants the morning of surgery.  Please wear clean clothes to the hospital/surgery center.  FAILURE TO FOLLOW THESE INSTRUCTIONS MAY RESULT IN THE CANCELLATION OF YOUR SURGERY PATIENT SIGNATURE_________________________________  NURSE SIGNATURE__________________________________  ________________________________________________________________________   Rogelia Mire  An incentive spirometer is a tool that can help keep your lungs clear and active. This tool measures how well you are filling your lungs with each breath. Taking long deep breaths may help reverse or decrease the chance of developing breathing (pulmonary) problems (especially infection) following:  A long period of time when you are unable to move or be active. BEFORE THE PROCEDURE   If the spirometer includes an indicator to show your best effort, your nurse or respiratory therapist will set it to a desired goal.  If possible, sit up straight or lean slightly forward. Try not to slouch.  Hold the incentive spirometer in an upright position. INSTRUCTIONS FOR USE  1. Sit on the edge of your bed if possible, or sit up as far as you can in bed or on a chair. 2. Hold the incentive spirometer in an upright  position. 3. Breathe out normally. 4. Place the mouthpiece in your mouth and seal your lips tightly around it. 5. Breathe in slowly and as deeply as possible, raising the piston or the ball toward the top of the column. 6. Hold your breath for 3-5 seconds or for as long as possible. Allow the piston or ball to fall to the bottom of the column. 7. Remove the mouthpiece from your mouth and breathe out normally. 8. Rest for a few seconds and repeat Steps 1 through 7 at least 10 times every 1-2 hours when you are awake. Take your time and take a few normal breaths between deep breaths. 9. The spirometer may include an indicator to show your best effort. Use the indicator as a goal  to work toward during each repetition. 10. After each set of 10 deep breaths, practice coughing to be sure your lungs are clear. If you have an incision (the cut made at the time of surgery), support your incision when coughing by placing a pillow or rolled up towels firmly against it. Once you are able to get out of bed, walk around indoors and cough well. You may stop using the incentive spirometer when instructed by your caregiver.  RISKS AND COMPLICATIONS  Take your time so you do not get dizzy or light-headed.  If you are in pain, you may need to take or ask for pain medication before doing incentive spirometry. It is harder to take a deep breath if you are having pain. AFTER USE  Rest and breathe slowly and easily.  It can be helpful to keep track of a log of your progress. Your caregiver can provide you with a simple table to help with this. If you are using the spirometer at home, follow these instructions: SEEK MEDICAL CARE IF:   You are having difficultly using the spirometer.  You have trouble using the spirometer as often as instructed.  Your pain medication is not giving enough relief while using the spirometer.  You develop fever of 100.5 F (38.1 C) or higher. SEEK IMMEDIATE MEDICAL CARE IF:    You cough up bloody sputum that had not been present before.  You develop fever of 102 F (38.9 C) or greater.  You develop worsening pain at or near the incision site. MAKE SURE YOU:   Understand these instructions.  Will watch your condition.  Will get help right away if you are not doing well or get worse. Document Released: 09/26/2006 Document Revised: 08/08/2011 Document Reviewed: 11/27/2006 Exit[employer] Patient Information 2014 [employer], [place].   ________________________________________________________________________  WHAT IS A BLOOD TRANSFUSION? Blood Transfusion Information  A transfusion is the replacement of blood or some of its parts. Blood is made up of multiple cells which provide different functions.  Red blood cells carry oxygen and are used for blood loss replacement.  White blood cells fight against infection.  Platelets control bleeding.  Plasma helps clot blood.  Other blood products are available for specialized needs, such as hemophilia or other clotting disorders. BEFORE THE TRANSFUSION  Who gives blood for transfusions?   Healthy volunteers who are fully evaluated to make sure their blood is safe. This is blood bank blood. Transfusion therapy is the safest it has ever been in the practice of medicine. Before blood is taken from a donor, a complete history is taken to make sure that person has no history of diseases nor engages in risky social behavior (examples are intravenous drug use or sexual activity with multiple partners). The donor's travel history is screened to minimize risk of transmitting infections, such as malaria. The donated blood is tested for signs of infectious diseases, such as HIV and hepatitis. The blood is then tested to be sure it is compatible with you in order to minimize the chance of a transfusion reaction. If you or a relative donates blood, this is often done in anticipation of surgery and is not appropriate for emergency  situations. It takes many days to process the donated blood. RISKS AND COMPLICATIONS Although transfusion therapy is very safe and saves many lives, the main dangers of transfusion include:   Getting an infectious disease.  Developing a transfusion reaction. This is an allergic reaction to something in the blood you were given. Every precaution  is taken to prevent this. The decision to have a blood transfusion has been considered carefully by your caregiver before blood is given. Blood is not given unless the benefits outweigh the risks. AFTER THE TRANSFUSION  Right after receiving a blood transfusion, you will usually feel much better and more energetic. This is especially true if your red blood cells have gotten low (anemic). The transfusion raises the level of the red blood cells which carry oxygen, and this usually causes an energy increase.  The nurse administering the transfusion will monitor you carefully for complications. HOME CARE INSTRUCTIONS  No special instructions are needed after a transfusion. You may find your energy is better. Speak with your caregiver about any limitations on activity for underlying diseases you may have. SEEK MEDICAL CARE IF:   Your condition is not improving after your transfusion.  You develop redness or irritation at the intravenous (IV) site. SEEK IMMEDIATE MEDICAL CARE IF:  Any of the following symptoms occur over the next 12 hours:  Shaking chills.  You have a temperature by mouth above 102 F (38.9 C), not controlled by medicine.  Chest, back, or muscle pain.  People around you feel you are not acting correctly or are confused.  Shortness of breath or difficulty breathing.  Dizziness and fainting.  You get a rash or develop hives.  You have a decrease in urine output.  Your urine turns a dark color or changes to pink, red, or brown. Any of the following symptoms occur over the next 10 days:  You have a temperature by mouth above  102 F (38.9 C), not controlled by medicine.  Shortness of breath.  Weakness after normal activity.  The white part of the eye turns yellow (jaundice).  You have a decrease in the amount of urine or are urinating less often.  Your urine turns a dark color or changes to pink, red, or brown. Document Released: 05/13/2000 Document Revised: 08/08/2011 Document Reviewed: 12/31/2007 [facility] Patient Information 2014 [place], [place].  _______________________________________________________________________

## 2018-04-12 ENCOUNTER — Encounter (HOSPITAL_COMMUNITY): Payer: Self-pay

## 2018-04-12 ENCOUNTER — Encounter (HOSPITAL_COMMUNITY)
Admission: RE | Admit: 2018-04-12 | Discharge: 2018-04-12 | Disposition: A | Discharge status: Home / Self Care | Payer: Medicare Other | Source: Ambulatory Visit | Attending: Orthopedic Surgery | Admitting: Orthopedic Surgery

## 2018-04-12 ENCOUNTER — Other Ambulatory Visit: Payer: Self-pay

## 2018-04-12 DIAGNOSIS — Z01812 Encounter for preprocedural laboratory examination: Secondary | ICD-10-CM | POA: Insufficient documentation

## 2018-04-12 LAB — COMPREHENSIVE METABOLIC PANEL
ALT: 24 U/L (ref 0–44)
AST: 31 U/L (ref 15–41)
Albumin: 4 g/dL (ref 3.5–5.0)
Alkaline Phosphatase: 62 U/L (ref 38–126)
Anion gap: 9 (ref 5–15)
BUN: 10 mg/dL (ref 8–23)
CHLORIDE: 105 mmol/L (ref 98–111)
CO2: 26 mmol/L (ref 22–32)
CREATININE: 0.9 mg/dL (ref 0.61–1.24)
Calcium: 9 mg/dL (ref 8.9–10.3)
GFR calc Af Amer: >60 mL/min (ref >60)
GFR calc non Af Amer: >60 mL/min (ref >60)
Glucose, Bld: 102 mg/dL — ABNORMAL HIGH (ref 70–99)
Potassium: 4.4 mmol/L (ref 3.5–5.1)
Sodium: 140 mmol/L (ref 135–145)
Total Bilirubin: 0.9 mg/dL (ref 0.3–1.2)
Total Protein: 7.6 g/dL (ref 6.5–8.1)

## 2018-04-12 LAB — CBC
HEMATOCRIT: 40.7 % (ref 39.0–52.0)
HEMOGLOBIN: 13.2 g/dL (ref 13.0–17.0)
MCH: 29.1 pg (ref 26.0–34.0)
MCHC: 32.4 g/dL (ref 30.0–36.0)
MCV: 89.8 fL (ref 80.0–100.0)
Platelets: 191 10*3/uL (ref 150–400)
RBC: 4.53 MIL/uL (ref 4.22–5.81)
RDW: 14.3 % (ref 11.5–15.5)
WBC: 8.4 10*3/uL (ref 4.0–10.5)
nRBC: 0 % (ref 0.0–0.2)

## 2018-04-12 LAB — SURGICAL PCR SCREEN
MRSA, PCR: POSITIVE — AB
STAPHYLOCOCCUS AUREUS: POSITIVE — AB

## 2018-04-12 LAB — APTT: aPTT: 32 seconds (ref 24–36)

## 2018-04-12 LAB — PROTIME-INR
INR: 1.01
Prothrombin Time: 13.2 seconds (ref 11.4–15.2)

## 2018-04-17 MED ORDER — VANCOMYCIN HCL 10 G IV SOLR
1500.0000 mg | INTRAVENOUS | Status: AC
  Administered 2018-04-18: 1500 mg via INTRAVENOUS
  Filled 2018-04-17: qty 1500

## 2018-04-18 ENCOUNTER — Encounter (HOSPITAL_COMMUNITY): Payer: Self-pay

## 2018-04-18 ENCOUNTER — Inpatient Hospital Stay (HOSPITAL_COMMUNITY): Payer: Medicare Other | Admitting: Certified Registered"

## 2018-04-18 ENCOUNTER — Encounter (HOSPITAL_COMMUNITY)
Admission: RE | Disposition: A | Discharge status: Home / Self Care | Payer: Self-pay | Source: Home / Self Care | Attending: Orthopedic Surgery

## 2018-04-18 ENCOUNTER — Inpatient Hospital Stay (HOSPITAL_COMMUNITY)
Admission: RE | Admit: 2018-04-18 | Discharge: 2018-04-19 | DRG: 470 | Disposition: A | Discharge status: Home / Self Care | Payer: Medicare Other | Attending: Orthopedic Surgery | Admitting: Orthopedic Surgery

## 2018-04-18 ENCOUNTER — Inpatient Hospital Stay (HOSPITAL_COMMUNITY): Payer: Medicare Other

## 2018-04-18 ENCOUNTER — Other Ambulatory Visit: Payer: Self-pay

## 2018-04-18 DIAGNOSIS — Z6835 Body mass index (BMI) 35.0-35.9, adult: Secondary | ICD-10-CM | POA: Diagnosis not present

## 2018-04-18 DIAGNOSIS — Z9049 Acquired absence of other specified parts of digestive tract: Secondary | ICD-10-CM

## 2018-04-18 DIAGNOSIS — M169 Osteoarthritis of hip, unspecified: Secondary | ICD-10-CM | POA: Diagnosis present

## 2018-04-18 DIAGNOSIS — J309 Allergic rhinitis, unspecified: Secondary | ICD-10-CM | POA: Diagnosis present

## 2018-04-18 DIAGNOSIS — M1612 Unilateral primary osteoarthritis, left hip: Secondary | ICD-10-CM | POA: Diagnosis present

## 2018-04-18 DIAGNOSIS — Z79899 Other long term (current) drug therapy: Secondary | ICD-10-CM

## 2018-04-18 DIAGNOSIS — M25752 Osteophyte, left hip: Secondary | ICD-10-CM | POA: Diagnosis present

## 2018-04-18 DIAGNOSIS — Z419 Encounter for procedure for purposes other than remedying health state, unspecified: Secondary | ICD-10-CM

## 2018-04-18 DIAGNOSIS — Z79891 Long term (current) use of opiate analgesic: Secondary | ICD-10-CM

## 2018-04-18 DIAGNOSIS — G629 Polyneuropathy, unspecified: Secondary | ICD-10-CM | POA: Diagnosis present

## 2018-04-18 DIAGNOSIS — Z96653 Presence of artificial knee joint, bilateral: Secondary | ICD-10-CM | POA: Diagnosis present

## 2018-04-18 DIAGNOSIS — Z9089 Acquired absence of other organs: Secondary | ICD-10-CM | POA: Diagnosis not present

## 2018-04-18 DIAGNOSIS — Z96649 Presence of unspecified artificial hip joint: Secondary | ICD-10-CM

## 2018-04-18 DIAGNOSIS — Z7951 Long term (current) use of inhaled steroids: Secondary | ICD-10-CM | POA: Diagnosis not present

## 2018-04-18 DIAGNOSIS — Z9181 History of falling: Secondary | ICD-10-CM

## 2018-04-18 DIAGNOSIS — E669 Obesity, unspecified: Secondary | ICD-10-CM | POA: Diagnosis present

## 2018-04-18 DIAGNOSIS — Z7982 Long term (current) use of aspirin: Secondary | ICD-10-CM

## 2018-04-18 DIAGNOSIS — N400 Enlarged prostate without lower urinary tract symptoms: Secondary | ICD-10-CM | POA: Diagnosis present

## 2018-04-18 DIAGNOSIS — K219 Gastro-esophageal reflux disease without esophagitis: Secondary | ICD-10-CM | POA: Diagnosis present

## 2018-04-18 HISTORY — PX: TOTAL HIP ARTHROPLASTY: SHX124

## 2018-04-18 LAB — TYPE AND SCREEN
ABO/RH(D): A POS
Antibody Screen: NEGATIVE

## 2018-04-18 SURGERY — ARTHROPLASTY, HIP, TOTAL, ANTERIOR APPROACH
Anesthesia: Spinal | Site: Hip | Laterality: Left

## 2018-04-18 MED ORDER — METOCLOPRAMIDE HCL 5 MG/ML IJ SOLN: 5.0000 mg | Freq: Three times a day (TID) | INTRAMUSCULAR | Status: DC | PRN

## 2018-04-18 MED ORDER — PHENOL 1.4 % MT LIQD
1.0000 | OROMUCOSAL | Status: DC | PRN
  Filled 2018-04-18: qty 177

## 2018-04-18 MED ORDER — BISACODYL 10 MG RE SUPP: 10.0000 mg | Freq: Every day | RECTAL | Status: DC | PRN

## 2018-04-18 MED ORDER — DEXAMETHASONE SODIUM PHOSPHATE 10 MG/ML IJ SOLN: 8.0000 mg | Freq: Once | INTRAMUSCULAR | Status: AC

## 2018-04-18 MED ORDER — TRAMADOL HCL 50 MG PO TABS: 50.0000 mg | ORAL_TABLET | Freq: Four times a day (QID) | ORAL | Status: DC | PRN

## 2018-04-18 MED ORDER — DOXAZOSIN MESYLATE 8 MG PO TABS
8.0000 mg | ORAL_TABLET | Freq: Every day | ORAL | Status: DC
  Administered 2018-04-18: 8 mg via ORAL
  Filled 2018-04-18: qty 1

## 2018-04-18 MED ORDER — METHOCARBAMOL 500 MG IVPB - SIMPLE MED
INTRAVENOUS | Status: AC
  Filled 2018-04-18: qty 50

## 2018-04-18 MED ORDER — FLUTICASONE PROPIONATE 50 MCG/ACT NA SUSP
2.0000 | Freq: Every day | NASAL | Status: DC
  Administered 2018-04-19: 2 via NASAL
  Filled 2018-04-18: qty 16

## 2018-04-18 MED ORDER — ONDANSETRON HCL 4 MG PO TABS: 4.0000 mg | ORAL_TABLET | Freq: Four times a day (QID) | ORAL | Status: DC | PRN

## 2018-04-18 MED ORDER — ACETAMINOPHEN 10 MG/ML IV SOLN
1000.0000 mg | Freq: Four times a day (QID) | INTRAVENOUS | Status: DC
  Administered 2018-04-18: 1000 mg via INTRAVENOUS
  Filled 2018-04-18: qty 100

## 2018-04-18 MED ORDER — CEFAZOLIN SODIUM-DEXTROSE 2-4 GM/100ML-% IV SOLN: 2.0000 g | INTRAVENOUS | Status: DC

## 2018-04-18 MED ORDER — SODIUM CHLORIDE 0.9 % IV SOLN
INTRAVENOUS | Status: DC | PRN
  Administered 2018-04-18: 25 ug/min via INTRAVENOUS

## 2018-04-18 MED ORDER — DEXAMETHASONE SODIUM PHOSPHATE 10 MG/ML IJ SOLN
10.0000 mg | Freq: Once | INTRAMUSCULAR | Status: AC
  Administered 2018-04-19: 10 mg via INTRAVENOUS
  Filled 2018-04-18: qty 1

## 2018-04-18 MED ORDER — DOCUSATE SODIUM 100 MG PO CAPS
100.0000 mg | ORAL_CAPSULE | Freq: Two times a day (BID) | ORAL | Status: DC
  Administered 2018-04-18 – 2018-04-19 (×2): 100 mg via ORAL
  Filled 2018-04-18 (×2): qty 1

## 2018-04-18 MED ORDER — FENTANYL CITRATE (PF) 100 MCG/2ML IJ SOLN
INTRAMUSCULAR | Status: AC
  Filled 2018-04-18: qty 2

## 2018-04-18 MED ORDER — DIPHENHYDRAMINE HCL 12.5 MG/5ML PO ELIX: 12.5000 mg | ORAL_SOLUTION | ORAL | Status: DC | PRN

## 2018-04-18 MED ORDER — FENTANYL CITRATE (PF) 100 MCG/2ML IJ SOLN
INTRAMUSCULAR | Status: DC | PRN
  Administered 2018-04-18 (×2): 50 ug via INTRAVENOUS

## 2018-04-18 MED ORDER — TRANEXAMIC ACID-NACL 1000-0.7 MG/100ML-% IV SOLN
1000.0000 mg | INTRAVENOUS | Status: AC
  Administered 2018-04-18: 1000 mg via INTRAVENOUS
  Filled 2018-04-18: qty 100

## 2018-04-18 MED ORDER — ASPIRIN EC 325 MG PO TBEC
325.0000 mg | DELAYED_RELEASE_TABLET | Freq: Two times a day (BID) | ORAL | Status: DC
  Administered 2018-04-19: 325 mg via ORAL
  Filled 2018-04-18: qty 1

## 2018-04-18 MED ORDER — PROPOFOL 10 MG/ML IV BOLUS
INTRAVENOUS | Status: AC
  Filled 2018-04-18: qty 60

## 2018-04-18 MED ORDER — ONDANSETRON HCL 4 MG/2ML IJ SOLN
INTRAMUSCULAR | Status: DC | PRN
  Administered 2018-04-18: 4 mg via INTRAVENOUS

## 2018-04-18 MED ORDER — FLEET ENEMA 7-19 GM/118ML RE ENEM: 1.0000 | ENEMA | Freq: Once | RECTAL | Status: DC | PRN

## 2018-04-18 MED ORDER — DEXMEDETOMIDINE HCL 200 MCG/2ML IV SOLN
INTRAVENOUS | Status: DC | PRN
  Administered 2018-04-18 (×3): 8 ug via INTRAVENOUS

## 2018-04-18 MED ORDER — HYDROCODONE-ACETAMINOPHEN 7.5-325 MG PO TABS: 1.0000 | ORAL_TABLET | ORAL | Status: DC | PRN

## 2018-04-18 MED ORDER — STERILE WATER FOR IRRIGATION IR SOLN
Status: DC | PRN
  Administered 2018-04-18: 2000 mL

## 2018-04-18 MED ORDER — METHOCARBAMOL 500 MG PO TABS
500.0000 mg | ORAL_TABLET | Freq: Four times a day (QID) | ORAL | Status: DC | PRN
  Administered 2018-04-18: 500 mg via ORAL
  Filled 2018-04-18: qty 1

## 2018-04-18 MED ORDER — POLYETHYLENE GLYCOL 3350 17 G PO PACK: 17.0000 g | PACK | Freq: Every day | ORAL | Status: DC | PRN

## 2018-04-18 MED ORDER — METHOCARBAMOL 500 MG IVPB - SIMPLE MED
500.0000 mg | Freq: Four times a day (QID) | INTRAVENOUS | Status: DC | PRN
  Administered 2018-04-18: 500 mg via INTRAVENOUS
  Filled 2018-04-18: qty 50

## 2018-04-18 MED ORDER — PANTOPRAZOLE SODIUM 40 MG PO TBEC
80.0000 mg | DELAYED_RELEASE_TABLET | Freq: Every day | ORAL | Status: DC
  Administered 2018-04-18: 80 mg via ORAL
  Filled 2018-04-18: qty 2

## 2018-04-18 MED ORDER — CEFAZOLIN SODIUM-DEXTROSE 2-3 GM-%(50ML) IV SOLR
INTRAVENOUS | Status: DC | PRN
  Administered 2018-04-18: 2 g via INTRAVENOUS

## 2018-04-18 MED ORDER — TRANEXAMIC ACID-NACL 1000-0.7 MG/100ML-% IV SOLN
1000.0000 mg | Freq: Once | INTRAVENOUS | Status: AC
  Administered 2018-04-18: 1000 mg via INTRAVENOUS
  Filled 2018-04-18: qty 100

## 2018-04-18 MED ORDER — CEFAZOLIN SODIUM-DEXTROSE 2-4 GM/100ML-% IV SOLN
2.0000 g | Freq: Four times a day (QID) | INTRAVENOUS | Status: AC
  Administered 2018-04-18 – 2018-04-19 (×2): 2 g via INTRAVENOUS
  Filled 2018-04-18 (×2): qty 100

## 2018-04-18 MED ORDER — 0.9 % SODIUM CHLORIDE (POUR BTL) OPTIME
TOPICAL | Status: DC | PRN
  Administered 2018-04-18: 1000 mL

## 2018-04-18 MED ORDER — ONDANSETRON HCL 4 MG/2ML IJ SOLN: 4.0000 mg | Freq: Four times a day (QID) | INTRAMUSCULAR | Status: DC | PRN

## 2018-04-18 MED ORDER — BUPIVACAINE HCL (PF) 0.25 % IJ SOLN
INTRAMUSCULAR | Status: DC | PRN
  Administered 2018-04-18: 30 mL

## 2018-04-18 MED ORDER — PROPOFOL 10 MG/ML IV BOLUS
INTRAVENOUS | Status: AC
  Filled 2018-04-18: qty 20

## 2018-04-18 MED ORDER — CEFAZOLIN SODIUM-DEXTROSE 2-4 GM/100ML-% IV SOLN
INTRAVENOUS | Status: AC
  Filled 2018-04-18: qty 100

## 2018-04-18 MED ORDER — LORATADINE 10 MG PO TABS
10.0000 mg | ORAL_TABLET | Freq: Every day | ORAL | Status: DC
  Administered 2018-04-19: 10 mg via ORAL
  Filled 2018-04-18: qty 1

## 2018-04-18 MED ORDER — PROPOFOL 10 MG/ML IV BOLUS
INTRAVENOUS | Status: DC | PRN
  Administered 2018-04-18 (×2): 40 mg via INTRAVENOUS

## 2018-04-18 MED ORDER — PROPOFOL 500 MG/50ML IV EMUL
INTRAVENOUS | Status: DC | PRN
  Administered 2018-04-18: 75 ug/kg/min via INTRAVENOUS

## 2018-04-18 MED ORDER — ACETAMINOPHEN 500 MG PO TABS
500.0000 mg | ORAL_TABLET | Freq: Four times a day (QID) | ORAL | Status: DC
  Administered 2018-04-18 – 2018-04-19 (×2): 500 mg via ORAL
  Filled 2018-04-18 (×3): qty 1

## 2018-04-18 MED ORDER — MORPHINE SULFATE (PF) 4 MG/ML IV SOLN: 0.5000 mg | INTRAVENOUS | Status: DC | PRN

## 2018-04-18 MED ORDER — FENTANYL CITRATE (PF) 100 MCG/2ML IJ SOLN
25.0000 ug | INTRAMUSCULAR | Status: DC | PRN
  Administered 2018-04-18 (×2): 50 ug via INTRAVENOUS

## 2018-04-18 MED ORDER — MONTELUKAST SODIUM 10 MG PO TABS
10.0000 mg | ORAL_TABLET | Freq: Every day | ORAL | Status: DC
  Administered 2018-04-18: 10 mg via ORAL
  Filled 2018-04-18: qty 1

## 2018-04-18 MED ORDER — PHENYLEPHRINE 40 MCG/ML (10ML) SYRINGE FOR IV PUSH (FOR BLOOD PRESSURE SUPPORT)
PREFILLED_SYRINGE | INTRAVENOUS | Status: DC | PRN
  Administered 2018-04-18: 120 ug via INTRAVENOUS

## 2018-04-18 MED ORDER — LACTATED RINGERS IV SOLN
INTRAVENOUS | Status: DC
  Administered 2018-04-18 (×2): via INTRAVENOUS

## 2018-04-18 MED ORDER — METOCLOPRAMIDE HCL 5 MG PO TABS: 5.0000 mg | ORAL_TABLET | Freq: Three times a day (TID) | ORAL | Status: DC | PRN

## 2018-04-18 MED ORDER — MENTHOL 3 MG MT LOZG: 1.0000 | LOZENGE | OROMUCOSAL | Status: DC | PRN

## 2018-04-18 MED ORDER — HYDROCODONE-ACETAMINOPHEN 5-325 MG PO TABS
1.0000 | ORAL_TABLET | ORAL | Status: DC | PRN
  Administered 2018-04-18 – 2018-04-19 (×5): 2 via ORAL
  Filled 2018-04-18 (×5): qty 2

## 2018-04-18 MED ORDER — CHLORHEXIDINE GLUCONATE 4 % EX LIQD: 60.0000 mL | Freq: Once | CUTANEOUS | Status: DC

## 2018-04-18 MED ORDER — BUPIVACAINE IN DEXTROSE 0.75-8.25 % IT SOLN
INTRATHECAL | Status: DC | PRN
  Administered 2018-04-18: 1.8 mL via INTRATHECAL

## 2018-04-18 MED ORDER — BUPIVACAINE HCL (PF) 0.25 % IJ SOLN
INTRAMUSCULAR | Status: AC
  Filled 2018-04-18: qty 30

## 2018-04-18 MED ORDER — LIDOCAINE 2% (20 MG/ML) 5 ML SYRINGE
INTRAMUSCULAR | Status: DC | PRN
  Administered 2018-04-18: 40 mg via INTRAVENOUS

## 2018-04-18 MED ORDER — GABAPENTIN 300 MG PO CAPS
300.0000 mg | ORAL_CAPSULE | Freq: Two times a day (BID) | ORAL | Status: DC
  Administered 2018-04-18 – 2018-04-19 (×2): 300 mg via ORAL
  Filled 2018-04-18 (×2): qty 1

## 2018-04-18 MED ORDER — DEXAMETHASONE SODIUM PHOSPHATE 10 MG/ML IJ SOLN
INTRAMUSCULAR | Status: DC | PRN
  Administered 2018-04-18: 8 mg via INTRAVENOUS

## 2018-04-18 MED ORDER — SODIUM CHLORIDE 0.9 % IV SOLN
INTRAVENOUS | Status: DC
  Administered 2018-04-18 – 2018-04-19 (×2): via INTRAVENOUS

## 2018-04-18 SURGICAL SUPPLY — 49 items
ARTICULEZE HEAD (Hips) ×2 IMPLANT
BAG DECANTER FOR FLEXI CONT (MISCELLANEOUS) ×2 IMPLANT
BAG SPEC THK2 15X12 ZIP CLS (MISCELLANEOUS)
BAG ZIPLOCK 12X15 (MISCELLANEOUS) IMPLANT
BLADE SAG 18X100X1.27 (BLADE) ×2 IMPLANT
COVER PERINEAL POST (MISCELLANEOUS) ×2 IMPLANT
COVER SURGICAL LIGHT HANDLE (MISCELLANEOUS) ×2 IMPLANT
COVER WAND RF STERILE (DRAPES) IMPLANT
CUP ACET PINNACLE SECTR 56MM (Hips) IMPLANT
DECANTER SPIKE VIAL GLASS SM (MISCELLANEOUS) ×2 IMPLANT
DRAPE STERI IOBAN 125X83 (DRAPES) ×2 IMPLANT
DRAPE U-SHAPE 47X51 STRL (DRAPES) ×4 IMPLANT
DRSG ADAPTIC 3X8 NADH LF (GAUZE/BANDAGES/DRESSINGS) ×2 IMPLANT
DRSG MEPILEX BORDER 4X4 (GAUZE/BANDAGES/DRESSINGS) ×2 IMPLANT
DRSG MEPILEX BORDER 4X8 (GAUZE/BANDAGES/DRESSINGS) ×2 IMPLANT
DURAPREP 26ML APPLICATOR (WOUND CARE) ×2 IMPLANT
ELECT REM PT RETURN 15FT ADLT (MISCELLANEOUS) ×2 IMPLANT
EVACUATOR 1/8 PVC DRAIN (DRAIN) ×2 IMPLANT
GLOVE BIO SURGEON STRL SZ 6.5 (GLOVE) ×1 IMPLANT
GLOVE BIO SURGEON STRL SZ7 (GLOVE) ×2 IMPLANT
GLOVE BIO SURGEON STRL SZ8 (GLOVE) ×2 IMPLANT
GLOVE BIO SURGEON STRL SZ8.5 (GLOVE) ×1 IMPLANT
GLOVE BIOGEL PI IND STRL 6.5 (GLOVE) IMPLANT
GLOVE BIOGEL PI IND STRL 7.0 (GLOVE) ×1 IMPLANT
GLOVE BIOGEL PI IND STRL 7.5 (GLOVE) IMPLANT
GLOVE BIOGEL PI IND STRL 8 (GLOVE) ×1 IMPLANT
GLOVE BIOGEL PI INDICATOR 6.5 (GLOVE) ×1
GLOVE BIOGEL PI INDICATOR 7.0 (GLOVE) ×3
GLOVE BIOGEL PI INDICATOR 7.5 (GLOVE) ×2
GLOVE BIOGEL PI INDICATOR 8 (GLOVE) ×1
GOWN SPEC L3 XXLG W/TWL (GOWN DISPOSABLE) ×1 IMPLANT
GOWN STRL REUS W/TWL LRG LVL3 (GOWN DISPOSABLE) ×4 IMPLANT
GOWN STRL REUS W/TWL XL LVL3 (GOWN DISPOSABLE) ×2 IMPLANT
HEAD ARTICULEZE (Hips) IMPLANT
HOLDER FOLEY CATH W/STRAP (MISCELLANEOUS) ×2 IMPLANT
LINER MARATHON 4 NEUTRAL 36X56 (Hips) ×1 IMPLANT
MANIFOLD NEPTUNE II (INSTRUMENTS) ×2 IMPLANT
PACK ANTERIOR HIP CUSTOM (KITS) ×2 IMPLANT
PINNACLE SECTOR CUP 56MM (Hips) ×2 IMPLANT
STEM FEMORAL SZ6 HIGH ACTIS (Stem) ×1 IMPLANT
STRIP CLOSURE SKIN 1/2X4 (GAUZE/BANDAGES/DRESSINGS) ×2 IMPLANT
SUT ETHIBOND NAB CT1 #1 30IN (SUTURE) ×2 IMPLANT
SUT MNCRL AB 4-0 PS2 18 (SUTURE) ×2 IMPLANT
SUT STRATAFIX 0 PDS 27 VIOLET (SUTURE) ×2
SUT VIC AB 2-0 CT1 27 (SUTURE) ×4
SUT VIC AB 2-0 CT1 TAPERPNT 27 (SUTURE) ×2 IMPLANT
SUTURE STRATFX 0 PDS 27 VIOLET (SUTURE) ×1 IMPLANT
TRAY FOLEY MTR SLVR 16FR STAT (SET/KITS/TRAYS/PACK) ×2 IMPLANT
YANKAUER SUCT BULB TIP 10FT TU (MISCELLANEOUS) ×2 IMPLANT

## 2018-04-18 NOTE — Anesthesia Postprocedure Evaluation (Signed)
Anesthesia Post Note  Patient: Isaiah Wilson  Procedure(s) Performed: LEFT TOTAL HIP ARTHROPLASTY ANTERIOR APPROACH (Left Hip)     Patient location during evaluation: PACU Anesthesia Type: Spinal Level of consciousness: oriented and awake and alert Pain management: pain level controlled Vital Signs Assessment: post-procedure vital signs reviewed and stable Respiratory status: spontaneous breathing, respiratory function stable and patient connected to nasal cannula oxygen Cardiovascular status: blood pressure returned to baseline and stable Postop Assessment: no headache, no backache, no apparent nausea or vomiting, patient able to bend at knees and spinal receding Anesthetic complications: no    Last Vitals:  Vitals:   04/18/18 1640 04/18/18 1738  BP: (!) 171/90 (!) 187/101  Pulse: (!) 50 (!) 52  Resp: 17 16  Temp:  (!) 36.4 C  SpO2: 95% 100%    Last Pain:  Vitals:   04/18/18 1738  TempSrc: Oral  PainSc:                  Shelton SilvasKevin D Megann Easterwood

## 2018-04-18 NOTE — Anesthesia Preprocedure Evaluation (Addendum)
Anesthesia Evaluation  Patient identified by MRN, date of birth, ID band Patient awake    Reviewed: Allergy & Precautions, NPO status , Patient's Chart, lab work & pertinent test results  Airway Mallampati: III  TM Distance: <3 FB Neck ROM: Full    Dental  (+) Teeth Intact, Dental Advisory Given   Pulmonary neg pulmonary ROS,    breath sounds clear to auscultation       Cardiovascular hypertension,  Rhythm:Regular Rate:Normal     Neuro/Psych Anxiety    GI/Hepatic Neg liver ROS, GERD  ,  Endo/Other  negative endocrine ROS  Renal/GU negative Renal ROS     Musculoskeletal  (+) Arthritis , Osteoarthritis,    Abdominal (+) + obese,   Peds  Hematology negative hematology ROS (+)   Anesthesia Other Findings   Reproductive/Obstetrics                           Lab Results  Component Value Date   WBC 8.4 04/12/2018   HGB 13.2 04/12/2018   HCT 40.7 04/12/2018   MCV 89.8 04/12/2018   PLT 191 04/12/2018   Lab Results  Component Value Date   INR 1.01 04/12/2018   INR 1.01 12/01/2016   INR 1.05 11/03/2015   EKG: sinus bradycardia.   Anesthesia Physical Anesthesia Plan  ASA: III  Anesthesia Plan: Spinal   Post-op Pain Management:    Induction: Intravenous  PONV Risk Score and Plan: Propofol infusion and Ondansetron  Airway Management Planned: Natural Airway and Simple Face Mask  Additional Equipment: None  Intra-op Plan:   Post-operative Plan:   Informed Consent: I have reviewed the patients History and Physical, chart, labs and discussed the procedure including the risks, benefits and alternatives for the proposed anesthesia with the patient or authorized representative who has indicated his/her understanding and acceptance.   Dental advisory given  Plan Discussed with: CRNA  Anesthesia Plan Comments:        Anesthesia Quick Evaluation

## 2018-04-18 NOTE — Interval H&P Note (Signed)
History and Physical Interval Note:  04/18/2018 11:29 AM  Isaiah RuddJames L Schulenburg  has presented today for surgery, with the diagnosis of left hip osteoarthritis  The various methods of treatment have been discussed with the patient and family. After consideration of risks, benefits and other options for treatment, the patient has consented to  Procedure(s) with comments: LEFT TOTAL HIP ARTHROPLASTY ANTERIOR APPROACH (Left) - 100min as a surgical intervention .  The patient's history has been reviewed, patient examined, no change in status, stable for surgery.  I have reviewed the patient's chart and labs.  Questions were answered to the patient's satisfaction.     Homero FellersFrank Demesha Boorman

## 2018-04-18 NOTE — Transfer of Care (Signed)
Immediate Anesthesia Transfer of Care Note  Patient: Isaiah Wilson  Procedure(s) Performed: LEFT TOTAL HIP ARTHROPLASTY ANTERIOR APPROACH (Left Hip)  Patient Location: PACU  Anesthesia Type:Spinal  Level of Consciousness: awake and alert   Airway & Oxygen Therapy: Patient Spontanous Breathing and Patient connected to face mask oxygen  Post-op Assessment: Report given to RN and Post -op Vital signs reviewed and stable  Post vital signs: Reviewed and stable  Last Vitals:  Vitals Value Taken Time  BP 151/79 04/18/2018  2:38 PM  Temp    Pulse 45 04/18/2018  2:41 PM  Resp 16 04/18/2018  2:41 PM  SpO2 100 % 04/18/2018  2:41 PM  Vitals shown include unvalidated device data.  Last Pain:  Vitals:   04/18/18 1117  TempSrc:   PainSc: 0-No pain         Complications: No apparent anesthesia complications

## 2018-04-18 NOTE — Anesthesia Procedure Notes (Signed)
Date/Time: 04/18/2018 12:57 PM Performed by: Minerva EndsMirarchi, Kurt Hoffmeier M, CRNA Airway Equipment and Method: Oral airway

## 2018-04-18 NOTE — Anesthesia Procedure Notes (Signed)
Spinal  Start time: 04/18/2018 12:45 PM End time: 04/18/2018 12:47 PM Staffing Anesthesiologist: Shelton SilvasHollis, Kimberlee Shoun D, MD Performed: anesthesiologist  Preanesthetic Checklist Completed: patient identified, site marked, surgical consent, pre-op evaluation, timeout performed, IV checked, risks and benefits discussed and monitors and equipment checked Spinal Block Patient position: sitting Prep: site prepped and draped and DuraPrep Location: L3-4 Injection technique: single-shot Needle Needle type: Pencan  Needle gauge: 24 G Needle length: 10 cm Needle insertion depth: 10 cm Additional Notes Patient tolerated well. No immediate complications.

## 2018-04-18 NOTE — Op Note (Signed)
OPERATIVE REPORT- TOTAL HIP ARTHROPLASTY   PREOPERATIVE DIAGNOSIS: Osteoarthritis of the Left hip.   POSTOPERATIVE DIAGNOSIS: Osteoarthritis of the Left  hip.   PROCEDURE: Left total hip arthroplasty, anterior approach.   SURGEON: Ollen Gross, MD   ASSISTANT: Saul Fordyce, PA-C  ANESTHESIA:  Spinal  ESTIMATED BLOOD LOSS:-300 mL    DRAINS: Hemovac x1.   COMPLICATIONS: None   CONDITION: PACU - hemodynamically stable.   BRIEF CLINICAL NOTE: Isaiah Wilson is a 83 y.o. male who has advanced end-  stage arthritis of their Left  hip with progressively worsening pain and  dysfunction.The patient has failed nonoperative management and presents for  total hip arthroplasty.   PROCEDURE IN DETAIL: After successful administration of spinal  anesthetic, the traction boots for the [facility] bed were placed on both  feet and the patient was placed onto the [facility] bed, boots placed into the leg  holders. The Left hip was then isolated from the perineum with plastic  drapes and prepped and draped in the usual sterile fashion. ASIS and  greater trochanter were marked and a oblique incision was made, starting  at about 1 cm lateral and 2 cm distal to the ASIS and coursing towards  the anterior cortex of the femur. The skin was cut with a 10 blade  through subcutaneous tissue to the level of the fascia overlying the  tensor fascia lata muscle. The fascia was then incised in line with the  incision at the junction of the anterior third and posterior 2/3rd. The  muscle was teased off the fascia and then the interval between the TFL  and the rectus was developed. The Hohmann retractor was then placed at  the top of the femoral neck over the capsule. The vessels overlying the  capsule were cauterized and the fat on top of the capsule was removed.  A Hohmann retractor was then placed anterior underneath the rectus  femoris to give exposure to the entire anterior capsule. A T-shaped  capsulotomy  was performed. The edges were tagged and the femoral head  was identified.       Osteophytes are removed off the superior acetabulum.  The femoral neck was then cut in situ with an oscillating saw. Traction  was then applied to the left lower extremity utilizing the [facility]  traction. The femoral head was then removed. Retractors were placed  around the acetabulum and then circumferential removal of the labrum was  performed. Osteophytes were also removed. Reaming starts at 49 mm to  medialize and  Increased in 2 mm increments to 55 mm. We reamed in  approximately 40 degrees of abduction, 20 degrees anteversion. A 56 mm  pinnacle acetabular shell was then impacted in anatomic position under  fluoroscopic guidance with excellent purchase. We did not need to place  any additional dome screws. A 36 mm neutral + 4 marathon liner was then  placed into the acetabular shell.       The femoral lift was then placed along the lateral aspect of the femur  just distal to the vastus ridge. The leg was  externally rotated and capsule  was stripped off the inferior aspect of the femoral neck down to the  level of the lesser trochanter, this was done with electrocautery. The femur was lifted after this was performed. The  leg was then placed in an extended and adducted position essentially delivering the femur. We also removed the capsule superiorly and the piriformis from the piriformis fossa to gain excellent  exposure of the  proximal femur. Rongeur was used to remove some cancellous bone to get  into the lateral portion of the proximal femur for placement of the  initial starter reamer. The starter broaches was placed  the starter broach  and was shown to go down the center of the canal. Broaching  with the Corail system was then performed starting at size 8  coursing  Up to size 6. A size 6 had excellent torsional and rotational  and axial stability. The trial high offset neck was then placed  with a 36 +  5 trial head. The hip was then reduced. We confirmed that  the stem was in the canal both on AP and lateral x-rays. It also has excellent sizing. The hip was reduced with outstanding stability through full extension and full external rotation.. AP pelvis was taken and the leg lengths were measured and found to be equal. Hip was then dislocated again and the femoral head and neck removed. The  femoral broach was removed. Size 6 Corail stem with a high offset  neck was then impacted into the femur following native anteversion. Has  excellent purchase in the canal. Excellent torsional and rotational and  axial stability. It is confirmed to be in the canal on AP and lateral  fluoroscopic views. The 36 + 5 metal head was placed and the hip  reduced with outstanding stability. Again AP pelvis was taken and it  confirmed that the leg lengths were equal. The wound was then copiously  irrigated with saline solution and the capsule reattached and repaired  with Ethibond suture. 30 ml of .25% Bupivicaine was  injected into the capsule and into the edge of the tensor fascia lata as well as subcutaneous tissue. The fascia overlying the tensor fascia lata was then closed with a running #1 V-Loc. Subcu was closed with interrupted 2-0 Vicryl and subcuticular running 4-0 Monocryl. Incision was cleaned  and dried. Steri-Strips and a bulky sterile dressing applied. Hemovac  drain was hooked to suction and then the patient was awakened and transported to  recovery in stable condition.        Please note that a surgical assistant was a medical necessity for this procedure to perform it in a safe and expeditious manner. Assistant was necessary to provide appropriate retraction of vital neurovascular structures and to prevent femoral fracture and allow for anatomic placement of the prosthesis.  Ollen GrossFrank Jahmez Bily, M.D.

## 2018-04-18 NOTE — Anesthesia Procedure Notes (Signed)
Procedure Name: MAC Date/Time: 04/18/2018 12:40 PM Performed by: Cynda Familia, CRNA Pre-anesthesia Checklist: Emergency Drugs available, Suction available, Patient being monitored, Timeout performed and Patient identified Patient Re-evaluated:Patient Re-evaluated prior to induction Placement Confirmation: positive ETCO2 and breath sounds checked- equal and bilateral Dental Injury: Teeth and Oropharynx as per pre-operative assessment  Comments: Sedation for spinal

## 2018-04-19 ENCOUNTER — Encounter (HOSPITAL_COMMUNITY): Payer: Self-pay | Admitting: Orthopedic Surgery

## 2018-04-19 LAB — BASIC METABOLIC PANEL
Anion gap: 9 (ref 5–15)
BUN: 19 mg/dL (ref 8–23)
CALCIUM: 8.3 mg/dL — AB (ref 8.9–10.3)
CO2: 23 mmol/L (ref 22–32)
Chloride: 106 mmol/L (ref 98–111)
Creatinine, Ser: 0.81 mg/dL (ref 0.61–1.24)
GFR calc Af Amer: >60 mL/min (ref >60)
GLUCOSE: 131 mg/dL — AB (ref 70–99)
Potassium: 3.6 mmol/L (ref 3.5–5.1)
Sodium: 138 mmol/L (ref 135–145)

## 2018-04-19 LAB — CBC
HCT: 35.1 % — ABNORMAL LOW (ref 39.0–52.0)
HEMOGLOBIN: 11.5 g/dL — AB (ref 13.0–17.0)
MCH: 28.8 pg (ref 26.0–34.0)
MCHC: 32.8 g/dL (ref 30.0–36.0)
MCV: 87.8 fL (ref 80.0–100.0)
NRBC: 0 % (ref 0.0–0.2)
Platelets: 185 10*3/uL (ref 150–400)
RBC: 4 MIL/uL — AB (ref 4.22–5.81)
RDW: 14.4 % (ref 11.5–15.5)
WBC: 13.9 10*3/uL — ABNORMAL HIGH (ref 4.0–10.5)

## 2018-04-19 MED ORDER — HYDROCODONE-ACETAMINOPHEN 5-325 MG PO TABS: 1.0000 | ORAL_TABLET | Freq: Four times a day (QID) | ORAL | 0 refills | Status: DC | PRN

## 2018-04-19 MED ORDER — ASPIRIN 325 MG PO TBEC: 325.0000 mg | DELAYED_RELEASE_TABLET | Freq: Two times a day (BID) | ORAL | 0 refills | Status: AC

## 2018-04-19 MED ORDER — METHOCARBAMOL 500 MG PO TABS: 500.0000 mg | ORAL_TABLET | Freq: Four times a day (QID) | ORAL | 0 refills | Status: DC | PRN

## 2018-04-19 MED ORDER — TRAMADOL HCL 50 MG PO TABS: 50.0000 mg | ORAL_TABLET | Freq: Four times a day (QID) | ORAL | 0 refills | Status: DC | PRN

## 2018-04-19 NOTE — Progress Notes (Signed)
   Subjective: 1 Day Post-Op Procedure(s) (LRB): LEFT TOTAL HIP ARTHROPLASTY ANTERIOR APPROACH (Left) Patient reports pain as mild.   Patient seen in rounds by Dr. Lequita HaltAluisio. Patient is well, and has had no acute complaints or problems other than discomfort in the left hip. Denies chest pain or SOB. Foley catheter removed this AM. No issues overnight. We will start therapy today.   Objective: Vital signs in last 24 hours: Temp:  [97.4 F (36.3 C)-98.4 F (36.9 C)] 97.6 F (36.4 C) (11/21 0526) Pulse Rate:  [44-63] 59 (11/21 0526) Resp:  [14-18] 16 (11/21 0526) BP: (128-192)/(69-106) 128/75 (11/21 0526) SpO2:  [94 %-100 %] 94 % (11/21 0526) Weight:  [112.5 kg] 112.5 kg (11/20 1117)  Intake/Output from previous day:  Intake/Output Summary (Last 24 hours) at 04/19/2018 0707 Last data filed at 04/19/2018 0600 Gross per 24 hour  Intake 4847.92 ml  Output 2160 ml  Net 2687.92 ml     Labs: Recent Labs    04/19/18 0520  HGB 11.5*   Recent Labs    04/19/18 0520  WBC 13.9*  RBC 4.00*  HCT 35.1*  PLT 185   Recent Labs    04/19/18 0520  NA 138  K 3.6  CL 106  CO2 23  BUN 19  CREATININE 0.81  GLUCOSE 131*  CALCIUM 8.3*   Exam: General - Patient is Alert and Oriented Extremity - Neurologically intact Neurovascular intact Sensation intact distally Dorsiflexion/Plantar flexion intact Dressing - dressing C/D/I Motor Function - intact, moving foot and toes well on exam.   Past Medical History:  Diagnosis Date  . Abnormal EKG    hx left anterior fasicular block on old ekg 2017  . Allergic rhinitis   . Allergy   . Barrett's esophagus   . Benign prostatic hypertrophy   . Colitis, ischemic (HCC)    X 2  . Complication of anesthesia    SLOW TO AWAKEN AFTER 1 SURGERY 20  YRS AGO  . DJD (degenerative joint disease)    SPINE, AND OA  . GERD (gastroesophageal reflux disease)   . HOH (hard of hearing)    BOTH EARS  . HOH (hard of hearing)    BOTH EARS  .  Hyperlipidemia   . Hypertension    pt denies says cardura for bladder  . Neuropathy     LEFT HAND AND ARM NUMB ALL THE TIME  . Overweight(278.02)     Assessment/Plan: 1 Day Post-Op Procedure(s) (LRB): LEFT TOTAL HIP ARTHROPLASTY ANTERIOR APPROACH (Left) Principal Problem:   OA (osteoarthritis) of hip  Estimated body mass index is 35.58 kg/m as calculated from the following:   Height as of this encounter: 5\' 10"  (1.778 m).   Weight as of this encounter: 112.5 kg. Advance diet Up with therapy D/C IV fluids  DVT Prophylaxis - Aspirin Weight bearing as tolerated. D/C O2 and pulse ox and try on room air. Hemovac pulled without difficulty, will begin therapy.  Plan is to go Home after hospital stay. Plan for discharge this afternoon with HEP if progresses with therapy and meeting his goals. Follow-up in the office in 2 weeks with Dr. Lequita HaltAluisio.  Arther AbbottKristie Gabryel Files, PA-C Orthopedic Surgery 04/19/2018, 7:07 AM

## 2018-04-19 NOTE — Care Management Note (Signed)
Case Management Note  Patient Details  Name: Kyung RuddJames L Doucette MRN: [id number] Date of Birth: 04/06/1935  Subjective/Objective:    Spoke with patient at bedside. Confirmed plan for HEP.        Action/Plan: Needs a RW and 3n1, contacted [facility] to deliver to the room. [phone]       Expected Discharge Date:  04/19/18               Expected Discharge Plan:  Home/Self Care  In-House Referral:  NA  Discharge planning Services  CM Consult  Post Acute Care Choice:  Durable Medical Equipment Choice offered to:  Patient  DME Arranged:  3-N-1, Walker rolling DME Agency:  [facility]  HH Arranged:  NA HH Agency:  NA  Status of Service:  Completed, signed off  If discussed at Long Length of Stay Meetings, dates discussed:    Additional Comments:  Alexis Goodelleele, Khaleah Duer K, RN 04/19/2018, 10:55 AM

## 2018-04-19 NOTE — Progress Notes (Addendum)
   04/19/18 1500  PT Visit Information  Last PT Received On 04/19/18--pt progressing well; ready for d/c from PT standpoint; wife present for pm session  Assistance Needed +1  History of Present Illness s/p L THA; Hx: L TKA surgeries  Subjective Data  Subjective reports feeling well  Patient Stated Goal home today  Precautions  Precautions Fall  Restrictions  Weight Bearing Restrictions No  Pain Assessment  Pain Assessment 0-10  Pain Score 3  Pain Location L hip and back  Pain Descriptors / Indicators Discomfort;Nagging  Pain Intervention(s) Limited activity within patient's tolerance;Monitored during session;Repositioned  Cognition  Arousal/Alertness Awake/alert  Behavior During Therapy WFL for tasks assessed/performed  Overall Cognitive Status Within Functional Limits for tasks assessed  Bed Mobility  Overal bed mobility Needs Assistance  Bed Mobility Supine to Sit;Sit to Supine  Supine to sit Supervision  Sit to supine Supervision  General bed mobility comments using gait belt as leg lifter  Transfers  Overall transfer level Needs assistance  Equipment used Rolling walker (2 wheeled)  Transfers Sit to/from Stand  Sit to Stand Supervision  General transfer comment cues for hand placement  Ambulation/Gait  Ambulation/Gait assistance Supervision;Min guard  Gait Distance (Feet) 230 Feet  Assistive device Rolling walker (2 wheeled)  Gait Pattern/deviations Step-to pattern;Decreased stance time - left  General Gait Details cues for sequence   Stairs Yes  Stairs assistance Min guard  Stair Management No rails;Step to pattern;Forwards;With walker  Number of Stairs 1 (x2)  General stair comments cues for technique and sequence, min/guard for safety; wife present and able to provide min/guard  Total Joint Exercises  Ankle Circles/Pumps AROM;Both;10 reps  Quad Sets AROM;Both;10 reps  Heel Slides AROM;AAROM;Left;10 reps;Supine  Short Arc Quad AROM;Left;10 reps  PT - End of  Session  Equipment Utilized During Treatment Gait belt  Activity Tolerance Patient tolerated treatment well  Patient left in bed;with call bell/phone within reach;with family/visitor present   PT - Assessment/Plan  PT Plan Current plan remains appropriate  PT Visit Diagnosis Difficulty in walking, not elsewhere classified (R26.2)  PT Frequency (ACUTE ONLY) 7X/week  Follow Up Recommendations Follow surgeon's recommendation for DC plan and follow-up therapies  PT equipment None recommended by PT  AM-PAC PT "6 Clicks" Daily Activity Outcome Measure  Difficulty turning over in bed (including adjusting bedclothes, sheets and blankets)? 2  Difficulty moving from lying on back to sitting on the side of the bed?  2  Difficulty sitting down on and standing up from a chair with arms (e.g., wheelchair, bedside commode, etc,.)? 3  Help needed moving to and from a bed to chair (including a wheelchair)? 3  Help needed walking in hospital room? 3  Help needed climbing 3-5 steps with a railing?  3  6 Click Score 16  Mobility G Code  CK  PT Goal Progression  Progress towards PT goals Progressing toward goals  Acute Rehab PT Goals  PT Goal Formulation With patient  Time For Goal Achievement 04/26/18  Potential to Achieve Goals Good  PT Time Calculation  PT Start Time (ACUTE ONLY) 1440  PT Stop Time (ACUTE ONLY) 1503  PT Time Calculation (min) (ACUTE ONLY) 23 min  PT General Charges  $$ ACUTE PT VISIT 1 Visit  PT Treatments  $Gait Training 8-22 mins  $Therapeutic Exercise 8-22 mins

## 2018-04-19 NOTE — Evaluation (Signed)
Physical Therapy Evaluation Patient Details Name: Isaiah RuddJames L Willbanks MRN: [id number] DOB: 10/17/1934 Today's Date: 04/19/2018   History of Present Illness  s/p L THA; Hx: L TKA surgeries  Clinical Impression  Pt is s/p THA resulting in the deficits listed below (see PT Problem List).  Pt will benefit from skilled PT to increase their independence and safety with mobility to allow discharge to the venue listed below.       Follow Up Recommendations Follow surgeon's recommendation for DC plan and follow-up therapies    Equipment Recommendations  None recommended by PT    Recommendations for Other Services       Precautions / Restrictions Precautions Precautions: Fall Restrictions Weight Bearing Restrictions: No      Mobility  Bed Mobility Overal bed mobility: Needs Assistance Bed Mobility: Supine to Sit     Supine to sit: Min assist     General bed mobility comments: assist with LLE  Transfers Overall transfer level: Needs assistance Equipment used: Rolling walker (2 wheeled) Transfers: Sit to/from Stand Sit to Stand: Min guard         General transfer comment: cues for hand placement  Ambulation/Gait Ambulation/Gait assistance: Min guard Gait Distance (Feet): 50 Feet Assistive device: Rolling walker (2 wheeled) Gait Pattern/deviations: Step-to pattern;Decreased stance time - left     General Gait Details: cues for sequence   Stairs            Wheelchair Mobility    Modified Rankin (Stroke Patients Only)       Balance Overall balance assessment: Needs assistance   Sitting balance-Leahy Scale: Good     Standing balance support: No upper extremity supported Standing balance-Leahy Scale: Fair                               Pertinent Vitals/Pain Pain Assessment: 0-10 Pain Score: 7  Pain Location: L hip and back Pain Descriptors / Indicators: Discomfort;Nagging Pain Intervention(s): Limited activity within patient's  tolerance;Monitored during session;Premedicated before session;Repositioned    Home Living Family/patient expects to be discharged to:: Private residence Living Arrangements: Spouse/significant other Available Help at Discharge: Family Type of Home: House Home Access: Stairs to enter   Secretary/administratorntrance Stairs-Number of Steps: 1 and 1  Home Layout: One level Home Equipment: Environmental consultantWalker - 2 wheels;Shower seat;Adaptive equipment;Bedside commode;Cane - single point      Prior Function                 Hand Dominance        Extremity/Trunk Assessment   Upper Extremity Assessment Upper Extremity Assessment: Overall WFL for tasks assessed    Lower Extremity Assessment Lower Extremity Assessment: LLE deficits/detail LLE Deficits / Details: hip grossly 2+/5, knee 3/5, ankle WFL, limited by post op pain in hip and knee       Communication   Communication: No difficulties  Cognition Arousal/Alertness: Awake/alert Behavior During Therapy: WFL for tasks assessed/performed Overall Cognitive Status: Within Functional Limits for tasks assessed                                        General Comments      Exercises Total Joint Exercises Ankle Circles/Pumps: AROM;Both;10 reps Quad Sets: AROM;Both;10 reps Heel Slides: AROM;Left;5 reps   Assessment/Plan    PT Assessment Patient needs continued PT services  PT Problem List Decreased  strength;Decreased range of motion;Decreased activity tolerance;Decreased mobility;Pain;Decreased knowledge of use of DME       PT Treatment Interventions DME instruction;Gait training;Functional mobility training;Stair training;Therapeutic exercise;Patient/family education;Therapeutic activities    PT Goals (Current goals can be found in the Care Plan section)  Acute Rehab PT Goals PT Goal Formulation: With patient Time For Goal Achievement: 04/26/18 Potential to Achieve Goals: Good    Frequency 7X/week   Barriers to discharge         Co-evaluation               AM-PAC PT "6 Clicks" Daily Activity  Outcome Measure Difficulty turning over in bed (including adjusting bedclothes, sheets and blankets)?: Unable Difficulty moving from lying on back to sitting on the side of the bed? : Unable Difficulty sitting down on and standing up from a chair with arms (e.g., wheelchair, bedside commode, etc,.)?: Unable Help needed moving to and from a bed to chair (including a wheelchair)?: A Little Help needed walking in hospital room?: A Little Help needed climbing 3-5 steps with a railing? : A Little 6 Click Score: 12    End of Session Equipment Utilized During Treatment: Gait belt Activity Tolerance: Patient tolerated treatment well Patient left: in chair;with call bell/phone within reach;with chair alarm set   PT Visit Diagnosis: Difficulty in walking, not elsewhere classified (R26.2)    Time: 0940-1005 PT Time Calculation (min) (ACUTE ONLY): 25 min   Charges:   PT Evaluation $PT Eval Low Complexity: 1 Low PT Treatments $Gait Training: 8-22 mins       Drucilla Chalet, PT  Pager: [phone] Acute Rehab Dept [facility]): [phone]   04/19/2018    [facility] 04/19/2018, 11:04 AM

## 2018-04-19 NOTE — Discharge Instructions (Signed)
°Dr. Frank Aluisio °Total Joint Specialist °[facility] °[address] °[place], [place] [address] °[phone] ° °ANTERIOR APPROACH TOTAL HIP REPLACEMENT POSTOPERATIVE DIRECTIONS ° ° °Hip Rehabilitation, Guidelines Following Surgery  °The results of a hip operation are greatly improved after range of motion and muscle strengthening exercises. Follow all safety measures which are given to protect your hip. If any of these exercises cause increased pain or swelling in your joint, decrease the amount until you are comfortable again. Then slowly increase the exercises. Call your caregiver if you have problems or questions.  ° °HOME CARE INSTRUCTIONS  °• Remove items at home which could result in a fall. This includes throw rugs or furniture in walking pathways.  °· ICE to the affected hip every three hours for 30 minutes at a time and then as needed for pain and swelling.  Continue to use ice on the hip for pain and swelling from surgery. You may notice swelling that will progress down to the foot and ankle.  This is normal after surgery.  Elevate the leg when you are not up walking on it.   °· Continue to use the breathing machine which will help keep your temperature down.  It is common for your temperature to cycle up and down following surgery, especially at night when you are not up moving around and exerting yourself.  The breathing machine keeps your lungs expanded and your temperature down. ° °DIET °You may resume your previous home diet once your are discharged from the hospital. ° °DRESSING / WOUND CARE / SHOWERING °You may shower 3 days after surgery, but keep the wounds dry during showering.  You may use an occlusive plastic wrap (Press'n Seal for example), NO SOAKING/SUBMERGING IN THE BATHTUB.  If the bandage gets wet, change with a clean dry gauze.  If the incision gets wet, pat the wound dry with a clean towel. °You may start showering once you are discharged home but do not submerge the  incision under water. Just pat the incision dry and apply a dry gauze dressing on daily. °Change the surgical dressing daily and reapply a dry dressing each time. ° °ACTIVITY °Walk with your walker as instructed. °Use walker as long as suggested by your caregivers. °Avoid periods of inactivity such as sitting longer than an hour when not asleep. This helps prevent blood clots.  °You may resume a sexual relationship in one month or when given the OK by your doctor.  °You may return to work once you are cleared by your doctor.  °Do not drive a car for 6 weeks or until released by you surgeon.  °Do not drive while taking narcotics. ° °WEIGHT BEARING °Weight bearing as tolerated with assist device (walker, cane, etc) as directed, use it as long as suggested by your surgeon or therapist, typically at least 4-6 weeks. ° °POSTOPERATIVE CONSTIPATION PROTOCOL °Constipation - defined medically as fewer than three stools per week and severe constipation as less than one stool per week. ° °One of the most common issues patients have following surgery is constipation.  Even if you have a regular bowel pattern at home, your normal regimen is likely to be disrupted due to multiple reasons following surgery.  Combination of anesthesia, postoperative narcotics, change in appetite and fluid intake all can affect your bowels.  In order to avoid complications following surgery, here are some recommendations in order to help you during your recovery period. ° °Colace (docusate) - Pick up an over-the-counter form   of Colace or another stool softener and take twice a day as long as you are requiring postoperative pain medications.  Take with a full glass of water daily.  If you experience loose stools or diarrhea, hold the colace until you stool forms back up.  If your symptoms do not get better within 1 week or if they get worse, check with your doctor. ° °Dulcolax (bisacodyl) - Pick up over-the-counter and take as directed by the product  packaging as needed to assist with the movement of your bowels.  Take with a full glass of water.  Use this product as needed if not relieved by Colace only.  ° °MiraLax (polyethylene glycol) - Pick up over-the-counter to have on hand.  MiraLax is a solution that will increase the amount of water in your bowels to assist with bowel movements.  Take as directed and can mix with a glass of water, juice, soda, coffee, or tea.  Take if you go more than two days without a movement. °Do not use MiraLax more than once per day. Call your doctor if you are still constipated or irregular after using this medication for 7 days in a row. ° °If you continue to have problems with postoperative constipation, please contact the office for further assistance and recommendations.  If you experience "the worst abdominal pain ever" or develop nausea or vomiting, please contact the office immediatly for further recommendations for treatment. ° °ITCHING ° If you experience itching with your medications, try taking only a single pain pill, or even half a pain pill at a time.  You can also use Benadryl over the counter for itching or also to help with sleep.  ° °TED HOSE STOCKINGS °Wear the elastic stockings on both legs for three weeks following surgery during the day but you may remove then at night for sleeping. ° °MEDICATIONS °See your medication summary on the “After Visit Summary” that the nursing staff will review with you prior to discharge.  You may have some home medications which will be placed on hold until you complete the course of blood thinner medication.  It is important for you to complete the blood thinner medication as prescribed by your surgeon.  Continue your approved medications as instructed at time of discharge. ° °PRECAUTIONS °If you experience chest pain or shortness of breath - call 911 immediately for transfer to the hospital emergency department.  °If you develop a fever greater that 101 F, purulent drainage  from wound, increased redness or drainage from wound, foul odor from the wound/dressing, or calf pain - CONTACT YOUR SURGEON.   °                                                °FOLLOW-UP APPOINTMENTS °Make sure you keep all of your appointments after your operation with your surgeon and caregivers. You should call the office at the above phone number and make an appointment for approximately two weeks after the date of your surgery or on the date instructed by your surgeon outlined in the "After Visit Summary". ° °RANGE OF MOTION AND STRENGTHENING EXERCISES  °These exercises are designed to help you keep full movement of your hip joint. Follow your caregiver's or physical therapist's instructions. Perform all exercises about fifteen times, three times per day or as directed. Exercise both hips, even if you have   had only one joint replacement. These exercises can be done on a training (exercise) mat, on the floor, on a table or on a bed. Use whatever works the best and is most comfortable for you. Use music or television while you are exercising so that the exercises are a pleasant break in your day. This will make your life better with the exercises acting as a break in routine you can look forward to.  °• Lying on your back, slowly slide your foot toward your buttocks, raising your knee up off the floor. Then slowly slide your foot back down until your leg is straight again.  °• Lying on your back spread your legs as far apart as you can without causing discomfort.  °• Lying on your side, raise your upper leg and foot straight up from the floor as far as is comfortable. Slowly lower the leg and repeat.  °• Lying on your back, tighten up the muscle in the front of your thigh (quadriceps muscles). You can do this by keeping your leg straight and trying to raise your heel off the floor. This helps strengthen the largest muscle supporting your knee.  °• Lying on your back, tighten up the muscles of your buttocks both  with the legs straight and with the knee bent at a comfortable angle while keeping your heel on the floor.  ° °IF YOU ARE TRANSFERRED TO A SKILLED REHAB FACILITY °If the patient is transferred to a skilled rehab facility following release from the hospital, a list of the current medications will be sent to the facility for the patient to continue.  When discharged from the skilled rehab facility, please have the facility set up the patient's Home Health Physical Therapy prior to being released. Also, the skilled facility will be responsible for providing the patient with their medications at time of release from the facility to include their pain medication, the muscle relaxants, and their blood thinner medication. If the patient is still at the rehab facility at time of the two week follow up appointment, the skilled rehab facility will also need to assist the patient in arranging follow up appointment in our office and any transportation needs. ° °MAKE SURE YOU:  °• Understand these instructions.  °• Get help right away if you are not doing well or get worse.  ° ° °Pick up stool softner and laxative for home use following surgery while on pain medications. °Do not submerge incision under water. °Please use good hand washing techniques while changing dressing each day. °May shower starting three days after surgery. °Please use a clean towel to pat the incision dry following showers. °Continue to use ice for pain and swelling after surgery. °Do not use any lotions or creams on the incision until instructed by your surgeon. ° °

## 2018-04-19 NOTE — Plan of Care (Signed)
  Problem: Clinical Measurements: Goal: Ability to maintain clinical measurements within normal limits will improve Outcome: Progressing Goal: Will remain free from infection Outcome: Progressing Goal: Diagnostic test results will improve Outcome: Progressing Goal: Respiratory complications will improve Outcome: Progressing Goal: Cardiovascular complication will be avoided Outcome: Progressing   Problem: Activity: Goal: Risk for activity intolerance will decrease Outcome: Progressing   Problem: Nutrition: Goal: Adequate nutrition will be maintained Outcome: Progressing   Problem: Elimination: Goal: Will not experience complications related to bowel motility Outcome: Progressing Goal: Will not experience complications related to urinary retention Outcome: Progressing   Problem: Pain Managment: Goal: General experience of comfort will improve Outcome: Progressing   Problem: Safety: Goal: Ability to remain free from injury will improve Outcome: Progressing   Problem: Education: Goal: Knowledge of the prescribed therapeutic regimen will improve Outcome: Progressing Goal: Understanding of discharge needs will improve Outcome: Progressing Goal: Individualized Educational Video(s) Outcome: Progressing   Problem: Activity: Goal: Ability to avoid complications of mobility impairment will improve Outcome: Progressing Goal: Ability to tolerate increased activity will improve Outcome: Progressing   Problem: Pain Management: Goal: Pain level will decrease with appropriate interventions Outcome: Progressing

## 2018-04-23 NOTE — Discharge Summary (Signed)
Physician Discharge Summary   Patient ID: RULON ABDALLA MRN: [id number] DOB/AGE: [birth year] 84 y.o.  Admit date: 04/18/2018 Discharge date: 04/19/2018  Primary Diagnosis: Osteoarthritis of the left hip  Admission Diagnoses:  Past Medical History:  Diagnosis Date  . Abnormal EKG    hx left anterior fasicular block on old ekg 2017  . Allergic rhinitis   . Allergy   . Barrett's esophagus   . Benign prostatic hypertrophy   . Colitis, ischemic ([place])    X 2  . Complication of anesthesia    SLOW TO AWAKEN AFTER 1 SURGERY 20  YRS AGO  . DJD (degenerative joint disease)    SPINE, AND OA  . GERD (gastroesophageal reflux disease)   . HOH (hard of hearing)    BOTH EARS  . HOH (hard of hearing)    BOTH EARS  . Hyperlipidemia   . Hypertension    pt denies says cardura for bladder  . Neuropathy     LEFT HAND AND ARM NUMB ALL THE TIME  . Overweight(278.02)    Discharge Diagnoses:   Principal Problem:   OA (osteoarthritis) of hip  Estimated body mass index is 35.58 kg/m as calculated from the following:   Height as of this encounter: 5' 10"  (1.778 m).   Weight as of this encounter: 112.5 kg.  Procedure:  Procedure(s) (LRB): LEFT TOTAL HIP ARTHROPLASTY ANTERIOR APPROACH (Left)   Consults: None  HPI: Isaiah Wilson is a 83 y.o. male who has advanced end-stage arthritis of their Left  hip with progressively worsening pain and dysfunction.The patient has failed nonoperative management and presents for total hip arthroplasty.   Laboratory Data: Admission on 04/18/2018, Discharged on 04/19/2018  Component Date Value Ref Range Status  . WBC 04/19/2018 13.9* 4.0 - 10.5 K/uL Final  . RBC 04/19/2018 4.00* 4.22 - 5.81 MIL/uL Final  . Hemoglobin 04/19/2018 11.5* 13.0 - 17.0 g/dL Final  . HCT 04/19/2018 35.1* 39.0 - 52.0 % Final  . MCV 04/19/2018 87.8  80.0 - 100.0 fL Final  . MCH 04/19/2018 28.8  26.0 - 34.0 pg Final  . MCHC 04/19/2018 32.8  30.0 - 36.0 g/dL Final  . RDW  04/19/2018 14.4  11.5 - 15.5 % Final  . Platelets 04/19/2018 185  150 - 400 K/uL Final  . nRBC 04/19/2018 0.0  0.0 - 0.2 % Final   Performed at [facility], [place] [address], [place], [place] [address]  . Sodium 04/19/2018 138  135 - 145 mmol/L Final  . Potassium 04/19/2018 3.6  3.5 - 5.1 mmol/L Final  . Chloride 04/19/2018 106  98 - 111 mmol/L Final  . CO2 04/19/2018 23  22 - 32 mmol/L Final  . Glucose, Bld 04/19/2018 131* 70 - 99 mg/dL Final  . BUN 04/19/2018 19  8 - 23 mg/dL Final  . Creatinine, Ser 04/19/2018 0.81  0.61 - 1.24 mg/dL Final  . Calcium 04/19/2018 8.3* 8.9 - 10.3 mg/dL Final  . GFR calc non Af Amer 04/19/2018 >60  >60 mL/min Final  . GFR calc Af Amer 04/19/2018 >60  >60 mL/min Final   Comment: (NOTE) The eGFR has been calculated using the CKD EPI equation. This calculation has not been validated in all clinical situations. eGFR's persistently <60 mL/min signify possible Chronic Kidney Disease.   Georgiann Hahn gap 04/19/2018 9  5 - 15 Final   Performed at [facility], [place] [address] [place], [place] [address]  Hospital Outpatient Visit on 04/12/2018  Component Date Value Ref Range Status  . aPTT 04/12/2018 32  24 - 36 seconds Final   Performed at [facility], [place] [address], [place], [place] [address]  . WBC 04/12/2018 8.4  4.0 - 10.5 K/uL Final  . RBC 04/12/2018 4.53  4.22 - 5.81 MIL/uL Final  . Hemoglobin 04/12/2018 13.2  13.0 - 17.0 g/dL Final  . HCT 04/12/2018 40.7  39.0 - 52.0 % Final  . MCV 04/12/2018 89.8  80.0 - 100.0 fL Final  . MCH 04/12/2018 29.1  26.0 - 34.0 pg Final  . MCHC 04/12/2018 32.4  30.0 - 36.0 g/dL Final  . RDW 04/12/2018 14.3  11.5 - 15.5 % Final  . Platelets 04/12/2018 191  150 - 400 K/uL Final  . nRBC 04/12/2018 0.0  0.0 - 0.2 % Final   Performed at [facility], [place] [address], [place], [place] [address]  . Sodium 04/12/2018 140  135 - 145 mmol/L Final  . Potassium  04/12/2018 4.4  3.5 - 5.1 mmol/L Final  . Chloride 04/12/2018 105  98 - 111 mmol/L Final  . CO2 04/12/2018 26  22 - 32 mmol/L Final  . Glucose, Bld 04/12/2018 102* 70 - 99 mg/dL Final  . BUN 04/12/2018 10  8 - 23 mg/dL Final  . Creatinine, Ser 04/12/2018 0.90  0.61 - 1.24 mg/dL Final  . Calcium 04/12/2018 9.0  8.9 - 10.3 mg/dL Final  . Total Protein 04/12/2018 7.6  6.5 - 8.1 g/dL Final  . Albumin 04/12/2018 4.0  3.5 - 5.0 g/dL Final  . AST 04/12/2018 31  15 - 41 U/L Final  . ALT 04/12/2018 24  0 - 44 U/L Final  . Alkaline Phosphatase 04/12/2018 62  38 - 126 U/L Final  . Total Bilirubin 04/12/2018 0.9  0.3 - 1.2 mg/dL Final  . GFR calc non Af Amer 04/12/2018 >60  >60 mL/min Final  . GFR calc Af Amer 04/12/2018 >60  >60 mL/min Final   Comment: (NOTE) The eGFR has been calculated using the CKD EPI equation. This calculation has not been validated in all clinical situations. eGFR's persistently <60 mL/min signify possible Chronic Kidney Disease.   Georgiann Hahn gap 04/12/2018 9  5 - 15 Final   Performed at [facility], [place] [address] [place], [place] [address]  . Prothrombin Time 04/12/2018 13.2  11.4 - 15.2 seconds Final  . INR 04/12/2018 1.01   Final   Performed at [facility], [place] [address] [place], [place] [address]  . ABO/RH(D) 04/12/2018 A POS   Final  . Antibody Screen 04/12/2018 NEG   Final  . Sample Expiration 04/12/2018 04/21/2018   Final  . Extend sample reason 04/12/2018    Final                   Value:NO TRANSFUSIONS OR PREGNANCY IN THE PAST 3 MONTHS Performed at [facility], [place] [address], [place], [place] [address]   . MRSA, PCR 04/12/2018 POSITIVE* NEGATIVE Final   Comment: RESULT CALLED TO, READ BACK BY AND VERIFIED WITH: SHOFFNER,S. RN '@[id number]'$  ON 11.14.19 BY COHEN,K   . Staphylococcus aureus 04/12/2018 POSITIVE* NEGATIVE Final   Comment: (NOTE) The Xpert SA Assay (FDA approved for NASAL specimens in patients  98 years of age and older), is one component of a comprehensive surveillance program. It is not intended to diagnose infection nor to guide or monitor treatment. Performed at [facility], [address] [place], [place] [address]      X-Rays:Dg Pelvis Portable  Result Date: 04/18/2018 CLINICAL DATA:  Hip replacement EXAM: PORTABLE PELVIS 1-2 VIEWS COMPARISON:  Intraop studies from earlier the same day FINDINGS: Components of left hip arthroplasty project in expected location. No fracture or dislocation. Visualized bony pelvis intact. IMPRESSION: Left hip arthroplasty without apparent complication. Electronically Signed   By: Lucrezia Europe M.D.   On: 04/18/2018 15:39   Dg C-arm 1-60 Min-no Report  Result Date: 04/18/2018 Fluoroscopy was utilized by the requesting physician.  No radiographic interpretation.   Dg Hip Operative Unilat W Or W/o Pelvis Left  Result Date: 04/18/2018 CLINICAL DATA:  Left hip replacement EXAM: OPERATIVE LEFT HIP (WITH PELVIS IF PERFORMED) 2 VIEWS TECHNIQUE: Fluoroscopic spot image(s) were submitted for interpretation post-operatively. COMPARISON:  None. FLUOROSCOPY TIME:  19 seconds, 4.47 mGy FINDINGS: Interval left total hip arthroplasty. No hardware failure or complication. IMPRESSION: Interval left total hip arthroplasty. Electronically Signed   By: Kathreen Devoid   On: 04/18/2018 14:10    EKG: Orders placed or performed in visit on 02/07/18  . EKG 12-Lead     Hospital Course: CASYN BECVAR is a 83 y.o. who was admitted to [facility]. They were brought to the operating room on 04/18/2018 and underwent Procedure(s): LEFT TOTAL HIP ARTHROPLASTY ANTERIOR APPROACH.  Patient tolerated the procedure well and was later transferred to the recovery room and then to the orthopaedic floor for postoperative care. They were given PO and IV analgesics for pain control following their surgery. They were given 24 hours of postoperative  antibiotics of  Anti-infectives (From admission, onward)   Start     Dose/Rate Route Frequency Ordered Stop   04/18/18 2000  ceFAZolin (ANCEF) IVPB 2g/100 mL premix     2 g 200 mL/hr over 30 Minutes Intravenous Every 6 hours 04/18/18 1647 04/19/18 0246   04/18/18 1314  ceFAZolin (ANCEF) 2-4 GM/100ML-% IVPB    Note to Pharmacy:  Kennis Carina   : cabinet override      04/18/18 1314 04/18/18 1619   04/18/18 1100  ceFAZolin (ANCEF) IVPB 2g/100 mL premix  Status:  Discontinued     2 g 200 mL/hr over 30 Minutes Intravenous On call to O.R. 04/18/18 1058 04/18/18 1059   04/18/18 0600  vancomycin (VANCOCIN) 1,500 mg in sodium chloride 0.9 % 500 mL IVPB     1,500 mg 250 mL/hr over 120 Minutes Intravenous On call to O.R. 04/17/18 1125 04/18/18 1424     and started on DVT prophylaxis in the form of Aspirin.   PT and OT were ordered for total joint protocol. Discharge planning consulted to help with postop disposition and equipment needs.  Patient had an uneventful night on the evening of surgery. They started to get up OOB with therapy on POD #1. Pt was seen during rounds and was ready to go home pending progress with therapy. Hemovac drain was pulled without difficulty. He worked with therapy on POD #1 and was meeting his goals. Pt was discharged to home later that day in stable condition.  Diet: Regular diet Activity: WBAT Follow-up: in 2 weeks in the office with Dr. Wynelle Link Disposition: Home with HEP Discharged Condition: good   Discharge Instructions    Call MD / Call 911   Complete by:  As directed    If you experience chest pain or shortness of breath, CALL 911 and be transported to the hospital emergency room.  If you develope  a fever above 101 F, pus (white drainage) or increased drainage or redness at the wound, or calf pain, call your surgeon's office.   Change dressing   Complete by:  As directed    You may change your dressing on Friday, then change the dressing daily with sterile  4 x 4 inch gauze dressing and paper tape.   Constipation Prevention   Complete by:  As directed    Drink plenty of fluids.  Prune juice may be helpful.  You may use a stool softener, such as Colace (over the counter) 100 mg twice a day.  Use MiraLax (over the counter) for constipation as needed.   Diet - low sodium heart healthy   Complete by:  As directed    Discharge instructions   Complete by:  As directed    Dr. Gaynelle Arabian Total Joint Specialist [facility] [address], [place], [place] [address] [phone]  ANTERIOR APPROACH TOTAL HIP REPLACEMENT POSTOPERATIVE DIRECTIONS   Hip Rehabilitation, Guidelines Following Surgery  The results of a hip operation are greatly improved after range of motion and muscle strengthening exercises. Follow all safety measures which are given to protect your hip. If any of these exercises cause increased pain or swelling in your joint, decrease the amount until you are comfortable again. Then slowly increase the exercises. Call your caregiver if you have problems or questions.   HOME CARE INSTRUCTIONS  Remove items at home which could result in a fall. This includes throw rugs or furniture in walking pathways.  ICE to the affected hip every three hours for 30 minutes at a time and then as needed for pain and swelling.  Continue to use ice on the hip for pain and swelling from surgery. You may notice swelling that will progress down to the foot and ankle.  This is normal after surgery.  Elevate the leg when you are not up walking on it.   Continue to use the breathing machine which will help keep your temperature down.  It is common for your temperature to cycle up and down following surgery, especially at night when you are not up moving around and exerting yourself.  The breathing machine keeps your lungs expanded and your temperature down.  DIET You may resume your previous home diet once your are discharged from the  hospital.  DRESSING / WOUND CARE / SHOWERING You may shower 3 days after surgery, but keep the wounds dry during showering.  You may use an occlusive plastic wrap (Press'n Seal for example), NO SOAKING/SUBMERGING IN THE BATHTUB.  If the bandage gets wet, change with a clean dry gauze.  If the incision gets wet, pat the wound dry with a clean towel. You may start showering once you are discharged home but do not submerge the incision under water. Just pat the incision dry and apply a dry gauze dressing on daily. Change the surgical dressing daily and reapply a dry dressing each time.  ACTIVITY Walk with your walker as instructed. Use walker as long as suggested by your caregivers. Avoid periods of inactivity such as sitting longer than an hour when not asleep. This helps prevent blood clots.  You may resume a sexual relationship in one month or when given the OK by your doctor.  You may return to work once you are cleared by your doctor.  Do not drive a car for 6 weeks or until released by you surgeon.  Do not drive while taking narcotics.  WEIGHT BEARING Weight bearing as tolerated with assist device (walker, cane, etc) as directed, use it as long as suggested by your surgeon or therapist, typically at least 4-6 weeks.  POSTOPERATIVE CONSTIPATION PROTOCOL Constipation - defined medically as fewer than three stools per week and severe constipation as less than one stool per week.  One of the most common issues patients have following surgery is constipation.  Even if you have a regular bowel pattern at home, your normal regimen is likely to be disrupted due to multiple reasons following surgery.  Combination of anesthesia, postoperative narcotics, change in appetite and fluid intake all can affect your bowels.  In order to avoid complications following surgery, here are some recommendations in order to help you during your recovery period.  Colace (docusate) - Pick up an over-the-counter form  of Colace or another stool softener and take twice a day as long as you are requiring postoperative pain medications.  Take with a full glass of water daily.  If you experience loose stools or diarrhea, hold the colace until you stool forms back up.  If your symptoms do not get better within 1 week or if they get worse, check with your doctor.  Dulcolax (bisacodyl) - Pick up over-the-counter and take as directed by the product packaging as needed to assist with the movement of your bowels.  Take with a full glass of water.  Use this product as needed if not relieved by Colace only.   MiraLax (polyethylene glycol) - Pick up over-the-counter to have on hand.  MiraLax is a solution that will increase the amount of water in your bowels to assist with bowel movements.  Take as directed and can mix with a glass of water, juice, soda, coffee, or tea.  Take if you go more than two days without a movement. Do not use MiraLax more than once per day. Call your doctor if you are still constipated or irregular after using this medication for 7 days in a row.  If you continue to have problems with postoperative constipation, please contact the office for further assistance and recommendations.  If you experience "the worst abdominal pain ever" or develop nausea or vomiting, please contact the office immediatly for further recommendations for treatment.  ITCHING  If you experience itching with your medications, try taking only a single pain pill, or even half a pain pill at a time.  You can also use Benadryl over the counter for itching or also to help with sleep.   TED HOSE STOCKINGS Wear the elastic stockings on both legs for three weeks following surgery during the day but you may remove then at night for sleeping.  MEDICATIONS See your medication summary on the "After Visit Summary" that the nursing staff will review with you prior to discharge.  You may have some home medications which will be placed on hold  until you complete the course of blood thinner medication.  It is important for you to complete the blood thinner medication as prescribed by your surgeon.  Continue your approved medications as instructed at time of discharge.  PRECAUTIONS If you experience chest pain or shortness of breath - call 911 immediately for transfer to the hospital emergency department.  If you develop a fever greater that 101 F, purulent drainage from wound, increased redness or drainage from wound, foul odor from the wound/dressing, or calf pain - CONTACT YOUR SURGEON.  FOLLOW-UP APPOINTMENTS Make sure you keep all of your appointments after your operation with your surgeon and caregivers. You should call the office at the above phone number and make an appointment for approximately two weeks after the date of your surgery or on the date instructed by your surgeon outlined in the "After Visit Summary".  RANGE OF MOTION AND STRENGTHENING EXERCISES  These exercises are designed to help you keep full movement of your hip joint. Follow your caregiver's or physical therapist's instructions. Perform all exercises about fifteen times, three times per day or as directed. Exercise both hips, even if you have had only one joint replacement. These exercises can be done on a training (exercise) mat, on the floor, on a table or on a bed. Use whatever works the best and is most comfortable for you. Use music or television while you are exercising so that the exercises are a pleasant break in your day. This will make your life better with the exercises acting as a break in routine you can look forward to.  Lying on your back, slowly slide your foot toward your buttocks, raising your knee up off the floor. Then slowly slide your foot back down until your leg is straight again.  Lying on your back spread your legs as far apart as you can without causing discomfort.  Lying on your side, raise  your upper leg and foot straight up from the floor as far as is comfortable. Slowly lower the leg and repeat.  Lying on your back, tighten up the muscle in the front of your thigh (quadriceps muscles). You can do this by keeping your leg straight and trying to raise your heel off the floor. This helps strengthen the largest muscle supporting your knee.  Lying on your back, tighten up the muscles of your buttocks both with the legs straight and with the knee bent at a comfortable angle while keeping your heel on the floor.   IF YOU ARE TRANSFERRED TO A SKILLED REHAB FACILITY If the patient is transferred to a skilled rehab facility following release from the hospital, a list of the current medications will be sent to the facility for the patient to continue.  When discharged from the skilled rehab facility, please have the facility set up the patient's [place] prior to being released. Also, the skilled facility will be responsible for providing the patient with their medications at time of release from the facility to include their pain medication, the muscle relaxants, and their blood thinner medication. If the patient is still at the rehab facility at time of the two week follow up appointment, the skilled rehab facility will also need to assist the patient in arranging follow up appointment in our office and any transportation needs.  MAKE SURE YOU:  Understand these instructions.  Get help right away if you are not doing well or get worse.    Pick up stool softner and laxative for home use following surgery while on pain medications. Do not submerge incision under water. Please use good hand washing techniques while changing dressing each day. May shower starting three days after surgery. Please use a clean towel to pat the incision dry following showers. Continue to use ice for pain and swelling after surgery. Do not use any lotions or creams on the incision until  instructed by your surgeon.   Do not sit on low chairs, stoools or toilet seats, as it may be difficult to get up from low  surfaces   Complete by:  As directed    Driving restrictions   Complete by:  As directed    No driving for two weeks   TED hose   Complete by:  As directed    Use stockings (TED hose) for three weeks on both leg(s).  You may remove them at night for sleeping.   Weight bearing as tolerated   Complete by:  As directed      Allergies as of 04/19/2018   No Known Allergies     Medication List    TAKE these medications   acetaminophen 650 MG CR tablet Commonly known as:  TYLENOL Take 1,300 mg by mouth every 8 (eight) hours as needed for pain.   aspirin 325 MG EC tablet Take 1 tablet (325 mg total) by mouth 2 (two) times daily for 20 days. Then resume 81 mg aspirin once a day. What changed:    medication strength  how much to take  when to take this  additional instructions   azelastine 0.1 % nasal spray Commonly known as:  ASTELIN Place 2 sprays into both nostrils every evening.   CENTRUM ADULTS PO Take 1 tablet by mouth daily.   doxazosin 8 MG tablet Commonly known as:  CARDURA TAKE ONE TABLET BY MOUTH EVERY NIGHT AT BEDTIME   EPINEPHrine 0.3 mg/0.3 mL Soaj injection Commonly known as:  EPI-PEN Inject 0.3 mg into the muscle once.   EYE DROPS ADVANCED RELIEF OP Place 2 drops into both eyes daily.   fluticasone 50 MCG/ACT nasal spray Commonly known as:  FLONASE Place 2 sprays into both nostrils daily.   gabapentin 300 MG capsule Commonly known as:  NEURONTIN Take 300 mg by mouth 2 (two) times daily.   HYDROcodone-acetaminophen 5-325 MG tablet Commonly known as:  NORCO/VICODIN Take 1-2 tablets by mouth every 6 (six) hours as needed for moderate pain (pain score 4-6).   loratadine 10 MG tablet Commonly known as:  CLARITIN Take 10 mg by mouth daily.   methocarbamol 500 MG tablet Commonly known as:  ROBAXIN Take 1 tablet (500 mg total)  by mouth every 6 (six) hours as needed for muscle spasms.   montelukast 10 MG tablet Commonly known as:  SINGULAIR TAKE ONE TABLET BY MOUTH EVERY NIGHT AT BEDTIME   NEXIUM 40 MG capsule Generic drug:  esomeprazole TAKE ONE CAPSULE BY MOUTH EVERY MORNING BEFORE BREAKFAST What changed:  See the new instructions.   traMADol 50 MG tablet Commonly known as:  ULTRAM Take 1-2 tablets (50-100 mg total) by mouth every 6 (six) hours as needed for moderate pain (for pain unresponsive to Norco). What changed:    how much to take  reasons to take this   zinc gluconate 50 MG tablet Take 50 mg by mouth daily.            Discharge Care Instructions  (From admission, onward)         Start     Ordered   04/19/18 0000  Weight bearing as tolerated     04/19/18 0711   04/19/18 0000  Change dressing    Comments:  You may change your dressing on Friday, then change the dressing daily with sterile 4 x 4 inch gauze dressing and paper tape.   04/19/18 6283         Follow-up Information    Gaynelle Arabian, MD. Schedule an appointment as soon as possible for a visit on 05/03/2018.   Specialty:  Orthopedic Surgery Contact  information: [address] [place] [address] [phone]           Signed: Griffith Citron, PA-C Orthopedic Surgery 04/23/2018, 3:38 PM

## 2018-05-01 ENCOUNTER — Ambulatory Visit: Payer: Medicare Other | Admitting: Podiatry

## 2018-05-16 ENCOUNTER — Encounter: Payer: Self-pay | Admitting: Podiatry

## 2018-05-16 ENCOUNTER — Ambulatory Visit: Payer: Medicare Other | Admitting: Podiatry

## 2018-05-16 ENCOUNTER — Other Ambulatory Visit: Payer: Self-pay

## 2018-05-16 ENCOUNTER — Ambulatory Visit: Payer: Medicare Other | Admitting: Family Medicine

## 2018-05-16 ENCOUNTER — Encounter: Payer: Self-pay | Admitting: Family Medicine

## 2018-05-16 VITALS — BP 120/64 | HR 87 | Temp 98.1°F | Wt 245.7 lb

## 2018-05-16 DIAGNOSIS — M79674 Pain in right toe(s): Secondary | ICD-10-CM

## 2018-05-16 DIAGNOSIS — B351 Tinea unguium: Secondary | ICD-10-CM

## 2018-05-16 DIAGNOSIS — B3789 Other sites of candidiasis: Secondary | ICD-10-CM

## 2018-05-16 DIAGNOSIS — M79675 Pain in left toe(s): Secondary | ICD-10-CM

## 2018-05-16 DIAGNOSIS — L84 Corns and callosities: Secondary | ICD-10-CM | POA: Diagnosis not present

## 2018-05-16 MED ORDER — NYSTATIN 100000 UNIT/GM EX CREA: 1.0000 "application " | TOPICAL_CREAM | Freq: Two times a day (BID) | CUTANEOUS | 1 refills | Status: DC

## 2018-05-16 NOTE — Progress Notes (Signed)
Subjective:     Patient ID: Isaiah RuddJames L Kempton, male   DOB: 12/26/1934, 83 y.o.   MRN: [id number]  HPI Patient is seen with bilateral groin rash which is slightly pruritic.  First noted couple weeks ago.  He had recent hip replacement surgery on the left which went well.  Has been using Dial soap regularly.  He did have some antibiotics prior to surgery but none since then.  No recent prednisone use.  No history of diabetes.  Past Medical History:  Diagnosis Date  . Abnormal EKG    hx left anterior fasicular block on old ekg 2017  . Allergic rhinitis   . Allergy   . Barrett's esophagus   . Benign prostatic hypertrophy   . Colitis, ischemic (HCC)    X 2  . Complication of anesthesia    SLOW TO AWAKEN AFTER 1 SURGERY 20  YRS AGO  . DJD (degenerative joint disease)    SPINE, AND OA  . GERD (gastroesophageal reflux disease)   . HOH (hard of hearing)    BOTH EARS  . HOH (hard of hearing)    BOTH EARS  . Hyperlipidemia   . Hypertension    pt denies says cardura for bladder  . Neuropathy     LEFT HAND AND ARM NUMB ALL THE TIME  . Overweight(278.02)    Past Surgical History:  Procedure Laterality Date  . 2 neck surgeries  2005-2006   Dr. Georgia DomHirsh--ant cx decompression, diskectomy,fusion C6-7 allograftWITH PLATE  . APPENDECTOMY    . FOOT SURGERY     right x 3  . LAPAROSCOPIC CHOLECYSTECTOMY  1993   Dr. Luan MooreP. Young  . left total knee replacement  07/2007   Dr. Eulah Pontmurphy  . right total knee replacement  2012   Dr. Despina HickAlusio  . SEPTOPLASTY  1975, May 2013   x3  . TONSILLECTOMY  as child  . TOTAL HIP ARTHROPLASTY Left 04/18/2018   Procedure: LEFT TOTAL HIP ARTHROPLASTY ANTERIOR APPROACH;  Surgeon: Ollen GrossAluisio, Frank, MD;  Location: [facility];  Service: Orthopedics;  Laterality: Left;  . TOTAL KNEE REVISION Left 11/11/2015   Procedure: LEFT KNEE POLYETHELENE REVISION;  Surgeon: Ollen GrossFrank Aluisio, MD;  Location: [facility];  Service: Orthopedics;  Laterality: Left;  . TOTAL KNEE REVISION Left 12/07/2016   Procedure: LEFT TOTAL KNEE REVISION;  Surgeon: Ollen GrossAluisio, Frank, MD;  Location: [facility];  Service: Orthopedics;  Laterality: Left;  Adductor Block    reports that he has never smoked. He has never used smokeless tobacco. He reports that he does not drink alcohol or use drugs. family history is not on file. No Known Allergies   Review of Systems  Constitutional: Negative for chills and fever.  Skin: Positive for rash.       Objective:   Physical Exam Constitutional:      Appearance: Normal appearance.  Cardiovascular:     Rate and Rhythm: Normal rate and regular rhythm.  Pulmonary:     Effort: Pulmonary effort is normal.     Breath sounds: Normal breath sounds.  Skin:    Findings: Rash present.     Comments: Diffuse erythematous rash groin bilaterally with some satellite lesions.  Mild scaling.  No pustules.  Nontender.  Does not fluoresce with Joseph [employer]s lamp  Neurological:     Mental Status: He is alert.        Assessment:     Probable Candida (satellite papules) skin rash of the groin bilaterally    Plan:     -Keep  areas dry as possible -Loosefitting undergarments such as boxer shorts -Nystatin cream used twice daily and touch base if not improving over the next couple weeks.  If not improving change therapy to cover dermatophyte infection with cream such as Loprox  Kristian Covey MD [facility]

## 2018-05-16 NOTE — Progress Notes (Signed)
Subjective: Isaiah Wilson presents today with history of neuropathy with cc of painful, mycotic toenails and calluses.  Pain is aggravated when wearing enclosed shoe gear and relieved with periodic professional debridement.  Patient has peripheral neuropathy managed with gabapentin.  Isaiah Wilson voices no new pedal concerns on today's visit.  Kristian CoveyBurchette, Bruce W, MD is his PCP and last visit was 03/28/2018.  Medications    acetaminophen (TYLENOL) 650 MG CR tablet    azelastine (ASTELIN) 0.1 % nasal spray    Azelastine HCl 137 MCG/SPRAY SOLN    CIPRODEX OTIC suspension    doxazosin (CARDURA) 8 MG tablet    EPINEPHrine 0.3 mg/0.3 mL IJ SOAJ injection    fluticasone (FLONASE) 50 MCG/ACT nasal spray    gabapentin (NEURONTIN) 300 MG capsule    HYDROcodone-acetaminophen (NORCO/VICODIN) 5-325 MG tablet    loratadine (CLARITIN) 10 MG tablet    methocarbamol (ROBAXIN) 500 MG tablet    montelukast (SINGULAIR) 10 MG tablet    Multiple Vitamins-Minerals (CENTRUM ADULTS PO)    mupirocin ointment (BACTROBAN) 2 %    mupirocin ointment (BACTROBAN) 2 %    NEXIUM 40 MG capsule    Tetrahydroz-Dextran-PEG-Povid (EYE DROPS ADVANCED RELIEF OP)    traMADol (ULTRAM) 50 MG tablet    zinc gluconate 50 MG tablet       No Known Allergies  Objective:  Vascular Examination: Capillary refill time immediate x 10 digits Dorsalis pedis and Posterior tibial pulses Digital hair x 10 digits was absent Skin temperature gradient WNL b/l  Dermatological Examination: Skin with normal turgor, texture and tone b/l  Toenails 1-5 b/l discolored, thick, dystrophic with subungual debris and pain with palpation to nailbeds due to thickness of nails.  Hyperkeratotic lesions submet head 1 b/l with no erythema, no edema, no drainage, no flocculence.  Musculoskeletal: Muscle strength 5/5 to all muscle groups b/l  Neurological: Sensation with 10 gram monofilament is intact b/l Vibratory sensation intact  b/l  Assessment: 1. Painful onychomycosis toenails 1-5 b/l 2. Callus submet head 1 b/l  Plan: 1. Toenails 1-5 b/l were debrided in length and girth without iatrogenic bleeding. 2. Calluses pared submet head 1 b/l without incident. 3. Patient to continue soft, supportive shoe gear 4. Patient to report any pedal injuries to medical professional  5. Follow up 3 months. Patient/POA to call should there be a concern in the interim.

## 2018-05-16 NOTE — Patient Instructions (Signed)

## 2018-05-16 NOTE — Patient Instructions (Signed)

## 2018-06-11 ENCOUNTER — Encounter: Payer: Self-pay | Admitting: Internal Medicine

## 2018-06-29 ENCOUNTER — Ambulatory Visit (AMBULATORY_SURGERY_CENTER): Payer: Self-pay

## 2018-06-29 ENCOUNTER — Encounter: Payer: Self-pay | Admitting: Internal Medicine

## 2018-06-29 VITALS — Ht 70.0 in | Wt 254.4 lb

## 2018-06-29 DIAGNOSIS — K2270 Barrett's esophagus without dysplasia: Secondary | ICD-10-CM

## 2018-06-29 NOTE — Progress Notes (Signed)
Per pt, no allergies to soy or egg products.Pt not taking any weight loss meds or using  O2 at home.  Pt refused emmi video. 

## 2018-07-12 ENCOUNTER — Ambulatory Visit (AMBULATORY_SURGERY_CENTER): Payer: Medicare Other | Admitting: Internal Medicine

## 2018-07-12 ENCOUNTER — Encounter: Payer: Self-pay | Admitting: Internal Medicine

## 2018-07-12 VITALS — BP 185/100 | HR 62 | Temp 99.1°F | Resp 16 | Ht 70.0 in | Wt 245.0 lb

## 2018-07-12 DIAGNOSIS — K317 Polyp of stomach and duodenum: Secondary | ICD-10-CM | POA: Diagnosis not present

## 2018-07-12 DIAGNOSIS — K449 Diaphragmatic hernia without obstruction or gangrene: Secondary | ICD-10-CM | POA: Diagnosis not present

## 2018-07-12 DIAGNOSIS — K2270 Barrett's esophagus without dysplasia: Secondary | ICD-10-CM

## 2018-07-12 MED ORDER — SODIUM CHLORIDE 0.9 % IV SOLN: 500.0000 mL | Freq: Once | INTRAVENOUS | Status: DC

## 2018-07-12 NOTE — Patient Instructions (Signed)
YOU HAD AN ENDOSCOPIC PROCEDURE TODAY AT THE [facility]:   Refer to the procedure report that was given to you for any specific questions about what was found during the examination.  If the procedure report does not answer your questions, please call your gastroenterologist to clarify.  If you requested that your care partner not be given the details of your procedure findings, then the procedure report has been included in a sealed envelope for you to review at your convenience later.  YOU SHOULD EXPECT: Some feelings of bloating in the abdomen. Passage of more gas than usual.  Walking can help get rid of the air that was put into your GI tract during the procedure and reduce the bloating. If you had a lower endoscopy (such as a colonoscopy or flexible sigmoidoscopy) you may notice spotting of blood in your stool or on the toilet paper. If you underwent a bowel prep for your procedure, you may not have a normal bowel movement for a few days.  Please Note:  You might notice some irritation and congestion in your nose or some drainage.  This is from the oxygen used during your procedure.  There is no need for concern and it should clear up in a day or so.  SYMPTOMS TO REPORT IMMEDIATELY:   Following upper endoscopy (EGD)  Vomiting of blood or coffee ground material  New chest pain or pain under the shoulder blades  Painful or persistently difficult swallowing  New shortness of breath  Fever of 100F or higher  Black, tarry-looking stools  For urgent or emergent issues, a gastroenterologist can be reached at any hour by calling [phone].   DIET:  We do recommend a small meal at first, but then you may proceed to your regular diet.  Drink plenty of fluids but you should avoid alcoholic beverages for 24 hours.  ACTIVITY:  You should plan to take it easy for the rest of today and you should NOT DRIVE or use heavy machinery until tomorrow (because of the sedation medicines used  during the test).    FOLLOW UP: Our staff will call the number listed on your records the next business day following your procedure to check on you and address any questions or concerns that you may have regarding the information given to you following your procedure. If we do not reach you, we will leave a message.  However, if you are feeling well and you are not experiencing any problems, there is no need to return our call.  We will assume that you have returned to your regular daily activities without incident.  If any biopsies were taken you will be contacted by phone or by letter within the next 1-3 weeks.  Please call us at [phone] if you have not heard about the biopsies in 3 weeks.    SIGNATURES/CONFIDENTIALITY: You and/or your care partner have signed paperwork which will be entered into your electronic medical record.  These signatures attest to the fact that that the information above on your After Visit Summary has been reviewed and is understood.  Full responsibility of the confidentiality of this discharge information lies with you and/or your care-partner. 

## 2018-07-12 NOTE — Progress Notes (Signed)
Report to PACU, RN, vss, BBS= Clear.  

## 2018-07-12 NOTE — Progress Notes (Signed)
Called to room to assist during endoscopic procedure.  Patient ID and intended procedure confirmed with present staff. Received instructions for my participation in the procedure from the performing physician.  

## 2018-07-12 NOTE — Op Note (Signed)
[facility] Patient Name: Isaiah Wilson Procedure Date: 07/12/2018 8:06 AM MRN: [id number] Endoscopist: Wilhemina Bonito. Marina Goodell , MD Age: 84 Referring MD:  Date of Birth: January 19, 1935 Gender: Male Account #: [id number] Procedure:                Upper GI endoscopy with biopsies Indications:              Surveillance for malignancy due to personal history                            of Barrett's esophagus. Last examination October                            2016 with nondysplastic short segment Barrett's                            esophagus Medicines:                Monitored Anesthesia Care Procedure:                Pre-Anesthesia Assessment:                           - Prior to the procedure, a History and Physical                            was performed, and patient medications and                            allergies were reviewed. The patient's tolerance of                            previous anesthesia was also reviewed. The risks                            and benefits of the procedure and the sedation                            options and risks were discussed with the patient.                            All questions were answered, and informed consent                            was obtained. Prior Anticoagulants: The patient has                            taken no previous anticoagulant or antiplatelet                            agents. ASA Grade Assessment: II - A patient with                            mild systemic disease. After reviewing the risks  and benefits, the patient was deemed in                            satisfactory condition to undergo the procedure.                           After obtaining informed consent, the endoscope was                            passed under direct vision. Throughout the                            procedure, the patient's blood pressure, pulse, and                            oxygen saturations were monitored  continuously. The                            Endoscope was introduced through the mouth, and                            advanced to the second part of duodenum. The upper                            GI endoscopy was accomplished without difficulty.                            The patient tolerated the procedure well. Scope In: Scope Out: Findings:                 There were esophageal mucosal changes secondary to                            established short-segment Barrett's disease present                            in the lower third of the esophagus. There was no                            inflammation, irregularity, or nodularity.                            Examination was performed under white light and                            NBI. The maximum longitudinal extent of these                            mucosal changes was 3 cm (C1,M3) in length. Mucosa                            was biopsied with a cold forceps for histology.  The esophagus was is normal.                           The stomach was normal save multiple benign fundic                            gland type polyps and a sliding hiatal hernia.                           The examined duodenum was normal.                           The cardia and gastric fundus were normal on                            retroflexion. Complications:            No immediate complications. Estimated Blood Loss:     Estimated blood loss: none. Impression:               1. Short segment Barrett's esophagus status post                            biopsies                           2. Incidental benign gastric polyps. Otherwise                            negative exam. Recommendation:           - Patient has a contact number available for                            emergencies. The signs and symptoms of potential                            delayed complications were discussed with the                            patient. Return to  normal activities tomorrow.                            Written discharge instructions were provided to the                            patient.                           - Resume previous diet.                           - Continue present medications.                           - Await pathology results. If biopsies negative for  dysplasia then no further surveillance is                            recommended. Wilhemina Bonito. Marina Goodell, MD 07/12/2018 8:31:42 AM This report has been signed electronically.

## 2018-07-12 NOTE — Progress Notes (Signed)
Pt's states no medical or surgical changes since previsit or office visit. 

## 2018-07-13 ENCOUNTER — Telehealth: Payer: Self-pay | Admitting: *Deleted

## 2018-07-13 NOTE — Telephone Encounter (Signed)
  Follow up Call-  Call back number 07/12/2018  Post procedure Call Back phone  # [phone]  Permission to leave phone message Yes  Some recent data might be hidden     Patient questions:  Do you have a fever, pain , or abdominal swelling? No. Pain Score  0 *  Have you tolerated food without any problems? Yes.    Have you been able to return to your normal activities? Yes.    Do you have any questions about your discharge instructions: Diet   No. Medications  No. Follow up visit  No.  Do you have questions or concerns about your Care? No.  Actions: * If pain score is 4 or above: No action needed, pain <4.

## 2018-07-16 ENCOUNTER — Encounter: Payer: Self-pay | Admitting: Internal Medicine

## 2018-07-18 ENCOUNTER — Ambulatory Visit: Payer: Medicare Other | Admitting: Podiatry

## 2018-11-15 ENCOUNTER — Other Ambulatory Visit: Payer: Self-pay | Admitting: Family Medicine

## 2019-02-12 ENCOUNTER — Other Ambulatory Visit: Payer: Self-pay | Admitting: Family Medicine

## 2019-02-15 ENCOUNTER — Other Ambulatory Visit: Payer: Self-pay | Admitting: Family Medicine

## 2019-03-05 NOTE — Patient Instructions (Addendum)
DUE TO COVID-19 ONLY ONE VISITOR IS ALLOWED TO COME WITH YOU AND STAY IN THE WAITING ROOM ONLY DURING PRE OP AND PROCEDURE DAY OF SURGERY. THE 1 VISITOR MAY VISIT WITH YOU AFTER SURGERY IN YOUR PRIVATE ROOM DURING VISITING HOURS ONLY!  YOU NEED TO HAVE A COVID 19 TEST ON_Saturday 10/10/2020______  am_____, THIS TEST MUST BE DONE BEFORE SURGERY, COME  [address], [place] , [address].  [facility]) ONCE YOUR COVID TEST IS COMPLETED, PLEASE BEGIN THE QUARANTINE INSTRUCTIONS AS OUTLINED IN YOUR HANDOUT.                Kyung Rudd    Your procedure is scheduled on: Wednesday 03/13/2019   Report to [facility] Main  Entrance    Report to admitting at 12:50pm      Call this number if you have problems the morning of surgery [phone]    Remember: Do not eat food After Midnight.    BRUSH YOUR TEETH MORNING OF SURGERY AND RINSE YOUR MOUTH OUT, NO CHEWING GUM CANDY OR MINTS.    NO SOLID FOOD AFTER MIDNIGHT THE NIGHT PRIOR TO SURGERY. NOTHING BY MOUTH EXCEPT CLEAR LIQUIDS UNTIL 12:20pm.              PLEASE FINISH ENSURE DRINK PER SURGEON ORDER   WHICH NEEDS TO BE COMPLETED AT 12:20pm.   CLEAR LIQUID DIET   Foods Allowed                                                                     Foods Excluded  Coffee and tea, regular and decaf                             liquids that you cannot  Plain Jell-O any favor except red or purple                                           see through such as: Fruit ices (not with fruit pulp)                                     milk, soups, orange juice  Iced Popsicles                                    All solid food Carbonated beverages, regular and diet                                    Cranberry, grape and apple juices Sports drinks like Gatorade Lightly seasoned clear broth or consume(fat free) Sugar, honey syrup  Sample Menu Breakfast                                Lunch  Supper Cranberry juice                    Beef broth                            Chicken broth Jell-O                                     Grape juice                           Apple juice Coffee or tea                        Jell-O                                      Popsicle                                                Coffee or tea                        Coffee or tea  _____________________________________________________________________       Take these medicines the morning of surgery with A SIP OF WATER: Claritin, Flonase is needed, Tramadol (Ultram) if needed for pain                                 You may not have any metal on your body including hair pins and              piercings  Do not wear jewelry, make-up, lotions, powders or perfumes, deodorant                        Men may shave face and neck.   Do not bring valuables to the hospital. [place] IS NOT             RESPONSIBLE   FOR VALUABLES.  Contacts, dentures or bridgework may not be worn into surgery.  Leave suitcase in the car. After surgery it may be brought to your room.                  Please read over the following fact sheets you were given: _____________________________________________________________________             [facility] - Preparing for [facility] Before surgery, you can play an important role.  Because skin is not sterile, your skin needs to be as free of germs as possible.  You can reduce the number of germs on your skin by washing with CHG (chlorahexidine gluconate) soap before surgery.  CHG is an antiseptic cleaner which kills germs and bonds with the skin to continue killing germs even after washing. Please DO NOT use if you have an allergy to CHG or antibacterial soaps.  If your skin becomes reddened/irritated stop using the CHG and inform your nurse when you arrive at Short Stay. Do not shave (including legs and underarms) for  at least 48 hours prior to the first CHG  shower.  You may shave your face/neck. Please follow these instructions carefully:  1.  Shower with CHG Soap the night before surgery and the  morning of Surgery.  2.  If you choose to wash your hair, wash your hair first as usual with your  normal  shampoo.  3.  After you shampoo, rinse your hair and body thoroughly to remove the  shampoo.                           4.  Use CHG as you would any other liquid soap.  You can apply chg directly  to the skin and wash                       Gently with a scrungie or clean washcloth.  5.  Apply the CHG Soap to your body ONLY FROM THE NECK DOWN.   Do not use on face/ open                           Wound or open sores. Avoid contact with eyes, ears mouth and genitals (private parts).                       Wash face,  Genitals (private parts) with your normal soap.             6.  Wash thoroughly, paying special attention to the area where your surgery  will be performed.  7.  Thoroughly rinse your body with warm water from the neck down.  8.  DO NOT shower/wash with your normal soap after using and rinsing off  the CHG Soap.                9.  Pat yourself dry with a clean towel.            10.  Wear clean pajamas.            11.  Place clean sheets on your bed the night of your first shower and do not  sleep with pets. Day of Surgery : Do not apply any lotions/deodorants the morning of surgery.  Please wear clean clothes to the hospital/surgery center.  FAILURE TO FOLLOW THESE INSTRUCTIONS MAY RESULT IN THE CANCELLATION OF YOUR SURGERY PATIENT SIGNATURE_________________________________  NURSE SIGNATURE__________________________________  ________________________________________________________________________   Adam Phenix  An incentive spirometer is a tool that can help keep your lungs clear and active. This tool measures how well you are filling your lungs with each breath. Taking long deep breaths may help reverse or decrease the chance  of developing breathing (pulmonary) problems (especially infection) following:  A long period of time when you are unable to move or be active. BEFORE THE PROCEDURE   If the spirometer includes an indicator to show your best effort, your nurse or respiratory therapist will set it to a desired goal.  If possible, sit up straight or lean slightly forward. Try not to slouch.  Hold the incentive spirometer in an upright position. INSTRUCTIONS FOR USE  1. Sit on the edge of your bed if possible, or sit up as far as you can in bed or on a chair. 2. Hold the incentive spirometer in an upright position. 3. Breathe out normally. 4. Place the mouthpiece in your mouth and seal your lips tightly  around it. 5. Breathe in slowly and as deeply as possible, raising the piston or the ball toward the top of the column. 6. Hold your breath for 3-5 seconds or for as long as possible. Allow the piston or ball to fall to the bottom of the column. 7. Remove the mouthpiece from your mouth and breathe out normally. 8. Rest for a few seconds and repeat Steps 1 through 7 at least 10 times every 1-2 hours when you are awake. Take your time and take a few normal breaths between deep breaths. 9. The spirometer may include an indicator to show your best effort. Use the indicator as a goal to work toward during each repetition. 10. After each set of 10 deep breaths, practice coughing to be sure your lungs are clear. If you have an incision (the cut made at the time of surgery), support your incision when coughing by placing a pillow or rolled up towels firmly against it. Once you are able to get out of bed, walk around indoors and cough well. You may stop using the incentive spirometer when instructed by your caregiver.  RISKS AND COMPLICATIONS  Take your time so you do not get dizzy or light-headed.  If you are in pain, you may need to take or ask for pain medication before doing incentive spirometry. It is harder to  take a deep breath if you are having pain. AFTER USE  Rest and breathe slowly and easily.  It can be helpful to keep track of a log of your progress. Your caregiver can provide you with a simple table to help with this. If you are using the spirometer at home, follow these instructions: [place] IF:   You are having difficultly using the spirometer.  You have trouble using the spirometer as often as instructed.  Your pain medication is not giving enough relief while using the spirometer.  You develop fever of 100.5 F (38.1 C) or higher. SEEK IMMEDIATE MEDICAL CARE IF:   You cough up bloody sputum that had not been present before.  You develop fever of 102 F (38.9 C) or greater.  You develop worsening pain at or near the incision site. MAKE SURE YOU:   Understand these instructions.  Will watch your condition.  Will get help right away if you are not doing well or get worse. Document Released: 09/26/2006 Document Revised: 08/08/2011 Document Reviewed: 11/27/2006 Exit[employer] Patient Information 2014 Exit[employer], [place].   ________________________________________________________________________  WHAT IS A BLOOD TRANSFUSION? Blood Transfusion Information  A transfusion is the replacement of blood or some of its parts. Blood is made up of multiple cells which provide different functions.  Red blood cells carry oxygen and are used for blood loss replacement.  White blood cells fight against infection.  Platelets control bleeding.  Plasma helps clot blood.  Other blood products are available for specialized needs, such as hemophilia or other clotting disorders. BEFORE THE TRANSFUSION  Who gives blood for transfusions?   Healthy volunteers who are fully evaluated to make sure their blood is safe. This is blood bank blood. Transfusion therapy is the safest it has ever been in the practice of medicine. Before blood is taken from a donor, a complete history is taken to  make sure that person has no history of diseases nor engages in risky social behavior (examples are intravenous drug use or sexual activity with multiple partners). The donor's travel history is screened to minimize risk of transmitting infections, such as malaria. The  donated blood is tested for signs of infectious diseases, such as HIV and hepatitis. The blood is then tested to be sure it is compatible with you in order to minimize the chance of a transfusion reaction. If you or a relative donates blood, this is often done in anticipation of surgery and is not appropriate for emergency situations. It takes many days to process the donated blood. RISKS AND COMPLICATIONS Although transfusion therapy is very safe and saves many lives, the main dangers of transfusion include:   Getting an infectious disease.  Developing a transfusion reaction. This is an allergic reaction to something in the blood you were given. Every precaution is taken to prevent this. The decision to have a blood transfusion has been considered carefully by your caregiver before blood is given. Blood is not given unless the benefits outweigh the risks. AFTER THE TRANSFUSION  Right after receiving a blood transfusion, you will usually feel much better and more energetic. This is especially true if your red blood cells have gotten low (anemic). The transfusion raises the level of the red blood cells which carry oxygen, and this usually causes an energy increase.  The nurse administering the transfusion will monitor you carefully for complications. HOME CARE INSTRUCTIONS  No special instructions are needed after a transfusion. You may find your energy is better. Speak with your caregiver about any limitations on activity for underlying diseases you may have. SEEK MEDICAL CARE IF:   Your condition is not improving after your transfusion.  You develop redness or irritation at the intravenous (IV) site. SEEK IMMEDIATE MEDICAL CARE  IF:  Any of the following symptoms occur over the next 12 hours:  Shaking chills.  You have a temperature by mouth above 102 F (38.9 C), not controlled by medicine.  Chest, back, or muscle pain.  People around you feel you are not acting correctly or are confused.  Shortness of breath or difficulty breathing.  Dizziness and fainting.  You get a rash or develop hives.  You have a decrease in urine output.  Your urine turns a dark color or changes to pink, red, or brown. Any of the following symptoms occur over the next 10 days:  You have a temperature by mouth above 102 F (38.9 C), not controlled by medicine.  Shortness of breath.  Weakness after normal activity.  The white part of the eye turns yellow (jaundice).  You have a decrease in the amount of urine or are urinating less often.  Your urine turns a dark color or changes to pink, red, or brown. Document Released: 05/13/2000 Document Revised: 08/08/2011 Document Reviewed: 12/31/2007 [facility] Patient Information 2014 [place], [place].  _______________________________________________________________________

## 2019-03-07 ENCOUNTER — Other Ambulatory Visit: Payer: Self-pay

## 2019-03-07 ENCOUNTER — Encounter (HOSPITAL_COMMUNITY): Payer: Self-pay

## 2019-03-07 ENCOUNTER — Encounter (HOSPITAL_COMMUNITY)
Admission: RE | Admit: 2019-03-07 | Discharge: 2019-03-07 | Disposition: A | Discharge status: Home / Self Care | Payer: Medicare Other | Source: Ambulatory Visit | Attending: Orthopedic Surgery | Admitting: Orthopedic Surgery

## 2019-03-07 DIAGNOSIS — M1611 Unilateral primary osteoarthritis, right hip: Secondary | ICD-10-CM | POA: Insufficient documentation

## 2019-03-07 DIAGNOSIS — I491 Atrial premature depolarization: Secondary | ICD-10-CM | POA: Diagnosis not present

## 2019-03-07 DIAGNOSIS — Z01818 Encounter for other preprocedural examination: Secondary | ICD-10-CM | POA: Insufficient documentation

## 2019-03-07 DIAGNOSIS — I444 Left anterior fascicular block: Secondary | ICD-10-CM | POA: Insufficient documentation

## 2019-03-07 LAB — CBC WITH DIFFERENTIAL/PLATELET
Abs Immature Granulocytes: 0.02 10*3/uL (ref 0.00–0.07)
Basophils Absolute: 0.1 10*3/uL (ref 0.0–0.1)
Basophils Relative: 1 %
Eosinophils Absolute: 0.2 10*3/uL (ref 0.0–0.5)
Eosinophils Relative: 2 %
HCT: 39.7 % (ref 39.0–52.0)
Hemoglobin: 12.7 g/dL — ABNORMAL LOW (ref 13.0–17.0)
Immature Granulocytes: 0 %
Lymphocytes Relative: 34 %
Lymphs Abs: 3.2 10*3/uL (ref 0.7–4.0)
MCH: 28.7 pg (ref 26.0–34.0)
MCHC: 32 g/dL (ref 30.0–36.0)
MCV: 89.8 fL (ref 80.0–100.0)
Monocytes Absolute: 0.8 10*3/uL (ref 0.1–1.0)
Monocytes Relative: 9 %
Neutro Abs: 5.2 10*3/uL (ref 1.7–7.7)
Neutrophils Relative %: 54 %
Platelets: 226 10*3/uL (ref 150–400)
RBC: 4.42 MIL/uL (ref 4.22–5.81)
RDW: 14.4 % (ref 11.5–15.5)
WBC: 9.6 10*3/uL (ref 4.0–10.5)
nRBC: 0 % (ref 0.0–0.2)

## 2019-03-07 LAB — COMPREHENSIVE METABOLIC PANEL
ALT: 18 U/L (ref 0–44)
AST: 24 U/L (ref 15–41)
Albumin: 4.2 g/dL (ref 3.5–5.0)
Alkaline Phosphatase: 64 U/L (ref 38–126)
Anion gap: 9 (ref 5–15)
BUN: 18 mg/dL (ref 8–23)
CO2: 24 mmol/L (ref 22–32)
Calcium: 9.3 mg/dL (ref 8.9–10.3)
Chloride: 107 mmol/L (ref 98–111)
Creatinine, Ser: 0.89 mg/dL (ref 0.61–1.24)
GFR calc Af Amer: >60 mL/min (ref >60)
GFR calc non Af Amer: >60 mL/min (ref >60)
Glucose, Bld: 94 mg/dL (ref 70–99)
Potassium: 3.9 mmol/L (ref 3.5–5.1)
Sodium: 140 mmol/L (ref 135–145)
Total Bilirubin: 0.6 mg/dL (ref 0.3–1.2)
Total Protein: 7.5 g/dL (ref 6.5–8.1)

## 2019-03-07 LAB — PROTIME-INR
INR: 1 (ref 0.8–1.2)
Prothrombin Time: 13.1 seconds (ref 11.4–15.2)

## 2019-03-07 LAB — APTT: aPTT: 33 seconds (ref 24–36)

## 2019-03-07 NOTE — Progress Notes (Signed)
PCP - Dr. Carolann Littler Cardiologist - None  Chest x-ray - 03/16/2018 EKG - none Stress Test - none ECHO - none Cardiac Cath - none  Sleep Study - none CPAP - none  Fasting Blood Sugar - none Checks Blood Sugar _____ times a day  Blood Thinner Instructions:none Aspirin Instructions: Aspirin 81 mg prescribed by Dr. Carolann Littler. Instructed patient to call Dr. Elease Hashimoto to get instructions as to when he is to stop his Aspirin. Patient verbalized understanding. Last Dose:  Anesthesia review:   Patient denies shortness of breath, fever, cough and chest pain at PAT appointment   Patient verbalized understanding of instructions that were given to them at the PAT appointment. Patient was also instructed that they will need to review over the PAT instructions again at home before surgery.

## 2019-03-07 NOTE — Progress Notes (Signed)
   03/07/19 1424  OBSTRUCTIVE SLEEP APNEA  Have you ever been diagnosed with sleep apnea through a sleep study? No  Do you snore loudly (loud enough to be heard through closed doors)?  0  Do you often feel tired, fatigued, or sleepy during the daytime (such as falling asleep during driving or talking to someone)? 1  Has anyone observed you stop breathing during your sleep? 1  Do you have, or are you being treated for high blood pressure? 0  BMI more than 35 kg/m2? 0  Age > 50 (1-yes) 1  Neck circumference greater than:Male 16 inches or larger, Male 17inches or larger? 1  Male Gender (Yes=1) 1  Obstructive Sleep Apnea Score 5

## 2019-03-08 LAB — SURGICAL PCR SCREEN
MRSA, PCR: POSITIVE — AB
Staphylococcus aureus: POSITIVE — AB

## 2019-03-09 ENCOUNTER — Other Ambulatory Visit (HOSPITAL_COMMUNITY)
Admission: RE | Admit: 2019-03-09 | Discharge: 2019-03-09 | Disposition: A | Discharge status: Home / Self Care | Payer: Medicare Other | Source: Ambulatory Visit | Attending: Orthopedic Surgery | Admitting: Orthopedic Surgery

## 2019-03-09 DIAGNOSIS — Z20828 Contact with and (suspected) exposure to other viral communicable diseases: Secondary | ICD-10-CM | POA: Diagnosis not present

## 2019-03-09 DIAGNOSIS — Z01812 Encounter for preprocedural laboratory examination: Secondary | ICD-10-CM | POA: Diagnosis present

## 2019-03-10 LAB — NOVEL CORONAVIRUS, NAA (HOSP ORDER, SEND-OUT TO REF LAB; TAT 18-24 HRS): SARS-CoV-2, NAA: NOT DETECTED

## 2019-03-12 ENCOUNTER — Other Ambulatory Visit: Payer: Self-pay | Admitting: Family Medicine

## 2019-03-12 MED ORDER — VANCOMYCIN HCL 10 G IV SOLR
1500.0000 mg | INTRAVENOUS | Status: AC
  Administered 2019-03-13: 1500 mg via INTRAVENOUS
  Filled 2019-03-12: qty 1500

## 2019-03-13 ENCOUNTER — Inpatient Hospital Stay (HOSPITAL_COMMUNITY)
Admission: RE | Admit: 2019-03-13 | Discharge: 2019-03-14 | DRG: 470 | Disposition: A | Discharge status: Home / Self Care | Payer: Medicare Other | Attending: Orthopedic Surgery | Admitting: Orthopedic Surgery

## 2019-03-13 ENCOUNTER — Encounter (HOSPITAL_COMMUNITY)
Admission: RE | Disposition: A | Discharge status: Home / Self Care | Payer: Self-pay | Source: Home / Self Care | Attending: Orthopedic Surgery

## 2019-03-13 ENCOUNTER — Other Ambulatory Visit: Payer: Self-pay

## 2019-03-13 ENCOUNTER — Telehealth (HOSPITAL_COMMUNITY): Payer: Self-pay | Admitting: *Deleted

## 2019-03-13 ENCOUNTER — Encounter (HOSPITAL_COMMUNITY): Payer: Self-pay | Admitting: *Deleted

## 2019-03-13 ENCOUNTER — Inpatient Hospital Stay (HOSPITAL_COMMUNITY): Payer: Medicare Other

## 2019-03-13 ENCOUNTER — Inpatient Hospital Stay (HOSPITAL_COMMUNITY): Payer: Medicare Other | Admitting: Physician Assistant

## 2019-03-13 ENCOUNTER — Inpatient Hospital Stay (HOSPITAL_COMMUNITY): Payer: Medicare Other | Admitting: Anesthesiology

## 2019-03-13 DIAGNOSIS — K219 Gastro-esophageal reflux disease without esophagitis: Secondary | ICD-10-CM | POA: Diagnosis present

## 2019-03-13 DIAGNOSIS — Z96642 Presence of left artificial hip joint: Secondary | ICD-10-CM | POA: Diagnosis not present

## 2019-03-13 DIAGNOSIS — M169 Osteoarthritis of hip, unspecified: Secondary | ICD-10-CM | POA: Diagnosis present

## 2019-03-13 DIAGNOSIS — Z419 Encounter for procedure for purposes other than remedying health state, unspecified: Secondary | ICD-10-CM

## 2019-03-13 DIAGNOSIS — Z8249 Family history of ischemic heart disease and other diseases of the circulatory system: Secondary | ICD-10-CM

## 2019-03-13 DIAGNOSIS — Z96649 Presence of unspecified artificial hip joint: Secondary | ICD-10-CM

## 2019-03-13 DIAGNOSIS — I10 Essential (primary) hypertension: Secondary | ICD-10-CM | POA: Diagnosis not present

## 2019-03-13 DIAGNOSIS — M1611 Unilateral primary osteoarthritis, right hip: Principal | ICD-10-CM | POA: Diagnosis present

## 2019-03-13 DIAGNOSIS — Z96653 Presence of artificial knee joint, bilateral: Secondary | ICD-10-CM | POA: Diagnosis present

## 2019-03-13 HISTORY — PX: TOTAL HIP ARTHROPLASTY: SHX124

## 2019-03-13 LAB — TYPE AND SCREEN
ABO/RH(D): A POS
Antibody Screen: NEGATIVE

## 2019-03-13 SURGERY — ARTHROPLASTY, HIP, TOTAL, ANTERIOR APPROACH
Anesthesia: Spinal | Site: Hip | Laterality: Right

## 2019-03-13 MED ORDER — SODIUM CHLORIDE 0.9 % IV SOLN
INTRAVENOUS | Status: DC | PRN
  Administered 2019-03-13: 40 ug via INTRAVENOUS

## 2019-03-13 MED ORDER — DEXAMETHASONE SODIUM PHOSPHATE 10 MG/ML IJ SOLN: 8.0000 mg | Freq: Once | INTRAMUSCULAR | Status: DC

## 2019-03-13 MED ORDER — METHOCARBAMOL 500 MG PO TABS
500.0000 mg | ORAL_TABLET | Freq: Four times a day (QID) | ORAL | Status: DC | PRN
  Administered 2019-03-13 – 2019-03-14 (×4): 500 mg via ORAL
  Filled 2019-03-13 (×4): qty 1

## 2019-03-13 MED ORDER — CEFAZOLIN SODIUM-DEXTROSE 2-4 GM/100ML-% IV SOLN: 2.0000 g | INTRAVENOUS | Status: DC

## 2019-03-13 MED ORDER — BUPIVACAINE IN DEXTROSE 0.75-8.25 % IT SOLN
INTRATHECAL | Status: DC | PRN
  Administered 2019-03-13: 2 mL via INTRATHECAL

## 2019-03-13 MED ORDER — MENTHOL 3 MG MT LOZG: 1.0000 | LOZENGE | OROMUCOSAL | Status: DC | PRN

## 2019-03-13 MED ORDER — ACETAMINOPHEN 500 MG PO TABS
500.0000 mg | ORAL_TABLET | Freq: Four times a day (QID) | ORAL | Status: AC
  Administered 2019-03-13 – 2019-03-14 (×4): 500 mg via ORAL
  Filled 2019-03-13 (×4): qty 1

## 2019-03-13 MED ORDER — CHLORHEXIDINE GLUCONATE 4 % EX LIQD: 60.0000 mL | Freq: Once | CUTANEOUS | Status: DC

## 2019-03-13 MED ORDER — METHOCARBAMOL 500 MG IVPB - SIMPLE MED
500.0000 mg | Freq: Four times a day (QID) | INTRAVENOUS | Status: DC | PRN
  Filled 2019-03-13 (×2): qty 50

## 2019-03-13 MED ORDER — SODIUM CHLORIDE 0.9 % IV SOLN
INTRAVENOUS | Status: DC
  Administered 2019-03-13 – 2019-03-14 (×2): via INTRAVENOUS

## 2019-03-13 MED ORDER — BUPIVACAINE HCL (PF) 0.25 % IJ SOLN
INTRAMUSCULAR | Status: AC
  Filled 2019-03-13: qty 30

## 2019-03-13 MED ORDER — ASPIRIN EC 325 MG PO TBEC
325.0000 mg | DELAYED_RELEASE_TABLET | Freq: Two times a day (BID) | ORAL | Status: DC
  Administered 2019-03-14: 325 mg via ORAL
  Filled 2019-03-13: qty 1

## 2019-03-13 MED ORDER — POLYETHYLENE GLYCOL 3350 17 G PO PACK: 17.0000 g | PACK | Freq: Every day | ORAL | Status: DC | PRN

## 2019-03-13 MED ORDER — PHENYLEPHRINE HCL (PRESSORS) 10 MG/ML IV SOLN
INTRAVENOUS | Status: AC
  Filled 2019-03-13: qty 1

## 2019-03-13 MED ORDER — PANTOPRAZOLE SODIUM 40 MG PO TBEC
80.0000 mg | DELAYED_RELEASE_TABLET | Freq: Every day | ORAL | Status: DC
  Administered 2019-03-13: 80 mg via ORAL
  Filled 2019-03-13: qty 2

## 2019-03-13 MED ORDER — ONDANSETRON HCL 4 MG/2ML IJ SOLN: 4.0000 mg | Freq: Four times a day (QID) | INTRAMUSCULAR | Status: DC | PRN

## 2019-03-13 MED ORDER — CEFAZOLIN SODIUM-DEXTROSE 2-4 GM/100ML-% IV SOLN
2.0000 g | INTRAVENOUS | Status: AC
  Administered 2019-03-13: 14:00:00 2 g via INTRAVENOUS
  Filled 2019-03-13: qty 100

## 2019-03-13 MED ORDER — DOCUSATE SODIUM 100 MG PO CAPS
100.0000 mg | ORAL_CAPSULE | Freq: Two times a day (BID) | ORAL | Status: DC
  Administered 2019-03-13 – 2019-03-14 (×2): 100 mg via ORAL
  Filled 2019-03-13 (×2): qty 1

## 2019-03-13 MED ORDER — MIDAZOLAM HCL 2 MG/2ML IJ SOLN
INTRAMUSCULAR | Status: AC
  Filled 2019-03-13: qty 2

## 2019-03-13 MED ORDER — LACTATED RINGERS IV SOLN
INTRAVENOUS | Status: DC
  Administered 2019-03-13 (×2): via INTRAVENOUS

## 2019-03-13 MED ORDER — DOXAZOSIN MESYLATE 8 MG PO TABS
8.0000 mg | ORAL_TABLET | Freq: Every day | ORAL | Status: DC
  Administered 2019-03-13: 8 mg via ORAL
  Filled 2019-03-13 (×2): qty 1

## 2019-03-13 MED ORDER — DEXAMETHASONE SODIUM PHOSPHATE 10 MG/ML IJ SOLN
INTRAMUSCULAR | Status: DC | PRN
  Administered 2019-03-13: 10 mg via INTRAVENOUS

## 2019-03-13 MED ORDER — MORPHINE SULFATE (PF) 2 MG/ML IV SOLN
0.5000 mg | INTRAVENOUS | Status: DC | PRN
  Administered 2019-03-13 – 2019-03-14 (×3): 1 mg via INTRAVENOUS
  Filled 2019-03-13 (×3): qty 1

## 2019-03-13 MED ORDER — BUPIVACAINE HCL 0.25 % IJ SOLN
INTRAMUSCULAR | Status: DC | PRN
  Administered 2019-03-13: 30 mL

## 2019-03-13 MED ORDER — WATER FOR IRRIGATION, STERILE IR SOLN
Status: DC | PRN
  Administered 2019-03-13: 2000 mL

## 2019-03-13 MED ORDER — OXYCODONE HCL 5 MG PO TABS: 5.0000 mg | ORAL_TABLET | Freq: Once | ORAL | Status: DC | PRN

## 2019-03-13 MED ORDER — FLEET ENEMA 7-19 GM/118ML RE ENEM: 1.0000 | ENEMA | Freq: Once | RECTAL | Status: DC | PRN

## 2019-03-13 MED ORDER — DIPHENHYDRAMINE HCL 12.5 MG/5ML PO ELIX: 12.5000 mg | ORAL_SOLUTION | ORAL | Status: DC | PRN

## 2019-03-13 MED ORDER — DEXAMETHASONE SODIUM PHOSPHATE 10 MG/ML IJ SOLN
INTRAMUSCULAR | Status: AC
  Filled 2019-03-13: qty 1

## 2019-03-13 MED ORDER — LIP MEDEX EX OINT
TOPICAL_OINTMENT | CUTANEOUS | Status: AC
  Administered 2019-03-13: 16:00:00
  Filled 2019-03-13: qty 7

## 2019-03-13 MED ORDER — ONDANSETRON HCL 4 MG/2ML IJ SOLN
INTRAMUSCULAR | Status: AC
  Filled 2019-03-13: qty 2

## 2019-03-13 MED ORDER — ONDANSETRON HCL 4 MG PO TABS: 4.0000 mg | ORAL_TABLET | Freq: Four times a day (QID) | ORAL | Status: DC | PRN

## 2019-03-13 MED ORDER — MONTELUKAST SODIUM 10 MG PO TABS
10.0000 mg | ORAL_TABLET | Freq: Every day | ORAL | Status: DC
  Administered 2019-03-13: 10 mg via ORAL
  Filled 2019-03-13: qty 1

## 2019-03-13 MED ORDER — LORATADINE 10 MG PO TABS
10.0000 mg | ORAL_TABLET | Freq: Every day | ORAL | Status: DC
  Administered 2019-03-14: 10 mg via ORAL
  Filled 2019-03-13: qty 1

## 2019-03-13 MED ORDER — PROPOFOL 10 MG/ML IV BOLUS
INTRAVENOUS | Status: AC
  Filled 2019-03-13: qty 20

## 2019-03-13 MED ORDER — DEXAMETHASONE SODIUM PHOSPHATE 10 MG/ML IJ SOLN
10.0000 mg | Freq: Once | INTRAMUSCULAR | Status: AC
  Administered 2019-03-14: 10 mg via INTRAVENOUS
  Filled 2019-03-13: qty 1

## 2019-03-13 MED ORDER — FENTANYL CITRATE (PF) 100 MCG/2ML IJ SOLN
INTRAMUSCULAR | Status: DC | PRN
  Administered 2019-03-13: 100 ug via INTRAVENOUS

## 2019-03-13 MED ORDER — FLUTICASONE PROPIONATE 50 MCG/ACT NA SUSP
2.0000 | Freq: Every day | NASAL | Status: DC
  Filled 2019-03-13: qty 16

## 2019-03-13 MED ORDER — CEFAZOLIN SODIUM-DEXTROSE 2-4 GM/100ML-% IV SOLN
2.0000 g | Freq: Four times a day (QID) | INTRAVENOUS | Status: AC
  Administered 2019-03-13 – 2019-03-14 (×2): 2 g via INTRAVENOUS
  Filled 2019-03-13 (×2): qty 100

## 2019-03-13 MED ORDER — PROPOFOL 500 MG/50ML IV EMUL
INTRAVENOUS | Status: DC | PRN
  Administered 2019-03-13: 50 ug/kg/min via INTRAVENOUS

## 2019-03-13 MED ORDER — EPHEDRINE 5 MG/ML INJ
INTRAVENOUS | Status: AC
  Filled 2019-03-13: qty 10

## 2019-03-13 MED ORDER — FENTANYL CITRATE (PF) 100 MCG/2ML IJ SOLN: 25.0000 ug | INTRAMUSCULAR | Status: DC | PRN

## 2019-03-13 MED ORDER — METOCLOPRAMIDE HCL 5 MG/ML IJ SOLN: 5.0000 mg | Freq: Three times a day (TID) | INTRAMUSCULAR | Status: DC | PRN

## 2019-03-13 MED ORDER — ACETAMINOPHEN 10 MG/ML IV SOLN
1000.0000 mg | Freq: Four times a day (QID) | INTRAVENOUS | Status: DC
  Administered 2019-03-13: 14:00:00 1000 mg via INTRAVENOUS
  Filled 2019-03-13 (×2): qty 100

## 2019-03-13 MED ORDER — FENTANYL CITRATE (PF) 100 MCG/2ML IJ SOLN
INTRAMUSCULAR | Status: AC
  Filled 2019-03-13: qty 2

## 2019-03-13 MED ORDER — TRANEXAMIC ACID-NACL 1000-0.7 MG/100ML-% IV SOLN
1000.0000 mg | INTRAVENOUS | Status: AC
  Administered 2019-03-13: 1000 mg via INTRAVENOUS
  Filled 2019-03-13: qty 100

## 2019-03-13 MED ORDER — ONDANSETRON HCL 4 MG/2ML IJ SOLN
INTRAMUSCULAR | Status: DC | PRN
  Administered 2019-03-13: 4 mg via INTRAVENOUS

## 2019-03-13 MED ORDER — METOCLOPRAMIDE HCL 5 MG PO TABS: 5.0000 mg | ORAL_TABLET | Freq: Three times a day (TID) | ORAL | Status: DC | PRN

## 2019-03-13 MED ORDER — PHENOL 1.4 % MT LIQD: 1.0000 | OROMUCOSAL | Status: DC | PRN

## 2019-03-13 MED ORDER — HYDROCODONE-ACETAMINOPHEN 5-325 MG PO TABS
1.0000 | ORAL_TABLET | ORAL | Status: DC | PRN
  Administered 2019-03-13: 1 via ORAL
  Administered 2019-03-13: 2 via ORAL
  Administered 2019-03-13: 1 via ORAL
  Administered 2019-03-14: 2 via ORAL
  Filled 2019-03-13: qty 1
  Filled 2019-03-13: qty 2
  Filled 2019-03-13: qty 1
  Filled 2019-03-13: qty 2

## 2019-03-13 MED ORDER — AZELASTINE HCL 0.1 % NA SOLN: 1.0000 | Freq: Two times a day (BID) | NASAL | Status: DC | PRN

## 2019-03-13 MED ORDER — EPHEDRINE SULFATE-NACL 50-0.9 MG/10ML-% IV SOSY
PREFILLED_SYRINGE | INTRAVENOUS | Status: DC | PRN
  Administered 2019-03-13 (×2): 10 mg via INTRAVENOUS

## 2019-03-13 MED ORDER — BISACODYL 10 MG RE SUPP: 10.0000 mg | Freq: Every day | RECTAL | Status: DC | PRN

## 2019-03-13 MED ORDER — TRAMADOL HCL 50 MG PO TABS
50.0000 mg | ORAL_TABLET | Freq: Four times a day (QID) | ORAL | Status: DC | PRN
  Administered 2019-03-13 – 2019-03-14 (×3): 100 mg via ORAL
  Filled 2019-03-13 (×3): qty 2

## 2019-03-13 MED ORDER — MIDAZOLAM HCL 5 MG/5ML IJ SOLN
INTRAMUSCULAR | Status: DC | PRN
  Administered 2019-03-13: 2 mg via INTRAVENOUS

## 2019-03-13 MED ORDER — OXYCODONE HCL 5 MG/5ML PO SOLN: 5.0000 mg | Freq: Once | ORAL | Status: DC | PRN

## 2019-03-13 MED ORDER — VANCOMYCIN HCL 10 G IV SOLR: 1500.0000 mg | Freq: Once | INTRAVENOUS | Status: DC

## 2019-03-13 MED ORDER — 0.9 % SODIUM CHLORIDE (POUR BTL) OPTIME
TOPICAL | Status: DC | PRN
  Administered 2019-03-13: 1000 mL

## 2019-03-13 MED ORDER — POVIDONE-IODINE 10 % EX SWAB
2.0000 "application " | Freq: Once | CUTANEOUS | Status: AC
  Administered 2019-03-13: 2 via TOPICAL

## 2019-03-13 SURGICAL SUPPLY — 47 items
BAG DECANTER FOR FLEXI CONT (MISCELLANEOUS) ×1 IMPLANT
BAG SPEC THK2 15X12 ZIP CLS (MISCELLANEOUS)
BAG ZIPLOCK 12X15 (MISCELLANEOUS) IMPLANT
BLADE SAG 18X100X1.27 (BLADE) ×2 IMPLANT
COVER PERINEAL POST (MISCELLANEOUS) ×2 IMPLANT
COVER SURGICAL LIGHT HANDLE (MISCELLANEOUS) ×2 IMPLANT
COVER WAND RF STERILE (DRAPES) IMPLANT
CUP ACETBLR 54 OD PINNACLE (Hips) ×1 IMPLANT
DECANTER SPIKE VIAL GLASS SM (MISCELLANEOUS) ×2 IMPLANT
DRAPE STERI IOBAN 125X83 (DRAPES) ×2 IMPLANT
DRAPE U-SHAPE 47X51 STRL (DRAPES) ×4 IMPLANT
DRSG ADAPTIC 3X8 NADH LF (GAUZE/BANDAGES/DRESSINGS) ×2 IMPLANT
DRSG MEPILEX BORDER 4X4 (GAUZE/BANDAGES/DRESSINGS) ×2 IMPLANT
DRSG MEPILEX BORDER 4X8 (GAUZE/BANDAGES/DRESSINGS) ×2 IMPLANT
DURAPREP 26ML APPLICATOR (WOUND CARE) ×2 IMPLANT
ELECT REM PT RETURN 15FT ADLT (MISCELLANEOUS) ×2 IMPLANT
EVACUATOR 1/8 PVC DRAIN (DRAIN) ×2 IMPLANT
GLOVE BIO SURGEON STRL SZ 6 (GLOVE) IMPLANT
GLOVE BIO SURGEON STRL SZ7 (GLOVE) IMPLANT
GLOVE BIO SURGEON STRL SZ8 (GLOVE) ×2 IMPLANT
GLOVE BIOGEL PI IND STRL 6.5 (GLOVE) IMPLANT
GLOVE BIOGEL PI IND STRL 7.0 (GLOVE) IMPLANT
GLOVE BIOGEL PI IND STRL 8 (GLOVE) ×1 IMPLANT
GLOVE BIOGEL PI INDICATOR 6.5 (GLOVE)
GLOVE BIOGEL PI INDICATOR 7.0 (GLOVE)
GLOVE BIOGEL PI INDICATOR 8 (GLOVE) ×1
GOWN STRL REUS W/TWL LRG LVL3 (GOWN DISPOSABLE) ×2 IMPLANT
GOWN STRL REUS W/TWL XL LVL3 (GOWN DISPOSABLE) ×3 IMPLANT
HEAD M SROM 36MM PLUS 1.5 (Hips) IMPLANT
HOLDER FOLEY CATH W/STRAP (MISCELLANEOUS) ×2 IMPLANT
KIT TURNOVER KIT A (KITS) IMPLANT
LINER MARATHON NEUT +4X54X36 (Hips) ×1 IMPLANT
MANIFOLD NEPTUNE II (INSTRUMENTS) ×2 IMPLANT
PACK ANTERIOR HIP CUSTOM (KITS) ×2 IMPLANT
SROM M HEAD 36MM PLUS 1.5 (Hips) ×2 IMPLANT
STEM FEMORAL SZ6 HIGH ACTIS (Stem) ×1 IMPLANT
STRIP CLOSURE SKIN 1/2X4 (GAUZE/BANDAGES/DRESSINGS) ×2 IMPLANT
SUT ETHIBOND NAB CT1 #1 30IN (SUTURE) ×2 IMPLANT
SUT MNCRL AB 4-0 PS2 18 (SUTURE) ×2 IMPLANT
SUT STRATAFIX 0 PDS 27 VIOLET (SUTURE) ×2
SUT VIC AB 2-0 CT1 27 (SUTURE) ×4
SUT VIC AB 2-0 CT1 TAPERPNT 27 (SUTURE) ×2 IMPLANT
SUTURE STRATFX 0 PDS 27 VIOLET (SUTURE) ×1 IMPLANT
SYR 50ML LL SCALE MARK (SYRINGE) IMPLANT
TAPE STRIPS DRAPE STRL (GAUZE/BANDAGES/DRESSINGS) ×1 IMPLANT
TRAY FOLEY MTR SLVR 16FR STAT (SET/KITS/TRAYS/PACK) ×2 IMPLANT
YANKAUER SUCT BULB TIP 10FT TU (MISCELLANEOUS) ×2 IMPLANT

## 2019-03-13 NOTE — Anesthesia Preprocedure Evaluation (Signed)
Anesthesia Evaluation  Patient identified by MRN, date of birth, ID band Patient awake    Reviewed: Allergy & Precautions, H&P , NPO status , Patient's Chart, lab work & pertinent test results  Airway Mallampati: II   Neck ROM: full    Dental   Pulmonary neg pulmonary ROS,    breath sounds clear to auscultation       Cardiovascular hypertension,  Rhythm:regular Rate:Normal     Neuro/Psych PSYCHIATRIC DISORDERS Anxiety  Neuromuscular disease    GI/Hepatic hiatal hernia, GERD  ,  Endo/Other    Renal/GU      Musculoskeletal  (+) Arthritis ,   Abdominal   Peds  Hematology   Anesthesia Other Findings   Reproductive/Obstetrics                             Anesthesia Physical Anesthesia Plan  ASA: III  Anesthesia Plan: Spinal   Post-op Pain Management:    Induction: Intravenous  PONV Risk Score and Plan: 1 and Ondansetron, Propofol infusion and Treatment may vary due to age or medical condition  Airway Management Planned: Simple Face Mask  Additional Equipment:   Intra-op Plan:   Post-operative Plan:   Informed Consent: I have reviewed the patients History and Physical, chart, labs and discussed the procedure including the risks, benefits and alternatives for the proposed anesthesia with the patient or authorized representative who has indicated his/her understanding and acceptance.       Plan Discussed with: CRNA, Anesthesiologist and Surgeon  Anesthesia Plan Comments:         Anesthesia Quick Evaluation

## 2019-03-13 NOTE — Discharge Instructions (Signed)
°Dr. Frank Aluisio °Total Joint Specialist °[facility] °[address] °[place], [place] [address] °[phone] ° °ANTERIOR APPROACH TOTAL HIP REPLACEMENT POSTOPERATIVE DIRECTIONS ° ° °Hip Rehabilitation, Guidelines Following Surgery  °The results of a hip operation are greatly improved after range of motion and muscle strengthening exercises. Follow all safety measures which are given to protect your hip. If any of these exercises cause increased pain or swelling in your joint, decrease the amount until you are comfortable again. Then slowly increase the exercises. Call your caregiver if you have problems or questions.  ° °HOME CARE INSTRUCTIONS  °• Remove items at home which could result in a fall. This includes throw rugs or furniture in walking pathways.  °· ICE to the affected hip every three hours for 30 minutes at a time and then as needed for pain and swelling.  Continue to use ice on the hip for pain and swelling from surgery. You may notice swelling that will progress down to the foot and ankle.  This is normal after surgery.  Elevate the leg when you are not up walking on it.   °· Continue to use the breathing machine which will help keep your temperature down.  It is common for your temperature to cycle up and down following surgery, especially at night when you are not up moving around and exerting yourself.  The breathing machine keeps your lungs expanded and your temperature down. ° °DIET °You may resume your previous home diet once your are discharged from the hospital. ° °DRESSING / WOUND CARE / SHOWERING °You may shower 3 days after surgery, but keep the wounds dry during showering.  You may use an occlusive plastic wrap (Press'n Seal for example), NO SOAKING/SUBMERGING IN THE BATHTUB.  If the bandage gets wet, change with a clean dry gauze.  If the incision gets wet, pat the wound dry with a clean towel. °You may start showering once you are discharged home but do not submerge the  incision under water. Just pat the incision dry and apply a dry gauze dressing on daily. °Change the surgical dressing daily and reapply a dry dressing each time. ° °ACTIVITY °Walk with your walker as instructed. °Use walker as long as suggested by your caregivers. °Avoid periods of inactivity such as sitting longer than an hour when not asleep. This helps prevent blood clots.  °You may resume a sexual relationship in one month or when given the OK by your doctor.  °You may return to work once you are cleared by your doctor.  °Do not drive a car for 6 weeks or until released by you surgeon.  °Do not drive while taking narcotics. ° °WEIGHT BEARING °Weight bearing as tolerated with assist device (walker, cane, etc) as directed, use it as long as suggested by your surgeon or therapist, typically at least 4-6 weeks. ° °POSTOPERATIVE CONSTIPATION PROTOCOL °Constipation - defined medically as fewer than three stools per week and severe constipation as less than one stool per week. ° °One of the most common issues patients have following surgery is constipation.  Even if you have a regular bowel pattern at home, your normal regimen is likely to be disrupted due to multiple reasons following surgery.  Combination of anesthesia, postoperative narcotics, change in appetite and fluid intake all can affect your bowels.  In order to avoid complications following surgery, here are some recommendations in order to help you during your recovery period. ° °Colace (docusate) - Pick up an over-the-counter form   of Colace or another stool softener and take twice a day as long as you are requiring postoperative pain medications.  Take with a full glass of water daily.  If you experience loose stools or diarrhea, hold the colace until you stool forms back up.  If your symptoms do not get better within 1 week or if they get worse, check with your doctor. ° °Dulcolax (bisacodyl) - Pick up over-the-counter and take as directed by the product  packaging as needed to assist with the movement of your bowels.  Take with a full glass of water.  Use this product as needed if not relieved by Colace only.  ° °MiraLax (polyethylene glycol) - Pick up over-the-counter to have on hand.  MiraLax is a solution that will increase the amount of water in your bowels to assist with bowel movements.  Take as directed and can mix with a glass of water, juice, soda, coffee, or tea.  Take if you go more than two days without a movement. °Do not use MiraLax more than once per day. Call your doctor if you are still constipated or irregular after using this medication for 7 days in a row. ° °If you continue to have problems with postoperative constipation, please contact the office for further assistance and recommendations.  If you experience "the worst abdominal pain ever" or develop nausea or vomiting, please contact the office immediatly for further recommendations for treatment. ° °ITCHING ° If you experience itching with your medications, try taking only a single pain pill, or even half a pain pill at a time.  You can also use Benadryl over the counter for itching or also to help with sleep.  ° °TED HOSE STOCKINGS °Wear the elastic stockings on both legs for three weeks following surgery during the day but you may remove then at night for sleeping. ° °MEDICATIONS °See your medication summary on the “After Visit Summary” that the nursing staff will review with you prior to discharge.  You may have some home medications which will be placed on hold until you complete the course of blood thinner medication.  It is important for you to complete the blood thinner medication as prescribed by your surgeon.  Continue your approved medications as instructed at time of discharge. ° °PRECAUTIONS °If you experience chest pain or shortness of breath - call 911 immediately for transfer to the hospital emergency department.  °If you develop a fever greater that 101 F, purulent drainage  from wound, increased redness or drainage from wound, foul odor from the wound/dressing, or calf pain - CONTACT YOUR SURGEON.   °                                                °FOLLOW-UP APPOINTMENTS °Make sure you keep all of your appointments after your operation with your surgeon and caregivers. You should call the office at the above phone number and make an appointment for approximately two weeks after the date of your surgery or on the date instructed by your surgeon outlined in the "After Visit Summary". ° °RANGE OF MOTION AND STRENGTHENING EXERCISES  °These exercises are designed to help you keep full movement of your hip joint. Follow your caregiver's or physical therapist's instructions. Perform all exercises about fifteen times, three times per day or as directed. Exercise both hips, even if you have   had only one joint replacement. These exercises can be done on a training (exercise) mat, on the floor, on a table or on a bed. Use whatever works the best and is most comfortable for you. Use music or television while you are exercising so that the exercises are a pleasant break in your day. This will make your life better with the exercises acting as a break in routine you can look forward to.  °• Lying on your back, slowly slide your foot toward your buttocks, raising your knee up off the floor. Then slowly slide your foot back down until your leg is straight again.  °• Lying on your back spread your legs as far apart as you can without causing discomfort.  °• Lying on your side, raise your upper leg and foot straight up from the floor as far as is comfortable. Slowly lower the leg and repeat.  °• Lying on your back, tighten up the muscle in the front of your thigh (quadriceps muscles). You can do this by keeping your leg straight and trying to raise your heel off the floor. This helps strengthen the largest muscle supporting your knee.  °• Lying on your back, tighten up the muscles of your buttocks both  with the legs straight and with the knee bent at a comfortable angle while keeping your heel on the floor.  ° °IF YOU ARE TRANSFERRED TO A SKILLED REHAB FACILITY °If the patient is transferred to a skilled rehab facility following release from the hospital, a list of the current medications will be sent to the facility for the patient to continue.  When discharged from the skilled rehab facility, please have the facility set up the patient's Home Health Physical Therapy prior to being released. Also, the skilled facility will be responsible for providing the patient with their medications at time of release from the facility to include their pain medication, the muscle relaxants, and their blood thinner medication. If the patient is still at the rehab facility at time of the two week follow up appointment, the skilled rehab facility will also need to assist the patient in arranging follow up appointment in our office and any transportation needs. ° °MAKE SURE YOU:  °• Understand these instructions.  °• Get help right away if you are not doing well or get worse.  ° ° °Pick up stool softner and laxative for home use following surgery while on pain medications. °Do not submerge incision under water. °Please use good hand washing techniques while changing dressing each day. °May shower starting three days after surgery. °Please use a clean towel to pat the incision dry following showers. °Continue to use ice for pain and swelling after surgery. °Do not use any lotions or creams on the incision until instructed by your surgeon. ° °

## 2019-03-13 NOTE — H&P (Signed)
TOTAL HIP ADMISSION H&P  Patient is admitted for right total hip arthroplasty.  Subjective:  Chief Complaint: right hip pain  HPI: Isaiah Wilson, 84 y.o. male, has a history of pain and functional disability in the right hip(s) due to arthritis and patient has failed non-surgical conservative treatments for greater than 12 weeks to include NSAID's and/or analgesics and activity modification.  Onset of symptoms was gradual starting 1 years ago with gradually worsening course since that time.The patient noted no past surgery on the right hip(s).  Patient currently rates pain in the right hip at 9 out of 10 with activity. Patient has worsening of pain with activity and weight bearing, pain that interfers with activities of daily living and pain with passive range of motion. Patient has evidence of bone on bone osteoarthritis with subchondral cystis formation and early signs of flattening of the femoral head by imaging studies. This condition presents safety issues increasing the risk of falls.  There is no current active infection.  Patient Active Problem List   Diagnosis Date Noted  . OA (osteoarthritis) of hip 04/18/2018  . Acute suppurative otitis media of left ear without spontaneous rupture of tympanic membrane 03/06/2018  . Aftercare 12/15/2017  . Acute serous otitis media of left ear 06/20/2017  . Conductive hearing loss of both ears 06/20/2017  . History of total bilateral knee replacement 06/18/2017  . History of total knee replacement, right 06/13/2017  . Pain in left knee 05/31/2017  . OA (osteoarthritis) of knee 12/07/2016  . 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 Eustachian tube dysfunction, bilateral 07/15/2015  . Suppurative otitis media of right ear without rupture of tympanic membrane 07/15/2015  . Obesity (BMI 30-39.9) 02/17/2014  . Seborrheic keratoses 02/08/2013  . Paresthesia 02/09/2011  . OVERWEIGHT 07/05/2008   . HIATAL HERNIA 09/25/2007  . BPH (benign prostatic hypertrophy) 07/14/2007  . ISCHEMIC COLITIS 07/12/2007  . HYPERLIPIDEMIA 05/07/2007  . ANXIETY 05/07/2007  . NEUROPATHY 05/07/2007  . ALLERGIC RHINITIS 05/07/2007  . GERD 05/07/2007  . BARRETTS ESOPHAGUS 05/07/2007  . Osteoarthritis 05/07/2007   Past Medical History:  Diagnosis Date  . Abnormal EKG    hx left anterior fasicular block on old ekg 2017  . Allergic rhinitis   . Allergy   . Barrett's esophagus   . Benign prostatic hypertrophy   . Colitis, ischemic (HCC)    X 2  . Complication of anesthesia    SLOW TO AWAKEN AFTER 1 SURGERY 20  YRS AGO  . DJD (degenerative joint disease)    SPINE, AND OA  . GERD (gastroesophageal reflux disease)   . H/O hiatal hernia   . HOH (hard of hearing)    BOTH EARS  . HOH (hard of hearing)    BOTH EARS  . Hyperlipidemia    no medication per pt.  . Hypertension    pt denies says cardura for bladder  . Neuropathy     LEFT HAND AND ARM NUMB ALL THE TIME  . Overweight(278.02)     Past Surgical History:  Procedure Laterality Date  . 2 neck surgeries  2005-2006   Isaiah Wilson, Isaiah Wilson,Isaiah Wilson  . APPENDECTOMY    . EYE SURGERY     bilateral cataract surgerywith lens implant  . FOOT SURGERY     right x 3  . LAPAROSCOPIC CHOLECYSTECTOMY  1993   Dr. Luan MooreP. Young  . left total knee replacement  07/2007  Dr. Eulah Pont   . right total knee replacement  2012   Dr. Despina Hick  . SEPTOPLASTY  1975, May 2013   x3  . TONSILLECTOMY  as child  . TOTAL HIP ARTHROPLASTY Left 04/18/2018   Procedure: LEFT TOTAL HIP ARTHROPLASTY ANTERIOR APPROACH;  Surgeon: Ollen Gross, Isaiah Wilson;  Location: [facility];  Service: Orthopedics;  Laterality: Left;  . TOTAL KNEE REVISION Left 11/11/2015   Procedure: LEFT KNEE POLYETHELENE REVISION;  Surgeon: Ollen Gross, Isaiah Wilson;  Location: [facility];  Service: Orthopedics;  Laterality: Left;  . TOTAL KNEE REVISION Left 12/07/2016   Procedure:  LEFT TOTAL KNEE REVISION;  Surgeon: Ollen Gross, Isaiah Wilson;  Location: [facility];  Service: Orthopedics;  Laterality: Left;  Adductor Block    Current Facility-Administered Medications  Medication Dose Route Frequency Provider Last Rate Last Dose  . acetaminophen (OFIRMEV) IV 1,000 mg  1,000 mg Intravenous Q6H Isaiah Wilson, Amber, Isaiah Wilson      . ceFAZolin (ANCEF) IVPB 2g/100 mL premix  2 g Intravenous On Call to OR Isaiah Wilson, Isaiah Fellers, Isaiah Wilson      . chlorhexidine (HIBICLENS) 4 % liquid 4 application  60 mL Topical Once [facility], [facility], Isaiah Wilson      . chlorhexidine (HIBICLENS) 4 % liquid 4 application  60 mL Topical Once [facility], [facility], Isaiah Wilson      . dexamethasone (DECADRON) injection 8 mg  8 mg Intravenous Once [facility], [facility], Isaiah Wilson      . lactated ringers infusion   Intravenous Continuous Isaiah Wilson, Amber, Isaiah Wilson      . povidone-iodine 10 % swab 2 application  2 application Topical Once [facility], [facility], Isaiah Wilson      . tranexamic acid (CYKLOKAPRON) IVPB 1,000 mg  1,000 mg Intravenous To OR Isaiah Wilson, Amber, Isaiah Wilson      . vancomycin (VANCOCIN) 1,500 mg in sodium chloride 0.9 % 500 mL IVPB  1,500 mg Intravenous 60 min Pre-Op Isaiah Wilson, Isaiah Wilson, Isaiah Wilson       No Known Allergies  Social History   Tobacco Use  . Smoking status: Never Smoker  . Smokeless tobacco: Never Used  Substance Use Topics  . Alcohol use: No    Alcohol/week: 0.0 standard drinks    Family History  Problem Relation Age of Onset  . Leukemia Mother   . Heart disease Father   . Heart disease Sister   . Colon cancer Neg Hx   . Stomach cancer Neg Hx   . Rectal cancer Neg Hx   . Esophageal cancer Neg Hx      Review of Systems  Constitutional: Negative for chills and fever.  Respiratory: Negative for cough and shortness of breath.   Cardiovascular: Positive for chest pain. Negative for palpitations.  Gastrointestinal: Negative for nausea and vomiting.  Musculoskeletal: Positive for joint pain.    Objective:  Physical Exam No tenderness to  palpation about the right greater trochanteric bursa. No tenderness to palpation about the left greater trochanteric bursa. No masses or tumors palpated about the hips. Pain with passive and active motion of the right hip. ROM right hip 100 degrees flexion, 10 degrees internal rotation, 15 degrees external rotation, and 20 degrees abduction. ROM left hip 130 degrees flexion, 30 degrees internal rotation, 40 degrees external rotation, and 40 degrees abduction. Motion of the right hip causes pain into the right knee. No effusion or tenderness to palpation about the knees.  Vital signs in last 24 hours:   Labs   Estimated body mass index is 32.3 kg/m as calculated from the following:   Height  as of 03/07/19: 5\' 10"  (1.778 m).   Weight as of 03/07/19: 102.1 kg.   Imaging Review Plain radiographs demonstrate severe degenerative joint disease of the right hip(s). The bone quality appears to be adequate for age and reported activity level.  Assessment/Plan:  End stage arthritis, right hip(s)  The patient history, physical examination, clinical judgement of the provider and imaging studies are consistent with end stage degenerative joint disease of the right hip(s) and total hip arthroplasty is deemed medically necessary. The treatment options including medical management, injection therapy, arthroscopy and arthroplasty were discussed at length. The risks and benefits of total hip arthroplasty were presented and reviewed. The risks due to aseptic loosening, infection, stiffness, dislocation/subluxation,  thromboembolic complications and other imponderables were discussed.  The patient acknowledged the explanation, agreed to proceed with the plan and consent was signed. Patient is being admitted for inpatient treatment for surgery, pain control, PT, OT, prophylactic antibiotics, VTE prophylaxis, progressive ambulation and ADL's and discharge planning.The patient is planning to be discharged home.   Therapy  Plans: HHPT Disposition: Home with wife Planned DVT Prophylaxis: Aspirin 325 mg BID DME needed: 3-n-1, walker PCP: Dr. Carolann Littler TXA: IV Allergies: NKDA Anesthesia Concerns: None Last HgbA1c: 5.7%  - Patient was instructed on what medications to stop prior to surgery. - Follow-up visit in 2 weeks with Dr. Wynelle Link - Begin physical therapy following surgery - Pre-operative lab work as pre-surgical testing - Prescriptions will be provided in hospital at time of discharge  Griffith Citron, Isaiah Wilson [facility] [place] [phone]

## 2019-03-13 NOTE — Transfer of Care (Signed)
Immediate Anesthesia Transfer of Care Note  Patient: Isaiah Wilson  Procedure(s) Performed: TOTAL HIP ARTHROPLASTY ANTERIOR APPROACH (Right Hip)  Patient Location: PACU  Anesthesia Type:Spinal  Level of Consciousness: awake and alert   Airway & Oxygen Therapy: Patient Spontanous Breathing and Patient connected to face mask oxygen  Post-op Assessment: Report given to RN and Post -op Vital signs reviewed and stable  Post vital signs: Reviewed and stable  Last Vitals:  Vitals Value Taken Time  BP 156/78 03/13/19 1516  Temp    Pulse 52 03/13/19 1520  Resp 13 03/13/19 1520  SpO2 100 % 03/13/19 1520  Vitals shown include unvalidated device data.  Last Pain:  Vitals:   03/13/19 1244  TempSrc: Oral  PainSc:       Patients Stated Pain Goal: 3 (81/15/72 6203)  Complications: No apparent anesthesia complications

## 2019-03-13 NOTE — Anesthesia Procedure Notes (Signed)
Spinal  Patient location during procedure: OR Start time: 03/13/2019 1:32 PM End time: 03/13/2019 1:35 PM Staffing Anesthesiologist: Albertha Ghee, MD Performed: anesthesiologist  Preanesthetic Checklist Completed: patient identified, surgical consent, pre-op evaluation, timeout performed, IV checked, risks and benefits discussed and monitors and equipment checked Spinal Block Patient position: sitting Prep: DuraPrep Patient monitoring: cardiac monitor, continuous pulse ox and blood pressure Approach: midline Location: L3-4 Injection technique: single-shot Needle Needle type: Pencan  Needle gauge: 24 G Needle length: 9 cm Assessment Sensory level: T10 Additional Notes Functioning IV was confirmed and monitors were applied. Sterile prep and drape, including hand hygiene and sterile gloves were used. The patient was positioned and the spine was prepped. The skin was anesthetized with lidocaine.  Free flow of clear CSF was obtained prior to injecting local anesthetic into the CSF.  The spinal needle aspirated freely following injection.  The needle was carefully withdrawn.  The patient tolerated the procedure well.

## 2019-03-13 NOTE — Op Note (Signed)
OPERATIVE REPORT- TOTAL HIP ARTHROPLASTY   PREOPERATIVE DIAGNOSIS: Osteoarthritis of the Right hip.   POSTOPERATIVE DIAGNOSIS: Osteoarthritis of the Right  hip.   PROCEDURE: Right total hip arthroplasty, anterior approach.   SURGEON: Gaynelle Arabian, MD   ASSISTANT: Griffith Citron, PA-C  ANESTHESIA:  Spinal  ESTIMATED BLOOD LOSS:-350 mL    DRAINS: Hemovac x1.   COMPLICATIONS: None   CONDITION: PACU - hemodynamically stable.   BRIEF CLINICAL NOTE: Isaiah Wilson is a 84 y.o. male who has advanced end-  stage arthritis of their Right  hip with progressively worsening pain and  dysfunction.The patient has failed nonoperative management and presents for  total hip arthroplasty.   PROCEDURE IN DETAIL: After successful administration of spinal  anesthetic, the traction boots for the [facility] bed were placed on both  feet and the patient was placed onto the [facility] bed, boots placed into the leg  holders. The Right hip was then isolated from the perineum with plastic  drapes and prepped and draped in the usual sterile fashion. ASIS and  greater trochanter were marked and a oblique incision was made, starting  at about 1 cm lateral and 2 cm distal to the ASIS and coursing towards  the anterior cortex of the femur. The skin was cut with a 10 blade  through subcutaneous tissue to the level of the fascia overlying the  tensor fascia lata muscle. The fascia was then incised in line with the  incision at the junction of the anterior third and posterior 2/3rd. The  muscle was teased off the fascia and then the interval between the TFL  and the rectus was developed. The Hohmann retractor was then placed at  the top of the femoral neck over the capsule. The vessels overlying the  capsule were cauterized and the fat on top of the capsule was removed.  A Hohmann retractor was then placed anterior underneath the rectus  femoris to give exposure to the entire anterior capsule. A T-shaped   capsulotomy was performed. The edges were tagged and the femoral head  was identified.       Osteophytes are removed off the superior acetabulum.  The femoral neck was then cut in situ with an oscillating saw. Traction  was then applied to the left lower extremity utilizing the [facility]  traction. The femoral head was then removed. Retractors were placed  around the acetabulum and then circumferential removal of the labrum was  performed. Osteophytes were also removed. Reaming starts at 49 mm to  medialize and  Increased in 2 mm increments to 53 mm. We reamed in  approximately 40 degrees of abduction, 20 degrees anteversion. A 54 mm  pinnacle acetabular shell was then impacted in anatomic position under  fluoroscopic guidance with excellent purchase. We did not need to place  any additional dome screws. A 36 mm neutral + 4 marathon liner was then  placed into the acetabular shell.       The femoral lift was then placed along the lateral aspect of the femur  just distal to the vastus ridge. The leg was  externally rotated and capsule  was stripped off the inferior aspect of the femoral neck down to the  level of the lesser trochanter, this was done with electrocautery. The femur was lifted after this was performed. The  leg was then placed in an extended and adducted position essentially delivering the femur. We also removed the capsule superiorly and the piriformis from the piriformis  fossa to gain excellent exposure of the  proximal femur. Rongeur was used to remove some cancellous bone to get  into the lateral portion of the proximal femur for placement of the  initial starter reamer. The starter broaches was placed  the starter broach  and was shown to go down the center of the canal. Broaching  with the Actis system was then performed starting at size 0  coursing  Up to size 6. A size 6 had excellent torsional and rotational  and axial stability. The trial high offset neck was then placed   with a 36 + 1.5 trial head. The hip was then reduced. We confirmed that  the stem was in the canal both on AP and lateral x-rays. It also has excellent sizing. The hip was reduced with outstanding stability through full extension and full external rotation.. AP pelvis was taken and the leg lengths were measured and found to be equal. Hip was then dislocated again and the femoral head and neck removed. The  femoral broach was removed. Size 6 Actis stem with a high offset  neck was then impacted into the femur following native anteversion. Has  excellent purchase in the canal. Excellent torsional and rotational and  axial stability. It is confirmed to be in the canal on AP and lateral  fluoroscopic views. The 36 + 1.5 metal head was placed and the hip  reduced with outstanding stability. Again AP pelvis was taken and it  confirmed that the leg lengths were equal. The wound was then copiously  irrigated with saline solution and the capsule reattached and repaired  with Ethibond suture. 30 ml of .25% Bupivicaine was  injected into the capsule and into the edge of the tensor fascia lata as well as subcutaneous tissue. The fascia overlying the tensor fascia lata was then closed with a running #1 V-Loc. Subcu was closed with interrupted 2-0 Vicryl and subcuticular running 4-0 Monocryl. Incision was cleaned  and dried. Steri-Strips and a bulky sterile dressing applied. Hemovac  drain was hooked to suction and then the patient was awakened and transported to  recovery in stable condition.        Please note that a surgical assistant was a medical necessity for this procedure to perform it in a safe and expeditious manner. Assistant was necessary to provide appropriate retraction of vital neurovascular structures and to prevent femoral fracture and allow for anatomic placement of the prosthesis.  Gaynelle Arabian, M.D.

## 2019-03-13 NOTE — Anesthesia Procedure Notes (Signed)
Date/Time: 03/13/2019 1:29 PM Performed by: Sharlette Dense, CRNA Oxygen Delivery Method: Simple face mask

## 2019-03-13 NOTE — Interval H&P Note (Signed)
History and Physical Interval Note:  03/13/2019 1:23 PM  Isaiah Wilson  has presented today for surgery, with the diagnosis of right hip osteoarthritis.  The various methods of treatment have been discussed with the patient and family. After consideration of risks, benefits and other options for treatment, the patient has consented to  Procedure(s) with comments: [place] (Right) - 189min as a surgical intervention.  The patient's history has been reviewed, patient examined, no change in status, stable for surgery.  I have reviewed the patient's chart and labs.  Questions were answered to the patient's satisfaction.     Pilar Plate Kipper Buch

## 2019-03-14 ENCOUNTER — Encounter (HOSPITAL_COMMUNITY): Payer: Self-pay | Admitting: Orthopedic Surgery

## 2019-03-14 LAB — CBC
HCT: 36.4 % — ABNORMAL LOW (ref 39.0–52.0)
Hemoglobin: 11.8 g/dL — ABNORMAL LOW (ref 13.0–17.0)
MCH: 29.2 pg (ref 26.0–34.0)
MCHC: 32.4 g/dL (ref 30.0–36.0)
MCV: 90.1 fL (ref 80.0–100.0)
Platelets: 198 10*3/uL (ref 150–400)
RBC: 4.04 MIL/uL — ABNORMAL LOW (ref 4.22–5.81)
RDW: 14.1 % (ref 11.5–15.5)
WBC: 12.1 10*3/uL — ABNORMAL HIGH (ref 4.0–10.5)
nRBC: 0 % (ref 0.0–0.2)

## 2019-03-14 LAB — BASIC METABOLIC PANEL
Anion gap: 10 (ref 5–15)
BUN: 17 mg/dL (ref 8–23)
CO2: 23 mmol/L (ref 22–32)
Calcium: 8.7 mg/dL — ABNORMAL LOW (ref 8.9–10.3)
Chloride: 103 mmol/L (ref 98–111)
Creatinine, Ser: 0.84 mg/dL (ref 0.61–1.24)
GFR calc Af Amer: >60 mL/min (ref >60)
GFR calc non Af Amer: >60 mL/min (ref >60)
Glucose, Bld: 156 mg/dL — ABNORMAL HIGH (ref 70–99)
Potassium: 3.9 mmol/L (ref 3.5–5.1)
Sodium: 136 mmol/L (ref 135–145)

## 2019-03-14 MED ORDER — TRAMADOL HCL 50 MG PO TABS: 50.0000 mg | ORAL_TABLET | Freq: Four times a day (QID) | ORAL | 0 refills | Status: DC | PRN

## 2019-03-14 MED ORDER — ASPIRIN 325 MG PO TBEC: 325.0000 mg | DELAYED_RELEASE_TABLET | Freq: Two times a day (BID) | ORAL | 0 refills | Status: DC

## 2019-03-14 MED ORDER — HYDROMORPHONE HCL 1 MG/ML IJ SOLN
1.0000 mg | Freq: Once | INTRAMUSCULAR | Status: AC
  Administered 2019-03-14: 1 mg via INTRAVENOUS
  Filled 2019-03-14: qty 1

## 2019-03-14 MED ORDER — OXYCODONE HCL 5 MG PO TABS
10.0000 mg | ORAL_TABLET | ORAL | Status: DC | PRN
  Administered 2019-03-14 (×2): 10 mg via ORAL
  Filled 2019-03-14 (×2): qty 2

## 2019-03-14 MED ORDER — OXYCODONE HCL 5 MG PO TABS: 5.0000 mg | ORAL_TABLET | ORAL | Status: DC | PRN

## 2019-03-14 MED ORDER — OXYCODONE HCL 5 MG PO TABS: 5.0000 mg | ORAL_TABLET | Freq: Four times a day (QID) | ORAL | 0 refills | Status: DC | PRN

## 2019-03-14 MED ORDER — METHOCARBAMOL 500 MG PO TABS: 500.0000 mg | ORAL_TABLET | Freq: Four times a day (QID) | ORAL | 0 refills | Status: DC | PRN

## 2019-03-14 NOTE — Progress Notes (Signed)
   Subjective: 1 Day Post-Op Procedure(s) (LRB): TOTAL HIP ARTHROPLASTY ANTERIOR APPROACH (Right) Patient reports pain as moderate.   Patient seen in rounds with Dr. Wynelle Link. Patient is well, and has had no acute complaints or problems other than pain in the right hip. Reports that he has had minimal relief with hydrocodone. No SOB or chest pain. Voiding well. Positive flatus.  Plan is to go Home after hospital stay.  Objective: Vital signs in last 24 hours: Temp:  [97.4 F (36.3 C)-98.2 F (36.8 C)] 97.8 F (36.6 C) (10/15 0622) Pulse Rate:  [54-77] 63 (10/15 0622) Resp:  [14-29] 14 (10/15 0622) BP: (136-171)/(74-93) 154/75 (10/15 0622) SpO2:  [98 %-100 %] 100 % (10/15 0622) Weight:  [102.1 kg] 102.1 kg (10/14 1237)  Intake/Output from previous day:  Intake/Output Summary (Last 24 hours) at 03/14/2019 0730 Last data filed at 03/14/2019 0622 Gross per 24 hour  Intake 3480.5 ml  Output 3130 ml  Net 350.5 ml     Labs: Recent Labs    03/14/19 0213  HGB 11.8*   Recent Labs    03/14/19 0213  WBC 12.1*  RBC 4.04*  HCT 36.4*  PLT 198   Recent Labs    03/14/19 0213  NA 136  K 3.9  CL 103  CO2 23  BUN 17  CREATININE 0.84  GLUCOSE 156*  CALCIUM 8.7*    EXAM General - Patient is Alert and Oriented Extremity - Neurologically intact Intact pulses distally Dorsiflexion/Plantar flexion intact No cellulitis present Compartment soft Dressing - dressing C/D/I Motor Function - intact, moving foot and toes well on exam.  Hemovac pulled without difficulty.  Past Medical History:  Diagnosis Date  . Abnormal EKG    hx left anterior fasicular block on old ekg 2017  . Allergic rhinitis   . Allergy   . Barrett's esophagus   . Benign prostatic hypertrophy   . Colitis, ischemic ([place])    X 2  . Complication of anesthesia    SLOW TO AWAKEN AFTER 1 SURGERY 20  YRS AGO  . DJD (degenerative joint disease)    SPINE, AND OA  . GERD (gastroesophageal reflux disease)    . H/O hiatal hernia   . HOH (hard of hearing)    BOTH EARS  . HOH (hard of hearing)    BOTH EARS  . Hyperlipidemia    no medication per pt.  . Hypertension    pt denies says cardura for bladder  . Neuropathy     LEFT HAND AND ARM NUMB ALL THE TIME  . Overweight(278.02)     Assessment/Plan: 1 Day Post-Op Procedure(s) (LRB): TOTAL HIP ARTHROPLASTY ANTERIOR APPROACH (Right) Active Problems:   OA (osteoarthritis) of hip  Estimated body mass index is 32.3 kg/m as calculated from the following:   Height as of this encounter: 5\' 10"  (1.778 m).   Weight as of this encounter: 102.1 kg. Advance diet Up with therapy D/C IV fluids when tolerating POs well  DVT Prophylaxis - Aspirin Weight Bearing As Tolerated Hemovac Pulled   Will adjust pain medications and switch him to Oxy. Start PT today. If patient is progressing well and meeting goals, DC home this afternoon. Discharge instructions given. Follow up in 2 weeks.   Ardeen Jourdain, PA-C Orthopaedic Surgery 03/14/2019, 7:30 AM

## 2019-03-14 NOTE — Progress Notes (Signed)
Patient discharged to home w/ family. Given all belongings, instructions, equipment. Verbalized understanding of all instructions. Escorted to pov via w/c. 

## 2019-03-14 NOTE — Progress Notes (Signed)
Physical Therapy Treatment Patient Details Name: Isaiah Wilson MRN: [id number] DOB: [birth year] Today's Date: 03/14/2019    History of Present Illness Pt s/p R THR and with hx of Bil TKR and L THR (11/19)    PT Comments    Pt progressing well with mobility but requiring cues to slow down.  Pt reviewed car transfers, stairs and home therex program with progression - written instruction provided and reviewed.   Follow Up Recommendations  Follow surgeon's recommendation for DC plan and follow-up therapies     Equipment Recommendations  Rolling walker with 5" wheels    Recommendations for Other Services       Precautions / Restrictions Precautions Precautions: Fall Restrictions Weight Bearing Restrictions: No Other Position/Activity Restrictions: WBAT    Mobility  Bed Mobility Overal bed mobility: Needs Assistance Bed Mobility: Sit to Supine     Supine to sit: Min assist Sit to supine: Supervision   General bed mobility comments: cues for sequence and use of L LE to self assist  Transfers Overall transfer level: Needs assistance Equipment used: Rolling walker (2 wheeled) Transfers: Sit to/from Stand Sit to Stand: Min guard;Supervision         General transfer comment: Pt self-cues for LE management and use of UEs to self assist  Ambulation/Gait Ambulation/Gait assistance: Min guard;Supervision Gait Distance (Feet): 250 Feet Assistive device: Rolling walker (2 wheeled) Gait Pattern/deviations: Step-to pattern;Step-through pattern;Decreased step length - right;Decreased step length - left;Shuffle;Trunk flexed Gait velocity: decr   General Gait Details: cues for posture, position from RW and initial sequence   Stairs Stairs: Yes Stairs assistance: Min guard Stair Management: No rails;Step to pattern;Forwards;With walker Number of Stairs: 2 General stair comments: single step twice with cues for sequence   Wheelchair Mobility    Modified Rankin  (Stroke Patients Only)       Balance Overall balance assessment: Mild deficits observed, not formally tested                                          Cognition Arousal/Alertness: Awake/alert Behavior During Therapy: WFL for tasks assessed/performed Overall Cognitive Status: Within Functional Limits for tasks assessed                                        Exercises Total Joint Exercises Ankle Circles/Pumps: AROM;Both;15 reps;Supine Quad Sets: AROM;Both;10 reps;Supine Heel Slides: AAROM;Right;Supine;10 reps Hip ABduction/ADduction: AAROM;Right;Supine;10 reps    General Comments        Pertinent Vitals/Pain Pain Assessment: 0-10 Pain Score: 4  Pain Location: R hip Pain Descriptors / Indicators: Aching;Sore Pain Intervention(s): Limited activity within patient's tolerance;Monitored during session;Premedicated before session    [place] expects to be discharged to:: Private residence Living Arrangements: Spouse/significant other Available Help at Discharge: Family Type of Home: House Home Access: Stairs to enter   Home Layout: One level Home Equipment: Environmental consultant - 2 wheels;Shower seat;Adaptive equipment;Bedside commode;Cane - single point(lift chair) Additional Comments: Pt states walker in poor condition; Pt states he cares for spouse but his sister will be there to assist during his recovery    Prior Function Level of Independence: Independent with assistive device(s)      Comments: RW as needed 2* hip pain   PT Goals (current goals can now be found in the care  plan section) Acute Rehab PT Goals Patient Stated Goal: Regain IND to care for spouse PT Goal Formulation: With patient Time For Goal Achievement: 03/21/19 Potential to Achieve Goals: Good Progress towards PT goals: Progressing toward goals    Frequency    7X/week      PT Plan Current plan remains appropriate    Co-evaluation               AM-PAC PT "6 Clicks" Mobility   Outcome Measure  Help needed turning from your back to your side while in a flat bed without using bedrails?: A Little Help needed moving from lying on your back to sitting on the side of a flat bed without using bedrails?: A Little Help needed moving to and from a bed to a chair (including a wheelchair)?: A Little Help needed standing up from a chair using your arms (e.g., wheelchair or bedside chair)?: A Little Help needed to walk in hospital room?: A Little Help needed climbing 3-5 steps with a railing? : A Little 6 Click Score: 18    End of Session Equipment Utilized During Treatment: Gait belt Activity Tolerance: Patient tolerated treatment well Patient left: with call bell/phone within reach;in bed Nurse Communication: Mobility status PT Visit Diagnosis: Difficulty in walking, not elsewhere classified (R26.2)     Time: 2094-7096 PT Time Calculation (min) (ACUTE ONLY): 30 min  Charges:  $Gait Training: 8-22 mins $Therapeutic Exercise: 8-22 mins                     Mauro Kaufmann PT Acute Rehabilitation Services Pager [phone] Office [phone]    Nimesh Riolo 03/14/2019, 2:40 PM

## 2019-03-14 NOTE — TOC Transition Note (Signed)
Transition of Care [facility]) - CM/SW Discharge Note   Patient Details  Name: Isaiah Wilson MRN: [id number] Date of Birth: 1934-08-03  Transition of Care [facility]) CM/SW Contact:  Leeroy Cha, RN Phone Number: 03/14/2019, 1:04 PM   Clinical Narrative:     Rolling walker per orders sent to [employer] to complete.        Patient Goals and CMS Choice        Discharge Placement                       Discharge Plan and Services                DME Arranged: Walker rolling DME Agency: [employer] Date DME Agency Contacted: 03/14/19 Time DME Agency Contacted: [id number] Representative spoke with at DME Agency: [email or web address]            Social Determinants of Health ([place]) Interventions     Readmission Risk Interventions No flowsheet data found.

## 2019-03-14 NOTE — Evaluation (Signed)
Physical Therapy Evaluation Patient Details Name: Isaiah Wilson MRN: [id number] DOB: [birth year] Today's Date: 03/14/2019   History of Present Illness  Pt s/p R THR and with hx of Bil TKR and L THR (11/19)  Clinical Impression  Pt s/p R THR and presents with decreased R LE strength/ROM and post op pain limiting functional mobility.  Pt should progress to dc home with assist of family.    Follow Up Recommendations Follow surgeon's recommendation for DC plan and follow-up therapies    Equipment Recommendations  Rolling walker with 5" wheels    Recommendations for Other Services       Precautions / Restrictions Precautions Precautions: Fall Restrictions Weight Bearing Restrictions: No Other Position/Activity Restrictions: WBAT      Mobility  Bed Mobility Overal bed mobility: Needs Assistance Bed Mobility: Supine to Sit     Supine to sit: Min assist     General bed mobility comments: cues for sequence and use of L LE to self assist  Transfers Overall transfer level: Needs assistance Equipment used: Rolling walker (2 wheeled) Transfers: Sit to/from Stand Sit to Stand: Min assist;Min guard         General transfer comment: cues for LE management and use of UEs to self assist  Ambulation/Gait Ambulation/Gait assistance: Min assist;Min guard Gait Distance (Feet): 123 Feet Assistive device: Rolling walker (2 wheeled) Gait Pattern/deviations: Step-to pattern;Step-through pattern;Decreased step length - right;Decreased step length - left;Shuffle;Trunk flexed Gait velocity: decr   General Gait Details: cues for posture, position from RW and initial sequence  Stairs            Wheelchair Mobility    Modified Rankin (Stroke Patients Only)       Balance Overall balance assessment: Mild deficits observed, not formally tested                                           Pertinent Vitals/Pain Pain Assessment: 0-10 Pain Score: 5  Pain  Location: R hip Pain Descriptors / Indicators: Aching;Sore Pain Intervention(s): Limited activity within patient's tolerance;Monitored during session;Premedicated before session;Ice applied    Home Living Family/patient expects to be discharged to:: Private residence Living Arrangements: Spouse/significant other Available Help at Discharge: Family Type of Home: House Home Access: Stairs to enter   Secretary/administrator of Steps: 1 and 1  Home Layout: One level Home Equipment: Environmental consultant - 2 wheels;Shower seat;Adaptive equipment;Bedside commode;Cane - single point(lift chair) Additional Comments: Pt states walker in poor condition; Pt states he cares for spouse but his sister will be there to assist during his recovery    Prior Function Level of Independence: Independent with assistive device(s)         Comments: RW as needed 2* hip pain     Hand Dominance        Extremity/Trunk Assessment   Upper Extremity Assessment Upper Extremity Assessment: Overall WFL for tasks assessed    Lower Extremity Assessment Lower Extremity Assessment: RLE deficits/detail RLE Deficits / Details: AAROM at hip to 90 flex and 15 abd; Noted muscle atrophy in thigh vs L LE       Communication   Communication: No difficulties  Cognition Arousal/Alertness: Awake/alert Behavior During Therapy: WFL for tasks assessed/performed Overall Cognitive Status: Within Functional Limits for tasks assessed  General Comments      Exercises Total Joint Exercises Ankle Circles/Pumps: AROM;Both;15 reps;Supine Quad Sets: AROM;Both;10 reps;Supine Heel Slides: AAROM;Right;20 reps;Supine Hip ABduction/ADduction: AAROM;Right;15 reps;Supine   Assessment/Plan    PT Assessment Patient needs continued PT services  PT Problem List Decreased strength;Decreased range of motion;Decreased activity tolerance;Decreased balance;Decreased mobility;Decreased knowledge  of use of DME;Pain       PT Treatment Interventions DME instruction;Gait training;Stair training;Functional mobility training;Therapeutic activities;Therapeutic exercise;Patient/family education    PT Goals (Current goals can be found in the Care Plan section)  Acute Rehab PT Goals Patient Stated Goal: Regain IND to care for spouse PT Goal Formulation: With patient Time For Goal Achievement: 03/21/19 Potential to Achieve Goals: Good    Frequency 7X/week   Barriers to discharge        Co-evaluation               AM-PAC PT "6 Clicks" Mobility  Outcome Measure Help needed turning from your back to your side while in a flat bed without using bedrails?: A Little Help needed moving from lying on your back to sitting on the side of a flat bed without using bedrails?: A Little Help needed moving to and from a bed to a chair (including a wheelchair)?: A Little Help needed standing up from a chair using your arms (e.g., wheelchair or bedside chair)?: A Little Help needed to walk in hospital room?: A Little Help needed climbing 3-5 steps with a railing? : A Little 6 Click Score: 18    End of Session Equipment Utilized During Treatment: Gait belt Activity Tolerance: Patient tolerated treatment well Patient left: in chair;with call bell/phone within reach;with chair alarm set Nurse Communication: Mobility status PT Visit Diagnosis: Difficulty in walking, not elsewhere classified (R26.2)    Time: 6237-6283 PT Time Calculation (min) (ACUTE ONLY): 39 min   Charges:   PT Evaluation $PT Eval Low Complexity: 1 Low PT Treatments $Gait Training: 8-22 mins $Therapeutic Exercise: 8-22 mins        [place] Pager [phone] Office [phone]   Rasha Ibe 03/14/2019, 12:15 PM

## 2019-03-14 NOTE — Plan of Care (Signed)
Plan of care reviewed and discussed with the patient. 

## 2019-03-15 NOTE — Anesthesia Postprocedure Evaluation (Signed)
Anesthesia Post Note  Patient: Isaiah Wilson  Procedure(s) Performed: TOTAL HIP ARTHROPLASTY ANTERIOR APPROACH (Right Hip)     Patient location during evaluation: PACU Anesthesia Type: Spinal and MAC Level of consciousness: oriented and awake and alert Pain management: pain level controlled Vital Signs Assessment: post-procedure vital signs reviewed and stable Respiratory status: spontaneous breathing, respiratory function stable and patient connected to nasal cannula oxygen Cardiovascular status: blood pressure returned to baseline and stable Postop Assessment: no headache, no backache and no apparent nausea or vomiting Anesthetic complications: no    Last Vitals:  Vitals:   03/14/19 0129 03/14/19 0622  BP: (!) 148/93 (!) 154/75  Pulse: 69 63  Resp: 18 14  Temp: 36.6 C 36.6 C  SpO2: 99% 100%    Last Pain:  Vitals:   03/14/19 1252  TempSrc:   PainSc: [place]

## 2019-03-15 NOTE — Discharge Summary (Signed)
Physician Discharge Summary   Patient ID: Isaiah Wilson MRN: [id number] DOB/AGE: 1934-07-20 84 y.o.  Admit date: 03/13/2019 Discharge date: 03/14/2019  Primary Diagnosis: Primary osteoarthritis right hip    Admission Diagnoses:  Past Medical History:  Diagnosis Date   Abnormal EKG    hx left anterior fasicular block on old ekg 2017   Allergic rhinitis    Allergy    Barrett's esophagus    Benign prostatic hypertrophy    Colitis, ischemic (HCC)    X 2   Complication of anesthesia    SLOW TO AWAKEN AFTER 1 SURGERY 20  YRS AGO   DJD (degenerative joint disease)    SPINE, AND OA   GERD (gastroesophageal reflux disease)    H/O hiatal hernia    HOH (hard of hearing)    BOTH EARS   HOH (hard of hearing)    BOTH EARS   Hyperlipidemia    no medication per pt.   Hypertension    pt denies says cardura for bladder   Neuropathy     LEFT HAND AND ARM NUMB ALL THE TIME   Overweight(278.02)    Discharge Diagnoses:   Active Problems:   OA (osteoarthritis) of hip  Estimated body mass index is 32.3 kg/m as calculated from the following:   Height as of this encounter:  (1.778 m).   Weight as of this encounter: 102.1 kg.  Procedure(s) (LRB): TOTAL HIP ARTHROPLASTY ANTERIOR APPROACH (Right)   Consults: None  HPI: Isaiah Wilson, 84 y.o. male, has a history of pain and functional disability in the right hip(s) due to arthritis and patient has failed non-surgical conservative treatments for greater than 12 weeks to include NSAID's and/or analgesics and activity modification.  Onset of symptoms was gradual starting 1 years ago with gradually worsening course since that time.The patient noted no past surgery on the right hip(s).  Patient currently rates pain in the right hip at 9 out of 10 with activity. Patient has worsening of pain with activity and weight bearing, pain that interfers with activities of daily living and pain with passive range of motion.  Patient has evidence of bone on bone osteoarthritis with subchondral cystis formation and early signs of flattening of the femoral head by imaging studies. This condition presents safety issues increasing the risk of falls.  There is no current active infection.  Laboratory Data: Admission on 03/13/2019, Discharged on 03/14/2019  Component Date Value Ref Range Status   WBC 03/14/2019 12.1* 4.0 - 10.5 K/uL Final   RBC 03/14/2019 4.04* 4.22 - 5.81 MIL/uL Final   Hemoglobin 03/14/2019 11.8* 13.0 - 17.0 g/dL Final   HCT 40/98/1191 36.4* 39.0 - 52.0 % Final   MCV 03/14/2019 90.1  80.0 - 100.0 fL Final   MCH 03/14/2019 29.2  26.0 - 34.0 pg Final   MCHC 03/14/2019 32.4  30.0 - 36.0 g/dL Final   RDW 47/82/9562 14.1  11.5 - 15.5 % Final   Platelets 03/14/2019 198  150 - 400 K/uL Final   nRBC 03/14/2019 0.0  0.0 - 0.2 % Final   Performed at [facility], [address] [place], [place] [address]   Sodium 03/14/2019 136  135 - 145 mmol/L Final   Potassium 03/14/2019 3.9  3.5 - 5.1 mmol/L Final   Chloride 03/14/2019 103  98 - 111 mmol/L Final   CO2 03/14/2019 23  22 - 32 mmol/L Final   Glucose, Bld 03/14/2019 156* 70 - 99 mg/dL Final  BUN 03/14/2019 17  8 - 23 mg/dL Final   Creatinine, Ser 03/14/2019 0.84  0.61 - 1.24 mg/dL Final   Calcium 29/92/4268 8.7* 8.9 - 10.3 mg/dL Final   GFR calc non Af Amer 03/14/2019 >60  >60 mL/min Final   GFR calc Af Amer 03/14/2019 >60  >60 mL/min Final   Anion gap 03/14/2019 10  5 - 15 Final   Performed at [facility], [address] [place], [place] [address]  Hospital Outpatient Visit on 03/09/2019  Component Date Value Ref Range Status   SARS-CoV-2, NAA 03/09/2019 NOT DETECTED  NOT DETECTED Final   Comment: (NOTE) This nucleic acid amplification test was developed and its performance characteristics determined by [employer]. Nucleic acid amplification tests include PCR and TMA. This test  has not been FDA cleared or approved. This test has been authorized by FDA under an Emergency Use Authorization (EUA). This test is only authorized for the duration of time the declaration that circumstances exist justifying the authorization of the emergency use of in vitro diagnostic tests for detection of SARS-CoV-2 virus and/or diagnosis of COVID-19 infection under section 564(b)(1) of the Act, 21 U.S.C. 222LNL-8(X) (1), unless the authorization is terminated or revoked sooner. When diagnostic testing is negative, the possibility of a false negative result should be considered in the context of a patient's recent exposures and the presence of clinical signs and symptoms consistent with COVID-19. An individual without symptoms of COVID- 19 and who is not shedding SARS-CoV-2 vi                          rus would expect to have a negative (not detected) result in this assay. Performed At: [facility] [address] [place], [place] [id number] Jolene Schimke MD [id number]    Coronavirus Source 03/09/2019 NASOPHARYNGEAL   Final   Performed at [facility], [address] [place], [place] [address]  Hospital Outpatient Visit on 03/07/2019  Component Date Value Ref Range Status   MRSA, PCR 03/07/2019 POSITIVE* NEGATIVE Final   Comment: RESULT CALLED TO, READ BACK BY AND VERIFIED WITH: AFTERHOURS    Staphylococcus aureus 03/07/2019 POSITIVE* NEGATIVE Final   Comment: (NOTE) The Xpert SA Assay (FDA approved for NASAL specimens in patients 8 years of age and older), is one component of a comprehensive surveillance program. It is not intended to diagnose infection nor to guide or monitor treatment. Performed at [facility], [address], [place], [place] [address]    aPTT 03/07/2019 33  24 - 36 seconds Final   Performed at [facility], [address] [place] [address] [place], [place] [address]   WBC 03/07/2019 9.6  4.0 - 10.5 K/uL  Final   RBC 03/07/2019 4.42  4.22 - 5.81 MIL/uL Final   Hemoglobin 03/07/2019 12.7* 13.0 - 17.0 g/dL Final   HCT 74/04/8785 39.7  39.0 - 52.0 % Final   MCV 03/07/2019 89.8  80.0 - 100.0 fL Final   MCH 03/07/2019 28.7  26.0 - 34.0 pg Final   MCHC 03/07/2019 32.0  30.0 - 36.0 g/dL Final   RDW 76/72/0947 14.4  11.5 - 15.5 % Final   Platelets 03/07/2019 226  150 - 400 K/uL Final   nRBC 03/07/2019 0.0  0.0 - 0.2 % Final   Neutrophils Relative % 03/07/2019 54  % Final   Neutro Abs 03/07/2019 5.2  1.7 - 7.7 K/uL Final   Lymphocytes Relative 03/07/2019 34  %  Final   Lymphs Abs 03/07/2019 3.2  0.7 - 4.0 K/uL Final   Monocytes Relative 03/07/2019 9  % Final   Monocytes Absolute 03/07/2019 0.8  0.1 - 1.0 K/uL Final   Eosinophils Relative 03/07/2019 2  % Final   Eosinophils Absolute 03/07/2019 0.2  0.0 - 0.5 K/uL Final   Basophils Relative 03/07/2019 1  % Final   Basophils Absolute 03/07/2019 0.1  0.0 - 0.1 K/uL Final   Immature Granulocytes 03/07/2019 0  % Final   Abs Immature Granulocytes 03/07/2019 0.02  0.00 - 0.07 K/uL Final   Performed at [facility], [address], [place], [place] [address]   Sodium 03/07/2019 140  135 - 145 mmol/L Final   Potassium 03/07/2019 3.9  3.5 - 5.1 mmol/L Final   Chloride 03/07/2019 107  98 - 111 mmol/L Final   CO2 03/07/2019 24  22 - 32 mmol/L Final   Glucose, Bld 03/07/2019 94  70 - 99 mg/dL Final   BUN 60/45/4098 18  8 - 23 mg/dL Final   Creatinine, Ser 03/07/2019 0.89  0.61 - 1.24 mg/dL Final   Calcium 11/91/4782 9.3  8.9 - 10.3 mg/dL Final   Total Protein 95/62/1308 7.5  6.5 - 8.1 g/dL Final   Albumin 65/78/4696 4.2  3.5 - 5.0 g/dL Final   AST 29/52/8413 24  15 - 41 U/L Final   ALT 03/07/2019 18  0 - 44 U/L Final   Alkaline Phosphatase 03/07/2019 64  38 - 126 U/L Final   Total Bilirubin 03/07/2019 0.6  0.3 - 1.2 mg/dL Final   GFR calc non Af Amer 03/07/2019 >60  >60 mL/min Final   GFR calc Af  Amer 03/07/2019 >60  >60 mL/min Final   Anion gap 03/07/2019 9  5 - 15 Final   Performed at [facility], [address] [place], [place] [address]   Prothrombin Time 03/07/2019 13.1  11.4 - 15.2 seconds Final   INR 03/07/2019 1.0  0.8 - 1.2 Final   Comment: (NOTE) INR goal varies based on device and disease states. Performed at [facility], [address], [place], [place] [address]    ABO/RH(D) 03/07/2019 A POS   Final   Antibody Screen 03/07/2019 NEG   Final   Sample Expiration 03/07/2019 03/16/2019,2359   Final   Extend sample reason 03/07/2019    Final                   Value:NO TRANSFUSIONS OR PREGNANCY IN THE PAST 3 MONTHS Performed at [facility], [address], [place], [place] [address]      X-Rays:Dg Pelvis Portable  Result Date: 03/13/2019 CLINICAL DATA:  Status post right hip arthroplasty. EXAM: PORTABLE PELVIS 1-2 VIEWS COMPARISON:  Intraoperative imaging earlier today. FINDINGS: Frontal projection demonstrates normal alignment of a right total hip arthroplasty. Lateral surgical drain present. Visualized bony pelvis is unremarkable. IMPRESSION: Normal alignment of right total hip arthroplasty. Electronically Signed   By: Irish Lack M.D.   On: 03/13/2019 17:26   Dg C-arm 1-60 Min-no Report  Result Date: 03/13/2019 Fluoroscopy was utilized by the requesting physician.  No radiographic interpretation.   Dg Hip Operative Unilat With Pelvis Right  Result Date: 03/13/2019 CLINICAL DATA:  Intraoperative imaging provided for right hip arthroplasty. EXAM: OPERATIVE RIGHT HIP (WITH PELVIS IF PERFORMED) 6 VIEWS TECHNIQUE: Fluoroscopic spot image(s) were submitted for interpretation post-operatively. COMPARISON:  04/18/2018 FINDINGS: Images show the sequential placement a right total  hip arthroplasty. The femoroacetabular components appear well seated and aligned. No evidence of an operative complication.  IMPRESSION: Well-positioned total right hip arthroplasty. Electronically Signed   By: Lajean Manes M.D.   On: 03/13/2019 16:39    EKG: Orders placed or performed during the hospital encounter of 03/07/19   EKG   EKG     Hospital Course: Patient was admitted to [facility] and taken to the OR and underwent the above state procedure without complications.  Patient tolerated the procedure well and was later transferred to the recovery room and then to the orthopaedic floor for postoperative care.  They were given PO and IV analgesics for pain control following their surgery.  They were given 24 hours of postoperative antibiotics of  Anti-infectives (From admission, onward)   Start     Dose/Rate Route Frequency Ordered Stop   03/14/19 0600  ceFAZolin (ANCEF) IVPB 2g/100 mL premix  Status:  Discontinued     2 g 200 mL/hr over 30 Minutes Intravenous On call to O.R. 03/13/19 1208 03/13/19 1210   03/13/19 2000  ceFAZolin (ANCEF) IVPB 2g/100 mL premix     2 g 200 mL/hr over 30 Minutes Intravenous Every 6 hours 03/13/19 1648 03/14/19 0213   03/13/19 1215  vancomycin (VANCOCIN) 1,500 mg in sodium chloride 0.9 % 500 mL IVPB  Status:  Discontinued     1,500 mg 250 mL/hr over 120 Minutes Intravenous  Once 03/13/19 1210 03/13/19 1211   03/13/19 1215  ceFAZolin (ANCEF) IVPB 2g/100 mL premix     2 g 200 mL/hr over 30 Minutes Intravenous On call to O.R. 03/13/19 1210 03/13/19 1343   03/13/19 0600  vancomycin (VANCOCIN) 1,500 mg in sodium chloride 0.9 % 500 mL IVPB     1,500 mg 250 mL/hr over 120 Minutes Intravenous 60 min pre-op 03/12/19 0717 03/13/19 1441     and started on DVT prophylaxis in the form of Aspirin.   PT and OT were ordered for total hip protocol.  The patient was allowed to be WBAT with therapy. Discharge planning was consulted to help with postop disposition and equipment needs.  Patient had a good night on the evening of surgery.  They started to get up OOB with therapy on day  one.  Hemovac drain was pulled without difficulty.  The patient had progressed with therapy and meeting their goals.  Incision was healing well.  Patient was seen in rounds and was ready to go home.  Diet: Cardiac diet Activity:WBAT Follow-up:in 2 weeks Disposition - Home Discharged Condition: stable   Discharge Instructions    Call MD / Call 911   Complete by: As directed    If you experience chest pain or shortness of breath, CALL 911 and be transported to the hospital emergency room.  If you develope a fever above 101 F, pus (white drainage) or increased drainage or redness at the wound, or calf pain, call your surgeon's office.   Constipation Prevention   Complete by: As directed    Drink plenty of fluids.  Prune juice may be helpful.  You may use a stool softener, such as Colace (over the counter) 100 mg twice a day.  Use MiraLax (over the counter) for constipation as needed.   Diet - low sodium heart healthy   Complete by: As directed    Discharge instructions   Complete by: As directed    Dr. Gaynelle Arabian Total Joint Specialist [facility] [address] [place] [address] [place], [place] [address] [phone]  ANTERIOR APPROACH TOTAL HIP REPLACEMENT POSTOPERATIVE DIRECTIONS   Hip Rehabilitation, Guidelines Following Surgery  The results of a hip operation are greatly improved after range of motion and muscle strengthening exercises. Follow all safety measures which are given to protect your hip. If any of these exercises cause increased pain or swelling in your joint, decrease the amount until you are comfortable again. Then slowly increase the exercises. Call your caregiver if you have problems or questions.   HOME CARE INSTRUCTIONS  Remove items at home which could result in a fall. This includes throw rugs or furniture in walking pathways.  ICE to the affected hip every three hours for 30 minutes at a time and then as needed for pain and swelling.  Continue to use ice on  the hip for pain and swelling from surgery. You may notice swelling that will progress down to the foot and ankle.  This is normal after surgery.  Elevate the leg when you are not up walking on it.   Continue to use the breathing machine which will help keep your temperature down.  It is common for your temperature to cycle up and down following surgery, especially at night when you are not up moving around and exerting yourself.  The breathing machine keeps your lungs expanded and your temperature down.   DIET You may resume your previous home diet once your are discharged from the hospital.  DRESSING / WOUND CARE / SHOWERING You may shower 3 days after surgery, but keep the wounds dry during showering.  You may use an occlusive plastic wrap (Press'n Seal for example), NO SOAKING/SUBMERGING IN THE BATHTUB.  If the bandage gets wet, change with a clean dry gauze.  If the incision gets wet, pat the wound dry with a clean towel. You may start showering once you are discharged home but do not submerge the incision under water. Just pat the incision dry and apply a dry gauze dressing on daily. Change the surgical dressing daily and reapply a dry dressing each time.  ACTIVITY Walk with your walker as instructed. Use walker as long as suggested by your caregivers. Avoid periods of inactivity such as sitting longer than an hour when not asleep. This helps prevent blood clots.  You may resume a sexual relationship in one month or when given the OK by your doctor.  You may return to work once you are cleared by your doctor.  Do not drive a car for 6 weeks or until released by you surgeon.  Do not drive while taking narcotics.  WEIGHT BEARING Weight bearing as tolerated with assist device (walker, cane, etc) as directed, use it as long as suggested by your surgeon or therapist, typically at least 4-6 weeks.  POSTOPERATIVE CONSTIPATION PROTOCOL Constipation - defined medically as fewer than three  stools per week and severe constipation as less than one stool per week.  One of the most common issues patients have following surgery is constipation.  Even if you have a regular bowel pattern at home, your normal regimen is likely to be disrupted due to multiple reasons following surgery.  Combination of anesthesia, postoperative narcotics, change in appetite and fluid intake all can affect your bowels.  In order to avoid complications following surgery, here are some recommendations in order to help you during your recovery period.  Colace (docusate) - Pick up an over-the-counter form of Colace or another stool softener and take twice a day as long as you are requiring  postoperative pain medications.  Take with a full glass of water daily.  If you experience loose stools or diarrhea, hold the colace until you stool forms back up.  If your symptoms do not get better within 1 week or if they get worse, check with your doctor.  Dulcolax (bisacodyl) - Pick up over-the-counter and take as directed by the product packaging as needed to assist with the movement of your bowels.  Take with a full glass of water.  Use this product as needed if not relieved by Colace only.   MiraLax (polyethylene glycol) - Pick up over-the-counter to have on hand.  MiraLax is a solution that will increase the amount of water in your bowels to assist with bowel movements.  Take as directed and can mix with a glass of water, juice, soda, coffee, or tea.  Take if you go more than two days without a movement. Do not use MiraLax more than once per day. Call your doctor if you are still constipated or irregular after using this medication for 7 days in a row.  If you continue to have problems with postoperative constipation, please contact the office for further assistance and recommendations.  If you experience "the worst abdominal pain ever" or develop nausea or vomiting, please contact the office immediatly for further  recommendations for treatment.  ITCHING  If you experience itching with your medications, try taking only a single pain pill, or even half a pain pill at a time.  You can also use Benadryl over the counter for itching or also to help with sleep.   TED HOSE STOCKINGS Wear the elastic stockings on both legs for three weeks following surgery during the day but you may remove then at night for sleeping.  MEDICATIONS See your medication summary on the "After Visit Summary" that the nursing staff will review with you prior to discharge.  You may have some home medications which will be placed on hold until you complete the course of blood thinner medication.  It is important for you to complete the blood thinner medication as prescribed by your surgeon.  Continue your approved medications as instructed at time of discharge.  PRECAUTIONS If you experience chest pain or shortness of breath - call 911 immediately for transfer to the hospital emergency department.  If you develop a fever greater that 101 F, purulent drainage from wound, increased redness or drainage from wound, foul odor from the wound/dressing, or calf pain - CONTACT YOUR SURGEON.                                                   FOLLOW-UP APPOINTMENTS Make sure you keep all of your appointments after your operation with your surgeon and caregivers. You should call the office at the above phone number and make an appointment for approximately two weeks after the date of your surgery or on the date instructed by your surgeon outlined in the "After Visit Summary".  RANGE OF MOTION AND STRENGTHENING EXERCISES  These exercises are designed to help you keep full movement of your hip joint. Follow your caregiver's or physical therapist's instructions. Perform all exercises about fifteen times, three times per day or as directed. Exercise both hips, even if you have had only one joint replacement. These exercises can be done on a training  (exercise) mat, on the  floor, on a table or on a bed. Use whatever works the best and is most comfortable for you. Use music or television while you are exercising so that the exercises are a pleasant break in your day. This will make your life better with the exercises acting as a break in routine you can look forward to.  Lying on your back, slowly slide your foot toward your buttocks, raising your knee up off the floor. Then slowly slide your foot back down until your leg is straight again.  Lying on your back spread your legs as far apart as you can without causing discomfort.  Lying on your side, raise your upper leg and foot straight up from the floor as far as is comfortable. Slowly lower the leg and repeat.  Lying on your back, tighten up the muscle in the front of your thigh (quadriceps muscles). You can do this by keeping your leg straight and trying to raise your heel off the floor. This helps strengthen the largest muscle supporting your knee.  Lying on your back, tighten up the muscles of your buttocks both with the legs straight and with the knee bent at a comfortable angle while keeping your heel on the floor.   IF YOU ARE TRANSFERRED TO A SKILLED REHAB FACILITY If the patient is transferred to a skilled rehab facility following release from the hospital, a list of the current medications will be sent to the facility for the patient to continue.  When discharged from the skilled rehab facility, please have the facility set up the patient's Home Health Physical Therapy prior to being released. Also, the skilled facility will be responsible for providing the patient with their medications at time of release from the facility to include their pain medication, the muscle relaxants, and their blood thinner medication. If the patient is still at the rehab facility at time of the two week follow up appointment, the skilled rehab facility will also need to assist the patient in arranging follow up  appointment in our office and any transportation needs.  MAKE SURE YOU:  Understand these instructions.  Get help right away if you are not doing well or get worse.    Pick up stool softner and laxative for home use following surgery while on pain medications. Do not submerge incision under water. Please use good hand washing techniques while changing dressing each day. May shower starting three days after surgery. Please use a clean towel to pat the incision dry following showers. Continue to use ice for pain and swelling after surgery. Do not use any lotions or creams on the incision until instructed by your surgeon.   Increase activity slowly as tolerated   Complete by: As directed      Allergies as of 03/14/2019   No Known Allergies     Medication List    STOP taking these medications   acetaminophen 650 MG CR tablet Commonly known as: TYLENOL   multivitamin with minerals Tabs tablet   zinc gluconate 50 MG tablet     TAKE these medications   aspirin 325 MG EC tablet Take 1 tablet (325 mg total) by mouth 2 (two) times daily. What changed:   medication strength  how much to take  when to take this   azelastine 0.1 % nasal spray Commonly known as: ASTELIN Place 1 spray into both nostrils 2 (two) times daily as needed (allergies.).   doxazosin 8 MG tablet Commonly known as: CARDURA TAKE ONE TABLET BY  MOUTH EVERY NIGHT AT BEDTIME   EPINEPHrine 0.3 mg/0.3 mL Soaj injection Commonly known as: EPI-PEN Inject 0.3 mg into the muscle as needed (allergic reaction).   fluticasone 50 MCG/ACT nasal spray Commonly known as: FLONASE Place 2 sprays into both nostrils daily.   loratadine 10 MG tablet Commonly known as: CLARITIN Take 10 mg by mouth daily.   Lubricant Eye Drops 0.4-0.3 % Soln Generic drug: Polyethyl Glycol-Propyl Glycol Place 1 drop into both eyes 3 (three) times daily as needed (DRY/IRRITATED EYES.).   methocarbamol 500 MG tablet Commonly known  as: ROBAXIN Take 1 tablet (500 mg total) by mouth every 6 (six) hours as needed for muscle spasms.   montelukast 10 MG tablet Commonly known as: SINGULAIR TAKE ONE TABLET BY MOUTH EVERY NIGHT AT BEDTIME   NexIUM 40 MG capsule Generic drug: esomeprazole TAKE ONE CAPSULE BY MOUTH DAILY BEFORE SUPPER   nystatin cream Commonly known as: MYCOSTATIN Apply 1 application topically 2 (two) times daily.   oxyCODONE 5 MG immediate release tablet Commonly known as: Oxy IR/ROXICODONE Take 1 tablet (5 mg total) by mouth every 6 (six) hours as needed for moderate pain.   traMADol 50 MG tablet Commonly known as: ULTRAM Take 1-2 tablets (50-100 mg total) by mouth every 6 (six) hours as needed for moderate pain. What changed:   how much to take  reasons to take this      Follow-up Information    Aluisio, Homero FellersFrank, MD. Schedule an appointment as soon as possible for a visit on 03/28/2019.   Specialty: Orthopedic Surgery Contact information: [address] [place] [place] [address] [phone]           Signed: Dimitri PedAmber Valynn Schamberger, PA-C Orthopaedic Surgery 03/15/2019, 10:03 AM

## 2019-03-29 ENCOUNTER — Other Ambulatory Visit: Payer: Self-pay | Admitting: Family Medicine

## 2019-04-11 ENCOUNTER — Other Ambulatory Visit: Payer: Self-pay | Admitting: Family Medicine

## 2019-04-12 ENCOUNTER — Other Ambulatory Visit: Payer: Self-pay | Admitting: Family Medicine

## 2019-05-10 ENCOUNTER — Other Ambulatory Visit: Payer: Self-pay | Admitting: Family Medicine

## 2019-05-17 ENCOUNTER — Encounter: Payer: Self-pay | Admitting: Family Medicine

## 2019-05-17 ENCOUNTER — Ambulatory Visit (INDEPENDENT_AMBULATORY_CARE_PROVIDER_SITE_OTHER): Payer: Medicare Other | Admitting: Family Medicine

## 2019-05-17 ENCOUNTER — Other Ambulatory Visit: Payer: Self-pay

## 2019-05-17 VITALS — BP 108/62 | HR 92 | Temp 97.6°F | Wt 234.8 lb

## 2019-05-17 DIAGNOSIS — N401 Enlarged prostate with lower urinary tract symptoms: Secondary | ICD-10-CM

## 2019-05-17 DIAGNOSIS — R351 Nocturia: Secondary | ICD-10-CM | POA: Diagnosis not present

## 2019-05-17 DIAGNOSIS — R3915 Urgency of urination: Secondary | ICD-10-CM

## 2019-05-17 DIAGNOSIS — M1990 Unspecified osteoarthritis, unspecified site: Secondary | ICD-10-CM | POA: Diagnosis not present

## 2019-05-17 NOTE — Progress Notes (Signed)
Subjective:     Patient ID: Isaiah Wilson, male   DOB: 1934-08-30, 84 y.o.   MRN: [id number]  HPI   [name] Caller is seen for medical follow-up.  He has chronic problems including history of obesity, Barrett's esophagus, GERD, osteoarthritis with multiple previous orthopedic joint procedures, BPH.  Very challenging year.  His wife was diagnosed with microscopic polyangiitis.  He states he has lost about 30 pounds this year due to his efforts at caring for her.  He feels his appetite is stable.  Sometimes only eats 1 main meal per day.  Main symptom is some urine urgency.  This is a newer symptom.  He does drink 2 cups of coffee in the morning and occasional soda.  Tries to avoid drinking caffeine after 6 PM.  He denies any slow stream.  He does take Cardura which he was placed on years ago by previous primary provider and he thinks that is controlling his BPH obstructive symptoms.  He has urgency to go and has difficulty making it sometimes in time.  He usually gets up 3-4 times at night.  No burning with urination.  No recent fevers or chills.  Denies any recent falls.  He continues to have some knee pains- really unchanged  Past Medical History:  Diagnosis Date  . Abnormal EKG    hx left anterior fasicular block on old ekg 2017  . Allergic rhinitis   . Allergy   . Barrett's esophagus   . Benign prostatic hypertrophy   . Colitis, ischemic (HCC)    X 2  . Complication of anesthesia    SLOW TO AWAKEN AFTER 1 SURGERY 20  YRS AGO  . DJD (degenerative joint disease)    SPINE, AND OA  . GERD (gastroesophageal reflux disease)   . H/O hiatal hernia   . HOH (hard of hearing)    BOTH EARS  . HOH (hard of hearing)    BOTH EARS  . Hyperlipidemia    no medication per pt.  . Hypertension    pt denies says cardura for bladder  . Neuropathy     LEFT HAND AND ARM NUMB ALL THE TIME  . Overweight(278.02)    Past Surgical History:  Procedure Laterality Date  . 2 neck surgeries  2005-2006   Dr. Georgia Dom cx decompression, diskectomy,fusion C6-7 allograftWITH PLATE  . APPENDECTOMY    . EYE SURGERY     bilateral cataract surgerywith lens implant  . FOOT SURGERY     right x 3  . LAPAROSCOPIC CHOLECYSTECTOMY  1993   Dr. Luan Moore  . left total knee replacement  07/2007   Dr. Eulah Pont   . right total knee replacement  2012   Dr. Despina Hick  . SEPTOPLASTY  1975, May 2013   x3  . TONSILLECTOMY  as child  . TOTAL HIP ARTHROPLASTY Left 04/18/2018   Procedure: LEFT TOTAL HIP ARTHROPLASTY ANTERIOR APPROACH;  Surgeon: Ollen Gross, MD;  Location: [facility];  Service: Orthopedics;  Laterality: Left;  . TOTAL HIP ARTHROPLASTY Right 03/13/2019   Procedure: TOTAL HIP ARTHROPLASTY ANTERIOR APPROACH;  Surgeon: Ollen Gross, MD;  Location: [facility];  Service: Orthopedics;  Laterality: Right;   . TOTAL KNEE REVISION Left 11/11/2015   Procedure: LEFT KNEE POLYETHELENE REVISION;  Surgeon: Ollen Gross, MD;  Location: [facility];  Service: Orthopedics;  Laterality: Left;  . TOTAL KNEE REVISION Left 12/07/2016   Procedure: LEFT TOTAL KNEE REVISION;  Surgeon: Ollen Gross, MD;  Location: [facility];  Service: Orthopedics;  Laterality: Left;  Adductor Block    reports that he has never smoked. He has never used smokeless tobacco. He reports that he does not drink alcohol or use drugs. family history includes Heart disease in his father and sister; Leukemia in his mother. No Known Allergies   Review of Systems  Constitutional: Negative for fatigue.  Eyes: Negative for visual disturbance.  Respiratory: Negative for cough, chest tightness and shortness of breath.   Cardiovascular: Negative for chest pain, palpitations and leg swelling.  Genitourinary: Positive for frequency and urgency. Negative for difficulty urinating.  Musculoskeletal: Positive for arthralgias.  Neurological: Negative for dizziness, syncope, weakness, light-headedness and headaches.       Objective:   Physical Exam Vitals  reviewed.  Constitutional:      Appearance: Normal appearance.  Cardiovascular:     Rate and Rhythm: Normal rate and regular rhythm.  Pulmonary:     Effort: Pulmonary effort is normal.     Breath sounds: Normal breath sounds.  Musculoskeletal:     Right lower leg: No edema.     Left lower leg: No edema.  Neurological:     Mental Status: He is alert.        Assessment:     #1 urinary urgency.  He is not describing any obstructive symptoms.  He is reluctant to take additional medications at this time  #2 history of BPH stable on Cardura  #3 severe osteoarthritis involving multiple joints    Plan:     -Reduce caffeine use overall (and esp after 2 pm) and restrict fluid intake after about 6 PM -We discussed medication such as Myrbetriq but at this point he is reluctant.  Would avoid anti-cholinergics if possible as fall risk is moderate. -Continue weight loss efforts -Routine follow-up 6 months  [name] MD [facility]

## 2019-05-17 NOTE — Patient Instructions (Signed)
Let me know if urine urgency increases and we could consider medication such as Myrbetriq.

## 2019-06-04 DIAGNOSIS — J301 Allergic rhinitis due to pollen: Secondary | ICD-10-CM | POA: Diagnosis not present

## 2019-06-05 ENCOUNTER — Encounter: Payer: Self-pay | Admitting: Family Medicine

## 2019-06-11 ENCOUNTER — Ambulatory Visit (INDEPENDENT_AMBULATORY_CARE_PROVIDER_SITE_OTHER): Payer: Medicare PPO

## 2019-06-11 VITALS — BP 152/60 | Ht 70.0 in | Wt 225.4 lb

## 2019-06-11 DIAGNOSIS — Z0000 Encounter for general adult medical examination without abnormal findings: Secondary | ICD-10-CM

## 2019-06-11 NOTE — Patient Instructions (Signed)
Isaiah Wilson , Thank you for taking time to come for your Medicare Wellness Visit. I appreciate your ongoing commitment to your health goals. Please review the following plan we discussed and let me know if I can assist you in the future.   Screening recommendations/referrals: Colorectal Screening: colonoscopy done 12/21/2006. No more needed due to advanced age.   Vision and Dental Exams: Recommended annual ophthalmology exams for early detection of glaucoma and other disorders of the eye Recommended annual dental exams for proper oral hygiene. He states he has seen both of these in the last year.   Diabetic Exams: Diabetic Eye Exam: N/A Diabetic Foot Exam: N/A  Vaccinations: Influenza vaccine:completed 02/28/2019. Due again Fall 2021.  Pneumococcal vaccine: completed 02/17/2014 & 11/16/2016. Currently up to date.  Tdap vaccine:completed 06/22/2009. Due soon. Patient understands this is only covered by Medicare in event of injury. Shingles vaccine: completed 11/24/2017 & 02/23/2018.  Advanced directives: Advance directives discussed with you today. Advance directives discussed with you today. You have declined to receive documents for completion. Please bring a copy to our office once you have notarized it.  Goals: Recommend to drink at least 6-8 8oz glasses of water per day. Recommend to exercise for at least 150 minutes per week. Recommend to remove any items from the home that may cause slips or trips.  Next appointment: Please schedule your Annual Wellness Visit with your Nurse Health Advisor in one year.  Preventive Care 20 Years and Older, Male Preventive care refers to lifestyle choices and visits with your health care provider that can promote health and wellness. What does preventive care include?  A yearly physical exam. This is also called an annual well check.  Dental exams once or twice a year.  Routine eye exams. Ask your health care provider how often you should have  your eyes checked.  Personal lifestyle choices, including:  Daily care of your teeth and gums.  Regular physical activity.  Eating a healthy diet.  Avoiding tobacco and drug use.  Limiting alcohol use.  Practicing safe sex.  Taking low doses of aspirin every day if recommended by your health care provider..  Taking vitamin and mineral supplements as recommended by your health care provider. What happens during an annual well check? The services and screenings done by your health care provider during your annual well check will depend on your age, overall health, lifestyle risk factors, and family history of disease. Counseling  Your health care provider may ask you questions about your:  Alcohol use.  Tobacco use.  Drug use.  Emotional well-being.  Home and relationship well-being.  Sexual activity.  Eating habits.  History of falls.  Memory and ability to understand (cognition).  Work and work Astronomer. Screening  You may have the following tests or measurements:  Height, weight, and BMI.  Blood pressure.  Lipid and cholesterol levels. These may be checked every 5 years, or more frequently if you are over 34 years old.  Skin check.  Lung cancer screening. You may have this screening every year starting at age 3 if you have a 30-pack-year history of smoking and currently smoke or have quit within the past 15 years.  Fecal occult blood test (FOBT) of the stool. You may have this test every year starting at age 66.  Flexible sigmoidoscopy or colonoscopy. You may have a sigmoidoscopy every 5 years or a colonoscopy every 10 years starting at age 52.  Prostate cancer screening. Recommendations will vary depending on your family  history and other risks.  Hepatitis C blood test.  Hepatitis B blood test.  Sexually transmitted disease (STD) testing.  Diabetes screening. This is done by checking your blood sugar (glucose) after you have not eaten for a  while (fasting). You may have this done every 1-3 years.  Abdominal aortic aneurysm (AAA) screening. You may need this if you are a current or former smoker.  Osteoporosis. You may be screened starting at age 38 if you are at high risk. Talk with your health care provider about your test results, treatment options, and if necessary, the need for more tests. Vaccines  Your health care provider may recommend certain vaccines, such as:  Influenza vaccine. This is recommended every year.  Tetanus, diphtheria, and acellular pertussis (Tdap, Td) vaccine. You may need a Td booster every 10 years.  Zoster vaccine. You may need this after age 6.  Pneumococcal 13-valent conjugate (PCV13) vaccine. One dose is recommended after age 17.  Pneumococcal polysaccharide (PPSV23) vaccine. One dose is recommended after age 70. Talk to your health care provider about which screenings and vaccines you need and how often you need them. This information is not intended to replace advice given to you by your health care provider. Make sure you discuss any questions you have with your health care provider. Document Released: 06/12/2015 Document Revised: 02/03/2016 Document Reviewed: 03/17/2015 Elsevier Interactive Patient Education  2017 [place] Prevention in the Home Falls can cause injuries. They can happen to people of all ages. There are many things you can do to make your home safe and to help prevent falls. What can I do on the outside of my home?  Regularly fix the edges of walkways and driveways and fix any cracks.  Remove anything that might make you trip as you walk through a door, such as a raised step or threshold.  Trim any bushes or trees on the path to your home.  Use bright outdoor lighting.  Clear any walking paths of anything that might make someone trip, such as rocks or tools.  Regularly check to see if handrails are loose or broken. Make sure that both sides of any steps  have handrails.  Any raised decks and porches should have guardrails on the edges.  Have any leaves, snow, or ice cleared regularly.  Use sand or salt on walking paths during winter.  Clean up any spills in your garage right away. This includes oil or grease spills. What can I do in the bathroom?  Use night lights.  Install grab bars by the toilet and in the tub and shower. Do not use towel bars as grab bars.  Use non-skid mats or decals in the tub or shower.  If you need to sit down in the shower, use a plastic, non-slip stool.  Keep the floor dry. Clean up any water that spills on the floor as soon as it happens.  Remove soap buildup in the tub or shower regularly.  Attach bath mats securely with double-sided non-slip rug tape.  Do not have throw rugs and other things on the floor that can make you trip. What can I do in the bedroom?  Use night lights.  Make sure that you have a light by your bed that is easy to reach.  Do not use any sheets or blankets that are too big for your bed. They should not hang down onto the floor.  Have a firm chair that has side arms. You can use  this for support while you get dressed.  Do not have throw rugs and other things on the floor that can make you trip. What can I do in the kitchen?  Clean up any spills right away.  Avoid walking on wet floors.  Keep items that you use a lot in easy-to-reach places.  If you need to reach something above you, use a strong step stool that has a grab bar.  Keep electrical cords out of the way.  Do not use floor polish or wax that makes floors slippery. If you must use wax, use non-skid floor wax.  Do not have throw rugs and other things on the floor that can make you trip. What can I do with my stairs?  Do not leave any items on the stairs.  Make sure that there are handrails on both sides of the stairs and use them. Fix handrails that are broken or loose. Make sure that handrails are as  long as the stairways.  Check any carpeting to make sure that it is firmly attached to the stairs. Fix any carpet that is loose or worn.  Avoid having throw rugs at the top or bottom of the stairs. If you do have throw rugs, attach them to the floor with carpet tape.  Make sure that you have a light switch at the top of the stairs and the bottom of the stairs. If you do not have them, ask someone to add them for you. What else can I do to help prevent falls?  Wear shoes that:  Do not have high heels.  Have rubber bottoms.  Are comfortable and fit you well.  Are closed at the toe. Do not wear sandals.  If you use a stepladder:  Make sure that it is fully opened. Do not climb a closed stepladder.  Make sure that both sides of the stepladder are locked into place.  Ask someone to hold it for you, if possible.  Clearly mark and make sure that you can see:  Any grab bars or handrails.  First and last steps.  Where the edge of each step is.  Use tools that help you move around (mobility aids) if they are needed. These include:  Canes.  Walkers.  Scooters.  Crutches.  Turn on the lights when you go into a dark area. Replace any light bulbs as soon as they burn out.  Set up your furniture so you have a clear path. Avoid moving your furniture around.  If any of your floors are uneven, fix them.  If there are any pets around you, be aware of where they are.  Review your medicines with your doctor. Some medicines can make you feel dizzy. This can increase your chance of falling. Ask your doctor what other things that you can do to help prevent falls. This information is not intended to replace advice given to you by your health care provider. Make sure you discuss any questions you have with your health care provider. Document Released: 03/12/2009 Document Revised: 10/22/2015 Document Reviewed: 06/20/2014 Elsevier Interactive Patient Education  2017 [employer].

## 2019-06-11 NOTE — Progress Notes (Signed)
This visit is being conducted via phone call due to the COVID-19 pandemic. This patient has given me verbal consent via phone to conduct this visit, patient states they are participating from their home address. Some vital signs may be absent or patient reported.   Patient identification: identified by name, DOB, and current address.  Location provider: [facility], Office Persons participating in the virtual visit: Nathaniel Man, LPN. & Isaiah Wilson   Subjective:   Isaiah Wilson is a 85 y.o. male who presents for Medicare Annual/Subsequent preventive examination.  Isaiah Wilson reports he is staying busy with being primary caregiver to his wife. He has chronic pain in both knees and hips. He is able to ambulate and occasionally uses a cane for stability.He does not get any additional exercise and feels he eats healthy, balanced meals.   Review of Systems:        Objective:    Vitals: BP (!) 152/60   Ht 5\' 10"  (1.778 m)   Wt 225 lb 6.4 oz (102.2 kg)   BMI 32.34 kg/m   Body mass index is 32.34 kg/m.  Advanced Directives 06/11/2019 03/13/2019 03/07/2019 04/18/2018 04/12/2018 02/07/2018 12/07/2016  Does Patient Have a Medical Advance Directive? No Yes No No No Yes Yes  Type of Advance Directive - Healthcare Power of Attorney - - - - 02/07/2017;Living will  Does patient want to make changes to medical advance directive? - No - Patient declined - - - - No - Patient declined  Copy of Healthcare Power of Attorney in Chart? - No - copy requested - - - - No - copy requested  Would patient like information on creating a medical advance directive? No - Patient declined No - Patient declined No - Patient declined No - Patient declined No - Patient declined - -    Tobacco Social History   Tobacco Use  Smoking Status Never Smoker  Smokeless Tobacco Never Used     Counseling given: Not Answered   Clinical Intake:  Pre-visit preparation completed: Yes  Pain :  0-10 Pain Score: 7  Pain Type: Chronic pain Pain Location: Knee     BMI - recorded: 32.34 Nutritional Status: BMI > 30  Obese Nutritional Risks: None Diabetes: No  How often do you need to have someone help you when you read instructions, pamphlets, or other written materials from your doctor or pharmacy?: 1 - Never What is the last grade level you completed in school?: some college  Interpreter Needed?: No  Information entered by :: [email or web address], LPN  Past Medical History:  Diagnosis Date  . Abnormal EKG    hx left anterior fasicular block on old ekg 2017  . Allergic rhinitis   . Allergy   . Barrett's esophagus   . Benign prostatic hypertrophy   . Colitis, ischemic (HCC)    X 2  . Complication of anesthesia    SLOW TO AWAKEN AFTER 1 SURGERY 20  YRS AGO  . DJD (degenerative joint disease)    SPINE, AND OA  . GERD (gastroesophageal reflux disease)   . H/O hiatal hernia   . HOH (hard of hearing)    BOTH EARS  . HOH (hard of hearing)    BOTH EARS  . Hyperlipidemia    no medication per pt.  . Hypertension    pt denies says cardura for bladder  . Neuropathy     LEFT HAND AND ARM NUMB ALL THE TIME  . Overweight(278.02)  Past Surgical History:  Procedure Laterality Date  . 2 neck surgeries  2005-2006   Dr. Georgia Dom cx decompression, diskectomy,fusion C6-7 allograftWITH PLATE  . APPENDECTOMY    . EYE SURGERY     bilateral cataract surgerywith lens implant  . FOOT SURGERY     right x 3  . LAPAROSCOPIC CHOLECYSTECTOMY  1993   Dr. Luan Moore  . left total knee replacement  07/2007   Dr. Eulah Pont   . right total knee replacement  2012   Dr. Despina Hick  . SEPTOPLASTY  1975, May 2013   x3  . TONSILLECTOMY  as child  . TOTAL HIP ARTHROPLASTY Left 04/18/2018   Procedure: LEFT TOTAL HIP ARTHROPLASTY ANTERIOR APPROACH;  Surgeon: Ollen Gross, MD;  Location: [facility];  Service: Orthopedics;  Laterality: Left;  . TOTAL HIP ARTHROPLASTY Right 03/13/2019   Procedure:  TOTAL HIP ARTHROPLASTY ANTERIOR APPROACH;  Surgeon: Ollen Gross, MD;  Location: [facility];  Service: Orthopedics;  Laterality: Right;   . TOTAL KNEE REVISION Left 11/11/2015   Procedure: LEFT KNEE POLYETHELENE REVISION;  Surgeon: Ollen Gross, MD;  Location: [facility];  Service: Orthopedics;  Laterality: Left;  . TOTAL KNEE REVISION Left 12/07/2016   Procedure: LEFT TOTAL KNEE REVISION;  Surgeon: Ollen Gross, MD;  Location: [facility];  Service: Orthopedics;  Laterality: Left;  Adductor Block   Family History  Problem Relation Age of Onset  . Leukemia Mother   . Heart disease Father   . Heart disease Sister   . Colon cancer Neg Hx   . Stomach cancer Neg Hx   . Rectal cancer Neg Hx   . Esophageal cancer Neg Hx    Social History   Socioeconomic History  . Marital status: Married    Spouse name: Not on file  . Number of children: 2  . Years of education: some college  . Highest education level: Not on file  Occupational History  . Occupation: retired  Tobacco Use  . Smoking status: Never Smoker  . Smokeless tobacco: Never Used  Substance and Sexual Activity  . Alcohol use: No    Alcohol/week: 0.0 standard drinks  . Drug use: No  . Sexual activity: Not on file  Other Topics Concern  . Not on file  Social History Narrative   Married   HH 2   2 children; 3 granchildren; 2 great grandchildren   Social Determinants of Health   Financial Resource Strain: Low Risk   . Difficulty of Paying Living Expenses: Not hard at all  Food Insecurity: No Food Insecurity  . Worried About Programme researcher, broadcasting/film/video in the Last Year: Never true  . Ran Out of Food in the Last Year: Never true  Transportation Needs: No Transportation Needs  . Lack of Transportation (Medical): No  . Lack of Transportation (Non-Medical): No  Physical Activity:   . Days of Exercise per Week: Not on file  . Minutes of Exercise per Session: Not on file  Stress: No Stress Concern Present  . Feeling of Stress : Only a  little  Social Connections: Not Isolated  . Frequency of Communication with Friends and Family: More than three times a week  . Frequency of Social Gatherings with Friends and Family: Once a week  . Attends Religious Services: More than 4 times per year  . Active Member of Clubs or Organizations: Yes  . Attends Banker Meetings: More than 4 times per year  . Marital Status: Married    Outpatient Encounter  Medications as of 06/11/2019  Medication Sig  . aspirin EC 325 MG EC tablet Take 1 tablet (325 mg total) by mouth 2 (two) times daily.  Marland Kitchen azelastine (ASTELIN) 0.1 % nasal spray Place 1 spray into both nostrils 2 (two) times daily as needed (allergies.).   Marland Kitchen EPINEPHrine 0.3 mg/0.3 mL IJ SOAJ injection Inject 0.3 mg into the muscle as needed (allergic reaction).   Marland Kitchen loratadine (CLARITIN) 10 MG tablet Take 10 mg by mouth daily.   . montelukast (SINGULAIR) 10 MG tablet TAKE ONE TABLET BY MOUTH EVERY NIGHT AT BEDTIME  . NEXIUM 40 MG capsule TAKE ONE CAPSULE BY MOUTH EVERY EVENING BEFORE SUPPER  . doxazosin (CARDURA) 8 MG tablet TAKE ONE TABLET BY MOUTH EVERY NIGHT AT BEDTIME (Patient not taking: Reported on 06/11/2019)  . fluticasone (FLONASE) 50 MCG/ACT nasal spray Place 2 sprays into both nostrils daily.   . methocarbamol (ROBAXIN) 500 MG tablet Take 1 tablet (500 mg total) by mouth every 6 (six) hours as needed for muscle spasms. (Patient not taking: Reported on 06/11/2019)  . nystatin cream (MYCOSTATIN) Apply 1 application topically 2 (two) times daily. (Patient not taking: Reported on 06/11/2019)  . Polyethyl Glycol-Propyl Glycol (LUBRICANT EYE DROPS) 0.4-0.3 % SOLN Place 1 drop into both eyes 3 (three) times daily as needed (DRY/IRRITATED EYES.).   No facility-administered encounter medications on file as of 06/11/2019.    Activities of Daily Living In your present state of health, do you have any difficulty performing the following activities: 03/13/2019 03/07/2019  Hearing? N  N  Vision? N N  Difficulty concentrating or making decisions? N N  Walking or climbing stairs? Y Y  Dressing or bathing? N Y  Comment - has problems getting self dressed due to hip pain  Doing errands, shopping? N N  Some recent data might be hidden    Patient Care Team: [name], MD as PCP - General (Family Medicine)   Assessment:   This is a routine wellness examination for Isaiah Wilson.  Exercise Activities and Dietary recommendations    Goals    . Patient Stated     Continue current state of health and live healthy  Less joint pain        Fall Risk Fall Risk  06/11/2019 05/19/2019 02/07/2018 11/22/2017 11/16/2016  Falls in the past year? 0 0 No No No  Number falls in past yr: - 0 - - -  Risk for fall due to : Medication side effect Orthopedic patient - - -  Follow up Falls prevention discussed;Education provided;Falls evaluation completed - - - -   Is the patient's home free of loose throw rugs in walkways, pet beds, electrical cords, etc?   yes      Grab bars in the bathroom? yes      Handrails on the stairs?   yes      Adequate lighting?   yes  Timed Get Up and Go Performed: N/A due to telephone visit  Depression Screen PHQ 2/9 Scores 06/11/2019 05/19/2019 02/07/2018 11/22/2017  PHQ - 2 Score 0 1 0 0    Cognitive Function MMSE - Mini Mental State Exam 02/07/2018  Not completed: (No Data)     6CIT Screen 06/11/2019  What Year? 0 points  What month? 0 points  What time? 0 points  Count back from 20 0 points  Months in reverse 0 points  Repeat phrase 0 points  Total Score 0    Immunization History  Administered Date(s) Administered  .  H1N1 07/03/2008  . Influenza Split 03/04/2011, 02/07/2012  . Influenza Whole 03/27/2007, 02/27/2008, 03/13/2009  . Influenza, High Dose Seasonal PF 04/09/2015, 03/01/2016, 03/06/2017, 02/07/2018, 02/28/2019  . Influenza,inj,Quad PF,6+ Mos 02/08/2013, 02/17/2014  . Influenza-Unspecified 04/05/2017  . Pneumococcal  Conjugate-13 02/17/2014  . Pneumococcal Polysaccharide-23 11/16/2016  . Td 06/22/2009  . Zoster 02/23/2018  . Zoster Recombinat (Shingrix) 11/24/2017, 02/23/2018    Qualifies for Shingles Vaccine? Yes; patient has completed shingrix.   Screening Tests Health Maintenance  Topic Date Due  . TETANUS/TDAP  06/23/2019  . INFLUENZA VACCINE  Completed  . PNA vac Low Risk Adult  Completed   Cancer Screenings: Lung: Low Dose CT Chest recommended if Age 31-80 years, 30 pack-year currently smoking OR have quit w/in 15years. Patient does not qualify. Colorectal: yes but is  No longer necessary due to advanced age.   Additional Screenings:  Hepatitis C Screening:N/A due to age.       Plan:   Isaiah Wilson is up to date with preventive health screenings and immunizations. He will need an updated Tdap this month. Explained to him that Medicare only covers this in the event of an injury. He may choose to obtain and pay out of pocket however. He verbalizes understanding.  I have personally reviewed and noted the following in the patient's chart:   . Medical and social history . Use of alcohol, tobacco or illicit drugs  . Current medications and supplements . Functional ability and status . Nutritional status . Physical activity . Advanced directives . List of other physicians . Hospitalizations, surgeries, and ER visits in previous 12 months . Vitals . Screenings to include cognitive, depression, and falls . Referrals and appointments  In addition, I have reviewed and discussed with patient certain preventive protocols, quality metrics, and best practice recommendations. A written personalized care plan for preventive services as well as general preventive health recommendations were provided to patient.     Nathaniel Man, LPN  [id number]/99/3716

## 2019-06-16 ENCOUNTER — Other Ambulatory Visit: Payer: Self-pay | Admitting: Family Medicine

## 2019-06-17 ENCOUNTER — Telehealth: Payer: Self-pay | Admitting: Family Medicine

## 2019-06-17 NOTE — Telephone Encounter (Signed)
Message Routed to PCP CMA 

## 2019-06-17 NOTE — Telephone Encounter (Signed)
Called patient and let him know that I have the letter and will fax to [facility] at [phone] today. Patient verbalized an understanding.

## 2019-06-17 NOTE — Telephone Encounter (Signed)
Pt sent in a letter due to his insurance company no longer paying for his NEXIUM 40 MG capsule /Pt hasnt heard from office and would like a call from the nurse about how he can stay on this medication

## 2019-06-25 DIAGNOSIS — J301 Allergic rhinitis due to pollen: Secondary | ICD-10-CM | POA: Diagnosis not present

## 2019-06-27 DIAGNOSIS — H6983 Other specified disorders of Eustachian tube, bilateral: Secondary | ICD-10-CM | POA: Diagnosis not present

## 2019-06-27 DIAGNOSIS — H903 Sensorineural hearing loss, bilateral: Secondary | ICD-10-CM | POA: Diagnosis not present

## 2019-06-27 DIAGNOSIS — Z79899 Other long term (current) drug therapy: Secondary | ICD-10-CM | POA: Diagnosis not present

## 2019-07-02 DIAGNOSIS — J301 Allergic rhinitis due to pollen: Secondary | ICD-10-CM | POA: Diagnosis not present

## 2019-07-08 NOTE — Telephone Encounter (Signed)
Received approval from [facility] for Nexium 40 mg capsule from: 05/31/2019 through 05/29/2020.

## 2019-07-15 ENCOUNTER — Other Ambulatory Visit: Payer: Self-pay | Admitting: Family Medicine

## 2019-07-16 DIAGNOSIS — J301 Allergic rhinitis due to pollen: Secondary | ICD-10-CM | POA: Diagnosis not present

## 2019-07-31 ENCOUNTER — Other Ambulatory Visit: Payer: Self-pay | Admitting: Family Medicine

## 2019-08-07 DIAGNOSIS — J301 Allergic rhinitis due to pollen: Secondary | ICD-10-CM | POA: Diagnosis not present

## 2019-08-08 DIAGNOSIS — J3089 Other allergic rhinitis: Secondary | ICD-10-CM | POA: Diagnosis not present

## 2019-08-08 DIAGNOSIS — J301 Allergic rhinitis due to pollen: Secondary | ICD-10-CM | POA: Diagnosis not present

## 2019-08-08 DIAGNOSIS — H1045 Other chronic allergic conjunctivitis: Secondary | ICD-10-CM | POA: Diagnosis not present

## 2019-08-14 DIAGNOSIS — H903 Sensorineural hearing loss, bilateral: Secondary | ICD-10-CM | POA: Diagnosis not present

## 2019-08-15 DIAGNOSIS — J301 Allergic rhinitis due to pollen: Secondary | ICD-10-CM | POA: Diagnosis not present

## 2019-08-18 ENCOUNTER — Other Ambulatory Visit: Payer: Self-pay

## 2019-08-18 ENCOUNTER — Encounter (HOSPITAL_COMMUNITY): Payer: Self-pay | Admitting: Emergency Medicine

## 2019-08-18 ENCOUNTER — Emergency Department (HOSPITAL_COMMUNITY)
Admission: EM | Admit: 2019-08-18 | Discharge: 2019-08-18 | Disposition: A | Discharge status: Home / Self Care | Payer: Medicare PPO | Attending: Emergency Medicine | Admitting: Emergency Medicine

## 2019-08-18 ENCOUNTER — Emergency Department (HOSPITAL_COMMUNITY): Payer: Medicare PPO

## 2019-08-18 DIAGNOSIS — I160 Hypertensive urgency: Secondary | ICD-10-CM | POA: Insufficient documentation

## 2019-08-18 DIAGNOSIS — Z96643 Presence of artificial hip joint, bilateral: Secondary | ICD-10-CM | POA: Diagnosis not present

## 2019-08-18 DIAGNOSIS — Z79899 Other long term (current) drug therapy: Secondary | ICD-10-CM | POA: Diagnosis not present

## 2019-08-18 DIAGNOSIS — I10 Essential (primary) hypertension: Secondary | ICD-10-CM | POA: Diagnosis present

## 2019-08-18 DIAGNOSIS — H539 Unspecified visual disturbance: Secondary | ICD-10-CM | POA: Diagnosis not present

## 2019-08-18 LAB — COMPREHENSIVE METABOLIC PANEL
ALT: 18 U/L (ref 0–44)
AST: 23 U/L (ref 15–41)
Albumin: 4.2 g/dL (ref 3.5–5.0)
Alkaline Phosphatase: 59 U/L (ref 38–126)
Anion gap: 9 (ref 5–15)
BUN: 12 mg/dL (ref 8–23)
CO2: 26 mmol/L (ref 22–32)
Calcium: 9 mg/dL (ref 8.9–10.3)
Chloride: 105 mmol/L (ref 98–111)
Creatinine, Ser: 0.98 mg/dL (ref 0.61–1.24)
GFR calc Af Amer: >60 mL/min (ref >60)
GFR calc non Af Amer: >60 mL/min (ref >60)
Glucose, Bld: 102 mg/dL — ABNORMAL HIGH (ref 70–99)
Potassium: 4 mmol/L (ref 3.5–5.1)
Sodium: 140 mmol/L (ref 135–145)
Total Bilirubin: 0.7 mg/dL (ref 0.3–1.2)
Total Protein: 7.4 g/dL (ref 6.5–8.1)

## 2019-08-18 LAB — CBC WITH DIFFERENTIAL/PLATELET
Abs Immature Granulocytes: 0.03 10*3/uL (ref 0.00–0.07)
Basophils Absolute: 0.1 10*3/uL (ref 0.0–0.1)
Basophils Relative: 1 %
Eosinophils Absolute: 0.2 10*3/uL (ref 0.0–0.5)
Eosinophils Relative: 3 %
HCT: 40.8 % (ref 39.0–52.0)
Hemoglobin: 13.2 g/dL (ref 13.0–17.0)
Immature Granulocytes: 0 %
Lymphocytes Relative: 33 %
Lymphs Abs: 2.7 10*3/uL (ref 0.7–4.0)
MCH: 28.4 pg (ref 26.0–34.0)
MCHC: 32.4 g/dL (ref 30.0–36.0)
MCV: 87.7 fL (ref 80.0–100.0)
Monocytes Absolute: 0.8 10*3/uL (ref 0.1–1.0)
Monocytes Relative: 9 %
Neutro Abs: 4.5 10*3/uL (ref 1.7–7.7)
Neutrophils Relative %: 54 %
Platelets: 188 10*3/uL (ref 150–400)
RBC: 4.65 MIL/uL (ref 4.22–5.81)
RDW: 14.8 % (ref 11.5–15.5)
WBC: 8.2 10*3/uL (ref 4.0–10.5)
nRBC: 0 % (ref 0.0–0.2)

## 2019-08-18 LAB — CBG MONITORING, ED: Glucose-Capillary: 104 mg/dL — ABNORMAL HIGH (ref 70–99)

## 2019-08-18 MED ORDER — LORAZEPAM 2 MG/ML IJ SOLN
0.5000 mg | Freq: Once | INTRAMUSCULAR | Status: AC | PRN
  Administered 2019-08-18: 0.5 mg via INTRAVENOUS
  Filled 2019-08-18: qty 1

## 2019-08-18 MED ORDER — AMLODIPINE BESYLATE 5 MG PO TABS
5.0000 mg | ORAL_TABLET | Freq: Once | ORAL | Status: AC
  Administered 2019-08-18: 5 mg via ORAL
  Filled 2019-08-18: qty 1

## 2019-08-18 NOTE — Discharge Instructions (Signed)
If you develop severe headache, trouble speaking, chest pain, shortness of breath, dizziness, vision changes, weakness or numbness in your extremities, or any other new/concerning symptoms then return to the ER for evaluation.  Otherwise, your blood pressure was elevated today needed to call your doctor tomorrow for outpatient follow-up and medications.

## 2019-08-18 NOTE — ED Triage Notes (Signed)
Patient reports monitoring BP x3 days at home with high readings. Denies HTN history. Denies chest pain, SOB, headache, weakness. 170/77 in triage.

## 2019-08-18 NOTE — ED Notes (Signed)
Isaiah Wilson, MRI tech coming in for pt, called at 18:20.

## 2019-08-18 NOTE — ED Provider Notes (Signed)
[facility]-EMERGENCY DEPT Provider Note   CSN: [id number] Arrival date & time: 08/18/19  1731     History Chief Complaint  Patient presents with  . Hypertension    Isaiah Wilson is a 85 y.o. male.  HPI  85 year old male presents with hypertension.  Has been feeling dizzy and off balance for about 3 days.  Over the last 2 days has checked his blood pressure multiple times per day and it is frequently around 200/90.  Has never had any prior history of hypertension and is not on meds specifically for hypertension.  He has a mild headache this feels like a prior sinus headache and is annoying but not severe.  No chest pain or shortness of breath.  No urinary changes.  No focal weakness and has chronic left arm numbness from cervical disc disease that is unchanged.  Past Medical History:  Diagnosis Date  . Abnormal EKG    hx left anterior fasicular block on old ekg 2017  . Allergic rhinitis   . Allergy   . Barrett's esophagus   . Benign prostatic hypertrophy   . Colitis, ischemic (HCC)    X 2  . Complication of anesthesia    SLOW TO AWAKEN AFTER 1 SURGERY 20  YRS AGO  . DJD (degenerative joint disease)    SPINE, AND OA  . GERD (gastroesophageal reflux disease)   . H/O hiatal hernia   . HOH (hard of hearing)    BOTH EARS  . HOH (hard of hearing)    BOTH EARS  . Hyperlipidemia    no medication per pt.  . Hypertension    pt denies says cardura for bladder  . Neuropathy     LEFT HAND AND ARM NUMB ALL THE TIME  . Overweight(278.02)     Patient Active Problem List   Diagnosis Date Noted  . OA (osteoarthritis) of hip 04/18/2018  . Acute suppurative otitis media of left ear without spontaneous rupture of tympanic membrane 03/06/2018  . Aftercare 12/15/2017  . Acute serous otitis media of left ear 06/20/2017  . Conductive hearing loss of both ears 06/20/2017  . History of total bilateral knee replacement 06/18/2017  . History of total knee  replacement, right 06/13/2017  . Pain in left knee 05/31/2017  . OA (osteoarthritis) of knee 12/07/2016  . 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 Eustachian tube dysfunction, bilateral 07/15/2015  . Suppurative otitis media of right ear without rupture of tympanic membrane 07/15/2015  . Obesity (BMI 30-39.9) 02/17/2014  . Seborrheic keratoses 02/08/2013  . Paresthesia 02/09/2011  . OVERWEIGHT 07/05/2008  . HIATAL HERNIA 09/25/2007  . Benign prostatic hyperplasia 07/14/2007  . ISCHEMIC COLITIS 07/12/2007  . HYPERLIPIDEMIA 05/07/2007  . ANXIETY 05/07/2007  . NEUROPATHY 05/07/2007  . ALLERGIC RHINITIS 05/07/2007  . GERD 05/07/2007  . BARRETTS ESOPHAGUS 05/07/2007  . Osteoarthritis 05/07/2007    Past Surgical History:  Procedure Laterality Date  . 2 neck surgeries  2005-2006   Dr. Georgia Dom cx decompression, diskectomy,fusion C6-7 allograftWITH PLATE  . APPENDECTOMY    . EYE SURGERY     bilateral cataract surgerywith lens implant  . FOOT SURGERY     right x 3  . LAPAROSCOPIC CHOLECYSTECTOMY  1993   Dr. Luan Moore  . left total knee replacement  07/2007   Dr. Eulah Pont   . right total knee replacement  2012   Dr. Despina Hick  . SEPTOPLASTY  1157, May 2013  x3  . TONSILLECTOMY  as child  . TOTAL HIP ARTHROPLASTY Left 04/18/2018   Procedure: LEFT TOTAL HIP ARTHROPLASTY ANTERIOR APPROACH;  Surgeon: Gaynelle Arabian, MD;  Location: [facility];  Service: Orthopedics;  Laterality: Left;  . TOTAL HIP ARTHROPLASTY Right 03/13/2019   Procedure: TOTAL HIP ARTHROPLASTY ANTERIOR APPROACH;  Surgeon: Gaynelle Arabian, MD;  Location: [facility];  Service: Orthopedics;  Laterality: Right;  156min  . TOTAL KNEE REVISION Left 11/11/2015   Procedure: LEFT KNEE POLYETHELENE REVISION;  Surgeon: Gaynelle Arabian, MD;  Location: [facility];  Service: Orthopedics;  Laterality: Left;  . TOTAL KNEE REVISION Left 12/07/2016   Procedure: LEFT TOTAL KNEE REVISION;  Surgeon:  Gaynelle Arabian, MD;  Location: [facility];  Service: Orthopedics;  Laterality: Left;  Adductor Block       Family History  Problem Relation Age of Onset  . Leukemia Mother   . Heart disease Father   . Heart disease Sister   . Colon cancer Neg Hx   . Stomach cancer Neg Hx   . Rectal cancer Neg Hx   . Esophageal cancer Neg Hx     Social History   Tobacco Use  . Smoking status: Never Smoker  . Smokeless tobacco: Never Used  Substance Use Topics  . Alcohol use: No    Alcohol/week: 0.0 standard drinks  . Drug use: No    Home Medications Prior to Admission medications   Medication Sig Start Date End Date Taking? Authorizing Provider  aspirin EC 325 MG EC tablet Take 1 tablet (325 mg total) by mouth 2 (two) times daily. 03/14/19  Yes Constable, Amber, PA-C  azelastine (ASTELIN) 0.1 % nasal spray Place 1 spray into both nostrils 2 (two) times daily as needed (allergies.).    Yes [provider]  doxazosin (CARDURA) 8 MG tablet TAKE ONE TABLET BY MOUTH EVERY NIGHT AT BEDTIME Patient taking differently: Take 8 mg by mouth at bedtime.  08/01/19  Yes Burchette, Alinda Sierras, MD  EPINEPHrine 0.3 mg/0.3 mL IJ SOAJ injection Inject 0.3 mg into the muscle as needed (allergic reaction).    Yes [provider]  fluticasone (FLONASE) 50 MCG/ACT nasal spray Place 2 sprays into both nostrils daily.    Yes [provider]  montelukast (SINGULAIR) 10 MG tablet TAKE ONE TABLET BY MOUTH EVERY NIGHT AT BEDTIME Patient taking differently: Take 10 mg by mouth at bedtime.  05/10/19  Yes Burchette, Alinda Sierras, MD  Multiple Vitamins-Minerals (CENTRUM ADULTS) TABS Take 1 tablet by mouth daily.   Yes [provider]  NEXIUM 40 MG capsule TAKE ONE CAPSULE BY MOUTH EVERY EVENING BEFORE SUPPER Patient taking differently: Take 40 mg by mouth daily with supper.  07/15/19  Yes Burchette, Alinda Sierras, MD  OVER THE COUNTER MEDICATION Take 30 mLs by mouth at bedtime. NightTime Cold and Flu from  [employer]   Yes [provider]  methocarbamol (ROBAXIN) 500 MG tablet Take 1 tablet (500 mg total) by mouth every 6 (six) hours as needed for muscle spasms. Patient not taking: Reported on 06/11/2019 03/14/19   Ardeen Jourdain, PA-C  nystatin cream (MYCOSTATIN) Apply 1 application topically 2 (two) times daily. Patient not taking: Reported on 06/11/2019 05/16/18   [name], MD    Allergies    Patient has no known allergies.  Review of Systems   Review of Systems  Constitutional: Negative for fever.  Eyes: Negative for visual disturbance.  Respiratory: Negative for shortness of breath.   Cardiovascular: Negative for chest pain.  Neurological: Positive for dizziness and headaches. Negative for weakness and numbness.  All other systems reviewed and are negative.   Physical Exam Updated Vital Signs BP (!) 166/91   Pulse (!) 58   Temp 98 F (36.7 C) (Oral)   Resp 19   SpO2 96%   Physical Exam Vitals and nursing note reviewed.  Constitutional:      General: He is not in acute distress.    Appearance: He is well-developed. He is not ill-appearing or diaphoretic.  HENT:     Head: Normocephalic and atraumatic.     Right Ear: External ear normal.     Left Ear: External ear normal.     Nose: Nose normal.  Eyes:     General:        Right eye: No discharge.        Left eye: No discharge.     Extraocular Movements: Extraocular movements intact.     Pupils: Pupils are equal, round, and reactive to light.  Cardiovascular:     Rate and Rhythm: Normal rate and regular rhythm.     Heart sounds: Normal heart sounds.  Pulmonary:     Effort: Pulmonary effort is normal.     Breath sounds: Normal breath sounds.  Abdominal:     Palpations: Abdomen is soft.     Tenderness: There is no abdominal tenderness.  Musculoskeletal:     Cervical back: Neck supple.  Skin:    General: Skin is warm and dry.  Neurological:     Mental Status: He is alert.     Comments: CN  3-12 grossly intact. 5/5 strength in all 4 extremities. Grossly normal sensation. Normal finger to nose.   Psychiatric:        Mood and Affect: Mood is not anxious.     ED Results / Procedures / Treatments   Labs (all labs ordered are listed, but only abnormal results are displayed) Labs Reviewed  COMPREHENSIVE METABOLIC PANEL - Abnormal; Notable for the following components:      Result Value   Glucose, Bld 102 (*)    All other components within normal limits  CBG MONITORING, ED - Abnormal; Notable for the following components:   Glucose-Capillary 104 (*)    All other components within normal limits  CBC WITH DIFFERENTIAL/PLATELET    EKG EKG Interpretation  Date/Time:  Sunday August 18 2019 18:41:00 EDT Ventricular Rate:  57 PR Interval:    QRS Duration: 104 QT Interval:  458 QTC Calculation: 446 R Axis:   -60 Text Interpretation: Sinus rhythm Supraventricular bigeminy Prolonged PR interval Incomplete RBBB and LAFB RSR' in V1 or V2, right VCD or RVH Consider anterior infarct no significant change since Oct 2020 Confirmed by Pricilla Loveless [phone]) on 08/18/2019 7:35:20 PM   Radiology MR BRAIN WO CONTRAST  Result Date: 08/18/2019 CLINICAL DATA:  Visual changes, now resolved EXAM: MRI HEAD WITHOUT CONTRAST TECHNIQUE: Multiplanar, multiecho pulse sequences of the brain and surrounding structures were obtained without intravenous contrast. COMPARISON:  None. FINDINGS: Brain: There is no acute infarction or intracranial hemorrhage. There is no intracranial mass, mass effect, or edema. There is no hydrocephalus or extra-axial fluid collection. Ventricles and sulci are in size and configuration. Punctate focus of susceptibility at the anterior left lentiform nucleus likely reflects chronic microhemorrhage. Patchy foci of T2 hyperintensity in the supratentorial white matter are nonspecific but may reflect minor chronic microvascular ischemic changes. Vascular: Major vessel flow voids at  the skull base are preserved. Skull and upper  cervical spine: Normal marrow signal is preserved. Sinuses/Orbits: Patchy mucosal thickening and chronic left posterior ethmoid opacification. Bilateral lens replacements. Other: Sella is unremarkable. Minimal patchy bilateral mastoid tip fluid opacification. IMPRESSION: No evidence of recent infarction, hemorrhage, or mass. Minor chronic microvascular ischemic changes. Electronically Signed   By: Guadlupe Spanish M.D.   On: 08/18/2019 20:18    Procedures Procedures (including critical care time)  Medications Ordered in ED Medications  LORazepam (ATIVAN) injection 0.5 mg (0.5 mg Intravenous Given 08/18/19 1918)  amLODipine (NORVASC) tablet 5 mg (5 mg Oral Given 08/18/19 2152)    ED Course  I have reviewed the triage vital signs and the nursing notes.  Pertinent labs & imaging results that were available during my care of the patient were reviewed by me and considered in my medical decision making (see chart for details).    MDM Rules/Calculators/A&P                      Patient's neuro exam is unremarkable and he ambulates okay.  However given his age, significant hypertension, and history of unsteady walking during these last couple days, was worked up for acute stroke.  MRI is negative.  Blood pressure has gradually improved.  Probably symptomatic hypertension but no endorgan damage seen.  Discussed options as far as blood pressure control and he prefers to get a dose of blood pressure medicine now and call his doctor for outpatient blood pressure control.  I think is pretty reasonable.  Discussed return precautions. Final Clinical Impression(s) / ED Diagnoses Final diagnoses:  Hypertensive urgency    Rx / DC Orders ED Discharge Orders    None       Pricilla Loveless, MD 08/19/19 [phone]

## 2019-08-19 ENCOUNTER — Ambulatory Visit (INDEPENDENT_AMBULATORY_CARE_PROVIDER_SITE_OTHER): Payer: Medicare PPO | Admitting: Family Medicine

## 2019-08-19 ENCOUNTER — Other Ambulatory Visit: Payer: Self-pay

## 2019-08-19 ENCOUNTER — Encounter: Payer: Self-pay | Admitting: Family Medicine

## 2019-08-19 VITALS — BP 138/82 | HR 66 | Temp 97.9°F | Wt 247.3 lb

## 2019-08-19 DIAGNOSIS — R42 Dizziness and giddiness: Secondary | ICD-10-CM

## 2019-08-19 DIAGNOSIS — I10 Essential (primary) hypertension: Secondary | ICD-10-CM | POA: Diagnosis not present

## 2019-08-19 MED ORDER — LOSARTAN POTASSIUM 50 MG PO TABS: 50.0000 mg | ORAL_TABLET | Freq: Every day | ORAL | 11 refills | Status: DC

## 2019-08-19 NOTE — Patient Instructions (Addendum)
Stop taking Nyquil (generic) Start Losartan 50 mg once every morning Follow-up in 1 month and we will recheck kidney function at that visit   Hypertension, Adult High blood pressure (hypertension) is when the force of blood pumping through the arteries is too strong. The arteries are the blood vessels that carry blood from the heart throughout the body. Hypertension forces the heart to work harder to pump blood and may cause arteries to become narrow or stiff. Untreated or uncontrolled hypertension can cause a heart attack, heart failure, a stroke, kidney disease, and other problems. A blood pressure reading consists of a higher number over a lower number. Ideally, your blood pressure should be below 120/80. The first ("top") number is called the systolic pressure. It is a measure of the pressure in your arteries as your heart beats. The second ("bottom") number is called the diastolic pressure. It is a measure of the pressure in your arteries as the heart relaxes. What are the causes? The exact cause of this condition is not known. There are some conditions that result in or are related to high blood pressure. What increases the risk? Some risk factors for high blood pressure are under your control. The following factors may make you more likely to develop this condition:  Smoking.  Having type 2 diabetes mellitus, high cholesterol, or both.  Not getting enough exercise or physical activity.  Being overweight.  Having too much fat, sugar, calories, or salt (sodium) in your diet.  Drinking too much alcohol. Some risk factors for high blood pressure may be difficult or impossible to change. Some of these factors include:  Having chronic kidney disease.  Having a family history of high blood pressure.  Age. Risk increases with age.  Race. You may be at higher risk if you are African American.  Gender. Men are at higher risk than women before age 56. After age 13, women are at higher  risk than men.  Having obstructive sleep apnea.  Stress. What are the signs or symptoms? High blood pressure may not cause symptoms. Very high blood pressure (hypertensive crisis) may cause:  Headache.  Anxiety.  Shortness of breath.  Nosebleed.  Nausea and vomiting.  Vision changes.  Severe chest pain.  Seizures. How is this diagnosed? This condition is diagnosed by measuring your blood pressure while you are seated, with your arm resting on a flat surface, your legs uncrossed, and your feet flat on the floor. The cuff of the blood pressure monitor will be placed directly against the skin of your upper arm at the level of your heart. It should be measured at least twice using the same arm. Certain conditions can cause a difference in blood pressure between your right and left arms. Certain factors can cause blood pressure readings to be lower or higher than normal for a short period of time:  When your blood pressure is higher when you are in a health care provider's office than when you are at home, this is called white coat hypertension. Most people with this condition do not need medicines.  When your blood pressure is higher at home than when you are in a health care provider's office, this is called masked hypertension. Most people with this condition may need medicines to control blood pressure. If you have a high blood pressure reading during one visit or you have normal blood pressure with other risk factors, you may be asked to:  Return on a different day to have your blood pressure  checked again.  Monitor your blood pressure at home for 1 week or longer. If you are diagnosed with hypertension, you may have other blood or imaging tests to help your health care provider understand your overall risk for other conditions. How is this treated? This condition is treated by making healthy lifestyle changes, such as eating healthy foods, exercising more, and reducing your  alcohol intake. Your health care provider may prescribe medicine if lifestyle changes are not enough to get your blood pressure under control, and if:  Your systolic blood pressure is above 130.  Your diastolic blood pressure is above 80. Your personal target blood pressure may vary depending on your medical conditions, your age, and other factors. Follow these instructions at home: Eating and drinking   Eat a diet that is high in fiber and potassium, and low in sodium, added sugar, and fat. An example eating plan is called the DASH (Dietary Approaches to Stop Hypertension) diet. To eat this way: ? Eat plenty of fresh fruits and vegetables. Try to fill one half of your plate at each meal with fruits and vegetables. ? Eat whole grains, such as whole-wheat pasta, brown rice, or whole-grain bread. Fill about one fourth of your plate with whole grains. ? Eat or drink low-fat dairy products, such as skim milk or low-fat yogurt. ? Avoid fatty cuts of meat, processed or cured meats, and poultry with skin. Fill about one fourth of your plate with lean proteins, such as fish, chicken without skin, beans, eggs, or tofu. ? Avoid pre-made and processed foods. These tend to be higher in sodium, added sugar, and fat.  Reduce your daily sodium intake. Most people with hypertension should eat less than 1,500 mg of sodium a day.  Do not drink alcohol if: ? Your health care provider tells you not to drink. ? You are pregnant, may be pregnant, or are planning to become pregnant.  If you drink alcohol: ? Limit how much you use to:  0-1 drink a day for women.  0-2 drinks a day for men. ? Be aware of how much alcohol is in your drink. In the [place] one drink equals one 12 oz bottle of beer (355 mL), one 5 oz glass of wine (148 mL), or one 1 oz glass of hard liquor (44 mL). Lifestyle   Work with your health care provider to maintain a healthy body weight or to lose weight. Ask what an ideal weight is for  you.  Get at least 30 minutes of exercise most days of the week. Activities may include walking, swimming, or biking.  Include exercise to strengthen your muscles (resistance exercise), such as Pilates or lifting weights, as part of your weekly exercise routine. Try to do these types of exercises for 30 minutes at least 3 days a week.  Do not use any products that contain nicotine or tobacco, such as cigarettes, e-cigarettes, and chewing tobacco. If you need help quitting, ask your health care provider.  Monitor your blood pressure at home as told by your health care provider.  Keep all follow-up visits as told by your health care provider. This is important. Medicines  Take over-the-counter and prescription medicines only as told by your health care provider. Follow directions carefully. Blood pressure medicines must be taken as prescribed.  Do not skip doses of blood pressure medicine. Doing this puts you at risk for problems and can make the medicine less effective.  Ask your health care provider about side effects or  reactions to medicines that you should watch for. Contact a health care provider if you:  Think you are having a reaction to a medicine you are taking.  Have headaches that keep coming back (recurring).  Feel dizzy.  Have swelling in your ankles.  Have trouble with your vision. Get help right away if you:  Develop a severe headache or confusion.  Have unusual weakness or numbness.  Feel faint.  Have severe pain in your chest or abdomen.  Vomit repeatedly.  Have trouble breathing. Summary  Hypertension is when the force of blood pumping through your arteries is too strong. If this condition is not controlled, it may put you at risk for serious complications.  Your personal target blood pressure may vary depending on your medical conditions, your age, and other factors. For most people, a normal blood pressure is less than 120/80.  Hypertension is  treated with lifestyle changes, medicines, or a combination of both. Lifestyle changes include losing weight, eating a healthy, low-sodium diet, exercising more, and limiting alcohol. This information is not intended to replace advice given to you by your health care provider. Make sure you discuss any questions you have with your health care provider. Document Revised: 01/24/2018 Document Reviewed: 01/24/2018 Elsevier Patient Education  2020 [employer].

## 2019-08-19 NOTE — Progress Notes (Signed)
Acute Office Visit  Subjective:    Patient ID: Isaiah Wilson, male    DOB: [birth year], 85 y.o.   MRN: [id number]  Chief Complaint  Patient presents with  . Hospitalization Follow-up    Pt states blood pressure was over 200/110 and was having a bit of a headache but thinks allergies also cause that    HPI  Patient is in today for elevated BP over the past several days. He is accompanied by his wife. Reports BP yesterday was  >200/100 mmHg and he was advised by our office to go to the ED for evaluation. Reports BP remained elevated after he arrived and he was given Ativan IV and told to follow-up with PCP.  He reports he has felt "woozy" and unsteady on his feet with an "off feeling in his head" x 3 days. He reports BP has been gradually increasing over the past few days. He as monitoring BP at home and saw gradual increases over the day yesterday until he went to ED as advised. He denies recent medication changes except recently started generic Nyquil prior to bedtime. He denies increased life stressors, recent infection, or illness. He denies high sodium diet or heavy caffeine consumption. He denies hx of high BP.   Past Medical History:  Diagnosis Date  . Abnormal EKG    hx left anterior fasicular block on old ekg 2017  . Allergic rhinitis   . Allergy   . Barrett's esophagus   . Benign prostatic hypertrophy   . Colitis, ischemic ([place])    X 2  . Complication of anesthesia    SLOW TO AWAKEN AFTER 1 SURGERY 20  YRS AGO  . DJD (degenerative joint disease)    SPINE, AND OA  . GERD (gastroesophageal reflux disease)   . H/O hiatal hernia   . HOH (hard of hearing)    BOTH EARS  . HOH (hard of hearing)    BOTH EARS  . Hyperlipidemia    no medication per pt.  . Hypertension    pt denies says cardura for bladder  . Neuropathy     LEFT HAND AND ARM NUMB ALL THE TIME  . Overweight(278.02)     Past Surgical History:  Procedure Laterality Date  . 2 neck surgeries  2005-2006   Dr. Helane Gunther cx decompression, diskectomy,fusion C6-7 allograftWITH PLATE  . APPENDECTOMY    . EYE SURGERY     bilateral cataract surgerywith lens implant  . FOOT SURGERY     right x 3  . LAPAROSCOPIC CHOLECYSTECTOMY  1993   Dr. Charlynne Pander  . left total knee replacement  07/2007   Dr. Percell Miller   . right total knee replacement  2012   Dr. Maureen Ralphs  . SEPTOPLASTY  1975, May 2013   x3  . TONSILLECTOMY  as child  . TOTAL HIP ARTHROPLASTY Left 04/18/2018   Procedure: LEFT TOTAL HIP ARTHROPLASTY ANTERIOR APPROACH;  Surgeon: Gaynelle Arabian, MD;  Location: [facility];  Service: Orthopedics;  Laterality: Left;  . TOTAL HIP ARTHROPLASTY Right 03/13/2019   Procedure: TOTAL HIP ARTHROPLASTY ANTERIOR APPROACH;  Surgeon: Gaynelle Arabian, MD;  Location: [facility];  Service: Orthopedics;  Laterality: Right;  162min  . TOTAL KNEE REVISION Left 11/11/2015   Procedure: LEFT KNEE POLYETHELENE REVISION;  Surgeon: Gaynelle Arabian, MD;  Location: [facility];  Service: Orthopedics;  Laterality: Left;  . TOTAL KNEE REVISION Left 12/07/2016   Procedure: LEFT TOTAL KNEE REVISION;  Surgeon: Gaynelle Arabian, MD;  Location: [facility];  Service: Orthopedics;  Laterality: Left;  Adductor Block    Family History  Problem Relation Age of Onset  . Leukemia Mother   . Heart disease Father   . Heart disease Sister   . Colon cancer Neg Hx   . Stomach cancer Neg Hx   . Rectal cancer Neg Hx   . Esophageal cancer Neg Hx     Social History   Socioeconomic History  . Marital status: Married    Spouse name: Not on file  . Number of children: 2  . Years of education: some college  . Highest education level: Not on file  Occupational History  . Occupation: retired  Tobacco Use  . Smoking status: Never Smoker  . Smokeless tobacco: Never Used  Substance and Sexual Activity  . Alcohol use: No    Alcohol/week: 0.0 standard drinks  . Drug use: No  . Sexual activity: Not on file  Other Topics Concern  . Not on file  Social History  Narrative   Married   HH 2   2 children; 3 granchildren; 2 great grandchildren   Social Determinants of Health   Financial Resource Strain: Low Risk   . Difficulty of Paying Living Expenses: Not hard at all  Food Insecurity: No Food Insecurity  . Worried About Programme researcher, broadcasting/film/video in the Last Year: Never true  . Ran Out of Food in the Last Year: Never true  Transportation Needs: No Transportation Needs  . Lack of Transportation (Medical): No  . Lack of Transportation (Non-Medical): No  Physical Activity:   . Days of Exercise per Week:   . Minutes of Exercise per Session:   Stress: No Stress Concern Present  . Feeling of Stress : Only a little  Social Connections: Not Isolated  . Frequency of Communication with Friends and Family: More than three times a week  . Frequency of Social Gatherings with Friends and Family: Once a week  . Attends Religious Services: More than 4 times per year  . Active Member of Clubs or Organizations: Yes  . Attends Banker Meetings: More than 4 times per year  . Marital Status: Married  Catering manager Violence:   . Fear of Current or Ex-Partner:   . Emotionally Abused:   Marland Kitchen Physically Abused:   . Sexually Abused:     Outpatient Medications Prior to Visit  Medication Sig Dispense Refill  . aspirin EC 81 MG tablet Take 81 mg by mouth daily.    Marland Kitchen azelastine (ASTELIN) 0.1 % nasal spray Place 1 spray into both nostrils 2 (two) times daily as needed (allergies.).     Marland Kitchen doxazosin (CARDURA) 8 MG tablet TAKE ONE TABLET BY MOUTH EVERY NIGHT AT BEDTIME (Patient taking differently: Take 8 mg by mouth at bedtime. ) 90 tablet 0  . EPINEPHrine 0.3 mg/0.3 mL IJ SOAJ injection Inject 0.3 mg into the muscle as needed (allergic reaction).     . fluticasone (FLONASE) 50 MCG/ACT nasal spray Place 2 sprays into both nostrils daily.     . methocarbamol (ROBAXIN) 500 MG tablet Take 1 tablet (500 mg total) by mouth every 6 (six) hours as needed for muscle  spasms. 40 tablet 0  . montelukast (SINGULAIR) 10 MG tablet TAKE ONE TABLET BY MOUTH EVERY NIGHT AT BEDTIME (Patient taking differently: Take 10 mg by mouth at bedtime. ) 90 tablet 0  . Multiple Vitamins-Minerals (CENTRUM ADULTS) TABS Take 1 tablet by mouth daily.    Marland Kitchen NEXIUM 40 MG capsule  TAKE ONE CAPSULE BY MOUTH EVERY EVENING BEFORE SUPPER (Patient taking differently: Take 40 mg by mouth daily with supper. ) 90 capsule 3  . nystatin cream (MYCOSTATIN) Apply 1 application topically 2 (two) times daily. 30 g 1  . OVER THE COUNTER MEDICATION Take 30 mLs by mouth at bedtime. NightTime Cold and Flu from [employer]    . aspirin EC 325 MG EC tablet Take 1 tablet (325 mg total) by mouth 2 (two) times daily. (Patient not taking: Reported on 08/19/2019) 40 tablet 0   No facility-administered medications prior to visit.    No Known Allergies  Review of Systems GEN: Well nourished, well developed, in no acute distress, Alert and interactive HEENT: normocephalic, atraumatic. PERRL, EOMI, good conjugate gaze. Nares symmetric, patent. Moist mucous membranes Neck: Supple, no JVD or masses. Normal ROM Cardiac: RRR; no murmurs, rubs, or gallops, no edema  Respiratory:  clear to auscultation bilaterally, normal work of breathing GI: soft, nondistended MS: Normal tone and ROM, strength and sensation intact, no deformity or atrophy Skin: Intact, warm and dry, no rashes, no lesions, no erythema Neuro:  Alert and Oriented x 3, follows commands, gait normal Psych: euthymic mood, full affect     Objective:    Physical Exam Constitutional:      Appearance: Normal appearance.  HENT:     Head: Normocephalic and atraumatic.     Right Ear: Tympanic membrane normal.     Left Ear: Tympanic membrane normal.  Eyes:     Extraocular Movements: Extraocular movements intact.     Pupils: Pupils are equal, round, and reactive to light.  Cardiovascular:     Rate and Rhythm: Normal rate and regular rhythm.      Heart sounds: Normal heart sounds. No murmur. No friction rub. No gallop.   Pulmonary:     Effort: Pulmonary effort is normal.     Breath sounds: Normal breath sounds. No wheezing.  Abdominal:     Palpations: Abdomen is soft.  Musculoskeletal:     Cervical back: Normal range of motion and neck supple.  Skin:    General: Skin is warm and dry.     Capillary Refill: Capillary refill takes less than 2 seconds.     Findings: No erythema or rash.  Neurological:     General: No focal deficit present.     Mental Status: He is alert and oriented to person, place, and time.  Psychiatric:        Mood and Affect: Mood normal.        Behavior: Behavior normal.        Thought Content: Thought content normal.        Judgment: Judgment normal.     BP 138/82 (BP Location: Right Arm, Patient Position: Sitting, Cuff Size: Normal)   Pulse 66   Temp 97.9 F (36.6 C) (Temporal)   Wt 247 lb 4.8 oz (112.2 kg)   SpO2 96%   BMI 35.48 kg/m  Wt Readings from Last 3 Encounters:  08/19/19 247 lb 4.8 oz (112.2 kg)  06/11/19 225 lb 6.4 oz (102.2 kg)  05/17/19 234 lb 12.8 oz (106.5 kg)    Health Maintenance Due  Topic Date Due  . TETANUS/TDAP  06/23/2019    There are no preventive care reminders to display for this patient.   Lab Results  Component Value Date   TSH 2.40 11/22/2017   Lab Results  Component Value Date   WBC 8.2 08/18/2019   HGB 13.2 08/18/2019  HCT 40.8 08/18/2019   MCV 87.7 08/18/2019   PLT 188 08/18/2019   Lab Results  Component Value Date   NA 140 08/18/2019   K 4.0 08/18/2019   CO2 26 08/18/2019   GLUCOSE 102 (H) 08/18/2019   BUN 12 08/18/2019   CREATININE 0.98 08/18/2019   BILITOT 0.7 08/18/2019   ALKPHOS 59 08/18/2019   AST 23 08/18/2019   ALT 18 08/18/2019   PROT 7.4 08/18/2019   ALBUMIN 4.2 08/18/2019   CALCIUM 9.0 08/18/2019   ANIONGAP 9 08/18/2019   GFR 79.98 11/22/2017   Lab Results  Component Value Date   CHOL 169 11/22/2017   Lab Results   Component Value Date   HDL 40.10 11/22/2017   Lab Results  Component Value Date   LDLCALC 96 11/22/2017   Lab Results  Component Value Date   TRIG 164.0 (H) 11/22/2017   Lab Results  Component Value Date   CHOLHDL 4 11/22/2017   Lab Results  Component Value Date   HGBA1C 5.7 (A) 02/07/2018       Assessment & Plan:   Problem List Items Addressed This Visit    None       Meds ordered this encounter  Medications  . losartan (COZAAR) 50 MG tablet    Sig: Take 1 tablet (50 mg total) by mouth daily.    Dispense:  30 tablet    Refill:  11   ASSESSMENT: 1. Elevated BP: His home BP cuff was compared to our BP reading with manual cuff and produced a similar reading. Repeat BP taken by Dr. Caryl Never is 150/68 mmHg right arm   PLAN: 1. Elevated BP: start losartan 50 mg once daily. Advised him to stop taking generic Nyquil at bedtime. Monitor BP daily and record. 2. Follow up in 1 month with basic metabolic panel. Call sooner if BP remains elevated  Levi Aland, RN, BSN AGPCNP Student, [employer] [facility]  Pt examined with student.  Agree with notes above.  No hx of hypertension but severe elevation of BP past couple of days.  ER notes and labs reviewed.  He has been on Cardura for years for BPH symptoms.   No hx of reported recent orthostasis.   Kristian Covey MD [facility]

## 2019-09-02 ENCOUNTER — Telehealth: Payer: Self-pay | Admitting: Family Medicine

## 2019-09-02 MED ORDER — LOSARTAN POTASSIUM 100 MG PO TABS: 100.0000 mg | ORAL_TABLET | Freq: Every day | ORAL | 1 refills | Status: DC

## 2019-09-02 NOTE — Telephone Encounter (Signed)
Lvm for pt to call back. 

## 2019-09-02 NOTE — Telephone Encounter (Signed)
Increase the losartan to 100 mg daily and set up office follow-up in 1 week to reassess

## 2019-09-02 NOTE — Telephone Encounter (Signed)
Please advise 

## 2019-09-02 NOTE — Telephone Encounter (Signed)
The patient started new BP medication about 2 weeks ago.  175/89 yesterday 182/91  The patients wife has more BP records but the ranges keep changing and wanting to ask Dr. Caryl Never if the patients medication needs to be adjusted or is this okay.  Please advise

## 2019-09-02 NOTE — Telephone Encounter (Signed)
Pt has been notified and 100mg  sent in

## 2019-09-04 ENCOUNTER — Telehealth: Payer: Self-pay | Admitting: Family Medicine

## 2019-09-04 DIAGNOSIS — R42 Dizziness and giddiness: Secondary | ICD-10-CM

## 2019-09-04 DIAGNOSIS — I10 Essential (primary) hypertension: Secondary | ICD-10-CM

## 2019-09-04 NOTE — Telephone Encounter (Signed)
Pt has been told that referral has been placed
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
## 2019-09-04 NOTE — Telephone Encounter (Signed)
OK to refer.

## 2019-09-04 NOTE — Telephone Encounter (Signed)
Please advise 

## 2019-09-10 DIAGNOSIS — J301 Allergic rhinitis due to pollen: Secondary | ICD-10-CM | POA: Diagnosis not present

## 2019-09-12 ENCOUNTER — Encounter: Payer: Self-pay | Admitting: Cardiology

## 2019-09-12 NOTE — Progress Notes (Signed)
Referring-Bruce Burchett, MD Reason for referral-hypertension  HPI: 85 year old male for evaluation of hypertension at request of Edwin Cap, MD.  Brain MRI March 2021 with minor chronic microvascular ischemic changes.  Patient recently complained of some dizziness.  He was seen in the emergency room and his blood pressure was 200/90.  Losartan added to medical regimen.  Hemoglobin 13.2, BUN 12, creatinine 0.98, sodium 140, potassium 4.0.  Patient states he has had dizziness for the past 2 years.  Can occur while sitting but occurs always while standing or walking.  He has not had syncope.  He has occasional chest aching when he mows his yard relieved with rest.  He has had this for years by his report.  He has dyspnea with more vigorous activities but not routine activities.  No orthopnea, PND or pedal edema.  Because of the above cardiology was asked to evaluate.  Current Outpatient Medications  Medication Sig Dispense Refill   aspirin EC 81 MG tablet Take 81 mg by mouth daily.     azelastine (ASTELIN) 0.1 % nasal spray Place 1 spray into both nostrils 2 (two) times daily as needed (allergies.).      doxazosin (CARDURA) 8 MG tablet TAKE ONE TABLET BY MOUTH EVERY NIGHT AT BEDTIME (Patient taking differently: Take 8 mg by mouth at bedtime. ) 90 tablet 0   EPINEPHrine 0.3 mg/0.3 mL IJ SOAJ injection Inject 0.3 mg into the muscle as needed (allergic reaction).      fluticasone (FLONASE) 50 MCG/ACT nasal spray Place 2 sprays into both nostrils daily.      losartan (COZAAR) 100 MG tablet Take 1 tablet (100 mg total) by mouth daily. 30 tablet 1   montelukast (SINGULAIR) 10 MG tablet TAKE ONE TABLET BY MOUTH EVERY NIGHT AT BEDTIME (Patient taking differently: Take 10 mg by mouth at bedtime. ) 90 tablet 0   NEXIUM 40 MG capsule TAKE ONE CAPSULE BY MOUTH EVERY EVENING BEFORE SUPPER (Patient taking differently: Take 40 mg by mouth daily with supper. ) 90 capsule 3   No current  facility-administered medications for this visit.    No Known Allergies   Past Medical History:  Diagnosis Date   Abnormal EKG    hx left anterior fasicular block on old ekg 2017   Allergic rhinitis    Allergy    Barrett's esophagus    Benign prostatic hypertrophy    Colitis, ischemic ([facility])    X 2   Complication of anesthesia    SLOW TO AWAKEN AFTER 1 SURGERY 20  YRS AGO   DJD (degenerative joint disease)    SPINE, AND OA   GERD (gastroesophageal reflux disease)    H/O hiatal hernia    HOH (hard of hearing)    BOTH EARS   Hyperlipidemia    no medication per pt.   Hypertension    pt denies says cardura for bladder   Neuropathy     LEFT HAND AND ARM NUMB ALL THE TIME   Overweight(278.02)     Past Surgical History:  Procedure Laterality Date   2 neck surgeries  2005-2006   Dr. Georgia Dom cx decompression, diskectomy,fusion C6-7 allograftWITH PLATE   APPENDECTOMY     EYE SURGERY     bilateral cataract surgerywith lens implant   FOOT SURGERY     right x 3   LAPAROSCOPIC CHOLECYSTECTOMY  1993   Dr. Luan Moore   left total knee replacement  07/2007   Dr. Eulah Pont    right total  knee replacement  2012   Dr. Maureen Ralphs   SEPTOPLASTY  1975, May 2013   x3   TONSILLECTOMY  as child   TOTAL HIP ARTHROPLASTY Left 04/18/2018   Procedure: LEFT TOTAL HIP ARTHROPLASTY ANTERIOR APPROACH;  Surgeon: Gaynelle Arabian, MD;  Location: [facility];  Service: Orthopedics;  Laterality: Left;   TOTAL HIP ARTHROPLASTY Right 03/13/2019   Procedure: TOTAL HIP ARTHROPLASTY ANTERIOR APPROACH;  Surgeon: Gaynelle Arabian, MD;  Location: [facility];  Service: Orthopedics;  Laterality: Right;  133min   TOTAL KNEE REVISION Left 11/11/2015   Procedure: LEFT KNEE POLYETHELENE REVISION;  Surgeon: Gaynelle Arabian, MD;  Location: [facility];  Service: Orthopedics;  Laterality: Left;   TOTAL KNEE REVISION Left 12/07/2016   Procedure: LEFT TOTAL KNEE REVISION;  Surgeon: Gaynelle Arabian, MD;  Location: [facility];  Service: Orthopedics;  Laterality: Left;  Adductor Block    Social History   Socioeconomic History   Marital status: Married    Spouse name: Not on file   Number of children: 2   Years of education: some college   Highest education level: Not on file  Occupational History   Occupation: retired  Tobacco Use   Smoking status: Never Smoker   Smokeless tobacco: Never Used  Substance and Sexual Activity   Alcohol use: No    Alcohol/week: 0.0 standard drinks   Drug use: No   Sexual activity: Not on file  Other Topics Concern   Not on file  Social History Narrative   Married   [place] 2   2 children; 3 granchildren; 2 great grandchildren   Social Determinants of Health   Financial Resource Strain: Low Risk    Difficulty of Paying Living Expenses: Not hard at all  Food Insecurity: No Food Insecurity   Worried About Charity fundraiser in the Last Year: Never true   Arboriculturist in the Last Year: Never true  Transportation Needs: No Transportation Needs   Lack of Transportation (Medical): No   Lack of Transportation (Non-Medical): No  Physical Activity:    Days of Exercise per Week:    Minutes of Exercise per Session:   Stress: No Stress Concern Present   Feeling of Stress : Only a little  Social Connections: Not Isolated   Frequency of Communication with Friends and Family: More than three times a week   Frequency of Social Gatherings with Friends and Family: Once a week   Attends Religious Services: More than 4 times per year   Active Member of [employer] or Organizations: Yes   Attends Music therapist: More than 4 times per year   Marital Status: Married  Human resources officer Violence:    Fear of Current or Ex-Partner:    Emotionally Abused:    Physically Abused:    Sexually Abused:     Family History  Problem Relation Age of Onset   Leukemia Mother    Heart disease Father    Heart disease Sister    Colon cancer Neg Hx     Stomach cancer Neg Hx    Rectal cancer Neg Hx    Esophageal cancer Neg Hx     ROS: Arthralgias and back discomfort but no fevers or chills, productive cough, hemoptysis, dysphasia, odynophagia, melena, hematochezia, dysuria, hematuria, rash, seizure activity, orthopnea, PND, pedal edema, claudication. Remaining systems are negative.  Physical Exam:   Blood pressure (!) 146/80, pulse (!) 53, height 5\' 10"  (1.778 m), weight 248 lb (112.5 kg).  General:  Well  developed/well nourished in NAD Skin warm/dry Patient not depressed No peripheral clubbing Back-normal HEENT-normal/normal eyelids Neck supple/normal carotid upstroke bilaterally; no bruits; no JVD; no thyromegaly chest - CTA/ normal expansion CV - RRR/normal S1 and S2; no murmurs, rubs or gallops;  PMI nondisplaced Abdomen -NT/ND, no HSM, no mass, + bowel sounds, no bruit 2+ femoral pulses, no bruits Ext-no edema, chords, 2+ DP Neuro-grossly nonfocal  ECG -August 18, 2019-sinus rhythm, PACs, left anterior fascicular block, RV conduction delay, left ventricular hypertrophy, cannot rule out anterior infarct.  Personally reviewed  Today's electrocardiogram personally reviewed, sinus bradycardia, cannot rule out anterior infarct, left axis deviation, left ventricular hypertrophy.  A/P  1 hypertension-blood pressure mildly elevated.  Add amlodipine 5 mg daily and follow.  2 chest pain-patient with symptoms concerning for angina but states that he has had these for years.  I will arrange a Lexiscan nuclear study for risk stratification.  3 abnormal electrocardiogram-cannot rule out prior anterior infarct.  We will arrange echocardiogram to assess LV function and wall motion.  4 dizziness-etiology unclear.  However it is continuous with standing and walking.  No other neurological symptoms.  Does not appear to be cardiac related.  Follow-up primary care.  5 hyperlipidemia-Per primary care.  Olga Millers, MD

## 2019-09-16 ENCOUNTER — Encounter: Payer: Self-pay | Admitting: Family Medicine

## 2019-09-16 ENCOUNTER — Ambulatory Visit (INDEPENDENT_AMBULATORY_CARE_PROVIDER_SITE_OTHER): Payer: Medicare PPO | Admitting: Family Medicine

## 2019-09-16 ENCOUNTER — Other Ambulatory Visit: Payer: Self-pay

## 2019-09-16 VITALS — BP 124/62 | HR 72 | Temp 98.5°F | Wt 250.6 lb

## 2019-09-16 DIAGNOSIS — I10 Essential (primary) hypertension: Secondary | ICD-10-CM | POA: Diagnosis not present

## 2019-09-16 DIAGNOSIS — R5383 Other fatigue: Secondary | ICD-10-CM

## 2019-09-16 DIAGNOSIS — R400 Somnolence: Secondary | ICD-10-CM | POA: Diagnosis not present

## 2019-09-16 LAB — BASIC METABOLIC PANEL
BUN: 13 mg/dL (ref 6–23)
CO2: 27 mEq/L (ref 19–32)
Calcium: 9.1 mg/dL (ref 8.4–10.5)
Chloride: 105 mEq/L (ref 96–112)
Creatinine, Ser: 0.9 mg/dL (ref 0.40–1.50)
GFR: 80.7 mL/min (ref >60.00)
Glucose, Bld: 99 mg/dL (ref 70–99)
Potassium: 4.3 mEq/L (ref 3.5–5.1)
Sodium: 140 mEq/L (ref 135–145)

## 2019-09-16 LAB — TSH: TSH: 3.13 u[IU]/mL (ref 0.35–4.50)

## 2019-09-16 NOTE — Progress Notes (Signed)
Subjective:     Patient ID: Isaiah Wilson, male   DOB: 28-Aug-1934, 85 y.o.   MRN: [id number]  HPI Isaiah Wilson is seen for follow-up regarding some vague dizziness and elevated blood pressures.  Refer to previous note from back in March.  He had recent ER visit for the severe elevated blood pressure reading.  We ended up starting losartan 50 mg daily and subsequently titrated this to 100 mg based on several elevated home readings.  We reviewed several weeks of home readings and he has some that are in normal range but still has several that are significantly elevated- some as high as 170 systolic.  He still has some vague dizziness.  This sounds like more of an "off-balanc"e sensation. No vertigo.  No syncope.  No consistent orthostatic symptoms.  No focal weakness.  No ataxia.  He takes Cardura which was started on this years ago by another physician for BPH issues.  He takes that at night.  He complains of increased fatigue issues.  His wife states that he snores and she has observed that he gasps sometimes for breath and also increased daytime somnolence.  He is not aware of prior work up for OSA.    Past Medical History:  Diagnosis Date  . Abnormal EKG    hx left anterior fasicular block on old ekg 2017  . Allergic rhinitis   . Allergy   . Barrett's esophagus   . Benign prostatic hypertrophy   . Colitis, ischemic (HCC)    X 2  . Complication of anesthesia    SLOW TO AWAKEN AFTER 1 SURGERY 20  YRS AGO  . DJD (degenerative joint disease)    SPINE, AND OA  . GERD (gastroesophageal reflux disease)   . H/O hiatal hernia   . HOH (hard of hearing)    BOTH EARS  . Hyperlipidemia    no medication per pt.  . Hypertension    pt denies says cardura for bladder  . Neuropathy     LEFT HAND AND ARM NUMB ALL THE TIME  . Overweight(278.02)    Past Surgical History:  Procedure Laterality Date  . 2 neck surgeries  2005-2006   Dr. Georgia Dom cx decompression, diskectomy,fusion C6-7  allograftWITH PLATE  . APPENDECTOMY    . EYE SURGERY     bilateral cataract surgerywith lens implant  . FOOT SURGERY     right x 3  . LAPAROSCOPIC CHOLECYSTECTOMY  1993   Dr. Luan Moore  . left total knee replacement  07/2007   Dr. Eulah Pont   . right total knee replacement  2012   Dr. Despina Hick  . SEPTOPLASTY  1975, May 2013   x3  . TONSILLECTOMY  as child  . TOTAL HIP ARTHROPLASTY Left 04/18/2018   Procedure: LEFT TOTAL HIP ARTHROPLASTY ANTERIOR APPROACH;  Surgeon: Ollen Gross, MD;  Location: [facility];  Service: Orthopedics;  Laterality: Left;  . TOTAL HIP ARTHROPLASTY Right 03/13/2019   Procedure: TOTAL HIP ARTHROPLASTY ANTERIOR APPROACH;  Surgeon: Ollen Gross, MD;  Location: [facility];  Service: Orthopedics;  Laterality: Right;   . TOTAL KNEE REVISION Left 11/11/2015   Procedure: LEFT KNEE POLYETHELENE REVISION;  Surgeon: Ollen Gross, MD;  Location: [facility];  Service: Orthopedics;  Laterality: Left;  . TOTAL KNEE REVISION Left 12/07/2016   Procedure: LEFT TOTAL KNEE REVISION;  Surgeon: Ollen Gross, MD;  Location: [facility];  Service: Orthopedics;  Laterality: Left;  Adductor Block    reports that he has never smoked. He  has never used smokeless tobacco. He reports that he does not drink alcohol or use drugs. family history includes Heart disease in his father and sister; Leukemia in his mother. No Known Allergies   Review of Systems  Constitutional: Positive for fatigue. Negative for chills and fever.  Respiratory: Negative for cough.   Cardiovascular: Negative for chest pain.  Gastrointestinal: Negative for abdominal pain.  Genitourinary: Negative for dysuria.  Neurological: Positive for dizziness.       Objective:   Physical Exam Constitutional:      Appearance: He is well-developed.  HENT:     Right Ear: External ear normal.     Left Ear: External ear normal.  Eyes:     Pupils: Pupils are equal, round, and reactive to light.  Neck:     Thyroid: No thyromegaly.   Cardiovascular:     Rate and Rhythm: Normal rate and regular rhythm.  Pulmonary:     Effort: Pulmonary effort is normal. No respiratory distress.     Breath sounds: Normal breath sounds. No wheezing or rales.  Musculoskeletal:     Cervical back: Neck supple.  Neurological:     Mental Status: He is alert and oriented to person, place, and time.     Cranial Nerves: No cranial nerve deficit.     Motor: No weakness.     Coordination: Coordination normal.     Gait: Gait normal.        Assessment:     #1 hypertension improved.  Blood pressure large cuff left arm seated 124/62 and standing 128/62  #2 increased fatigue.  Concern by history for possible obstructive sleep apnea    Plan:     -Check TSH and basic metabolic panel -Continue losartan 100 mg daily -Will set up referral for rule out obstructive sleep apnea -He has cardiology appointment pending-which family had requested regarding second opinion for his blood pressure management.  [name] MD [facility]

## 2019-09-17 ENCOUNTER — Telehealth: Payer: Self-pay | Admitting: Cardiology

## 2019-09-17 NOTE — Telephone Encounter (Signed)
New Message:      Pt says he wife need to come in with him tomorrow for his appt with Dr Jens Som. He says he is very unsteady on his feet

## 2019-09-17 NOTE — Telephone Encounter (Signed)
Spoke with pt, okay for wife to come to appointment.

## 2019-09-18 ENCOUNTER — Ambulatory Visit: Payer: Medicare PPO | Admitting: Cardiology

## 2019-09-18 ENCOUNTER — Encounter: Payer: Self-pay | Admitting: Cardiology

## 2019-09-18 ENCOUNTER — Encounter: Payer: Self-pay | Admitting: *Deleted

## 2019-09-18 ENCOUNTER — Other Ambulatory Visit: Payer: Self-pay

## 2019-09-18 VITALS — BP 146/80 | HR 53 | Ht 70.0 in | Wt 248.0 lb

## 2019-09-18 DIAGNOSIS — R9431 Abnormal electrocardiogram [ECG] [EKG]: Secondary | ICD-10-CM

## 2019-09-18 DIAGNOSIS — I10 Essential (primary) hypertension: Secondary | ICD-10-CM | POA: Diagnosis not present

## 2019-09-18 DIAGNOSIS — R072 Precordial pain: Secondary | ICD-10-CM | POA: Diagnosis not present

## 2019-09-18 MED ORDER — AMLODIPINE BESYLATE 5 MG PO TABS: 5.0000 mg | ORAL_TABLET | Freq: Every day | ORAL | 3 refills | Status: DC

## 2019-09-18 NOTE — Patient Instructions (Signed)
Medication Instructions:  START AMLODIPINE 5 MG ONCE DAILY  *If you need a refill on your cardiac medications before your next appointment, please call your pharmacy*   Lab Work: If you have labs (blood work) drawn today and your tests are completely normal, you will receive your results only by: Marland Kitchen MyChart Message (if you have MyChart) OR . A paper copy in the mail If you have any lab test that is abnormal or we need to change your treatment, we will call you to review the results.   Testing/Procedures: Your physician has requested that you have an echocardiogram. Echocardiography is a painless test that uses sound waves to create images of your heart. It provides your doctor with information about the size and shape of your heart and how well your heart's chambers and valves are working. This procedure takes approximately one hour. There are no restrictions for this procedure.[address]  Your physician has requested that you have a lexiscan myoview. For further information please visit [email or web address] Please follow instruction sheet, as given.[address]   Follow-Up: At [facility], you and your health needs are our priority.  As part of our continuing mission to provide you with exceptional heart care, we have created designated Provider Care Teams.  These Care Teams include your primary Cardiologist (physician) and Advanced Practice Providers (APPs -  Physician Assistants and Nurse Practitioners) who all work together to provide you with the care you need, when you need it.  We recommend signing up for the patient portal called "MyChart".  Sign up information is provided on this After Visit Summary.  MyChart is used to connect with patients for Virtual Visits (Telemedicine).  Patients are able to view lab/test results, encounter notes, upcoming appointments, etc.  Non-urgent messages can be sent to your provider as well.   To learn more about what you can  do with MyChart, go to [email or web address].    Your next appointment:   3 month(s)  The format for your next appointment:   In Person  Provider:   Olga Millers, MD

## 2019-10-02 ENCOUNTER — Telehealth (HOSPITAL_COMMUNITY): Payer: Self-pay | Admitting: *Deleted

## 2019-10-02 NOTE — Telephone Encounter (Signed)
Patient given detailed instructions per Myocardial Perfusion Study Information Sheet for the test on 10/07/19 . Patient notified to arrive 15 minutes early and that it is imperative to arrive on time for appointment to keep from having the test rescheduled.  If you need to cancel or reschedule your appointment, please call the office within 24 hours of your appointment. . Patient verbalized understanding. Ricky Ala

## 2019-10-03 DIAGNOSIS — J301 Allergic rhinitis due to pollen: Secondary | ICD-10-CM | POA: Diagnosis not present

## 2019-10-04 ENCOUNTER — Other Ambulatory Visit (HOSPITAL_COMMUNITY): Payer: Medicare PPO

## 2019-10-07 ENCOUNTER — Ambulatory Visit (HOSPITAL_COMMUNITY): Payer: Medicare PPO | Attending: Cardiology

## 2019-10-07 ENCOUNTER — Ambulatory Visit (HOSPITAL_BASED_OUTPATIENT_CLINIC_OR_DEPARTMENT_OTHER): Payer: Medicare PPO

## 2019-10-07 ENCOUNTER — Other Ambulatory Visit: Payer: Self-pay

## 2019-10-07 VITALS — Ht 70.0 in | Wt 248.0 lb

## 2019-10-07 DIAGNOSIS — R9431 Abnormal electrocardiogram [ECG] [EKG]: Secondary | ICD-10-CM

## 2019-10-07 DIAGNOSIS — R072 Precordial pain: Secondary | ICD-10-CM | POA: Insufficient documentation

## 2019-10-07 DIAGNOSIS — R110 Nausea: Secondary | ICD-10-CM

## 2019-10-07 LAB — MYOCARDIAL PERFUSION IMAGING
LV dias vol: 103 mL (ref 62–150)
LV sys vol: 40 mL
Peak HR: 70 {beats}/min
Rest HR: 50 {beats}/min
SDS: 1
SRS: 0
SSS: 1
TID: 0.98

## 2019-10-07 MED ORDER — REGADENOSON 0.4 MG/5ML IV SOLN
0.4000 mg | Freq: Once | INTRAVENOUS | Status: AC
  Administered 2019-10-07: 0.4 mg via INTRAVENOUS

## 2019-10-07 MED ORDER — TECHNETIUM TC 99M TETROFOSMIN IV KIT
10.6000 | PACK | Freq: Once | INTRAVENOUS | Status: AC | PRN
  Administered 2019-10-07: 10.6 via INTRAVENOUS
  Filled 2019-10-07: qty 11

## 2019-10-07 MED ORDER — TECHNETIUM TC 99M TETROFOSMIN IV KIT
32.6000 | PACK | Freq: Once | INTRAVENOUS | Status: AC | PRN
  Administered 2019-10-07: 32.6 via INTRAVENOUS
  Filled 2019-10-07: qty 33

## 2019-10-07 MED ORDER — AMINOPHYLLINE 25 MG/ML IV SOLN
75.0000 mg | Freq: Once | INTRAVENOUS | Status: AC
  Administered 2019-10-07: 75 mg via INTRAVENOUS

## 2019-10-08 ENCOUNTER — Telehealth: Payer: Self-pay | Admitting: *Deleted

## 2019-10-08 DIAGNOSIS — I712 Thoracic aortic aneurysm, without rupture, unspecified: Secondary | ICD-10-CM

## 2019-10-08 NOTE — Telephone Encounter (Signed)
pt aware of results, order placed for CTA

## 2019-10-08 NOTE — Telephone Encounter (Signed)
-----   Message from [name], MD sent at 10/07/2019 11:16 AM EDT ----- Schedule CTA of thoracic aorta for aortic dilatation. Isaiah Wilson

## 2019-10-09 ENCOUNTER — Telehealth: Payer: Self-pay | Admitting: Cardiology

## 2019-10-09 DIAGNOSIS — J301 Allergic rhinitis due to pollen: Secondary | ICD-10-CM | POA: Diagnosis not present

## 2019-10-09 NOTE — Telephone Encounter (Signed)
Left message for patient to call and schedule CTA chest aorta ordered by Dr. Jens Som

## 2019-10-18 ENCOUNTER — Ambulatory Visit (INDEPENDENT_AMBULATORY_CARE_PROVIDER_SITE_OTHER)
Admission: RE | Admit: 2019-10-18 | Discharge: 2019-10-18 | Disposition: A | Discharge status: Home / Self Care | Payer: Medicare PPO | Source: Ambulatory Visit | Attending: Cardiology | Admitting: Cardiology

## 2019-10-18 ENCOUNTER — Other Ambulatory Visit: Payer: Self-pay

## 2019-10-18 DIAGNOSIS — I712 Thoracic aortic aneurysm, without rupture, unspecified: Secondary | ICD-10-CM

## 2019-10-18 MED ORDER — IOHEXOL 350 MG/ML SOLN
100.0000 mL | Freq: Once | INTRAVENOUS | Status: AC | PRN
  Administered 2019-10-18: 100 mL via INTRAVENOUS

## 2019-10-21 DIAGNOSIS — J301 Allergic rhinitis due to pollen: Secondary | ICD-10-CM | POA: Diagnosis not present

## 2019-11-01 DIAGNOSIS — J301 Allergic rhinitis due to pollen: Secondary | ICD-10-CM | POA: Diagnosis not present

## 2019-11-01 DIAGNOSIS — J3089 Other allergic rhinitis: Secondary | ICD-10-CM | POA: Diagnosis not present

## 2019-11-11 ENCOUNTER — Other Ambulatory Visit: Payer: Self-pay | Admitting: Family Medicine

## 2019-11-21 DIAGNOSIS — J3089 Other allergic rhinitis: Secondary | ICD-10-CM | POA: Diagnosis not present

## 2019-11-21 DIAGNOSIS — J301 Allergic rhinitis due to pollen: Secondary | ICD-10-CM | POA: Diagnosis not present

## 2019-11-26 DIAGNOSIS — H6641 Suppurative otitis media, unspecified, right ear: Secondary | ICD-10-CM | POA: Diagnosis not present

## 2019-11-26 DIAGNOSIS — H903 Sensorineural hearing loss, bilateral: Secondary | ICD-10-CM | POA: Diagnosis not present

## 2019-11-26 DIAGNOSIS — H9313 Tinnitus, bilateral: Secondary | ICD-10-CM | POA: Diagnosis not present

## 2019-11-26 DIAGNOSIS — Z9622 Myringotomy tube(s) status: Secondary | ICD-10-CM | POA: Diagnosis not present

## 2019-11-26 DIAGNOSIS — H9211 Otorrhea, right ear: Secondary | ICD-10-CM | POA: Diagnosis not present

## 2019-11-28 DIAGNOSIS — J301 Allergic rhinitis due to pollen: Secondary | ICD-10-CM | POA: Diagnosis not present

## 2019-12-04 ENCOUNTER — Ambulatory Visit: Payer: Medicare PPO | Admitting: Podiatry

## 2019-12-04 ENCOUNTER — Other Ambulatory Visit: Payer: Self-pay

## 2019-12-04 ENCOUNTER — Encounter: Payer: Self-pay | Admitting: Podiatry

## 2019-12-04 VITALS — Temp 97.7°F

## 2019-12-04 DIAGNOSIS — L84 Corns and callosities: Secondary | ICD-10-CM

## 2019-12-04 DIAGNOSIS — M79675 Pain in left toe(s): Secondary | ICD-10-CM

## 2019-12-04 DIAGNOSIS — B351 Tinea unguium: Secondary | ICD-10-CM | POA: Diagnosis not present

## 2019-12-04 DIAGNOSIS — D689 Coagulation defect, unspecified: Secondary | ICD-10-CM

## 2019-12-04 DIAGNOSIS — M79674 Pain in right toe(s): Secondary | ICD-10-CM | POA: Diagnosis not present

## 2019-12-04 DIAGNOSIS — G629 Polyneuropathy, unspecified: Secondary | ICD-10-CM | POA: Diagnosis not present

## 2019-12-04 DIAGNOSIS — H903 Sensorineural hearing loss, bilateral: Secondary | ICD-10-CM | POA: Insufficient documentation

## 2019-12-04 MED ORDER — GABAPENTIN 300 MG PO CAPS: 300.0000 mg | ORAL_CAPSULE | Freq: Three times a day (TID) | ORAL | 3 refills | Status: DC

## 2019-12-05 NOTE — Progress Notes (Signed)
Subjective:   Patient ID: Isaiah Wilson, male   DOB: 85 y.o.   MRN: [id number]   HPI Patient presents with chronic nail disease 1-5 both feet and lesions on the fifth and first metatarsal of both feet with history of clotting disorder.  Also complaining about increased burning pain especially at night and during the day after activity   ROS      Objective:  Physical Exam  Neurovascular status found to be unchanged from previous visit with patient noted to have thick yellow brittle nailbeds 1-5 both feet that are incurvated in the corners and tender and lesion subone subfive bilateral with moderate diminishment of sharp dull vibratory bilateral     Assessment:  Probability for neuropathy-like condition with thick yellow brittle nailbeds 1-5 both feet and lesion formation subone subfive bilateral     Plan:  H&P conditions reviewed and today I debrided nailbeds 1-5 both feet I debrided lesions bilateral no iatrogenic bleeding and I then went ahead and I discussed neuropathy and educated him on this and we are going to start him on gabapentin with 1 to be taken at night followed by morning and midday if tolerated.  I educated him on medication and other consider treatments we could do for the condition the patient has

## 2019-12-05 NOTE — Progress Notes (Signed)
HPI: Follow-up hypertension. Brain MRI March 2021 with minor chronic microvascular ischemic changes.  Echocardiogram May 2021 showed normal LV function, severe left ventricular hypertrophy, grade 2 diastolic dysfunction, mild left atrial enlargement; mild aortic insufficiency and dilated ascending aorta at 44 mm.  Nuclear study May 2021 showed ejection fraction 62% and no ischemia.  CTA May 2021 showed 4.2 cm ascending aortic aneurysm and atherosclerosis including aortic and coronary disease. Since last seen patient denies dyspnea, chest pain, palpitations or syncope.  Current Outpatient Medications  Medication Sig Dispense Refill  . amLODipine (NORVASC) 5 MG tablet Take 1 tablet (5 mg total) by mouth daily. 90 tablet 3  . aspirin EC 81 MG tablet Take 81 mg by mouth daily.    Marland Kitchen azelastine (ASTELIN) 0.1 % nasal spray Place 1 spray into both nostrils 2 (two) times daily as needed (allergies.).     Marland Kitchen ciprofloxacin-dexamethasone (CIPRODEX) OTIC suspension     . doxazosin (CARDURA) 8 MG tablet TAKE ONE TABLET BY MOUTH EVERY NIGHT AT BEDTIME (Patient taking differently: Take 8 mg by mouth at bedtime. ) 90 tablet 0  . EPINEPHrine 0.3 mg/0.3 mL IJ SOAJ injection Inject 0.3 mg into the muscle as needed (allergic reaction).     . fluticasone (FLONASE) 50 MCG/ACT nasal spray Place 2 sprays into both nostrils daily.     Marland Kitchen gabapentin (NEURONTIN) 300 MG capsule Take 1 capsule (300 mg total) by mouth 3 (three) times daily. 90 capsule 3  . losartan (COZAAR) 100 MG tablet TAKE ONE TABLET BY MOUTH DAILY. *DOSE INCREASE* 90 tablet 0  . montelukast (SINGULAIR) 10 MG tablet TAKE ONE TABLET BY MOUTH EVERY NIGHT AT BEDTIME (Patient taking differently: Take 10 mg by mouth at bedtime. ) 90 tablet 0  . NEXIUM 40 MG capsule TAKE ONE CAPSULE BY MOUTH EVERY EVENING BEFORE SUPPER (Patient taking differently: Take 40 mg by mouth daily with supper. ) 90 capsule 3  . sulfamethoxazole-trimethoprim (BACTRIM DS) 800-160 MG  tablet      No current facility-administered medications for this visit.     Past Medical History:  Diagnosis Date  . Abnormal EKG    hx left anterior fasicular block on old ekg 2017  . Allergic rhinitis   . Allergy   . Barrett's esophagus   . Benign prostatic hypertrophy   . Colitis, ischemic (HCC)    X 2  . Complication of anesthesia    SLOW TO AWAKEN AFTER 1 SURGERY 20  YRS AGO  . DJD (degenerative joint disease)    SPINE, AND OA  . GERD (gastroesophageal reflux disease)   . H/O hiatal hernia   . HOH (hard of hearing)    BOTH EARS  . Hyperlipidemia    no medication per pt.  . Hypertension    pt denies says cardura for bladder  . Neuropathy     LEFT HAND AND ARM NUMB ALL THE TIME  . Overweight(278.02)     Past Surgical History:  Procedure Laterality Date  . 2 neck surgeries  2005-2006   Dr. Georgia Dom cx decompression, diskectomy,fusion C6-7 allograftWITH PLATE  . APPENDECTOMY    . EYE SURGERY     bilateral cataract surgerywith lens implant  . FOOT SURGERY     right x 3  . LAPAROSCOPIC CHOLECYSTECTOMY  1993   Dr. Luan Moore  . left total knee replacement  07/2007   Dr. Eulah Pont   . right total knee replacement  2012   Dr. Despina Hick  . SEPTOPLASTY  6712, May 2013   x3  . TONSILLECTOMY  as child  . TOTAL HIP ARTHROPLASTY Left 04/18/2018   Procedure: LEFT TOTAL HIP ARTHROPLASTY ANTERIOR APPROACH;  Surgeon: Ollen Gross, MD;  Location: [facility];  Service: Orthopedics;  Laterality: Left;  . TOTAL HIP ARTHROPLASTY Right 03/13/2019   Procedure: TOTAL HIP ARTHROPLASTY ANTERIOR APPROACH;  Surgeon: Ollen Gross, MD;  Location: [facility];  Service: Orthopedics;  Laterality: Right;   . TOTAL KNEE REVISION Left 11/11/2015   Procedure: LEFT KNEE POLYETHELENE REVISION;  Surgeon: Ollen Gross, MD;  Location: [facility];  Service: Orthopedics;  Laterality: Left;  . TOTAL KNEE REVISION Left 12/07/2016   Procedure: LEFT TOTAL KNEE REVISION;  Surgeon: Ollen Gross, MD;  Location:  [facility];  Service: Orthopedics;  Laterality: Left;  Adductor Block    Social History   Socioeconomic History  . Marital status: Married    Spouse name: Not on file  . Number of children: 2  . Years of education: some college  . Highest education level: Not on file  Occupational History  . Occupation: retired  Tobacco Use  . Smoking status: Never Smoker  . Smokeless tobacco: Never Used  Vaping Use  . Vaping Use: Never used  Substance and Sexual Activity  . Alcohol use: No    Alcohol/week: 0.0 standard drinks  . Drug use: No  . Sexual activity: Not on file  Other Topics Concern  . Not on file  Social History Narrative   Married   HH 2   2 children; 3 granchildren; 2 great grandchildren   Social Determinants of Health   Financial Resource Strain: Low Risk   . Difficulty of Paying Living Expenses: Not hard at all  Food Insecurity: No Food Insecurity  . Worried About Programme researcher, broadcasting/film/video in the Last Year: Never true  . Ran Out of Food in the Last Year: Never true  Transportation Needs: No Transportation Needs  . Lack of Transportation (Medical): No  . Lack of Transportation (Non-Medical): No  Physical Activity:   . Days of Exercise per Week:   . Minutes of Exercise per Session:   Stress: No Stress Concern Present  . Feeling of Stress : Only a little  Social Connections: Socially Integrated  . Frequency of Communication with Friends and Family: More than three times a week  . Frequency of Social Gatherings with Friends and Family: Once a week  . Attends Religious Services: More than 4 times per year  . Active Member of Clubs or Organizations: Yes  . Attends Banker Meetings: More than 4 times per year  . Marital Status: Married  Catering manager Violence:   . Fear of Current or Ex-Partner:   . Emotionally Abused:   Marland Kitchen Physically Abused:   . Sexually Abused:     Family History  Problem Relation Age of Onset  . Leukemia Mother   . Heart disease Father    . Heart disease Sister   . Colon cancer Neg Hx   . Stomach cancer Neg Hx   . Rectal cancer Neg Hx   . Esophageal cancer Neg Hx     ROS: Knee and back pain but no fevers or chills, productive cough, hemoptysis, dysphasia, odynophagia, melena, hematochezia, dysuria, hematuria, rash, seizure activity, orthopnea, PND, pedal edema, claudication. Remaining systems are negative.  Physical Exam: Well-developed well-nourished in no acute distress.  Skin is warm and dry.  HEENT is normal.  Neck is supple.  Chest is clear to auscultation  with normal expansion.  Cardiovascular exam is regular rate and rhythm.  Abdominal exam nontender or distended. No masses palpated. Extremities show no edema. neuro grossly intact  A/P  1 hypertension-patient's blood pressure is controlled.  Continue present medical regimen.  2 history of chest pain-recent nuclear study showed no ischemia.  No recurrent symptoms.  3 prior abnormal electrocardiogram-anterior infarct cannot be excluded.  However echocardiogram showed normal LV function and nuclear study shows no infarct.  4 hyperlipidemia-managed by primary care.  5 dizziness-does not appear to be related to cardiac status.  Follow-up primary care.  6 thoracic aortic aneurysm plan follow-up CTA May 2022.  Olga Millers, MD

## 2019-12-06 DIAGNOSIS — J301 Allergic rhinitis due to pollen: Secondary | ICD-10-CM | POA: Diagnosis not present

## 2019-12-10 DIAGNOSIS — J301 Allergic rhinitis due to pollen: Secondary | ICD-10-CM | POA: Diagnosis not present

## 2019-12-10 DIAGNOSIS — J3089 Other allergic rhinitis: Secondary | ICD-10-CM | POA: Diagnosis not present

## 2019-12-11 ENCOUNTER — Encounter: Payer: Self-pay | Admitting: Cardiology

## 2019-12-11 ENCOUNTER — Ambulatory Visit: Payer: Medicare PPO | Admitting: Cardiology

## 2019-12-11 ENCOUNTER — Other Ambulatory Visit: Payer: Self-pay

## 2019-12-11 VITALS — BP 122/58 | HR 55 | Ht 70.0 in | Wt 200.0 lb

## 2019-12-11 DIAGNOSIS — I712 Thoracic aortic aneurysm, without rupture, unspecified: Secondary | ICD-10-CM

## 2019-12-11 DIAGNOSIS — R9431 Abnormal electrocardiogram [ECG] [EKG]: Secondary | ICD-10-CM

## 2019-12-11 DIAGNOSIS — I10 Essential (primary) hypertension: Secondary | ICD-10-CM | POA: Diagnosis not present

## 2019-12-11 NOTE — Patient Instructions (Signed)
Medication Instructions:  NO CHANGE *If you need a refill on your cardiac medications before your next appointment, please call your pharmacy*   Lab Work: If you have labs (blood work) drawn today and your tests are completely normal, you will receive your results only by: . MyChart Message (if you have MyChart) OR . A paper copy in the mail If you have any lab test that is abnormal or we need to change your treatment, we will call you to review the results.   Follow-Up: At [facility], you and your health needs are our priority.  As part of our continuing mission to provide you with exceptional heart care, we have created designated Provider Care Teams.  These Care Teams include your primary Cardiologist (physician) and Advanced Practice Providers (APPs -  Physician Assistants and Nurse Practitioners) who all work together to provide you with the care you need, when you need it.  We recommend signing up for the patient portal called "MyChart".  Sign up information is provided on this After Visit Summary.  MyChart is used to connect with patients for Virtual Visits (Telemedicine).  Patients are able to view lab/test results, encounter notes, upcoming appointments, etc.  Non-urgent messages can be sent to your provider as well.   To learn more about what you can do with MyChart, go to [email or web address].    Your next appointment:   12 month(s)  The format for your next appointment:   In Person  Provider:   Brian Crenshaw, MD    

## 2019-12-12 DIAGNOSIS — J3089 Other allergic rhinitis: Secondary | ICD-10-CM | POA: Diagnosis not present

## 2019-12-12 DIAGNOSIS — J301 Allergic rhinitis due to pollen: Secondary | ICD-10-CM | POA: Diagnosis not present

## 2019-12-26 DIAGNOSIS — H60311 Diffuse otitis externa, right ear: Secondary | ICD-10-CM | POA: Diagnosis not present

## 2019-12-26 DIAGNOSIS — H6641 Suppurative otitis media, unspecified, right ear: Secondary | ICD-10-CM | POA: Diagnosis not present

## 2019-12-26 DIAGNOSIS — H6611 Chronic tubotympanic suppurative otitis media, right ear: Secondary | ICD-10-CM | POA: Diagnosis not present

## 2019-12-26 DIAGNOSIS — H903 Sensorineural hearing loss, bilateral: Secondary | ICD-10-CM | POA: Diagnosis not present

## 2019-12-26 DIAGNOSIS — Z96649 Presence of unspecified artificial hip joint: Secondary | ICD-10-CM | POA: Diagnosis not present

## 2020-01-06 DIAGNOSIS — J301 Allergic rhinitis due to pollen: Secondary | ICD-10-CM | POA: Diagnosis not present

## 2020-01-21 DIAGNOSIS — J301 Allergic rhinitis due to pollen: Secondary | ICD-10-CM | POA: Diagnosis not present

## 2020-01-22 ENCOUNTER — Institutional Professional Consult (permissible substitution): Payer: Medicare PPO | Admitting: Pulmonary Disease

## 2020-01-27 DIAGNOSIS — J301 Allergic rhinitis due to pollen: Secondary | ICD-10-CM | POA: Diagnosis not present

## 2020-02-05 ENCOUNTER — Ambulatory Visit (INDEPENDENT_AMBULATORY_CARE_PROVIDER_SITE_OTHER): Payer: Medicare PPO | Admitting: Podiatry

## 2020-02-05 ENCOUNTER — Encounter: Payer: Self-pay | Admitting: Podiatry

## 2020-02-05 ENCOUNTER — Other Ambulatory Visit: Payer: Self-pay

## 2020-02-05 DIAGNOSIS — M79674 Pain in right toe(s): Secondary | ICD-10-CM

## 2020-02-05 DIAGNOSIS — B351 Tinea unguium: Secondary | ICD-10-CM

## 2020-02-05 DIAGNOSIS — M79675 Pain in left toe(s): Secondary | ICD-10-CM

## 2020-02-05 NOTE — Progress Notes (Signed)
This patient returns to my office for at risk foot care.  This patient requires this care by a professional since this patient will be at risk due to having neuropathy and he takes gabapentin.  This patient is unable to cut nails himself since the patient cannot reach his nails.These nails are painful walking and wearing shoes.  This patient presents for at risk foot care today. He also relates having callus under his big toe joint even though there is no pain today.  General Appearance  Alert, conversant and in no acute stress.  Vascular  Dorsalis pedis and posterior tibial  pulses are palpable  bilaterally.  Capillary return is within normal limits  bilaterally. Temperature is within normal limits  bilaterally.  Neurologic  Senn-Weinstein monofilament wire test within normal limits  bilaterally. Muscle power within normal limits bilaterally.  Nails Thick disfigured discolored nails with subungual debris  from hallux to fifth toes bilaterally. No evidence of bacterial infection or drainage bilaterally.  Orthopedic  No limitations of motion  feet .  No crepitus or effusions noted.  No bony pathology or digital deformities noted.  Skin  normotropic skin with no porokeratosis noted bilaterally.  No signs of infections or ulcers noted.   Asymptomatic callus sub 1st MPJ  B/L.  Onychomycosis  Pain in right toes  Pain in left toes  Consent was obtained for treatment procedures.   Mechanical debridement of nails 1-5  bilaterally performed with a nail nipper.  Filed with dremel without incident.    Return office visit                     Told patient to return for periodic foot care and evaluation due to potential at risk complications.   Helane Gunther DPM

## 2020-02-06 DIAGNOSIS — H6641 Suppurative otitis media, unspecified, right ear: Secondary | ICD-10-CM | POA: Diagnosis not present

## 2020-02-06 DIAGNOSIS — H90A22 Sensorineural hearing loss, unilateral, left ear, with restricted hearing on the contralateral side: Secondary | ICD-10-CM | POA: Diagnosis not present

## 2020-02-06 DIAGNOSIS — H6983 Other specified disorders of Eustachian tube, bilateral: Secondary | ICD-10-CM | POA: Diagnosis not present

## 2020-02-06 DIAGNOSIS — H6621 Chronic atticoantral suppurative otitis media, right ear: Secondary | ICD-10-CM | POA: Diagnosis not present

## 2020-02-06 DIAGNOSIS — Z9622 Myringotomy tube(s) status: Secondary | ICD-10-CM | POA: Diagnosis not present

## 2020-02-06 DIAGNOSIS — Z570 Occupational exposure to noise: Secondary | ICD-10-CM | POA: Diagnosis not present

## 2020-02-06 DIAGNOSIS — H90A31 Mixed conductive and sensorineural hearing loss, unilateral, right ear with restricted hearing on the contralateral side: Secondary | ICD-10-CM | POA: Diagnosis not present

## 2020-02-06 DIAGNOSIS — Z8614 Personal history of Methicillin resistant Staphylococcus aureus infection: Secondary | ICD-10-CM | POA: Diagnosis not present

## 2020-02-06 DIAGNOSIS — H903 Sensorineural hearing loss, bilateral: Secondary | ICD-10-CM | POA: Diagnosis not present

## 2020-02-10 ENCOUNTER — Other Ambulatory Visit: Payer: Self-pay | Admitting: Family Medicine

## 2020-02-12 DIAGNOSIS — H748X3 Other specified disorders of middle ear and mastoid, bilateral: Secondary | ICD-10-CM | POA: Diagnosis not present

## 2020-02-12 DIAGNOSIS — H7390 Unspecified disorder of tympanic membrane, unspecified ear: Secondary | ICD-10-CM | POA: Diagnosis not present

## 2020-02-13 DIAGNOSIS — J301 Allergic rhinitis due to pollen: Secondary | ICD-10-CM | POA: Diagnosis not present

## 2020-02-20 DIAGNOSIS — J301 Allergic rhinitis due to pollen: Secondary | ICD-10-CM | POA: Diagnosis not present

## 2020-02-23 ENCOUNTER — Other Ambulatory Visit: Payer: Self-pay | Admitting: Family Medicine

## 2020-03-12 DIAGNOSIS — J301 Allergic rhinitis due to pollen: Secondary | ICD-10-CM | POA: Diagnosis not present

## 2020-03-18 ENCOUNTER — Encounter: Payer: Self-pay | Admitting: Family Medicine

## 2020-03-18 ENCOUNTER — Other Ambulatory Visit: Payer: Self-pay

## 2020-03-18 ENCOUNTER — Ambulatory Visit (INDEPENDENT_AMBULATORY_CARE_PROVIDER_SITE_OTHER): Payer: Medicare PPO | Admitting: Family Medicine

## 2020-03-18 VITALS — Temp 98.6°F | Ht 70.0 in | Wt 200.0 lb

## 2020-03-18 DIAGNOSIS — Z23 Encounter for immunization: Secondary | ICD-10-CM | POA: Diagnosis not present

## 2020-03-20 DIAGNOSIS — H6983 Other specified disorders of Eustachian tube, bilateral: Secondary | ICD-10-CM | POA: Diagnosis not present

## 2020-03-20 DIAGNOSIS — H6611 Chronic tubotympanic suppurative otitis media, right ear: Secondary | ICD-10-CM | POA: Diagnosis not present

## 2020-03-20 DIAGNOSIS — H6641 Suppurative otitis media, unspecified, right ear: Secondary | ICD-10-CM | POA: Diagnosis not present

## 2020-03-20 DIAGNOSIS — Z22322 Carrier or suspected carrier of Methicillin resistant Staphylococcus aureus: Secondary | ICD-10-CM | POA: Diagnosis not present

## 2020-03-20 DIAGNOSIS — H90A31 Mixed conductive and sensorineural hearing loss, unilateral, right ear with restricted hearing on the contralateral side: Secondary | ICD-10-CM | POA: Diagnosis not present

## 2020-03-20 DIAGNOSIS — H7011 Chronic mastoiditis, right ear: Secondary | ICD-10-CM | POA: Diagnosis not present

## 2020-04-01 DIAGNOSIS — J301 Allergic rhinitis due to pollen: Secondary | ICD-10-CM | POA: Diagnosis not present

## 2020-04-01 DIAGNOSIS — J3089 Other allergic rhinitis: Secondary | ICD-10-CM | POA: Diagnosis not present

## 2020-04-01 DIAGNOSIS — J3081 Allergic rhinitis due to animal (cat) (dog) hair and dander: Secondary | ICD-10-CM | POA: Diagnosis not present

## 2020-04-17 DIAGNOSIS — J301 Allergic rhinitis due to pollen: Secondary | ICD-10-CM | POA: Diagnosis not present

## 2020-04-26 ENCOUNTER — Other Ambulatory Visit: Payer: Self-pay | Admitting: Family Medicine

## 2020-04-29 ENCOUNTER — Ambulatory Visit: Payer: Medicare PPO | Admitting: Podiatry

## 2020-04-29 ENCOUNTER — Other Ambulatory Visit: Payer: Self-pay

## 2020-04-29 ENCOUNTER — Encounter: Payer: Self-pay | Admitting: Podiatry

## 2020-04-29 DIAGNOSIS — B351 Tinea unguium: Secondary | ICD-10-CM | POA: Diagnosis not present

## 2020-04-29 DIAGNOSIS — G629 Polyneuropathy, unspecified: Secondary | ICD-10-CM

## 2020-04-29 DIAGNOSIS — L84 Corns and callosities: Secondary | ICD-10-CM

## 2020-04-29 DIAGNOSIS — J301 Allergic rhinitis due to pollen: Secondary | ICD-10-CM | POA: Diagnosis not present

## 2020-04-29 DIAGNOSIS — D689 Coagulation defect, unspecified: Secondary | ICD-10-CM | POA: Diagnosis not present

## 2020-04-29 DIAGNOSIS — M79675 Pain in left toe(s): Secondary | ICD-10-CM | POA: Diagnosis not present

## 2020-04-29 DIAGNOSIS — M79674 Pain in right toe(s): Secondary | ICD-10-CM

## 2020-04-29 NOTE — Progress Notes (Signed)
This patient returns to my office for at risk foot care.  This patient requires this care by a professional since this patient will be at risk due to having neuropathy and he takes gabapentin.  This patient is unable to cut nails himself since the patient cannot reach his nails.These nails are painful walking and wearing shoes.  This patient presents for at risk foot care today. Patient had concerns about his previous surgery right foot.  General Appearance  Alert, conversant and in no acute stress.  Vascular  Dorsalis pedis and posterior tibial  pulses are palpable  bilaterally.  Capillary return is within normal limits  bilaterally. Temperature is within normal limits  bilaterally.  Neurologic  Senn-Weinstein monofilament wire test within normal limits  bilaterally. Muscle power within normal limits bilaterally.  Nails Thick disfigured discolored nails with subungual debris  from hallux to fifth toes bilaterally. No evidence of bacterial infection or drainage bilaterally.  Orthopedic  No limitations of motion  feet .  No crepitus or effusions noted.  No bony pathology or digital deformities noted.  Skin  normotropic skin with no porokeratosis noted bilaterally.  No signs of infections or ulcers noted.   Asymptomatic callus sub 1st MPJ  B/L.  Onychomycosis  Pain in right toes  Pain in left toes  Consent was obtained for treatment procedures.   Mechanical debridement of nails 1-5  bilaterally performed with a nail nipper.  Filed with dremel without incident. Patient was instructed to make an appointment with podiatric surgeons to discuss his right foot.   Return office visit    9 weeks                  Told patient to return for periodic foot care and evaluation due to potential at risk complications.   Helane Gunther DPM

## 2020-04-30 DIAGNOSIS — M65341 Trigger finger, right ring finger: Secondary | ICD-10-CM | POA: Diagnosis not present

## 2020-04-30 DIAGNOSIS — H90A31 Mixed conductive and sensorineural hearing loss, unilateral, right ear with restricted hearing on the contralateral side: Secondary | ICD-10-CM | POA: Diagnosis not present

## 2020-04-30 DIAGNOSIS — H7011 Chronic mastoiditis, right ear: Secondary | ICD-10-CM | POA: Diagnosis not present

## 2020-04-30 DIAGNOSIS — Z4589 Encounter for adjustment and management of other implanted devices: Secondary | ICD-10-CM | POA: Diagnosis not present

## 2020-05-19 DIAGNOSIS — J301 Allergic rhinitis due to pollen: Secondary | ICD-10-CM | POA: Diagnosis not present

## 2020-05-21 IMAGING — DX DG CHEST 2V
2 series · 2 of 2 positions shown · non-contrast
Comparison: 02/08/2013

CLINICAL DATA: Fever X 2 days, cough, congestion, pain in the
middle right back area. Non-smoker.

EXAM:
CHEST - 2 VIEW

[chest pa]
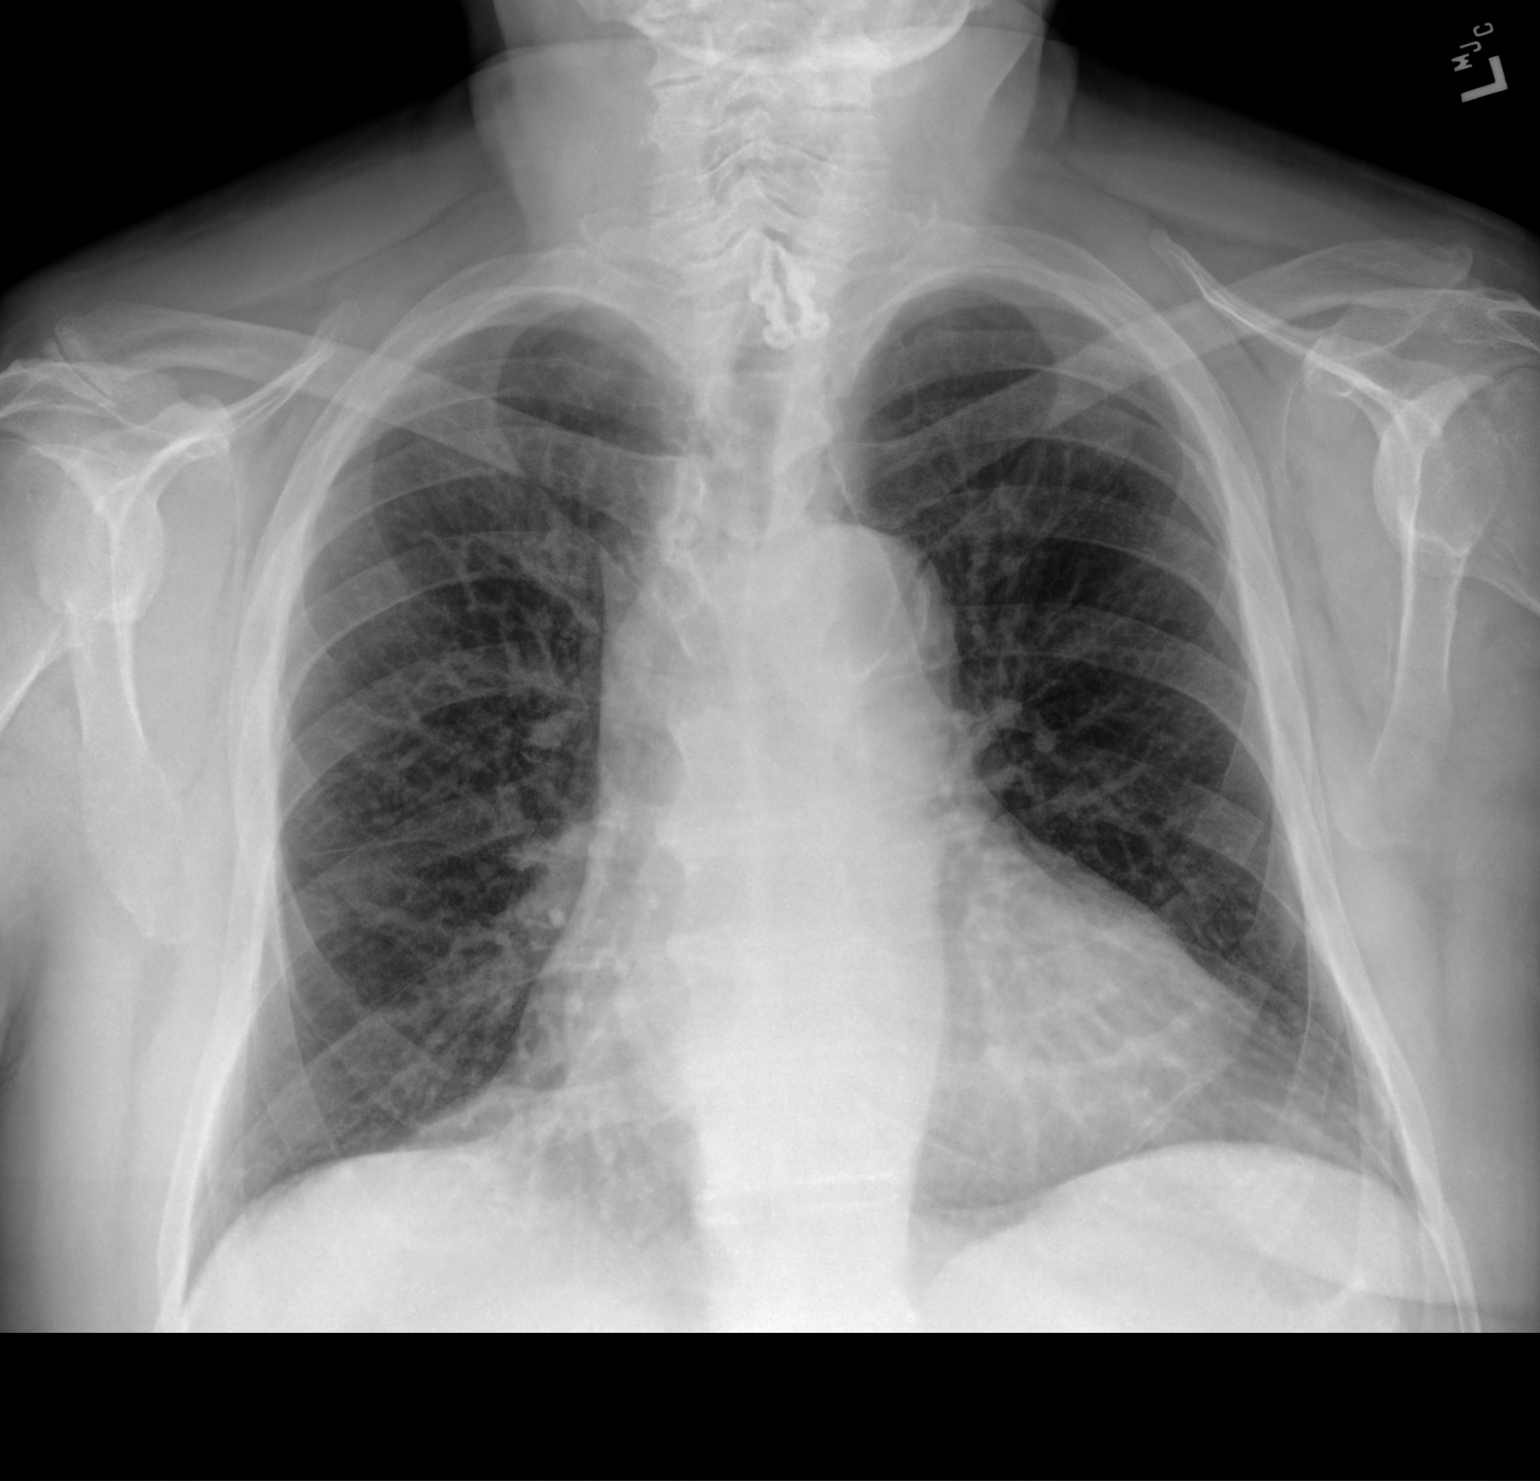

[chest lat]
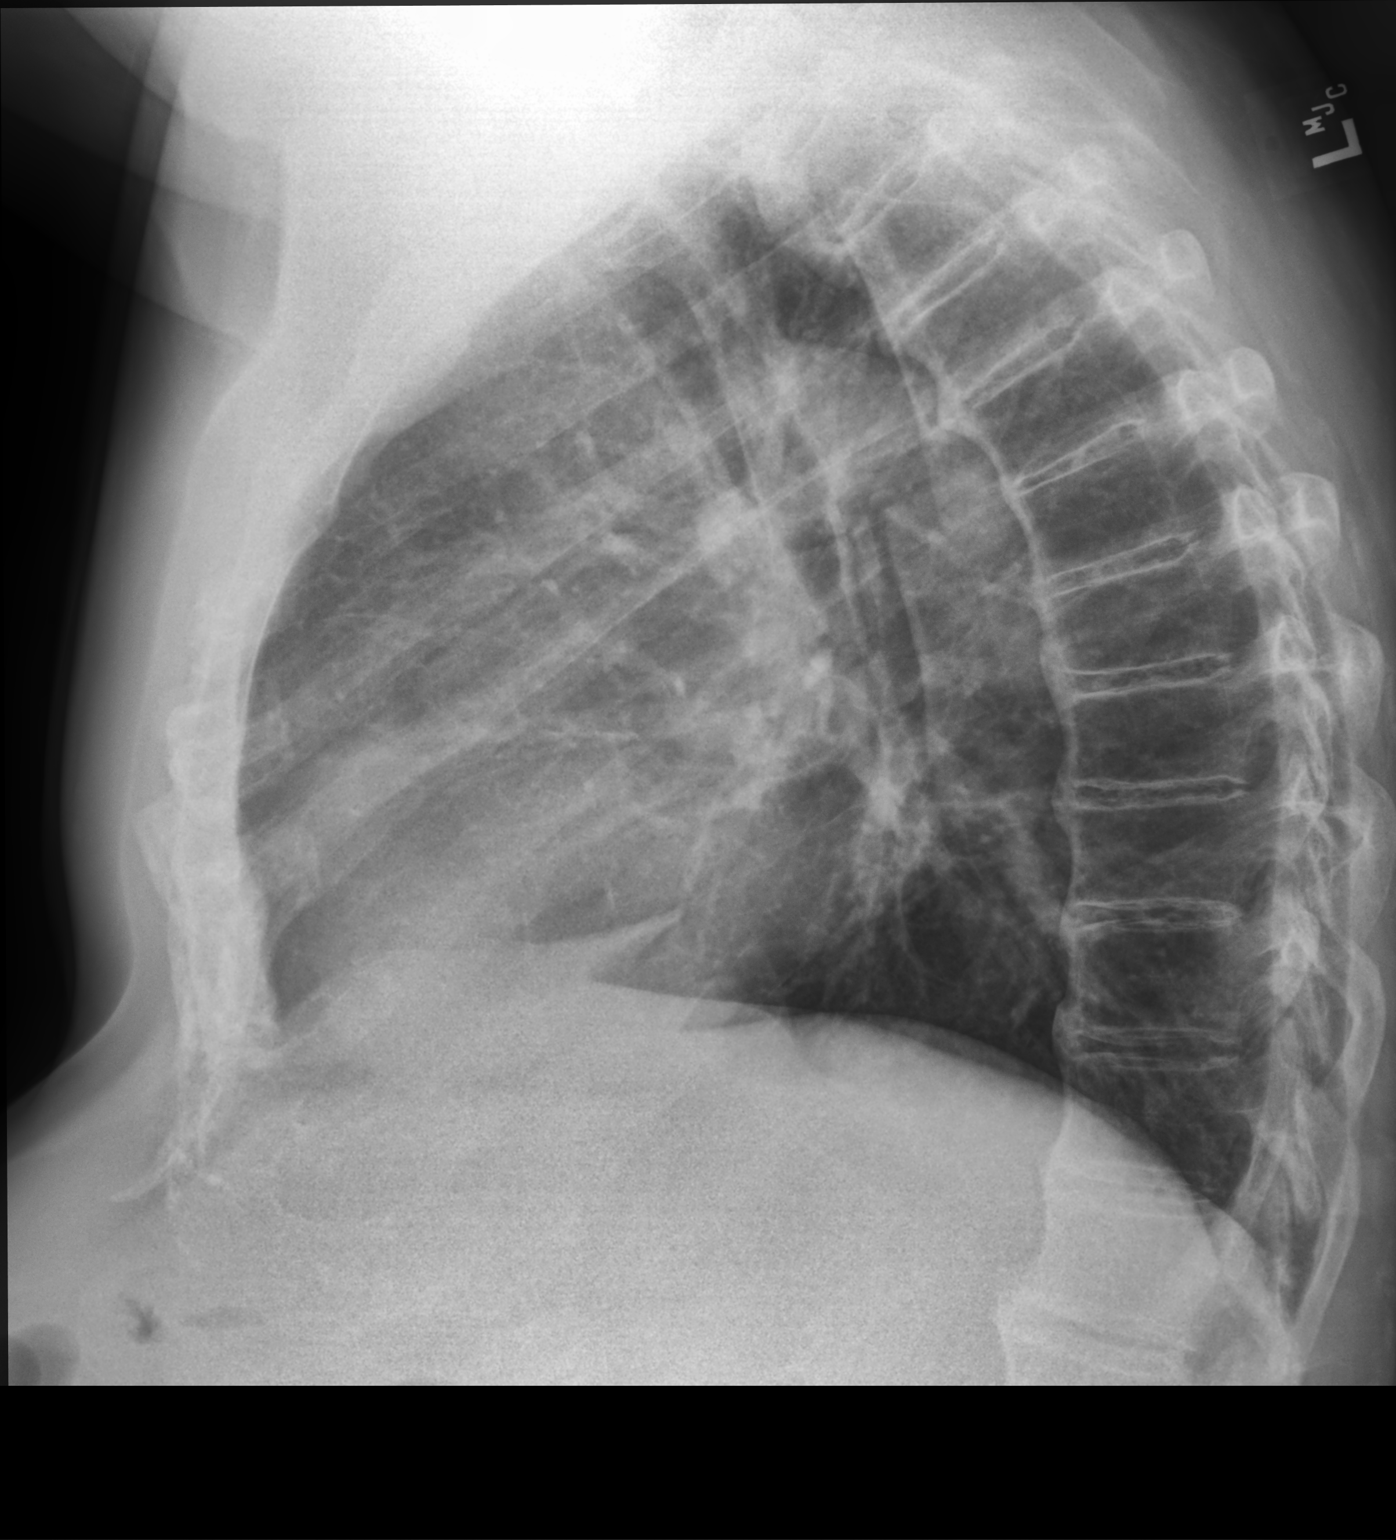

[2 of 2 positions shown; findings below may reference images not displayed]

FINDINGS: Lungs are clear.

Heart size upper limits normal. Aortic Atherosclerosis
(YP97L-170.0).

No effusion.  No pneumothorax.

Cervical fixation hardware. Anterior vertebral endplate spurring at
multiple levels in the mid and lower thoracic spine.
IMPRESSION: No acute cardiopulmonary disease.

## 2020-05-28 DIAGNOSIS — M65341 Trigger finger, right ring finger: Secondary | ICD-10-CM | POA: Diagnosis not present

## 2020-06-04 DIAGNOSIS — J301 Allergic rhinitis due to pollen: Secondary | ICD-10-CM | POA: Diagnosis not present

## 2020-06-08 ENCOUNTER — Other Ambulatory Visit: Payer: Self-pay | Admitting: Family Medicine

## 2020-06-24 ENCOUNTER — Ambulatory Visit: Payer: Self-pay

## 2020-06-24 ENCOUNTER — Other Ambulatory Visit: Payer: Self-pay

## 2020-06-24 MED ORDER — ESOMEPRAZOLE MAGNESIUM 40 MG PO CPDR: DELAYED_RELEASE_CAPSULE | ORAL | 3 refills | Status: DC

## 2020-06-25 DIAGNOSIS — J301 Allergic rhinitis due to pollen: Secondary | ICD-10-CM | POA: Diagnosis not present

## 2020-07-01 ENCOUNTER — Ambulatory Visit: Payer: Medicare PPO | Admitting: Podiatry

## 2020-07-02 DIAGNOSIS — H6983 Other specified disorders of Eustachian tube, bilateral: Secondary | ICD-10-CM | POA: Diagnosis not present

## 2020-07-02 DIAGNOSIS — H7011 Chronic mastoiditis, right ear: Secondary | ICD-10-CM | POA: Diagnosis not present

## 2020-07-02 DIAGNOSIS — Z4589 Encounter for adjustment and management of other implanted devices: Secondary | ICD-10-CM | POA: Diagnosis not present

## 2020-07-02 DIAGNOSIS — Z9622 Myringotomy tube(s) status: Secondary | ICD-10-CM | POA: Diagnosis not present

## 2020-07-02 DIAGNOSIS — H90A31 Mixed conductive and sensorineural hearing loss, unilateral, right ear with restricted hearing on the contralateral side: Secondary | ICD-10-CM | POA: Diagnosis not present

## 2020-07-06 ENCOUNTER — Ambulatory Visit (INDEPENDENT_AMBULATORY_CARE_PROVIDER_SITE_OTHER): Payer: Medicare PPO

## 2020-07-06 ENCOUNTER — Encounter: Payer: Self-pay | Admitting: Podiatry

## 2020-07-06 ENCOUNTER — Other Ambulatory Visit: Payer: Self-pay

## 2020-07-06 ENCOUNTER — Ambulatory Visit (INDEPENDENT_AMBULATORY_CARE_PROVIDER_SITE_OTHER): Payer: Medicare PPO | Admitting: Podiatry

## 2020-07-06 DIAGNOSIS — D689 Coagulation defect, unspecified: Secondary | ICD-10-CM | POA: Diagnosis not present

## 2020-07-06 DIAGNOSIS — M779 Enthesopathy, unspecified: Secondary | ICD-10-CM

## 2020-07-06 DIAGNOSIS — L84 Corns and callosities: Secondary | ICD-10-CM | POA: Diagnosis not present

## 2020-07-06 DIAGNOSIS — B351 Tinea unguium: Secondary | ICD-10-CM | POA: Diagnosis not present

## 2020-07-06 DIAGNOSIS — M79674 Pain in right toe(s): Secondary | ICD-10-CM

## 2020-07-06 DIAGNOSIS — M7751 Other enthesopathy of right foot: Secondary | ICD-10-CM

## 2020-07-06 DIAGNOSIS — M79675 Pain in left toe(s): Secondary | ICD-10-CM

## 2020-07-07 NOTE — Progress Notes (Signed)
Subjective:   Patient ID: Isaiah Wilson, male   DOB: 86 y.o.   MRN: [id number]   HPI Patient presents concerned with a lot of pain in the bottom of his right foot with fluid buildup and has lesions on both feet and nails that are thick and he cannot cut himself with pain   ROS      Objective:  Physical Exam  Neurovascular status intact with laboratory changes around the first MPJ plantar right with fluid buildup with keratotic lesion subone subfive bilaterally     Assessment:  Inflammatory capsulitis of the first MPJ plantar right with lesion formation bilateral     Plan:  H&P reviewed condition debrided the lesions and did careful sterile injection of the plantar capsule right 3 mg dexamethasone Kenalog 5 mg Xylocaine and debrided nailbeds 1-5 both feet no iatrogenic bleeding noted

## 2020-07-20 DIAGNOSIS — J301 Allergic rhinitis due to pollen: Secondary | ICD-10-CM | POA: Diagnosis not present

## 2020-07-21 DIAGNOSIS — H90A31 Mixed conductive and sensorineural hearing loss, unilateral, right ear with restricted hearing on the contralateral side: Secondary | ICD-10-CM | POA: Diagnosis not present

## 2020-07-21 DIAGNOSIS — H90A22 Sensorineural hearing loss, unilateral, left ear, with restricted hearing on the contralateral side: Secondary | ICD-10-CM | POA: Diagnosis not present

## 2020-08-04 DIAGNOSIS — D225 Melanocytic nevi of trunk: Secondary | ICD-10-CM | POA: Diagnosis not present

## 2020-08-04 DIAGNOSIS — L218 Other seborrheic dermatitis: Secondary | ICD-10-CM | POA: Diagnosis not present

## 2020-08-04 DIAGNOSIS — C44519 Basal cell carcinoma of skin of other part of trunk: Secondary | ICD-10-CM | POA: Diagnosis not present

## 2020-08-07 DIAGNOSIS — J301 Allergic rhinitis due to pollen: Secondary | ICD-10-CM | POA: Diagnosis not present

## 2020-08-12 DIAGNOSIS — J3089 Other allergic rhinitis: Secondary | ICD-10-CM | POA: Diagnosis not present

## 2020-08-12 DIAGNOSIS — J301 Allergic rhinitis due to pollen: Secondary | ICD-10-CM | POA: Diagnosis not present

## 2020-08-12 DIAGNOSIS — H1045 Other chronic allergic conjunctivitis: Secondary | ICD-10-CM | POA: Diagnosis not present

## 2020-08-14 DIAGNOSIS — G4719 Other hypersomnia: Secondary | ICD-10-CM | POA: Diagnosis not present

## 2020-08-14 DIAGNOSIS — K219 Gastro-esophageal reflux disease without esophagitis: Secondary | ICD-10-CM | POA: Diagnosis not present

## 2020-08-14 DIAGNOSIS — H7011 Chronic mastoiditis, right ear: Secondary | ICD-10-CM | POA: Diagnosis not present

## 2020-08-14 DIAGNOSIS — R0683 Snoring: Secondary | ICD-10-CM | POA: Diagnosis not present

## 2020-08-14 DIAGNOSIS — Z79899 Other long term (current) drug therapy: Secondary | ICD-10-CM | POA: Diagnosis not present

## 2020-08-14 DIAGNOSIS — H6641 Suppurative otitis media, unspecified, right ear: Secondary | ICD-10-CM | POA: Diagnosis not present

## 2020-08-14 DIAGNOSIS — I10 Essential (primary) hypertension: Secondary | ICD-10-CM | POA: Diagnosis not present

## 2020-08-14 DIAGNOSIS — E785 Hyperlipidemia, unspecified: Secondary | ICD-10-CM | POA: Diagnosis not present

## 2020-08-14 DIAGNOSIS — Z9189 Other specified personal risk factors, not elsewhere classified: Secondary | ICD-10-CM | POA: Diagnosis not present

## 2020-08-14 DIAGNOSIS — H66002 Acute suppurative otitis media without spontaneous rupture of ear drum, left ear: Secondary | ICD-10-CM | POA: Diagnosis not present

## 2020-08-14 DIAGNOSIS — H906 Mixed conductive and sensorineural hearing loss, bilateral: Secondary | ICD-10-CM | POA: Diagnosis not present

## 2020-08-18 DIAGNOSIS — J301 Allergic rhinitis due to pollen: Secondary | ICD-10-CM | POA: Diagnosis not present

## 2020-08-19 DIAGNOSIS — I10 Essential (primary) hypertension: Secondary | ICD-10-CM | POA: Diagnosis not present

## 2020-08-19 DIAGNOSIS — H66002 Acute suppurative otitis media without spontaneous rupture of ear drum, left ear: Secondary | ICD-10-CM | POA: Diagnosis not present

## 2020-08-19 DIAGNOSIS — E785 Hyperlipidemia, unspecified: Secondary | ICD-10-CM | POA: Diagnosis not present

## 2020-08-19 DIAGNOSIS — H90A22 Sensorineural hearing loss, unilateral, left ear, with restricted hearing on the contralateral side: Secondary | ICD-10-CM | POA: Diagnosis not present

## 2020-08-19 DIAGNOSIS — K219 Gastro-esophageal reflux disease without esophagitis: Secondary | ICD-10-CM | POA: Diagnosis not present

## 2020-08-19 DIAGNOSIS — H663X1 Other chronic suppurative otitis media, right ear: Secondary | ICD-10-CM | POA: Diagnosis not present

## 2020-08-19 DIAGNOSIS — Z4582 Encounter for adjustment or removal of myringotomy device (stent) (tube): Secondary | ICD-10-CM | POA: Diagnosis not present

## 2020-08-19 DIAGNOSIS — Z79899 Other long term (current) drug therapy: Secondary | ICD-10-CM | POA: Diagnosis not present

## 2020-08-19 DIAGNOSIS — H906 Mixed conductive and sensorineural hearing loss, bilateral: Secondary | ICD-10-CM | POA: Diagnosis not present

## 2020-08-19 DIAGNOSIS — H90A31 Mixed conductive and sensorineural hearing loss, unilateral, right ear with restricted hearing on the contralateral side: Secondary | ICD-10-CM | POA: Diagnosis not present

## 2020-08-19 DIAGNOSIS — H7011 Chronic mastoiditis, right ear: Secondary | ICD-10-CM | POA: Diagnosis not present

## 2020-08-19 DIAGNOSIS — H6641 Suppurative otitis media, unspecified, right ear: Secondary | ICD-10-CM | POA: Diagnosis not present

## 2020-08-20 DIAGNOSIS — K219 Gastro-esophageal reflux disease without esophagitis: Secondary | ICD-10-CM | POA: Diagnosis not present

## 2020-08-20 DIAGNOSIS — H7011 Chronic mastoiditis, right ear: Secondary | ICD-10-CM | POA: Diagnosis not present

## 2020-08-20 DIAGNOSIS — E785 Hyperlipidemia, unspecified: Secondary | ICD-10-CM | POA: Diagnosis not present

## 2020-08-20 DIAGNOSIS — I10 Essential (primary) hypertension: Secondary | ICD-10-CM | POA: Diagnosis not present

## 2020-08-20 DIAGNOSIS — H66002 Acute suppurative otitis media without spontaneous rupture of ear drum, left ear: Secondary | ICD-10-CM | POA: Diagnosis not present

## 2020-08-20 DIAGNOSIS — H6641 Suppurative otitis media, unspecified, right ear: Secondary | ICD-10-CM | POA: Diagnosis not present

## 2020-08-20 DIAGNOSIS — Z79899 Other long term (current) drug therapy: Secondary | ICD-10-CM | POA: Diagnosis not present

## 2020-08-20 DIAGNOSIS — H906 Mixed conductive and sensorineural hearing loss, bilateral: Secondary | ICD-10-CM | POA: Diagnosis not present

## 2020-08-27 DIAGNOSIS — Z9622 Myringotomy tube(s) status: Secondary | ICD-10-CM | POA: Diagnosis not present

## 2020-08-27 DIAGNOSIS — H6641 Suppurative otitis media, unspecified, right ear: Secondary | ICD-10-CM | POA: Diagnosis not present

## 2020-08-27 DIAGNOSIS — H7011 Chronic mastoiditis, right ear: Secondary | ICD-10-CM | POA: Diagnosis not present

## 2020-08-31 DIAGNOSIS — J301 Allergic rhinitis due to pollen: Secondary | ICD-10-CM | POA: Diagnosis not present

## 2020-09-05 DIAGNOSIS — J301 Allergic rhinitis due to pollen: Secondary | ICD-10-CM | POA: Diagnosis not present

## 2020-09-05 DIAGNOSIS — J3089 Other allergic rhinitis: Secondary | ICD-10-CM | POA: Diagnosis not present

## 2020-09-07 ENCOUNTER — Encounter: Payer: Self-pay | Admitting: Podiatry

## 2020-09-07 ENCOUNTER — Ambulatory Visit (INDEPENDENT_AMBULATORY_CARE_PROVIDER_SITE_OTHER): Payer: Medicare PPO | Admitting: Podiatry

## 2020-09-07 ENCOUNTER — Other Ambulatory Visit: Payer: Self-pay

## 2020-09-07 DIAGNOSIS — B351 Tinea unguium: Secondary | ICD-10-CM | POA: Diagnosis not present

## 2020-09-07 DIAGNOSIS — D689 Coagulation defect, unspecified: Secondary | ICD-10-CM

## 2020-09-07 DIAGNOSIS — M79674 Pain in right toe(s): Secondary | ICD-10-CM

## 2020-09-07 DIAGNOSIS — M79675 Pain in left toe(s): Secondary | ICD-10-CM | POA: Diagnosis not present

## 2020-09-07 DIAGNOSIS — L84 Corns and callosities: Secondary | ICD-10-CM | POA: Diagnosis not present

## 2020-09-08 NOTE — Progress Notes (Signed)
Subjective:   Patient ID: Isaiah Wilson, male   DOB: 86 y.o.   MRN: [id number]   HPI Patient presents with chronic nail disease bilateral and lesions on both feet that are sore around the fifth metatarsal head   ROS      Objective:  Physical Exam  Neurovascular status unchanged with thick yellow brittle nailbeds 1-5 both feet and lesions plantar bilateral     Assessment:  Chronic nail disease with chronic lesions bilateral     Plan:  At risk patient on blood thinner I debrided nails debrided lesions no iatrogenic bleeding reappoint routine care

## 2020-09-15 DIAGNOSIS — Z08 Encounter for follow-up examination after completed treatment for malignant neoplasm: Secondary | ICD-10-CM | POA: Diagnosis not present

## 2020-09-15 DIAGNOSIS — L218 Other seborrheic dermatitis: Secondary | ICD-10-CM | POA: Diagnosis not present

## 2020-09-15 DIAGNOSIS — J301 Allergic rhinitis due to pollen: Secondary | ICD-10-CM | POA: Diagnosis not present

## 2020-09-15 DIAGNOSIS — J3089 Other allergic rhinitis: Secondary | ICD-10-CM | POA: Diagnosis not present

## 2020-09-15 DIAGNOSIS — Z85828 Personal history of other malignant neoplasm of skin: Secondary | ICD-10-CM | POA: Diagnosis not present

## 2020-09-18 DIAGNOSIS — H9191 Unspecified hearing loss, right ear: Secondary | ICD-10-CM | POA: Diagnosis not present

## 2020-09-18 DIAGNOSIS — H9211 Otorrhea, right ear: Secondary | ICD-10-CM | POA: Diagnosis not present

## 2020-09-21 ENCOUNTER — Encounter: Payer: Self-pay | Admitting: *Deleted

## 2020-09-21 NOTE — Telephone Encounter (Signed)
Left message for pt to call, follow up CTA is due in May for thoracic aneurysm.

## 2020-09-23 ENCOUNTER — Telehealth: Payer: Self-pay | Admitting: Cardiology

## 2020-09-23 DIAGNOSIS — I712 Thoracic aortic aneurysm, without rupture, unspecified: Secondary | ICD-10-CM

## 2020-09-23 NOTE — Telephone Encounter (Signed)
Pt called in regards to scheduling his MRI, but there's no order in the system. Pt states he was called about scheduling the MRI several days ago and he's just getting around to returning the call. Please advise

## 2020-09-23 NOTE — Telephone Encounter (Signed)
Spoke with pt, Order placed. Aware someone will contact him to schedule.

## 2020-09-24 ENCOUNTER — Other Ambulatory Visit: Payer: Self-pay | Admitting: *Deleted

## 2020-09-24 ENCOUNTER — Telehealth: Payer: Self-pay | Admitting: Cardiology

## 2020-09-24 DIAGNOSIS — I712 Thoracic aortic aneurysm, without rupture, unspecified: Secondary | ICD-10-CM

## 2020-09-24 NOTE — Telephone Encounter (Signed)
Spoke with patient regarding the Tuesday 10/13/20 1:40 pm CTA chest/aorta at [facility] [address] , [address]---arrival time is 1:20 pm for check in-----Liquids only 4 hours prior to study.  Patient aware to have lab work 1 week prior to exam.

## 2020-09-29 NOTE — Telephone Encounter (Signed)
This encounter was created in error - please disregard.

## 2020-10-01 ENCOUNTER — Other Ambulatory Visit: Payer: Self-pay | Admitting: Family Medicine

## 2020-10-01 DIAGNOSIS — J301 Allergic rhinitis due to pollen: Secondary | ICD-10-CM | POA: Diagnosis not present

## 2020-10-01 DIAGNOSIS — J3089 Other allergic rhinitis: Secondary | ICD-10-CM | POA: Diagnosis not present

## 2020-10-07 DIAGNOSIS — I712 Thoracic aortic aneurysm, without rupture: Secondary | ICD-10-CM | POA: Diagnosis not present

## 2020-10-07 DIAGNOSIS — J301 Allergic rhinitis due to pollen: Secondary | ICD-10-CM | POA: Diagnosis not present

## 2020-10-08 ENCOUNTER — Encounter: Payer: Self-pay | Admitting: *Deleted

## 2020-10-08 LAB — BASIC METABOLIC PANEL
BUN/Creatinine Ratio: 14 (ref 10–24)
BUN: 16 mg/dL (ref 8–27)
CO2: 24 mmol/L (ref 20–29)
Calcium: 9.3 mg/dL (ref 8.6–10.2)
Chloride: 109 mmol/L — ABNORMAL HIGH (ref 96–106)
Creatinine, Ser: 1.13 mg/dL (ref 0.76–1.27)
Glucose: 104 mg/dL — ABNORMAL HIGH (ref 65–99)
Potassium: 4.3 mmol/L (ref 3.5–5.2)
Sodium: 145 mmol/L — ABNORMAL HIGH (ref 134–144)
eGFR: 64 mL/min/{1.73_m2} (ref >59)

## 2020-10-09 ENCOUNTER — Telehealth: Payer: Self-pay | Admitting: Family Medicine

## 2020-10-09 DIAGNOSIS — H6611 Chronic tubotympanic suppurative otitis media, right ear: Secondary | ICD-10-CM | POA: Diagnosis not present

## 2020-10-09 DIAGNOSIS — Z4589 Encounter for adjustment and management of other implanted devices: Secondary | ICD-10-CM | POA: Diagnosis not present

## 2020-10-09 DIAGNOSIS — H90A31 Mixed conductive and sensorineural hearing loss, unilateral, right ear with restricted hearing on the contralateral side: Secondary | ICD-10-CM | POA: Diagnosis not present

## 2020-10-09 DIAGNOSIS — H90A22 Sensorineural hearing loss, unilateral, left ear, with restricted hearing on the contralateral side: Secondary | ICD-10-CM | POA: Diagnosis not present

## 2020-10-09 DIAGNOSIS — Z9889 Other specified postprocedural states: Secondary | ICD-10-CM | POA: Diagnosis not present

## 2020-10-09 DIAGNOSIS — H9211 Otorrhea, right ear: Secondary | ICD-10-CM | POA: Diagnosis not present

## 2020-10-09 DIAGNOSIS — Z9089 Acquired absence of other organs: Secondary | ICD-10-CM | POA: Diagnosis not present

## 2020-10-09 NOTE — Telephone Encounter (Signed)
Spoke to spouse to  schedule Medicare Annual Wellness Visit (AWV) either virtually or in office.  See stated pt at another appointment will call back to schedule   Last AWV 06/11/19  please schedule at anytime with [facility] Nurse Health Advisor 1 or 2   This should be a 45 minute visit.

## 2020-10-13 ENCOUNTER — Ambulatory Visit
Admission: RE | Admit: 2020-10-13 | Discharge: 2020-10-13 | Disposition: A | Discharge status: Home / Self Care | Payer: Medicare PPO | Source: Ambulatory Visit | Attending: Cardiology | Admitting: Cardiology

## 2020-10-13 DIAGNOSIS — I712 Thoracic aortic aneurysm, without rupture, unspecified: Secondary | ICD-10-CM

## 2020-10-13 DIAGNOSIS — J929 Pleural plaque without asbestos: Secondary | ICD-10-CM | POA: Diagnosis not present

## 2020-10-13 DIAGNOSIS — K449 Diaphragmatic hernia without obstruction or gangrene: Secondary | ICD-10-CM | POA: Diagnosis not present

## 2020-10-13 DIAGNOSIS — I2510 Atherosclerotic heart disease of native coronary artery without angina pectoris: Secondary | ICD-10-CM | POA: Diagnosis not present

## 2020-10-13 MED ORDER — IOPAMIDOL (ISOVUE-370) INJECTION 76%
75.0000 mL | Freq: Once | INTRAVENOUS | Status: AC | PRN
  Administered 2020-10-13: 75 mL via INTRAVENOUS

## 2020-10-15 ENCOUNTER — Other Ambulatory Visit: Payer: Self-pay | Admitting: Family Medicine

## 2020-10-16 ENCOUNTER — Other Ambulatory Visit: Payer: Self-pay

## 2020-10-19 ENCOUNTER — Ambulatory Visit: Payer: Medicare PPO | Admitting: Family Medicine

## 2020-10-27 DIAGNOSIS — J301 Allergic rhinitis due to pollen: Secondary | ICD-10-CM | POA: Diagnosis not present

## 2020-10-29 DIAGNOSIS — R21 Rash and other nonspecific skin eruption: Secondary | ICD-10-CM | POA: Diagnosis not present

## 2020-10-29 DIAGNOSIS — H7011 Chronic mastoiditis, right ear: Secondary | ICD-10-CM | POA: Diagnosis not present

## 2020-10-29 DIAGNOSIS — Z01118 Encounter for examination of ears and hearing with other abnormal findings: Secondary | ICD-10-CM | POA: Diagnosis not present

## 2020-10-29 DIAGNOSIS — H9211 Otorrhea, right ear: Secondary | ICD-10-CM | POA: Diagnosis not present

## 2020-10-29 DIAGNOSIS — Z9622 Myringotomy tube(s) status: Secondary | ICD-10-CM | POA: Diagnosis not present

## 2020-10-29 DIAGNOSIS — Z8614 Personal history of Methicillin resistant Staphylococcus aureus infection: Secondary | ICD-10-CM | POA: Diagnosis not present

## 2020-10-29 DIAGNOSIS — Z9009 Acquired absence of other part of head and neck: Secondary | ICD-10-CM | POA: Diagnosis not present

## 2020-10-29 DIAGNOSIS — H90A22 Sensorineural hearing loss, unilateral, left ear, with restricted hearing on the contralateral side: Secondary | ICD-10-CM | POA: Diagnosis not present

## 2020-10-29 DIAGNOSIS — H90A31 Mixed conductive and sensorineural hearing loss, unilateral, right ear with restricted hearing on the contralateral side: Secondary | ICD-10-CM | POA: Diagnosis not present

## 2020-10-30 ENCOUNTER — Telehealth: Payer: Self-pay | Admitting: Family Medicine

## 2020-10-30 NOTE — Telephone Encounter (Signed)
Pt is calling to give information from Dr. Ermalinda Barrios would like to have him to reevaluated the pts blood pressure medication due to the pt having dizziness and w/no other symptoms.  Pt has an appointment scheduled for 10/04/2020 at 2:15 pm.

## 2020-11-02 DIAGNOSIS — J301 Allergic rhinitis due to pollen: Secondary | ICD-10-CM | POA: Diagnosis not present

## 2020-11-04 ENCOUNTER — Encounter: Payer: Self-pay | Admitting: Family Medicine

## 2020-11-04 ENCOUNTER — Ambulatory Visit: Payer: Medicare PPO | Admitting: Family Medicine

## 2020-11-04 ENCOUNTER — Other Ambulatory Visit: Payer: Self-pay

## 2020-11-04 VITALS — BP 120/60 | HR 50 | Temp 97.9°F | Wt 239.4 lb

## 2020-11-04 DIAGNOSIS — I10 Essential (primary) hypertension: Secondary | ICD-10-CM | POA: Diagnosis not present

## 2020-11-04 DIAGNOSIS — R5383 Other fatigue: Secondary | ICD-10-CM | POA: Diagnosis not present

## 2020-11-04 DIAGNOSIS — R42 Dizziness and giddiness: Secondary | ICD-10-CM | POA: Diagnosis not present

## 2020-11-04 LAB — CBC WITH DIFFERENTIAL/PLATELET
Basophils Absolute: 0.1 K/uL (ref 0.0–0.1)
Basophils Relative: 0.8 % (ref 0.0–3.0)
Eosinophils Absolute: 0.3 K/uL (ref 0.0–0.7)
Eosinophils Relative: 3 % (ref 0.0–5.0)
HCT: 39.1 % (ref 39.0–52.0)
Hemoglobin: 13.2 g/dL (ref 13.0–17.0)
Lymphocytes Relative: 30.2 % (ref 12.0–46.0)
Lymphs Abs: 2.7 K/uL (ref 0.7–4.0)
MCHC: 33.7 g/dL (ref 30.0–36.0)
MCV: 86.5 fl (ref 78.0–100.0)
Monocytes Absolute: 0.8 K/uL (ref 0.1–1.0)
Monocytes Relative: 8.6 % (ref 3.0–12.0)
Neutro Abs: 5.2 K/uL (ref 1.4–7.7)
Neutrophils Relative %: 57.4 % (ref 43.0–77.0)
Platelets: 180 K/uL (ref 150.0–400.0)
RBC: 4.52 Mil/uL (ref 4.22–5.81)
RDW: 14.6 % (ref 11.5–15.5)
WBC: 9 K/uL (ref 4.0–10.5)

## 2020-11-04 LAB — BASIC METABOLIC PANEL
BUN: 15 mg/dL (ref 6–23)
CO2: 25 mEq/L (ref 19–32)
Calcium: 8.8 mg/dL (ref 8.4–10.5)
Chloride: 106 mEq/L (ref 96–112)
Creatinine, Ser: 1.04 mg/dL (ref 0.40–1.50)
GFR: 66.37 mL/min (ref >60.00)
Glucose, Bld: 118 mg/dL — ABNORMAL HIGH (ref 70–99)
Potassium: 3.8 mEq/L (ref 3.5–5.1)
Sodium: 140 mEq/L (ref 135–145)

## 2020-11-04 LAB — TSH: TSH: 2.69 u[IU]/mL (ref 0.35–4.50)

## 2020-11-04 LAB — VITAMIN B12: Vitamin B-12: 402 pg/mL (ref 211–911)

## 2020-11-04 NOTE — Patient Instructions (Signed)
Dizziness Dizziness is a common problem. It is a feeling of unsteadiness or light-headedness. You may feel like you are about to faint. Dizziness can lead to injury if you stumble or fall. Anyone can become dizzy, but dizziness is more common in older adults. This condition can be caused by a number of things, including medicines, dehydration, or illness. Follow these instructions at home: Eating and drinking  Drink enough fluid to keep your urine clear or pale yellow. This helps to keep you from becoming dehydrated. Try to drink more clear fluids, such as water.  Do not drink alcohol.  Limit your caffeine intake if told to do so by your health care provider. Check ingredients and nutrition facts to see if a food or beverage contains caffeine.  Limit your salt (sodium) intake if told to do so by your health care provider. Check ingredients and nutrition facts to see if a food or beverage contains sodium. Activity  Avoid making quick movements. ? Rise slowly from chairs and steady yourself until you feel okay. ? In the morning, first sit up on the side of the bed. When you feel okay, stand slowly while you hold onto something until you know that your balance is fine.  If you need to stand in one place for a long time, move your legs often. Tighten and relax the muscles in your legs while you are standing.  Do not drive or use heavy machinery if you feel dizzy.  Avoid bending down if you feel dizzy. Place items in your home so that they are easy for you to reach without leaning over. Lifestyle  Do not use any products that contain nicotine or tobacco, such as cigarettes and e-cigarettes. If you need help quitting, ask your health care provider.  Try to reduce your stress level by using methods such as yoga or meditation. Talk with your health care provider if you need help to manage your stress. General instructions  Watch your dizziness for any changes.  Take over-the-counter and  prescription medicines only as told by your health care provider. Talk with your health care provider if you think that your dizziness is caused by a medicine that you are taking.  Tell a friend or a family member that you are feeling dizzy. If he or she notices any changes in your behavior, have this person call your health care provider.  Keep all follow-up visits as told by your health care provider. This is important. Contact a health care provider if:  Your dizziness does not go away.  Your dizziness or light-headedness gets worse.  You feel nauseous.  You have reduced hearing.  You have new symptoms.  You are unsteady on your feet or you feel like the room is spinning. Get help right away if:  You vomit or have diarrhea and are unable to eat or drink anything.  You have problems talking, walking, swallowing, or using your arms, hands, or legs.  You feel generally weak.  You are not thinking clearly or you have trouble forming sentences. It may take a friend or family member to notice this.  You have chest pain, abdominal pain, shortness of breath, or sweating.  Your vision changes.  You have any bleeding.  You have a severe headache.  You have neck pain or a stiff neck.  You have a fever. These symptoms may represent a serious problem that is an emergency. Do not wait to see if the symptoms will go away. Get medical help   right away. Call your local emergency services (911 in the [place] Do not drive yourself to the hospital. Summary  Dizziness is a feeling of unsteadiness or light-headedness. This condition can be caused by a number of things, including medicines, dehydration, or illness.  Anyone can become dizzy, but dizziness is more common in older adults.  Drink enough fluid to keep your urine clear or pale yellow. Do not drink alcohol.  Avoid making quick movements if you feel dizzy. Monitor your dizziness for any changes. This information is not intended to  replace advice given to you by your health care provider. Make sure you discuss any questions you have with your health care provider. Document Revised: 05/19/2017 Document Reviewed: 06/18/2016 Elsevier Patient Education  2021 Elsevier Inc.  

## 2020-11-04 NOTE — Addendum Note (Signed)
Addended by: Kandra Nicolas on: 11/04/2020 02:51 PM   Modules accepted: Orders

## 2020-11-04 NOTE — Progress Notes (Signed)
Established Patient Office Visit  Subjective:  Patient ID: Isaiah Wilson, male    DOB: Oct 22, 1934  Age: 86 y.o. MRN: [id number]  CC:  Chief Complaint  Patient presents with  . Dizziness    Dizzy spells, fatigue, x 2 weeks, was told that this could have something to do with his BP    HPI Isaiah Wilson presents for increased dizziness and fatigue.  His wife has microscopic polyangiitis and has been doing this couple years and she has multiple health needs.  He states that he has had to attend to her 24/7 he thinks some of his fatigue is related to that.  He also is getting only about 5 and half hours sleep at night.  He frequently has to get up to urinate at night.  He has some lightheadedness.  He had seen his ENT.  He had mastoid surgery back in March and ENT suggested follow-up here.  Patient does not describe any consistent orthostatic type symptoms though.  He had recent hemoglobin slightly low at 13.0 and this was normocytic.  He does take chronic PPI with Nexium with no recent B12 level.  Occasional paresthesias in his hands.  Denies any vertigo symptoms.  Apparently placed on Cardura years ago by someone else.  He is on dosage of 8 mg but is not complaining of dizziness more at night and this is throughout the day.  He has hypertension which is treated with losartan 100 mg daily and amlodipine 5 mg daily.  Past Medical History:  Diagnosis Date  . Abnormal EKG    hx left anterior fasicular block on old ekg 2017  . Allergic rhinitis   . Allergy   . Barrett's esophagus   . Benign prostatic hypertrophy   . Colitis, ischemic ([place])    X 2  . Complication of anesthesia    SLOW TO AWAKEN AFTER 1 SURGERY 20  YRS AGO  . DJD (degenerative joint disease)    SPINE, AND OA  . GERD (gastroesophageal reflux disease)   . H/O hiatal hernia   . HOH (hard of hearing)    BOTH EARS  . Hyperlipidemia    no medication per pt.  . Hypertension    pt denies says cardura for bladder  .  Neuropathy     LEFT HAND AND ARM NUMB ALL THE TIME  . Overweight(278.02)     Past Surgical History:  Procedure Laterality Date  . 2 neck surgeries  2005-2006   Dr. Helane Gunther cx decompression, diskectomy,fusion C6-7 allograftWITH PLATE  . APPENDECTOMY    . EYE SURGERY     bilateral cataract surgerywith lens implant  . FOOT SURGERY     right x 3  . LAPAROSCOPIC CHOLECYSTECTOMY  1993   Dr. Charlynne Pander  . left total knee replacement  07/2007   Dr. Percell Miller   . right total knee replacement  2012   Dr. Maureen Ralphs  . SEPTOPLASTY  1975, May 2013   x3  . TONSILLECTOMY  as child  . TOTAL HIP ARTHROPLASTY Left 04/18/2018   Procedure: LEFT TOTAL HIP ARTHROPLASTY ANTERIOR APPROACH;  Surgeon: Gaynelle Arabian, MD;  Location: [facility];  Service: Orthopedics;  Laterality: Left;  . TOTAL HIP ARTHROPLASTY Right 03/13/2019   Procedure: TOTAL HIP ARTHROPLASTY ANTERIOR APPROACH;  Surgeon: Gaynelle Arabian, MD;  Location: [facility];  Service: Orthopedics;  Laterality: Right;  124mn  . TOTAL KNEE REVISION Left 11/11/2015   Procedure: LEFT KNEE POLYETHELENE REVISION;  Surgeon: FGaynelle Arabian MD;  Location:  [facility];  Service: Orthopedics;  Laterality: Left;  . TOTAL KNEE REVISION Left 12/07/2016   Procedure: LEFT TOTAL KNEE REVISION;  Surgeon: Gaynelle Arabian, MD;  Location: [facility];  Service: Orthopedics;  Laterality: Left;  Adductor Block    Family History  Problem Relation Age of Onset  . Leukemia Mother   . Heart disease Father   . Heart disease Sister   . Colon cancer Neg Hx   . Stomach cancer Neg Hx   . Rectal cancer Neg Hx   . Esophageal cancer Neg Hx     Social History   Socioeconomic History  . Marital status: Married    Spouse name: Not on file  . Number of children: 2  . Years of education: some college  . Highest education level: Not on file  Occupational History  . Occupation: retired  Tobacco Use  . Smoking status: Never Smoker  . Smokeless tobacco: Never Used  Vaping Use  . Vaping Use:  Never used  Substance and Sexual Activity  . Alcohol use: No    Alcohol/week: 0.0 standard drinks  . Drug use: No  . Sexual activity: Not on file  Other Topics Concern  . Not on file  Social History Narrative   Married   [place] 2   2 children; 3 granchildren; 2 great grandchildren   Social Determinants of Health   Financial Resource Strain: Not on file  Food Insecurity: Not on file  Transportation Needs: Not on file  Physical Activity: Not on file  Stress: Not on file  Social Connections: Not on file  Intimate Partner Violence: Not on file    Outpatient Medications Prior to Visit  Medication Sig Dispense Refill  . amLODipine (NORVASC) 5 MG tablet Take by mouth.    Marland Kitchen aspirin EC 81 MG tablet Take 81 mg by mouth daily.    Marland Kitchen azelastine (ASTELIN) 0.1 % nasal spray Place 1 spray into both nostrils 2 (two) times daily as needed (allergies.).     Marland Kitchen ciprofloxacin-dexamethasone (CIPRODEX) OTIC suspension     . doxazosin (CARDURA) 8 MG tablet TAKE ONE TABLET BY MOUTH EVERY NIGHT AT BEDTIME 60 tablet 1  . EPINEPHrine 0.3 mg/0.3 mL IJ SOAJ injection Inject 0.3 mg into the muscle as needed (allergic reaction).     Marland Kitchen esomeprazole (NEXIUM) 40 MG capsule TAKE ONE CAPSULE BY MOUTH EVERY EVENING BEFORE SUPPER 90 capsule 3  . fluticasone (FLONASE) 50 MCG/ACT nasal spray Place 2 sprays into both nostrils daily.     Marland Kitchen losartan (COZAAR) 100 MG tablet TAKE ONE TABLET BY MOUTH DAILY 180 tablet 1  . montelukast (SINGULAIR) 10 MG tablet TAKE ONE TABLET BY MOUTH EVERY NIGHT AT BEDTIME 90 tablet 0  . mupirocin ointment (BACTROBAN) 2 %     . sulfamethoxazole-trimethoprim (BACTRIM DS) 800-160 MG tablet     . tobramycin-dexamethasone (TOBRADEX) ophthalmic solution Apply to eye.    . gabapentin (NEURONTIN) 300 MG capsule Take by mouth.     No facility-administered medications prior to visit.    No Known Allergies  ROS Review of Systems  Constitutional: Positive for fatigue. Negative for chills, fever and  unexpected weight change.  Respiratory: Negative for cough and shortness of breath.   Cardiovascular: Negative for chest pain and palpitations.  Gastrointestinal: Negative for abdominal pain.  Neurological: Positive for dizziness.      Objective:    Physical Exam Vitals reviewed.  Constitutional:      Appearance: Normal appearance.  Cardiovascular:  Comments: Regular rhythm.  Palpated rate of 56 Pulmonary:     Effort: Pulmonary effort is normal.     Breath sounds: Normal breath sounds.  Musculoskeletal:     Cervical back: Neck supple.     Right lower leg: No edema.     Left lower leg: No edema.  Neurological:     General: No focal deficit present.     Mental Status: He is alert.     Cranial Nerves: No cranial nerve deficit.     BP 120/60 (BP Location: Left Arm, Patient Position: Sitting, Cuff Size: Normal)   Pulse (!) 50   Temp 97.9 F (36.6 C) (Oral)   Wt 239 lb 6.4 oz (108.6 kg)   SpO2 97%   BMI 34.35 kg/m  Wt Readings from Last 3 Encounters:  11/04/20 239 lb 6.4 oz (108.6 kg)  03/18/20 200 lb (90.7 kg)  12/11/19 200 lb (90.7 kg)     Health Maintenance Due  Topic Date Due  . Pneumococcal Vaccine 45-80 Years old (1 of 4 - PCV13) Never done  . TETANUS/TDAP  06/23/2019  . COVID-19 Vaccine (3 - Pfizer risk 4-dose series) 09/09/2020    There are no preventive care reminders to display for this patient.  Lab Results  Component Value Date   TSH 3.13 09/16/2019   Lab Results  Component Value Date   WBC 8.2 08/18/2019   HGB 13.2 08/18/2019   HCT 40.8 08/18/2019   MCV 87.7 08/18/2019   PLT 188 08/18/2019   Lab Results  Component Value Date   NA 145 (H) 10/07/2020   K 4.3 10/07/2020   CO2 24 10/07/2020   GLUCOSE 104 (H) 10/07/2020   BUN 16 10/07/2020   CREATININE 1.13 10/07/2020   BILITOT 0.7 08/18/2019   ALKPHOS 59 08/18/2019   AST 23 08/18/2019   ALT 18 08/18/2019   PROT 7.4 08/18/2019   ALBUMIN 4.2 08/18/2019   CALCIUM 9.3 10/07/2020    ANIONGAP 9 08/18/2019   EGFR 64 10/07/2020   GFR 80.70 09/16/2019   Lab Results  Component Value Date   CHOL 169 11/22/2017   Lab Results  Component Value Date   HDL 40.10 11/22/2017   Lab Results  Component Value Date   LDLCALC 96 11/22/2017   Lab Results  Component Value Date   TRIG 164.0 (H) 11/22/2017   Lab Results  Component Value Date   CHOLHDL 4 11/22/2017   Lab Results  Component Value Date   HGBA1C 5.7 (A) 02/07/2018      Assessment & Plan:   #1 dizziness.  This does not sound vestibular in nature.  He is also not describe any clear orthostatic symptoms.  He had blood pressure by me left arm seated normal size cuff 124/68 and standing 122/68.  He is on Cardura which has high risk of orthostatic symptoms but not clear his symptoms are orthostatic in nature.  -Check further labs with electrolytes, TSH, B12, CBC -Stay well-hydrated  #2 fatigue.  Likely multifactorial.  He has been extremely busy 24/7 caring for wife for months now and also probably inadequate sleep.  Check labs as above.  #3 hypertension stable and at goal -Continue amlodipine and losartan at current doses. -We did discuss possible reduction in Cardura to 4 mg daily but he is not describing any clear orthostatic type symptoms and downside may be risk of increasing BPH related symptoms  No orders of the defined types were placed in this encounter.   Follow-up: No follow-ups  on file.    Chayil Gantt, MD 

## 2020-11-06 ENCOUNTER — Telehealth: Payer: Self-pay | Admitting: Family Medicine

## 2020-11-06 NOTE — Telephone Encounter (Signed)
Pt is returning your call and would like for you to give him a call back.

## 2020-11-09 NOTE — Telephone Encounter (Addendum)
Called spoke with patient about 11/04/20,lab results. Voiced understanding.  Nothing further is needed.

## 2020-11-11 ENCOUNTER — Encounter: Payer: Self-pay | Admitting: Podiatry

## 2020-11-11 ENCOUNTER — Ambulatory Visit: Payer: Medicare PPO | Admitting: Podiatry

## 2020-11-11 ENCOUNTER — Other Ambulatory Visit: Payer: Self-pay

## 2020-11-11 DIAGNOSIS — M79674 Pain in right toe(s): Secondary | ICD-10-CM

## 2020-11-11 DIAGNOSIS — M79675 Pain in left toe(s): Secondary | ICD-10-CM

## 2020-11-11 DIAGNOSIS — L84 Corns and callosities: Secondary | ICD-10-CM | POA: Diagnosis not present

## 2020-11-11 DIAGNOSIS — B351 Tinea unguium: Secondary | ICD-10-CM

## 2020-11-11 DIAGNOSIS — D689 Coagulation defect, unspecified: Secondary | ICD-10-CM

## 2020-11-11 NOTE — Progress Notes (Signed)
Subjective:   Patient ID: Isaiah Wilson, male   DOB: 86 y.o.   MRN: [id number]   HPI Patient presents with thickened incurvated nailbeds 1-5 both feet that he states are painful he cannot cut and lesions on the first metatarsal head bilateral that he cannot take care of with patient on blood thinner   ROS      Objective:  Physical Exam  Neurovascular status intact with patient found to have thick yellow brittle nailbeds 1-5 both feet incurvated in the corners and keratotic lesions of first metatarsal head bilateral with patient on blood thinner     Assessment:  High risk patient with mycotic nail infection 1-5 both feet formation bilateral     Plan:  H&P debrided lesions debrided nails no iatrogenic bleeding reappoint routine care see back 9 weeks or earlier if needed  X-rays are negative for signs of pathology

## 2020-11-13 DIAGNOSIS — J301 Allergic rhinitis due to pollen: Secondary | ICD-10-CM | POA: Diagnosis not present

## 2020-11-19 DIAGNOSIS — J3089 Other allergic rhinitis: Secondary | ICD-10-CM | POA: Diagnosis not present

## 2020-11-19 DIAGNOSIS — J301 Allergic rhinitis due to pollen: Secondary | ICD-10-CM | POA: Diagnosis not present

## 2020-12-02 DIAGNOSIS — J301 Allergic rhinitis due to pollen: Secondary | ICD-10-CM | POA: Diagnosis not present

## 2020-12-03 NOTE — Progress Notes (Signed)
HPI: Follow-up hypertension. Brain MRI March 2021 with minor chronic microvascular ischemic changes.  Echocardiogram May 2021 showed normal LV function, severe left ventricular hypertrophy, grade 2 diastolic dysfunction, mild left atrial enlargement; mild aortic insufficiency and dilated ascending aorta at 44 mm.  Nuclear study May 2021 showed ejection fraction 62% and no ischemia.  CTA May 2022 showed 4.2 cm ascending aortic aneurysm and atherosclerosis including aortic and coronary disease. Since last seen there is no dyspnea, CP or syncope; dizzy when standing that only improves with sitting; he feels related to ear.  Current Outpatient Medications  Medication Sig Dispense Refill   acetaminophen (TYLENOL) 650 MG CR tablet Take 650 mg by mouth every 8 (eight) hours as needed for pain.     aspirin EC 81 MG tablet Take 81 mg by mouth daily.     azelastine (ASTELIN) 0.1 % nasal spray Place 1 spray into both nostrils 2 (two) times daily as needed (allergies.).      doxazosin (CARDURA) 8 MG tablet TAKE ONE TABLET BY MOUTH EVERY NIGHT AT BEDTIME 60 tablet 1   EPINEPHrine 0.3 mg/0.3 mL IJ SOAJ injection Inject 0.3 mg into the muscle as needed (allergic reaction).      esomeprazole (NEXIUM) 40 MG capsule TAKE ONE CAPSULE BY MOUTH EVERY EVENING BEFORE SUPPER 90 capsule 3   fluticasone (FLONASE) 50 MCG/ACT nasal spray Place 2 sprays into both nostrils daily.      losartan (COZAAR) 100 MG tablet TAKE ONE TABLET BY MOUTH DAILY 180 tablet 1   montelukast (SINGULAIR) 10 MG tablet TAKE ONE TABLET BY MOUTH EVERY NIGHT AT BEDTIME 90 tablet 0   zinc gluconate 50 MG tablet Take 50 mg by mouth daily.     amLODipine (NORVASC) 5 MG tablet Take by mouth. (Patient not taking: Reported on 12/09/2020)     ciprofloxacin-dexamethasone (CIPRODEX) OTIC suspension  (Patient not taking: Reported on 12/09/2020)     mupirocin ointment (BACTROBAN) 2 %  (Patient not taking: Reported on 12/09/2020)      sulfamethoxazole-trimethoprim (BACTRIM DS) 800-160 MG tablet  (Patient not taking: Reported on 12/09/2020)     tobramycin-dexamethasone (TOBRADEX) ophthalmic solution Apply to eye. (Patient not taking: Reported on 12/09/2020)     No current facility-administered medications for this visit.     Past Medical History:  Diagnosis Date   Abnormal EKG    hx left anterior fasicular block on old ekg 2017   Allergic rhinitis    Allergy    Barrett's esophagus    Benign prostatic hypertrophy    Colitis, ischemic ([facility])    X 2   Complication of anesthesia    SLOW TO AWAKEN AFTER 1 SURGERY 20  YRS AGO   DJD (degenerative joint disease)    SPINE, AND OA   GERD (gastroesophageal reflux disease)    H/O hiatal hernia    HOH (hard of hearing)    BOTH EARS   Hyperlipidemia    no medication per pt.   Hypertension    pt denies says cardura for bladder   Neuropathy     LEFT HAND AND ARM NUMB ALL THE TIME   Overweight(278.02)     Past Surgical History:  Procedure Laterality Date   2 neck surgeries  2005-2006   Dr. Georgia Dom cx decompression, diskectomy,fusion C6-7 allograftWITH PLATE   APPENDECTOMY     EYE SURGERY     bilateral cataract surgerywith lens implant   FOOT SURGERY     right x 3  LAPAROSCOPIC CHOLECYSTECTOMY  1993   Dr. Luan Moore   left total knee replacement  07/2007   Dr. Eulah Pont    right total knee replacement  2012   Dr. Romie Jumper  1975, May 2013   x3   TONSILLECTOMY  as child   TOTAL HIP ARTHROPLASTY Left 04/18/2018   Procedure: LEFT TOTAL HIP ARTHROPLASTY ANTERIOR APPROACH;  Surgeon: Ollen Gross, MD;  Location: [facility];  Service: Orthopedics;  Laterality: Left;   TOTAL HIP ARTHROPLASTY Right 03/13/2019   Procedure: TOTAL HIP ARTHROPLASTY ANTERIOR APPROACH;  Surgeon: Ollen Gross, MD;  Location: [facility];  Service: Orthopedics;  Laterality: Right;    TOTAL KNEE REVISION Left 11/11/2015   Procedure: LEFT KNEE POLYETHELENE REVISION;  Surgeon: Ollen Gross, MD;  Location: [facility];  Service: Orthopedics;  Laterality: Left;   TOTAL KNEE REVISION Left 12/07/2016   Procedure: LEFT TOTAL KNEE REVISION;  Surgeon: Ollen Gross, MD;  Location: [facility];  Service: Orthopedics;  Laterality: Left;  Adductor Block    Social History   Socioeconomic History   Marital status: Married    Spouse name: Not on file   Number of children: 2   Years of education: some college   Highest education level: Not on file  Occupational History   Occupation: retired  Tobacco Use   Smoking status: Never   Smokeless tobacco: Never  Vaping Use   Vaping Use: Never used  Substance and Sexual Activity   Alcohol use: No    Alcohol/week: 0.0 standard drinks   Drug use: No   Sexual activity: Not on file  Other Topics Concern   Not on file  Social History Narrative   Married   [place] 2   2 children; 3 granchildren; 2 great grandchildren   Social Determinants of Health   Financial Resource Strain: Not on file  Food Insecurity: Not on file  Transportation Needs: Not on file  Physical Activity: Not on file  Stress: Not on file  Social Connections: Not on file  Intimate Partner Violence: Not on file    Family History  Problem Relation Age of Onset   Leukemia Mother    Heart disease Father    Heart disease Sister    Colon cancer Neg Hx    Stomach cancer Neg Hx    Rectal cancer Neg Hx    Esophageal cancer Neg Hx     ROS: no fevers or chills, productive cough, hemoptysis, dysphasia, odynophagia, melena, hematochezia, dysuria, hematuria, rash, seizure activity, orthopnea, PND, pedal edema, claudication. Remaining systems are negative.  Physical Exam: Well-developed well-nourished in no acute distress.  Skin is warm and dry.  HEENT is normal.  Neck is supple.  Chest is clear to auscultation with normal expansion.  Cardiovascular exam is regular rate and rhythm.  Abdominal exam nontender or distended. No masses palpated. Extremities show no  edema. neuro grossly intact  ECG- Sinus with pacs, first degree AV block, LAFB; personally reviewed  A/P  1 previous chest pain-nuclear study showed no ischemia and he has had no recurrent symptoms. Recent CTA showed coronary calcification.   2 hypertension-blood pressure controlled.  Continue present medications and follow. Some dizziness with standing that continues until he sits; he will forward BP to [place] when he is symptomatic though doesn't sound BP mediated.   3 thoracic aortic aneurysm-no change on most recent CTA.  Plan follow-up study May 2023.  4 history of dizziness-felt not to be cardiac related.  5 history of hyperlipidemia-Given  coronary calcification, will add crestor 20 mg daily; lipids and liver 12 weeks  Olga Millers, MD

## 2020-12-09 ENCOUNTER — Ambulatory Visit: Payer: Medicare PPO | Admitting: Cardiology

## 2020-12-09 ENCOUNTER — Other Ambulatory Visit: Payer: Self-pay

## 2020-12-09 ENCOUNTER — Encounter: Payer: Self-pay | Admitting: Cardiology

## 2020-12-09 VITALS — BP 142/80 | HR 54 | Ht 71.0 in | Wt 239.0 lb

## 2020-12-09 DIAGNOSIS — E785 Hyperlipidemia, unspecified: Secondary | ICD-10-CM | POA: Diagnosis not present

## 2020-12-09 DIAGNOSIS — I712 Thoracic aortic aneurysm, without rupture, unspecified: Secondary | ICD-10-CM

## 2020-12-09 DIAGNOSIS — I10 Essential (primary) hypertension: Secondary | ICD-10-CM

## 2020-12-09 DIAGNOSIS — I2510 Atherosclerotic heart disease of native coronary artery without angina pectoris: Secondary | ICD-10-CM | POA: Diagnosis not present

## 2020-12-09 MED ORDER — ROSUVASTATIN CALCIUM 20 MG PO TABS: 20.0000 mg | ORAL_TABLET | Freq: Every day | ORAL | 3 refills | Status: DC

## 2020-12-09 NOTE — Patient Instructions (Signed)
Medication Instructions:   START ROSUVASTATIN 20 MG ONCE DAILY  *If you need a refill on your cardiac medications before your next appointment, please call your pharmacy*   Lab Work:  Your physician recommends that you return for lab work in: 3 MONTHS-FASTING  If you have labs (blood work) drawn today and your tests are completely normal, you will receive your results only by: MyChart Message (if you have MyChart) OR A paper copy in the mail If you have any lab test that is abnormal or we need to change your treatment, we will call you to review the results.   Follow-Up: At [facility], you and your health needs are our priority.  As part of our continuing mission to provide you with exceptional heart care, we have created designated Provider Care Teams.  These Care Teams include your primary Cardiologist (physician) and Advanced Practice Providers (APPs -  Physician Assistants and Nurse Practitioners) who all work together to provide you with the care you need, when you need it.  We recommend signing up for the patient portal called "MyChart".  Sign up information is provided on this After Visit Summary.  MyChart is used to connect with patients for Virtual Visits (Telemedicine).  Patients are able to view lab/test results, encounter notes, upcoming appointments, etc.  Non-urgent messages can be sent to your provider as well.   To learn more about what you can do with MyChart, go to [email or web address].    Your next appointment:   12 month(s)  The format for your next appointment:   In Person  Provider:   Olga Millers, MD

## 2020-12-18 DIAGNOSIS — J301 Allergic rhinitis due to pollen: Secondary | ICD-10-CM | POA: Diagnosis not present

## 2020-12-22 DIAGNOSIS — D0462 Carcinoma in situ of skin of left upper limb, including shoulder: Secondary | ICD-10-CM | POA: Diagnosis not present

## 2020-12-22 DIAGNOSIS — L309 Dermatitis, unspecified: Secondary | ICD-10-CM | POA: Diagnosis not present

## 2020-12-22 DIAGNOSIS — D0461 Carcinoma in situ of skin of right upper limb, including shoulder: Secondary | ICD-10-CM | POA: Diagnosis not present

## 2020-12-23 ENCOUNTER — Telehealth: Payer: Self-pay | Admitting: Family Medicine

## 2020-12-23 NOTE — Telephone Encounter (Signed)
Left message for patient to call back and schedule Medicare Annual Wellness Visit (AWV) either virtually or in office.   Last AWV 06/11/19  please schedule at anytime with [facility] Nurse Health Advisor 1 or 2   This should be a 45 minute visit.

## 2020-12-23 NOTE — Telephone Encounter (Signed)
The patients will have to call back later to schedule. He has enough of appointments  going on right now for his wife.

## 2021-01-07 ENCOUNTER — Other Ambulatory Visit: Payer: Self-pay | Admitting: Cardiology

## 2021-01-13 ENCOUNTER — Ambulatory Visit: Payer: Medicare PPO | Admitting: Podiatry

## 2021-01-15 DIAGNOSIS — J301 Allergic rhinitis due to pollen: Secondary | ICD-10-CM | POA: Diagnosis not present

## 2021-01-19 DIAGNOSIS — M65341 Trigger finger, right ring finger: Secondary | ICD-10-CM | POA: Diagnosis not present

## 2021-01-22 ENCOUNTER — Telehealth: Payer: Self-pay

## 2021-01-22 DIAGNOSIS — J301 Allergic rhinitis due to pollen: Secondary | ICD-10-CM | POA: Diagnosis not present

## 2021-01-22 NOTE — Telephone Encounter (Signed)
Documented on spreadsheet 

## 2021-01-22 NOTE — Telephone Encounter (Signed)
Called patient x3 for MWV,  no answer and no voice mail available.   Patient may reschedule next available appointment.    L.Pilar Westergaard,LPN

## 2021-01-22 NOTE — Progress Notes (Deleted)
Subjective:   Isaiah Wilson is a 86 y.o. male who presents for an Subsequent  Medicare Annual Wellness Visit.   I connected with [name] today by telephone and verified that I am speaking with the correct person using two identifiers. Location patient: home Location provider: work Persons participating in the virtual visit: patient, provider.   I discussed the limitations, risks, security and privacy concerns of performing an evaluation and management service by telephone and the availability of in person appointments. I also discussed with the patient that there may be a patient responsible charge related to this service. The patient expressed understanding and verbally consented to this telephonic visit.    Interactive audio and video telecommunications were attempted between this provider and patient, however failed, due to patient having technical difficulties OR patient did not have access to video capability.  We continued and completed visit with audio only.    Review of Systems   n/a       Objective:    There were no vitals filed for this visit. There is no height or weight on file to calculate BMI.  Advanced Directives 08/18/2019 06/11/2019 03/13/2019 03/07/2019 04/18/2018 04/12/2018 02/07/2018  Does Patient Have a Medical Advance Directive? Yes No Yes No No No Yes  Type of Diplomatic Services operational officer - Healthcare Power of Attorney - - - -  Does patient want to make changes to medical advance directive? - - No - Patient declined - - - -  Copy of Healthcare Power of Attorney in Chart? - - No - copy requested - - - -  Would patient like information on creating a medical advance directive? - No - Patient declined No - Patient declined No - Patient declined No - Patient declined No - Patient declined -    Current Medications (verified) Outpatient Encounter Medications as of 01/22/2021  Medication Sig   acetaminophen (TYLENOL) 650 MG CR tablet Take  650 mg by mouth every 8 (eight) hours as needed for pain.   amLODipine (NORVASC) 5 MG tablet TAKE ONE TABLET BY MOUTH DAILY   aspirin EC 81 MG tablet Take 81 mg by mouth daily.   azelastine (ASTELIN) 0.1 % nasal spray Place 1 spray into both nostrils 2 (two) times daily as needed (allergies.).    ciprofloxacin-dexamethasone (CIPRODEX) OTIC suspension  (Patient not taking: Reported on 12/09/2020)   doxazosin (CARDURA) 8 MG tablet TAKE ONE TABLET BY MOUTH EVERY NIGHT AT BEDTIME   EPINEPHrine 0.3 mg/0.3 mL IJ SOAJ injection Inject 0.3 mg into the muscle as needed (allergic reaction).    esomeprazole (NEXIUM) 40 MG capsule TAKE ONE CAPSULE BY MOUTH EVERY EVENING BEFORE SUPPER   fluticasone (FLONASE) 50 MCG/ACT nasal spray Place 2 sprays into both nostrils daily.    losartan (COZAAR) 100 MG tablet TAKE ONE TABLET BY MOUTH DAILY   montelukast (SINGULAIR) 10 MG tablet TAKE ONE TABLET BY MOUTH EVERY NIGHT AT BEDTIME   mupirocin ointment (BACTROBAN) 2 %  (Patient not taking: Reported on 12/09/2020)   rosuvastatin (CRESTOR) 20 MG tablet Take 1 tablet (20 mg total) by mouth daily.   sulfamethoxazole-trimethoprim (BACTRIM DS) 800-160 MG tablet  (Patient not taking: Reported on 12/09/2020)   tobramycin-dexamethasone (TOBRADEX) ophthalmic solution Apply to eye. (Patient not taking: Reported on 12/09/2020)   zinc gluconate 50 MG tablet Take 50 mg by mouth daily.   No facility-administered encounter medications on file as of 01/22/2021.    Allergies (verified) Patient has no known allergies.  History: Past Medical History:  Diagnosis Date   Abnormal EKG    hx left anterior fasicular block on old ekg 2017   Allergic rhinitis    Allergy    Barrett's esophagus    Benign prostatic hypertrophy    Colitis, ischemic (HCC)    X 2   Complication of anesthesia    SLOW TO AWAKEN AFTER 1 SURGERY 20  YRS AGO   DJD (degenerative joint disease)    SPINE, AND OA   GERD (gastroesophageal reflux disease)    H/O  hiatal hernia    HOH (hard of hearing)    BOTH EARS   Hyperlipidemia    no medication per pt.   Hypertension    pt denies says cardura for bladder   Neuropathy     LEFT HAND AND ARM NUMB ALL THE TIME   Overweight(278.02)    Past Surgical History:  Procedure Laterality Date   2 neck surgeries  2005-2006   Dr. Georgia DomHirsh--ant cx decompression, diskectomy,fusion C6-7 allograftWITH PLATE   APPENDECTOMY     EYE SURGERY     bilateral cataract surgerywith lens implant   FOOT SURGERY     right x 3   LAPAROSCOPIC CHOLECYSTECTOMY  1993   Dr. Luan MooreP. Young   left total knee replacement  07/2007   Dr. Eulah Pontmurphy/    right total knee replacement  2012   Dr. Romie JumperAlusio   SEPTOPLASTY  1975, May 2013   x3   TONSILLECTOMY  as child   TOTAL HIP ARTHROPLASTY Left 04/18/2018   Procedure: LEFT TOTAL HIP ARTHROPLASTY ANTERIOR APPROACH;  Surgeon: Ollen GrossAluisio, Frank, MD;  Location: [facility];  Service: Orthopedics;  Laterality: Left;   TOTAL HIP ARTHROPLASTY Right 03/13/2019   Procedure: TOTAL HIP ARTHROPLASTY ANTERIOR APPROACH;  Surgeon: Ollen GrossAluisio, Frank, MD;  Location: [facility];  Service: Orthopedics;  Laterality: Right;  100min   TOTAL KNEE REVISION Left 11/11/2015   Procedure: LEFT KNEE POLYETHELENE REVISION;  Surgeon: Ollen GrossFrank Aluisio, MD;  Location: [facility];  Service: Orthopedics;  Laterality: Left;   TOTAL KNEE REVISION Left 12/07/2016   Procedure: LEFT TOTAL KNEE REVISION;  Surgeon: Ollen GrossAluisio, Frank, MD;  Location: [facility];  Service: Orthopedics;  Laterality: Left;  Adductor Block   Family History  Problem Relation Age of Onset   Leukemia Mother    Heart disease Father    Heart disease Sister    Colon cancer Neg Hx    Stomach cancer Neg Hx    Rectal cancer Neg Hx    Esophageal cancer Neg Hx    Social History   Socioeconomic History   Marital status: Married    Spouse name: Not on file   Number of children: 2   Years of education: some college   Highest education level: Not on file  Occupational History    Occupation: retired  Tobacco Use   Smoking status: Never   Smokeless tobacco: Never  Vaping Use   Vaping Use: Never used  Substance and Sexual Activity   Alcohol use: No    Alcohol/week: 0.0 standard drinks   Drug use: No   Sexual activity: Not on file  Other Topics Concern   Not on file  Social History Narrative   Married   HH 2   2 children; 3 granchildren; 2 great grandchildren   Social Determinants of Health   Financial Resource Strain: Not on file  Food Insecurity: Not on file  Transportation Needs: Not on file  Physical Activity: Not on file  Stress: Not  on file  Social Connections: Not on file    Tobacco Counseling Counseling given: Not Answered   Clinical Intake:                 Diabetic?no         Activities of Daily Living No flowsheet data found.  Patient Care Team: Kristian Covey, MD as PCP - General (Family Medicine)  Indicate any recent Medical Services you may have received from other than [facility] providers in the past year (date may be approximate).     Assessment:   This is a routine wellness examination for Rithy.  Hearing/Vision screen No results found.  Dietary issues and exercise activities discussed:     Goals Addressed   None    Depression Screen PHQ 2/9 Scores 06/11/2019 05/19/2019 02/07/2018 11/22/2017 11/16/2016 09/02/2015 08/18/2014  PHQ - 2 Score 0 1 0 0 0 0 0    Fall Risk Fall Risk  06/11/2019 05/19/2019 02/07/2018 11/22/2017 11/16/2016  Falls in the past year? 0 0 No No No  Number falls in past yr: - 0 - - -  Risk for fall due to : Medication side effect Orthopedic patient - - -  Follow up Falls prevention discussed;Education provided;Falls evaluation completed - - - -    FALL RISK PREVENTION PERTAINING TO THE HOME:  Any stairs in or around the home? {YES/NO:21197} If so, are there any without handrails? {YES/NO:21197} Home free of loose throw rugs in walkways, pet beds, electrical cords, etc?  {YES/NO:21197} Adequate lighting in your home to reduce risk of falls? {YES/NO:21197}  ASSISTIVE DEVICES UTILIZED TO PREVENT FALLS:  Life alert? {YES/NO:21197} Use of a cane, walker or w/c? {YES/NO:21197} Grab bars in the bathroom? {YES/NO:21197} Shower chair or bench in shower? {YES/NO:21197} Elevated toilet seat or a handicapped toilet? {YES/NO:21197}  TIMED UP AND GO:  Was the test performed? {YES/NO:21197}.  Length of time to ambulate 10 feet: *** sec.   {Appearance of Gait:2101803}  Cognitive Function: MMSE - Mini Mental State Exam 02/07/2018  Not completed: (No Data)     6CIT Screen 06/11/2019  What Year? 0 points  What month? 0 points  What time? 0 points  Count back from 20 0 points  Months in reverse 0 points  Repeat phrase 0 points  Total Score 0    Immunizations Immunization History  Administered Date(s) Administered   Fluad Quad(high Dose 65+) 03/18/2020   H1N1 07/03/2008   Influenza Split 03/04/2011, 02/07/2012   Influenza Whole 03/27/2007, 02/27/2008, 03/13/2009   Influenza, High Dose Seasonal PF 04/09/2015, 03/01/2016, 03/06/2017, 02/07/2018, 02/28/2019   Influenza,inj,Quad PF,6+ Mos 02/08/2013, 02/17/2014   Influenza-Unspecified 04/05/2017   Pneumococcal Conjugate-13 02/17/2014   Pneumococcal Polysaccharide-23 11/16/2016   Td 06/22/2009   Zoster Recombinat (Shingrix) 11/24/2017, 02/23/2018   Zoster, Live 02/23/2018    {TDAP status:2101805}  {Flu Vaccine status:2101806}  {Pneumococcal vaccine status:2101807}  {Covid-19 vaccine status:2101808}  Qualifies for Shingles Vaccine? {YES/NO:21197}  Zostavax completed {YES/NO:21197}  {Shingrix Completed?:2101804}  Screening Tests Health Maintenance  Topic Date Due   TETANUS/TDAP  06/23/2019   COVID-19 Vaccine (3 - Pfizer risk series) 09/09/2020   INFLUENZA VACCINE  12/28/2020   PNA vac Low Risk Adult  Completed   Zoster Vaccines- Shingrix  Completed   HPV VACCINES  Aged Out    Health  Maintenance  Health Maintenance Due  Topic Date Due   TETANUS/TDAP  06/23/2019   COVID-19 Vaccine (3 - Pfizer risk series) 09/09/2020   INFLUENZA VACCINE  12/28/2020    {  Colorectal cancer screening:2101809}  Lung Cancer Screening: (Low Dose CT Chest recommended if Age 44-80 years, 30 pack-year currently smoking OR have quit w/in 15years.) {DOES NOT does:27190::"does not"} qualify.   Lung Cancer Screening Referral: ***  Additional Screening:  Hepatitis C Screening: {DOES NOT does:27190::"does not"} qualify; Completed ***  Vision Screening: Recommended annual ophthalmology exams for early detection of glaucoma and other disorders of the eye. Is the patient up to date with their annual eye exam?  {YES/NO:21197} Who is the provider or what is the name of the office in which the patient attends annual eye exams? *** If pt is not established with a provider, would they like to be referred to a provider to establish care? {YES/NO:21197}.   Dental Screening: Recommended annual dental exams for proper oral hygiene  Community Resource Referral / Chronic Care Management: CRR required this visit?  {YES/NO:21197}  CCM required this visit?  {YES/NO:21197}     Plan:     I have personally reviewed and noted the following in the patient's chart:   Medical and social history Use of alcohol, tobacco or illicit drugs  Current medications and supplements including opioid prescriptions. {Opioid [phone]} Functional ability and status Nutritional status Physical activity Advanced directives List of other physicians Hospitalizations, surgeries, and ER visits in previous 12 months Vitals Screenings to include cognitive, depression, and falls Referrals and appointments  In addition, I have reviewed and discussed with patient certain preventive protocols, quality metrics, and best practice recommendations. A written personalized care plan for preventive services as well as  general preventive health recommendations were provided to patient.     March Rummage, LPN   1/61/0960   Nurse Notes: ***

## 2021-01-25 DIAGNOSIS — D0461 Carcinoma in situ of skin of right upper limb, including shoulder: Secondary | ICD-10-CM | POA: Diagnosis not present

## 2021-01-26 ENCOUNTER — Telehealth: Payer: Self-pay | Admitting: Family Medicine

## 2021-01-26 DIAGNOSIS — I10 Essential (primary) hypertension: Secondary | ICD-10-CM | POA: Diagnosis not present

## 2021-01-26 DIAGNOSIS — H524 Presbyopia: Secondary | ICD-10-CM | POA: Diagnosis not present

## 2021-01-26 DIAGNOSIS — E7800 Pure hypercholesterolemia, unspecified: Secondary | ICD-10-CM | POA: Diagnosis not present

## 2021-01-26 DIAGNOSIS — T7840XS Allergy, unspecified, sequela: Secondary | ICD-10-CM | POA: Diagnosis not present

## 2021-01-26 NOTE — Telephone Encounter (Signed)
Tried calling patient to schedule Medicare Annual Wellness Visit (AWV) either virtually or in office. Left  my Isaiah Wilson number [phone]  No answer    Last AWV 06/10/20  ; please schedule at anytime with [facility] Nurse Health Advisor 1 or 2   This should be a 45 minute visit.

## 2021-02-03 ENCOUNTER — Other Ambulatory Visit: Payer: Self-pay

## 2021-02-03 ENCOUNTER — Encounter: Payer: Self-pay | Admitting: Podiatry

## 2021-02-03 ENCOUNTER — Ambulatory Visit: Payer: Medicare PPO | Admitting: Podiatry

## 2021-02-03 DIAGNOSIS — B351 Tinea unguium: Secondary | ICD-10-CM

## 2021-02-03 DIAGNOSIS — M79675 Pain in left toe(s): Secondary | ICD-10-CM | POA: Diagnosis not present

## 2021-02-03 DIAGNOSIS — D689 Coagulation defect, unspecified: Secondary | ICD-10-CM

## 2021-02-03 DIAGNOSIS — M79674 Pain in right toe(s): Secondary | ICD-10-CM | POA: Diagnosis not present

## 2021-02-03 NOTE — Progress Notes (Signed)
Subjective:   Patient ID: Isaiah Wilson, male   DOB: 86 y.o.   MRN: [id number]   HPI Patient presents stating his nails are thick and ingrown and they bother him   ROS      Objective:  Physical Exam  Neurovascular status intact thick yellow brittle nailbeds 1-5 both feet thick and incurvated in the corners     Assessment:  Mycotic nail infection with pain 1-5 both feet     Plan:  Debridement nailbeds 1-5 both feet no iatrogenic bleeding noted

## 2021-02-09 ENCOUNTER — Ambulatory Visit: Payer: Medicare PPO | Admitting: Family Medicine

## 2021-02-09 ENCOUNTER — Other Ambulatory Visit: Payer: Self-pay

## 2021-02-09 ENCOUNTER — Encounter: Payer: Self-pay | Admitting: Family Medicine

## 2021-02-09 VITALS — BP 130/60 | HR 52 | Temp 97.8°F | Wt 244.1 lb

## 2021-02-09 DIAGNOSIS — Z23 Encounter for immunization: Secondary | ICD-10-CM | POA: Diagnosis not present

## 2021-02-09 DIAGNOSIS — I10 Essential (primary) hypertension: Secondary | ICD-10-CM | POA: Diagnosis not present

## 2021-02-09 DIAGNOSIS — M170 Bilateral primary osteoarthritis of knee: Secondary | ICD-10-CM

## 2021-02-09 DIAGNOSIS — K219 Gastro-esophageal reflux disease without esophagitis: Secondary | ICD-10-CM | POA: Diagnosis not present

## 2021-02-09 NOTE — Progress Notes (Signed)
Established Patient Office Visit  Subjective:  Patient ID: Isaiah Wilson, male    DOB: 1935/03/15  Age: 86 y.o. MRN: [id number]  CC:  Chief Complaint  Patient presents with   Form Completion    [facility]    HPI AWS SHERE is here requesting form completion's for he and his wife from the [place] basically in attempt to get regular aid and attendance at home.  Both he and his wife have significant health problems and needing assistance to maintain independence.    His problems include history of hypertension, GERD, Barrett's esophagus, osteoarthritis, neuropathy, chronic bilateral sensorineural hearing loss, hyperlipidemia  He does have some problems with decreased range of motion and is able to ambulate with a cane but has ongoing balance issues.  He does need assistance with things like bathing and tending to other hygiene needs.  Needs a grab bar and elevated seat in the bathroom to reduce fall risk.  He also needs help with meal preparation.  Has had multiple prior knee surgeries and has balance problems related to ear that make fall risk high.    Past Medical History:  Diagnosis Date   Abnormal EKG    hx left anterior fasicular block on old ekg 2017   Allergic rhinitis    Allergy    Barrett's esophagus    Benign prostatic hypertrophy    Colitis, ischemic (HCC)    X 2   Complication of anesthesia    SLOW TO AWAKEN AFTER 1 SURGERY 20  YRS AGO   DJD (degenerative joint disease)    SPINE, AND OA   GERD (gastroesophageal reflux disease)    H/O hiatal hernia    HOH (hard of hearing)    BOTH EARS   Hyperlipidemia    no medication per pt.   Hypertension    pt denies says cardura for bladder   Neuropathy     LEFT HAND AND ARM NUMB ALL THE TIME   Overweight(278.02)     Past Surgical History:  Procedure Laterality Date   2 neck surgeries  2005-2006   Dr. Helane Gunther cx decompression, diskectomy,fusion C6-7 allograftWITH PLATE   APPENDECTOMY     EYE SURGERY      bilateral cataract surgerywith lens implant   FOOT SURGERY     right x 3   LAPAROSCOPIC CHOLECYSTECTOMY  1993   Dr. Charlynne Pander   left total knee replacement  07/2007   Dr. Percell Miller    right total knee replacement  2012   Dr. Thelma Comp  1975, May 2013   x3   TONSILLECTOMY  as child   TOTAL HIP ARTHROPLASTY Left 04/18/2018   Procedure: LEFT TOTAL HIP ARTHROPLASTY ANTERIOR APPROACH;  Surgeon: Gaynelle Arabian, MD;  Location: [facility];  Service: Orthopedics;  Laterality: Left;   TOTAL HIP ARTHROPLASTY Right 03/13/2019   Procedure: TOTAL HIP ARTHROPLASTY ANTERIOR APPROACH;  Surgeon: Gaynelle Arabian, MD;  Location: [facility];  Service: Orthopedics;  Laterality: Right;  126mn   TOTAL KNEE REVISION Left 11/11/2015   Procedure: LEFT KNEE POLYETHELENE REVISION;  Surgeon: FGaynelle Arabian MD;  [facility];  Service: Orthopedics;  Laterality: Left;   TOTAL KNEE REVISION Left 12/07/2016   Procedure: LEFT TOTAL KNEE REVISION;  Surgeon: AGaynelle Arabian MD;  Location: [facility];  Service: Orthopedics;  Laterality: Left;  Adductor Block    Family History  Problem Relation Age of Onset   Leukemia Mother    Heart disease Father  Heart disease Sister    Colon cancer Neg Hx    Stomach cancer Neg Hx    Rectal cancer Neg Hx    Esophageal cancer Neg Hx     Social History   Socioeconomic History   Marital status: Married    Spouse name: Not on file   Number of children: 2   Years of education: some college   Highest education level: Not on file  Occupational History   Occupation: retired  Tobacco Use   Smoking status: Never   Smokeless tobacco: Never  Vaping Use   Vaping Use: Never used  Substance and Sexual Activity   Alcohol use: No    Alcohol/week: 0.0 standard drinks   Drug use: No   Sexual activity: Not on file  Other Topics Concern   Not on file  Social History Narrative   Married   [place] 2   2 children; 3 granchildren; 2 great grandchildren   Social Determinants of Health    Financial Resource Strain: Not on file  Food Insecurity: Not on file  Transportation Needs: Not on file  Physical Activity: Not on file  Stress: Not on file  Social Connections: Not on file  Intimate Partner Violence: Not on file    Outpatient Medications Prior to Visit  Medication Sig Dispense Refill   acetaminophen (TYLENOL) 650 MG CR tablet Take 650 mg by mouth every 8 (eight) hours as needed for pain.     amLODipine (NORVASC) 5 MG tablet TAKE ONE TABLET BY MOUTH DAILY 90 tablet 3   aspirin EC 81 MG tablet Take 81 mg by mouth daily.     azelastine (ASTELIN) 0.1 % nasal spray Place 1 spray into both nostrils 2 (two) times daily as needed (allergies.).      ciprofloxacin-dexamethasone (CIPRODEX) OTIC suspension      doxazosin (CARDURA) 8 MG tablet TAKE ONE TABLET BY MOUTH EVERY NIGHT AT BEDTIME 60 tablet 1   EPINEPHrine 0.3 mg/0.3 mL IJ SOAJ injection Inject 0.3 mg into the muscle as needed (allergic reaction).      esomeprazole (NEXIUM) 40 MG capsule TAKE ONE CAPSULE BY MOUTH EVERY EVENING BEFORE SUPPER 90 capsule 3   fluticasone (FLONASE) 50 MCG/ACT nasal spray Place 2 sprays into both nostrils daily.      losartan (COZAAR) 100 MG tablet TAKE ONE TABLET BY MOUTH DAILY 180 tablet 1   montelukast (SINGULAIR) 10 MG tablet TAKE ONE TABLET BY MOUTH EVERY NIGHT AT BEDTIME 90 tablet 0   mupirocin ointment (BACTROBAN) 2 %      rosuvastatin (CRESTOR) 20 MG tablet Take 1 tablet (20 mg total) by mouth daily. 90 tablet 3   sulfamethoxazole-trimethoprim (BACTRIM DS) 800-160 MG tablet      tobramycin-dexamethasone (TOBRADEX) ophthalmic solution Apply to eye.     zinc gluconate 50 MG tablet Take 50 mg by mouth daily.     No facility-administered medications prior to visit.    No Known Allergies  ROS Review of Systems  Constitutional:  Negative for fatigue and unexpected weight change.  Eyes:  Negative for visual disturbance.  Respiratory:  Negative for cough, chest tightness and  shortness of breath.   Cardiovascular:  Negative for chest pain, palpitations and leg swelling.  Musculoskeletal:  Positive for arthralgias.  Neurological:  Negative for dizziness, syncope, weakness, light-headedness and headaches.  Psychiatric/Behavioral:  Negative for confusion.      Objective:    Physical Exam Vitals reviewed.  Constitutional:      Appearance: Normal appearance.  HENT:     Head: Normocephalic and atraumatic.  Cardiovascular:     Rate and Rhythm: Normal rate and regular rhythm.  Pulmonary:     Effort: Pulmonary effort is normal.     Breath sounds: Normal breath sounds.  Musculoskeletal:     Right lower leg: No edema.     Left lower leg: No edema.  Neurological:     Mental Status: He is alert.    BP 130/60 (BP Location: Left Arm, Patient Position: Sitting, Cuff Size: Normal)   Pulse (!) 52   Temp 97.8 F (36.6 C) (Oral)   Wt 244 lb 1.6 oz (110.7 kg)   SpO2 98%   BMI 34.05 kg/m  Wt Readings from Last 3 Encounters:  02/09/21 244 lb 1.6 oz (110.7 kg)  12/09/20 239 lb (108.4 kg)  11/04/20 239 lb 6.4 oz (108.6 kg)     Health Maintenance Due  Topic Date Due   TETANUS/TDAP  06/23/2019   COVID-19 Vaccine (3 - Pfizer risk series) 09/09/2020    There are no preventive care reminders to display for this patient.  Lab Results  Component Value Date   TSH 2.69 11/04/2020   Lab Results  Component Value Date   WBC 9.0 11/04/2020   HGB 13.2 11/04/2020   HCT 39.1 11/04/2020   MCV 86.5 11/04/2020   PLT 180.0 11/04/2020   Lab Results  Component Value Date   NA 140 11/04/2020   K 3.8 11/04/2020   CO2 25 11/04/2020   GLUCOSE 118 (H) 11/04/2020   BUN 15 11/04/2020   CREATININE 1.04 11/04/2020   BILITOT 0.7 08/18/2019   ALKPHOS 59 08/18/2019   AST 23 08/18/2019   ALT 18 08/18/2019   PROT 7.4 08/18/2019   ALBUMIN 4.2 08/18/2019   CALCIUM 8.8 11/04/2020   ANIONGAP 9 08/18/2019   EGFR 64 10/07/2020   GFR 66.37 11/04/2020   Lab Results   Component Value Date   CHOL 169 11/22/2017   Lab Results  Component Value Date   HDL 40.10 11/22/2017   Lab Results  Component Value Date   LDLCALC 96 11/22/2017   Lab Results  Component Value Date   TRIG 164.0 (H) 11/22/2017   Lab Results  Component Value Date   CHOLHDL 4 11/22/2017   Lab Results  Component Value Date   HGBA1C 5.7 (A) 02/07/2018      Assessment & Plan:   #1   hx of severe osteoarthritis impairing multiple joints.   Ambulates with cane.   Increased risk of falls -forms completed in attempt to get patient and wife some assistance  #2 hypertension- stable on Amlodipine and Losartan Continue current medications  #3 hyperlipidemia- pt on Crestor.   Lipid and hepatic have been ordered but pt did not apparently have drawn  today.    Needs with next visit  #4 health maintenance- flu vaccine given  Over 30 minutes spent discussing home needs and filling out required forms from the [place] for in-home assistance.     No orders of the defined types were placed in this encounter.   Follow-up: No follow-ups on file.    Carolann Littler, MD

## 2021-02-10 DIAGNOSIS — E785 Hyperlipidemia, unspecified: Secondary | ICD-10-CM | POA: Diagnosis not present

## 2021-02-11 LAB — LIPID PANEL
Chol/HDL Ratio: 3 ratio (ref 0.0–5.0)
Cholesterol, Total: 118 mg/dL (ref 100–199)
HDL: 39 mg/dL — ABNORMAL LOW (ref >39)
LDL Chol Calc (NIH): 52 mg/dL (ref 0–99)
Triglycerides: 162 mg/dL — ABNORMAL HIGH (ref 0–149)
VLDL Cholesterol Cal: 27 mg/dL (ref 5–40)

## 2021-02-11 LAB — HEPATIC FUNCTION PANEL
ALT: 20 IU/L (ref 0–44)
AST: 24 IU/L (ref 0–40)
Albumin: 4.3 g/dL (ref 3.6–4.6)
Alkaline Phosphatase: 61 IU/L (ref 44–121)
Bilirubin Total: 0.4 mg/dL (ref 0.0–1.2)
Bilirubin, Direct: 0.14 mg/dL (ref 0.00–0.40)
Total Protein: 6.5 g/dL (ref 6.0–8.5)

## 2021-02-12 ENCOUNTER — Telehealth: Payer: Self-pay | Admitting: *Deleted

## 2021-02-12 DIAGNOSIS — Z9889 Other specified postprocedural states: Secondary | ICD-10-CM | POA: Diagnosis not present

## 2021-02-12 DIAGNOSIS — H90A22 Sensorineural hearing loss, unilateral, left ear, with restricted hearing on the contralateral side: Secondary | ICD-10-CM | POA: Diagnosis not present

## 2021-02-12 DIAGNOSIS — H90A31 Mixed conductive and sensorineural hearing loss, unilateral, right ear with restricted hearing on the contralateral side: Secondary | ICD-10-CM | POA: Diagnosis not present

## 2021-02-12 DIAGNOSIS — Z9622 Myringotomy tube(s) status: Secondary | ICD-10-CM | POA: Diagnosis not present

## 2021-02-12 DIAGNOSIS — Z9089 Acquired absence of other organs: Secondary | ICD-10-CM | POA: Diagnosis not present

## 2021-02-12 DIAGNOSIS — H65491 Other chronic nonsuppurative otitis media, right ear: Secondary | ICD-10-CM | POA: Diagnosis not present

## 2021-02-12 DIAGNOSIS — Z4589 Encounter for adjustment and management of other implanted devices: Secondary | ICD-10-CM | POA: Diagnosis not present

## 2021-02-12 NOTE — Telephone Encounter (Signed)
Called the patient to speak to him about his lab results.  He stated that he went to his PCP and ENT recently and that at both visits his heart rate was in the low 40's. They both suggested that he be seen by his cardiologist.   PCP is in epic. Blood pressure and heart rate were 130/60  and 52. ENT is in Care Everywhere. Blood pressure was 125/56  and heart rate was 50  He stated that is it normally in the 50's. He has been dizzy but stated that this has been on going which is why he saw his ENT.  He currently takes: Amlodipine 5 mg  Cardura 8 mg once daily Losartan 100 mg once daily  He has been advised to keep a log of his heart rates and check it daily 2-3 times, especially if he becomes symptomatic.

## 2021-02-15 ENCOUNTER — Other Ambulatory Visit: Payer: Self-pay

## 2021-02-15 ENCOUNTER — Ambulatory Visit: Payer: Medicare PPO | Admitting: Family Medicine

## 2021-02-15 VITALS — BP 160/80 | HR 40

## 2021-02-15 DIAGNOSIS — R001 Bradycardia, unspecified: Secondary | ICD-10-CM

## 2021-02-15 DIAGNOSIS — R42 Dizziness and giddiness: Secondary | ICD-10-CM | POA: Diagnosis not present

## 2021-02-15 NOTE — Patient Instructions (Addendum)
I will set up carotid doppler to look at circulation in your neck  We might need to consider neurology assessment if dizziness persists.

## 2021-02-15 NOTE — Telephone Encounter (Signed)
Attempted to reach the patient. There was no voicemail and the line was disconnected.  [name], MD  You 2 days ago   Follow HR and BP; schedule elective ov  Olga Millers

## 2021-02-15 NOTE — Progress Notes (Signed)
Established Patient Office Visit  Subjective:  Patient ID: Isaiah Wilson, male    DOB: 15-May-1935  Age: 86 y.o. MRN: [id number]  CC:  Chief Complaint  Patient presents with   Bradycardia    HPI Isaiah Wilson walked into our clinic today with complaints of dizziness.  I was told by nursing staff that he had pulse around 35-40.  We did not have any openings in our schedule but were able to work him in.  He does have history of chronic right mastoiditis and chronic right suppurative otitis changes and is followed by ENT.  Has had previous right ear surgery.  He has had some dizziness intermittently for quite some time and he states symptoms today were very similar in quality to previous dizziness but perhaps more severe.  He does not describe any syncopal or presyncopal symptoms but this sounds more sense of instability and is worse when he looks upward or moves his head.  He does not have any dizziness sitting completely still at rest.  He is not describing any orthostatic type symptoms Denies any speech change, visual changes, focal weakness, or confusion.  Ambulates with a cane  He does have history of some intermittent bradycardia.  He brings in vitals from ENT visit last week and had heart rate of 42 with blood pressure 125/56 seated  He apparently had called cardiology earlier today and is trying to get into see his cardiologist.  Denies any recent chest pains.  Does not take any beta-blockers.  His current medications are amlodipine, aspirin, doxazosin, losartan  Past Medical History:  Diagnosis Date   Abnormal EKG    hx left anterior fasicular block on old ekg 2017   Allergic rhinitis    Allergy    Barrett's esophagus    Benign prostatic hypertrophy    Colitis, ischemic (HCC)    X 2   Complication of anesthesia    SLOW TO AWAKEN AFTER 1 SURGERY 20  YRS AGO   DJD (degenerative joint disease)    SPINE, AND OA   GERD (gastroesophageal reflux disease)    H/O hiatal hernia     HOH (hard of hearing)    BOTH EARS   Hyperlipidemia    no medication per pt.   Hypertension    pt denies says cardura for bladder   Neuropathy     LEFT HAND AND ARM NUMB ALL THE TIME   Overweight(278.02)     Past Surgical History:  Procedure Laterality Date   2 neck surgeries  2005-2006   Dr. Helane Gunther cx decompression, diskectomy,fusion C6-7 allograftWITH PLATE   APPENDECTOMY     EYE SURGERY     bilateral cataract surgerywith lens implant   FOOT SURGERY     right x 3   LAPAROSCOPIC CHOLECYSTECTOMY  1993   Dr. Charlynne Pander   left total knee replacement  07/2007   Dr. Percell Miller    right total knee replacement  2012   Dr. Thelma Comp  1975, May 2013   x3   TONSILLECTOMY  as child   TOTAL HIP ARTHROPLASTY Left 04/18/2018   Procedure: LEFT TOTAL HIP ARTHROPLASTY ANTERIOR APPROACH;  Surgeon: Gaynelle Arabian, MD;  Location: [facility];  Service: Orthopedics;  Laterality: Left;   TOTAL HIP ARTHROPLASTY Right 03/13/2019   Procedure: TOTAL HIP ARTHROPLASTY ANTERIOR APPROACH;  Surgeon: Gaynelle Arabian, MD;  Location: [facility];  Service: Orthopedics;  Laterality: Right;  174mn   TOTAL KNEE REVISION Left 11/11/2015   Procedure:  LEFT KNEE POLYETHELENE REVISION;  Surgeon: Gaynelle Arabian, MD;  Location: [facility];  Service: Orthopedics;  Laterality: Left;   TOTAL KNEE REVISION Left 12/07/2016   Procedure: LEFT TOTAL KNEE REVISION;  Surgeon: Gaynelle Arabian, MD;  Location: [facility];  Service: Orthopedics;  Laterality: Left;  Adductor Block    Family History  Problem Relation Age of Onset   Leukemia Mother    Heart disease Father    Heart disease Sister    Colon cancer Neg Hx    Stomach cancer Neg Hx    Rectal cancer Neg Hx    Esophageal cancer Neg Hx     Social History   Socioeconomic History   Marital status: Married    Spouse name: Not on file   Number of children: 2   Years of education: some college   Highest education level: Not on file  Occupational History   Occupation:  retired  Tobacco Use   Smoking status: Never   Smokeless tobacco: Never  Vaping Use   Vaping Use: Never used  Substance and Sexual Activity   Alcohol use: No    Alcohol/week: 0.0 standard drinks   Drug use: No   Sexual activity: Not on file  Other Topics Concern   Not on file  Social History Narrative   Married   [place] 2   2 children; 3 granchildren; 2 great grandchildren   Social Determinants of Health   Financial Resource Strain: Not on file  Food Insecurity: Not on file  Transportation Needs: Not on file  Physical Activity: Not on file  Stress: Not on file  Social Connections: Not on file  Intimate Partner Violence: Not on file    Outpatient Medications Prior to Visit  Medication Sig Dispense Refill   acetaminophen (TYLENOL) 650 MG CR tablet Take 650 mg by mouth every 8 (eight) hours as needed for pain.     amLODipine (NORVASC) 5 MG tablet TAKE ONE TABLET BY MOUTH DAILY 90 tablet 3   aspirin EC 81 MG tablet Take 81 mg by mouth daily.     azelastine (ASTELIN) 0.1 % nasal spray Place 1 spray into both nostrils 2 (two) times daily as needed (allergies.).      ciprofloxacin-dexamethasone (CIPRODEX) OTIC suspension      doxazosin (CARDURA) 8 MG tablet TAKE ONE TABLET BY MOUTH EVERY NIGHT AT BEDTIME 60 tablet 1   EPINEPHrine 0.3 mg/0.3 mL IJ SOAJ injection Inject 0.3 mg into the muscle as needed (allergic reaction).      esomeprazole (NEXIUM) 40 MG capsule TAKE ONE CAPSULE BY MOUTH EVERY EVENING BEFORE SUPPER 90 capsule 3   fluticasone (FLONASE) 50 MCG/ACT nasal spray Place 2 sprays into both nostrils daily.      losartan (COZAAR) 100 MG tablet TAKE ONE TABLET BY MOUTH DAILY 180 tablet 1   montelukast (SINGULAIR) 10 MG tablet TAKE ONE TABLET BY MOUTH EVERY NIGHT AT BEDTIME 90 tablet 0   mupirocin ointment (BACTROBAN) 2 %      rosuvastatin (CRESTOR) 20 MG tablet Take 1 tablet (20 mg total) by mouth daily. 90 tablet 3   sulfamethoxazole-trimethoprim (BACTRIM DS) 800-160 MG tablet       tobramycin-dexamethasone (TOBRADEX) ophthalmic solution Apply to eye.     zinc gluconate 50 MG tablet Take 50 mg by mouth daily.     No facility-administered medications prior to visit.    No Known Allergies  ROS Review of Systems  Constitutional:  Negative for chills and fever.  HENT:  Negative for  congestion.   Respiratory:  Negative for cough and shortness of breath.   Cardiovascular:  Negative for chest pain.  Neurological:  Positive for dizziness. Negative for seizures, syncope, facial asymmetry, speech difficulty, weakness and headaches.     Objective:    Physical Exam Vitals reviewed.  Constitutional:      Appearance: Normal appearance.  Neck:     Comments: No carotid bruits noted  Cardiovascular:     Comments: Regular rhythm.  Palpated and auscultated rate of 50-52 Pulmonary:     Effort: Pulmonary effort is normal.     Breath sounds: Normal breath sounds.  Musculoskeletal:     Cervical back: Neck supple.     Right lower leg: No edema.     Left lower leg: No edema.  Neurological:     Mental Status: He is alert.     Comments: Full strength upper and lower extremities.  Cranial nerves II through XII intact. He did seem a little off balance when he first went from seated to standing position but after standing for a few seconds seem to gain his balance and was ambulating with a cane without difficulty.    BP (!) 160/80 (BP Location: Left Arm, Patient Position: Standing, Cuff Size: Normal)   Pulse (!) 40  Wt Readings from Last 3 Encounters:  02/09/21 244 lb 1.6 oz (110.7 kg)  12/09/20 239 lb (108.4 kg)  11/04/20 239 lb 6.4 oz (108.6 kg)     Health Maintenance Due  Topic Date Due   TETANUS/TDAP  06/23/2019   COVID-19 Vaccine (3 - Pfizer risk series) 09/09/2020    There are no preventive care reminders to display for this patient.  Lab Results  Component Value Date   TSH 2.69 11/04/2020   Lab Results  Component Value Date   WBC 9.0 11/04/2020    HGB 13.2 11/04/2020   HCT 39.1 11/04/2020   MCV 86.5 11/04/2020   PLT 180.0 11/04/2020   Lab Results  Component Value Date   NA 140 11/04/2020   K 3.8 11/04/2020   CO2 25 11/04/2020   GLUCOSE 118 (H) 11/04/2020   BUN 15 11/04/2020   CREATININE 1.04 11/04/2020   BILITOT 0.4 02/10/2021   ALKPHOS 61 02/10/2021   AST 24 02/10/2021   ALT 20 02/10/2021   PROT 6.5 02/10/2021   ALBUMIN 4.3 02/10/2021   CALCIUM 8.8 11/04/2020   ANIONGAP 9 08/18/2019   EGFR 64 10/07/2020   GFR 66.37 11/04/2020   Lab Results  Component Value Date   CHOL 118 02/10/2021   Lab Results  Component Value Date   HDL 39 (L) 02/10/2021   Lab Results  Component Value Date   LDLCALC 52 02/10/2021   Lab Results  Component Value Date   TRIG 162 (H) 02/10/2021   Lab Results  Component Value Date   CHOLHDL 3.0 02/10/2021   Lab Results  Component Value Date   HGBA1C 5.7 (A) 02/07/2018      Assessment & Plan:   Dizziness.  Initial concern was whether he was having symptomatic bradycardia.  We obtained vital signs of blood pressure seated left arm 152/70 with pulse around 50 and then standing 162/70 with pulse of 52  EKG shows sinus bradycardia with rate around 50 with incomplete right bundle branch block.  No acute changes.  Even though he has some bradycardia doubt this is related to his dizziness.  What he is describing sounds to me more vestibular and seems to be clearly more positional related  in terms of head movement.  -Do recommend close follow-up with cardiology.  Might be reasonable to get cardiac event monitoring to make sure he is not dipping lower with his heart rate but will defer to cardiology  -Set up carotid Dopplers  -Consider neurology referral if symptoms persist   No orders of the defined types were placed in this encounter.   Follow-up: No follow-ups on file.    Carolann Littler, MD

## 2021-02-16 DIAGNOSIS — J301 Allergic rhinitis due to pollen: Secondary | ICD-10-CM | POA: Diagnosis not present

## 2021-02-16 NOTE — Telephone Encounter (Signed)
Left a message for the patient to call back.  

## 2021-02-17 NOTE — Telephone Encounter (Signed)
Attempted to reach the patient. The line kept ringing. Was unable to leave a message.   Per PCP visit on 9/19:  Assessment & Plan:    Dizziness.  Initial concern was whether he was having symptomatic bradycardia.  We obtained vital signs of blood pressure seated left arm 152/70 with pulse around 50 and then standing 162/70 with pulse of 52   EKG shows sinus bradycardia with rate around 50 with incomplete right bundle branch block.  No acute changes.   Even though he has some bradycardia doubt this is related to his dizziness.  What he is describing sounds to me more vestibular and seems to be clearly more positional related in terms of head movement.   -Do recommend close follow-up with cardiology.  Might be reasonable to get cardiac event monitoring to make sure he is not dipping lower with his heart rate but will defer to cardiology

## 2021-02-22 NOTE — Telephone Encounter (Signed)
Spoke with the patient. He stated that he was feeling better. He feels like the dizziness could be from stress because he now feels better.

## 2021-02-23 NOTE — Telephone Encounter (Signed)
Patient stated that he will call back if he feels like he needs to be seen sooner.

## 2021-02-24 ENCOUNTER — Other Ambulatory Visit: Payer: Self-pay | Admitting: Family Medicine

## 2021-02-24 DIAGNOSIS — J301 Allergic rhinitis due to pollen: Secondary | ICD-10-CM | POA: Diagnosis not present

## 2021-03-02 DIAGNOSIS — J301 Allergic rhinitis due to pollen: Secondary | ICD-10-CM | POA: Diagnosis not present

## 2021-03-03 ENCOUNTER — Other Ambulatory Visit: Payer: Self-pay | Admitting: Family Medicine

## 2021-03-15 ENCOUNTER — Other Ambulatory Visit: Payer: Self-pay

## 2021-03-15 ENCOUNTER — Ambulatory Visit (HOSPITAL_BASED_OUTPATIENT_CLINIC_OR_DEPARTMENT_OTHER)
Admission: RE | Admit: 2021-03-15 | Discharge: 2021-03-15 | Disposition: A | Discharge status: Home / Self Care | Payer: Medicare PPO | Source: Ambulatory Visit | Attending: Family Medicine | Admitting: Family Medicine

## 2021-03-15 DIAGNOSIS — I6521 Occlusion and stenosis of right carotid artery: Secondary | ICD-10-CM | POA: Diagnosis not present

## 2021-03-15 DIAGNOSIS — R42 Dizziness and giddiness: Secondary | ICD-10-CM | POA: Insufficient documentation

## 2021-03-17 ENCOUNTER — Telehealth: Payer: Self-pay

## 2021-03-17 DIAGNOSIS — R21 Rash and other nonspecific skin eruption: Secondary | ICD-10-CM

## 2021-03-17 NOTE — Telephone Encounter (Signed)
Spoke with the patient would like a referral to dermatology for an ongoing rash. He stated they had spoken about this and Dr. Caryl Never had told him of a really great dermatologist he was going to send him to.

## 2021-03-17 NOTE — Telephone Encounter (Signed)
Referral has been placed. Patient is aware.  

## 2021-03-18 DIAGNOSIS — J301 Allergic rhinitis due to pollen: Secondary | ICD-10-CM | POA: Diagnosis not present

## 2021-03-28 ENCOUNTER — Other Ambulatory Visit: Payer: Self-pay | Admitting: Family Medicine

## 2021-04-02 DIAGNOSIS — Z9089 Acquired absence of other organs: Secondary | ICD-10-CM | POA: Diagnosis not present

## 2021-04-02 DIAGNOSIS — H90A31 Mixed conductive and sensorineural hearing loss, unilateral, right ear with restricted hearing on the contralateral side: Secondary | ICD-10-CM | POA: Diagnosis not present

## 2021-04-02 DIAGNOSIS — Z9622 Myringotomy tube(s) status: Secondary | ICD-10-CM | POA: Diagnosis not present

## 2021-04-02 DIAGNOSIS — H90A22 Sensorineural hearing loss, unilateral, left ear, with restricted hearing on the contralateral side: Secondary | ICD-10-CM | POA: Diagnosis not present

## 2021-04-02 DIAGNOSIS — H7311 Chronic myringitis, right ear: Secondary | ICD-10-CM | POA: Diagnosis not present

## 2021-04-02 DIAGNOSIS — Z4589 Encounter for adjustment and management of other implanted devices: Secondary | ICD-10-CM | POA: Diagnosis not present

## 2021-04-07 ENCOUNTER — Ambulatory Visit: Payer: Medicare PPO | Admitting: Podiatry

## 2021-04-07 ENCOUNTER — Encounter: Payer: Self-pay | Admitting: Podiatry

## 2021-04-07 ENCOUNTER — Other Ambulatory Visit: Payer: Self-pay

## 2021-04-07 DIAGNOSIS — D689 Coagulation defect, unspecified: Secondary | ICD-10-CM

## 2021-04-07 DIAGNOSIS — L84 Corns and callosities: Secondary | ICD-10-CM | POA: Diagnosis not present

## 2021-04-07 DIAGNOSIS — B351 Tinea unguium: Secondary | ICD-10-CM | POA: Diagnosis not present

## 2021-04-07 DIAGNOSIS — M79674 Pain in right toe(s): Secondary | ICD-10-CM | POA: Diagnosis not present

## 2021-04-07 DIAGNOSIS — M79675 Pain in left toe(s): Secondary | ICD-10-CM

## 2021-04-07 DIAGNOSIS — J301 Allergic rhinitis due to pollen: Secondary | ICD-10-CM | POA: Diagnosis not present

## 2021-04-07 NOTE — Progress Notes (Signed)
Subjective:   Patient ID: Isaiah Wilson, male   DOB: 86 y.o.   MRN: [id number]   HPI Patient presents with pain underneath the fifth metatarsal head of both feet that become painful with nail disease 1-5 both feet that are thick and he cannot cut.  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

## 2021-04-26 ENCOUNTER — Telehealth: Payer: Self-pay | Admitting: Family Medicine

## 2021-04-26 DIAGNOSIS — J301 Allergic rhinitis due to pollen: Secondary | ICD-10-CM | POA: Diagnosis not present

## 2021-04-26 NOTE — Telephone Encounter (Signed)
Left message for patient to call back and schedule Medicare Annual Wellness Visit (AWV) either virtually or in office. Left  my jabber number [phone]   Last AWV 03/31/20  please schedule at anytime with [facility] Nurse Health Advisor 1 or 2   This should be a 45 minute visit.  

## 2021-05-03 ENCOUNTER — Ambulatory Visit (INDEPENDENT_AMBULATORY_CARE_PROVIDER_SITE_OTHER): Payer: Medicare PPO

## 2021-05-03 VITALS — Ht 71.5 in | Wt 235.0 lb

## 2021-05-03 DIAGNOSIS — Z0000 Encounter for general adult medical examination without abnormal findings: Secondary | ICD-10-CM

## 2021-05-03 NOTE — Patient Instructions (Signed)
Isaiah Wilson , Thank you for taking time to come for your Medicare Wellness Visit. I appreciate your ongoing commitment to your health goals. Please review the following plan we discussed and let me know if I can assist you in the future.   Screening recommendations/referrals: Colonoscopy: not required Recommended yearly ophthalmology/optometry visit for glaucoma screening and checkup Recommended yearly dental visit for hygiene and checkup  Vaccinations: Influenza vaccine: completed 02/09/2021 Pneumococcal vaccine: completed 11/16/2016 Tdap vaccine: due Shingles vaccine: complete   Covid-19:  06/20/2019, 07/11/2019, 03/21/2020, 12/07/2020  Advanced directives: Please bring a copy of your POA (Power of Attorney) and/or Living Will to your next appointment.   Conditions/risks identified: none  Next appointment: Follow up in one year for your annual wellness visit.   Preventive Care 84 Years and Older, Male Preventive care refers to lifestyle choices and visits with your health care provider that can promote health and wellness. What does preventive care include? A yearly physical exam. This is also called an annual well check. Dental exams once or twice a year. Routine eye exams. Ask your health care provider how often you should have your eyes checked. Personal lifestyle choices, including: Daily care of your teeth and gums. Regular physical activity. Eating a healthy diet. Avoiding tobacco and drug use. Limiting alcohol use. Practicing safe sex. Taking low doses of aspirin every day. Taking vitamin and mineral supplements as recommended by your health care provider. What happens during an annual well check? The services and screenings done by your health care provider during your annual well check will depend on your age, overall health, lifestyle risk factors, and family history of disease. Counseling  Your health care provider may ask you questions about your: Alcohol  use. Tobacco use. Drug use. Emotional well-being. Home and relationship well-being. Sexual activity. Eating habits. History of falls. Memory and ability to understand (cognition). Work and work Astronomer. Screening  You may have the following tests or measurements: Height, weight, and BMI. Blood pressure. Lipid and cholesterol levels. These may be checked every 5 years, or more frequently if you are over 84 years old. Skin check. Lung cancer screening. You may have this screening every year starting at age 74 if you have a 30-pack-year history of smoking and currently smoke or have quit within the past 15 years. Fecal occult blood test (FOBT) of the stool. You may have this test every year starting at age 21. Flexible sigmoidoscopy or colonoscopy. You may have a sigmoidoscopy every 5 years or a colonoscopy every 10 years starting at age 13. Prostate cancer screening. Recommendations will vary depending on your family history and other risks. Hepatitis C blood test. Hepatitis B blood test. Sexually transmitted disease (STD) testing. Diabetes screening. This is done by checking your blood sugar (glucose) after you have not eaten for a while (fasting). You may have this done every 1-3 years. Abdominal aortic aneurysm (AAA) screening. You may need this if you are a current or former smoker. Osteoporosis. You may be screened starting at age 39 if you are at high risk. Talk with your health care provider about your test results, treatment options, and if necessary, the need for more tests. Vaccines  Your health care provider may recommend certain vaccines, such as: Influenza vaccine. This is recommended every year. Tetanus, diphtheria, and acellular pertussis (Tdap, Td) vaccine. You may need a Td booster every 10 years. Zoster vaccine. You may need this after age 3. Pneumococcal 13-valent conjugate (PCV13) vaccine. One dose is recommended after age  65. Pneumococcal polysaccharide  (PPSV23) vaccine. One dose is recommended after age 34. Talk to your health care provider about which screenings and vaccines you need and how often you need them. This information is not intended to replace advice given to you by your health care provider. Make sure you discuss any questions you have with your health care provider. Document Released: 06/12/2015 Document Revised: 02/03/2016 Document Reviewed: 03/17/2015 Elsevier Interactive Patient Education  2017 [place] Prevention in the Home Falls can cause injuries. They can happen to people of all ages. There are many things you can do to make your home safe and to help prevent falls. What can I do on the outside of my home? Regularly fix the edges of walkways and driveways and fix any cracks. Remove anything that might make you trip as you walk through a door, such as a raised step or threshold. Trim any bushes or trees on the path to your home. Use bright outdoor lighting. Clear any walking paths of anything that might make someone trip, such as rocks or tools. Regularly check to see if handrails are loose or broken. Make sure that both sides of any steps have handrails. Any raised decks and porches should have guardrails on the edges. Have any leaves, snow, or ice cleared regularly. Use sand or salt on walking paths during winter. Clean up any spills in your garage right away. This includes oil or grease spills. What can I do in the bathroom? Use night lights. Install grab bars by the toilet and in the tub and shower. Do not use towel bars as grab bars. Use non-skid mats or decals in the tub or shower. If you need to sit down in the shower, use a plastic, non-slip stool. Keep the floor dry. Clean up any water that spills on the floor as soon as it happens. Remove soap buildup in the tub or shower regularly. Attach bath mats securely with double-sided non-slip rug tape. Do not have throw rugs and other things on the  floor that can make you trip. What can I do in the bedroom? Use night lights. Make sure that you have a light by your bed that is easy to reach. Do not use any sheets or blankets that are too big for your bed. They should not hang down onto the floor. Have a firm chair that has side arms. You can use this for support while you get dressed. Do not have throw rugs and other things on the floor that can make you trip. What can I do in the kitchen? Clean up any spills right away. Avoid walking on wet floors. Keep items that you use a lot in easy-to-reach places. If you need to reach something above you, use a strong step stool that has a grab bar. Keep electrical cords out of the way. Do not use floor polish or wax that makes floors slippery. If you must use wax, use non-skid floor wax. Do not have throw rugs and other things on the floor that can make you trip. What can I do with my stairs? Do not leave any items on the stairs. Make sure that there are handrails on both sides of the stairs and use them. Fix handrails that are broken or loose. Make sure that handrails are as long as the stairways. Check any carpeting to make sure that it is firmly attached to the stairs. Fix any carpet that is loose or worn. Avoid having throw rugs at  the top or bottom of the stairs. If you do have throw rugs, attach them to the floor with carpet tape. Make sure that you have a light switch at the top of the stairs and the bottom of the stairs. If you do not have them, ask someone to add them for you. What else can I do to help prevent falls? Wear shoes that: Do not have high heels. Have rubber bottoms. Are comfortable and fit you well. Are closed at the toe. Do not wear sandals. If you use a stepladder: Make sure that it is fully opened. Do not climb a closed stepladder. Make sure that both sides of the stepladder are locked into place. Ask someone to hold it for you, if possible. Clearly mark and make  sure that you can see: Any grab bars or handrails. First and last steps. Where the edge of each step is. Use tools that help you move around (mobility aids) if they are needed. These include: Canes. Walkers. Scooters. Crutches. Turn on the lights when you go into a dark area. Replace any light bulbs as soon as they burn out. Set up your furniture so you have a clear path. Avoid moving your furniture around. If any of your floors are uneven, fix them. If there are any pets around you, be aware of where they are. Review your medicines with your doctor. Some medicines can make you feel dizzy. This can increase your chance of falling. Ask your doctor what other things that you can do to help prevent falls. This information is not intended to replace advice given to you by your health care provider. Make sure you discuss any questions you have with your health care provider. Document Released: 03/12/2009 Document Revised: 10/22/2015 Document Reviewed: 06/20/2014 Elsevier Interactive Patient Education  2017 [employer].

## 2021-05-03 NOTE — Progress Notes (Signed)
I connected with Isaiah Wilson today by telephone and verified that I am speaking with the correct person using two identifiers. Location patient: home Location provider: work Persons participating in the virtual visit: Isaiah Wilson, Glenna Durand LPN.   I discussed the limitations, risks, security and privacy concerns of performing an evaluation and management service by telephone and the availability of in person appointments. I also discussed with the patient that there may be a patient responsible charge related to this service. The patient expressed understanding and verbally consented to this telephonic visit.    Interactive audio and video telecommunications were attempted between this provider and patient, however failed, due to patient having technical difficulties OR patient did not have access to video capability.  We continued and completed visit with audio only.     Vital signs may be patient reported or missing.  Subjective:   Isaiah Wilson is a 86 y.o. male who presents for Medicare Annual/Subsequent preventive examination.  Review of Systems     Cardiac Risk Factors include: advanced age (>75men, >22 women);dyslipidemia;hypertension;male gender;obesity (BMI >30kg/m2)     Objective:    Today's Vitals   05/03/21 0947 05/03/21 0948  Weight: 235 lb (106.6 kg)   Height: 5' 11.5" (1.816 m)   PainSc:  7    Body mass index is 32.32 kg/m.  Advanced Directives 05/03/2021 08/18/2019 06/11/2019 03/13/2019 03/07/2019 04/18/2018 04/12/2018  Does Patient Have a Medical Advance Directive? Yes Yes No Yes No No No  Type of Paramedic of [place];Living will Healthcare Power of [place]  Does patient want to make changes to medical advance directive? - - - No - Patient declined - - -  Copy of [place] in Chart? No - copy requested - - No - copy requested - - -  Would patient like information on  creating a medical advance directive? - - No - Patient declined No - Patient declined No - Patient declined No - Patient declined No - Patient declined    Current Medications (verified) Outpatient Encounter Medications as of 05/03/2021  Medication Sig   acetaminophen (TYLENOL) 650 MG CR tablet Take 650 mg by mouth every 8 (eight) hours as needed for pain.   amLODipine (NORVASC) 5 MG tablet TAKE ONE TABLET BY MOUTH DAILY   aspirin EC 81 MG tablet Take 81 mg by mouth daily.   azelastine (ASTELIN) 0.1 % nasal spray Place 1 spray into both nostrils 2 (two) times daily as needed (allergies.).    doxazosin (CARDURA) 8 MG tablet TAKE ONE TABLET BY MOUTH EVERY NIGHT AT BEDTIME   EPINEPHrine 0.3 mg/0.3 mL IJ SOAJ injection Inject 0.3 mg into the muscle as needed (allergic reaction).    esomeprazole (NEXIUM) 40 MG capsule TAKE ONE CAPSULE BY MOUTH DAILY (TAKE ON AN EMPTY STOMACH 30 MINUTES PRIOR TO A MEAL) FOR BARRETT'S ESOPHAGUS   fluticasone (FLONASE) 50 MCG/ACT nasal spray Place 2 sprays into both nostrils daily.    losartan (COZAAR) 100 MG tablet TAKE ONE TABLET BY MOUTH DAILY   montelukast (SINGULAIR) 10 MG tablet TAKE ONE TABLET BY MOUTH EVERY NIGHT AT BEDTIME   rosuvastatin (CRESTOR) 40 MG tablet TAKE ONE-HALF TABLET BY MOUTH ONCE A DAY   zinc gluconate 50 MG tablet Take 50 mg by mouth daily.   clobetasol (TEMOVATE) 0.05 % external solution clobetasol 0.05 % scalp solution (Patient not taking: Reported on 05/03/2021)   loratadine (CLARITIN) 10 MG tablet 1  tablet (Patient not taking: Reported on 05/03/2021)   mupirocin ointment (BACTROBAN) 2 %  (Patient not taking: Reported on 05/03/2021)   sulfamethoxazole-trimethoprim (BACTRIM DS) 800-160 MG tablet  (Patient not taking: Reported on 05/03/2021)   tobramycin-dexamethasone (TOBRADEX) ophthalmic solution Apply to eye. (Patient not taking: Reported on 05/03/2021)   vancomycin (VANCOCIN) 1 g injection vancomycin 1,000 mg intravenous injection (Patient not  taking: Reported on 05/03/2021)   No facility-administered encounter medications on file as of 05/03/2021.    Allergies (verified) Patient has no known allergies.   History: Past Medical History:  Diagnosis Date   Abnormal EKG    hx left anterior fasicular block on old ekg 2017   Allergic rhinitis    Allergy    Barrett's esophagus    Benign prostatic hypertrophy    Colitis, ischemic (HCC)    X 2   Complication of anesthesia    SLOW TO AWAKEN AFTER 1 SURGERY 20  YRS AGO   DJD (degenerative joint disease)    SPINE, AND OA   GERD (gastroesophageal reflux disease)    H/O hiatal hernia    HOH (hard of hearing)    BOTH EARS   Hyperlipidemia    no medication per pt.   Hypertension    pt denies says cardura for bladder   Neuropathy     LEFT HAND AND ARM NUMB ALL THE TIME   Overweight(278.02)    Past Surgical History:  Procedure Laterality Date   2 neck surgeries  2005-2006   Dr. Georgia Dom cx decompression, diskectomy,fusion C6-7 allograftWITH PLATE   APPENDECTOMY     EYE SURGERY     bilateral cataract surgerywith lens implant   FOOT SURGERY     right x 3   INNER EAR SURGERY Right    07/2020   LAPAROSCOPIC CHOLECYSTECTOMY  1993   Dr. Luan Moore   left total knee replacement  07/2007   Dr. Eulah Pont    right total knee replacement  2012   Dr. Romie Jumper  1975, May 2013   x3   TONSILLECTOMY  as child   TOTAL HIP ARTHROPLASTY Left 04/18/2018   Procedure: LEFT TOTAL HIP ARTHROPLASTY ANTERIOR APPROACH;  Surgeon: Ollen Gross, MD;  Location: [facility];  Service: Orthopedics;  Laterality: Left;   TOTAL HIP ARTHROPLASTY Right 03/13/2019   Procedure: TOTAL HIP ARTHROPLASTY ANTERIOR APPROACH;  Surgeon: Ollen Gross, MD;  Location: [facility];  Service: Orthopedics;  Laterality: Right;    TOTAL KNEE REVISION Left 11/11/2015   Procedure: LEFT KNEE POLYETHELENE REVISION;  Surgeon: Ollen Gross, MD;  Location: [facility];  Service: Orthopedics;  Laterality: Left;   TOTAL  KNEE REVISION Left 12/07/2016   Procedure: LEFT TOTAL KNEE REVISION;  Surgeon: Ollen Gross, MD;  Location: [facility];  Service: Orthopedics;  Laterality: Left;  Adductor Block   Family History  Problem Relation Age of Onset   Leukemia Mother    Heart disease Father    Heart disease Sister    Colon cancer Neg Hx    Stomach cancer Neg Hx    Rectal cancer Neg Hx    Esophageal cancer Neg Hx    Social History   Socioeconomic History   Marital status: Married    Spouse name: Not on file   Number of children: 2   Years of education: some college   Highest education level: Not on file  Occupational History   Occupation: retired  Tobacco Use   Smoking status: Never   Smokeless tobacco: Never  Vaping Use   Vaping Use: Never used  Substance and Sexual Activity   Alcohol use: No    Alcohol/week: 0.0 standard drinks   Drug use: No   Sexual activity: Not on file  Other Topics Concern   Not on file  Social History Narrative   Married   [place] 2   2 children; 3 granchildren; 2 great grandchildren   Social Determinants of Health   Financial Resource Strain: Low Risk    Difficulty of Paying Living Expenses: Not hard at all  Food Insecurity: No Food Insecurity   Worried About Charity fundraiser in the Last Year: Never true   Arboriculturist in the Last Year: Never true  Transportation Needs: No Transportation Needs   Lack of Transportation (Medical): No   Lack of Transportation (Non-Medical): No  Physical Activity: Inactive   Days of Exercise per Week: 0 days   Minutes of Exercise per Session: 0 min  Stress: Stress Concern Present   Feeling of Stress : To some extent  Social Connections: Not on file    Tobacco Counseling Counseling given: Not Answered   Clinical Intake:  Pre-visit preparation completed: Yes  Pain : 0-10 Pain Score: 7  Pain Type: Chronic pain Pain Location: Leg Pain Orientation: Right, Left Pain Descriptors / Indicators: Constant Pain Onset: More than  a month ago Pain Frequency: Constant     Nutritional Status: BMI > 30  Obese Nutritional Risks: None Diabetes: No  How often do you need to have someone help you when you read instructions, pamphlets, or other written materials from your doctor or pharmacy?: 1 - Never What is the last grade level you completed in school?: 3 yrs college  Diabetic? no  Interpreter Needed?: No  Information entered by :: NAllen LPN   Activities of Daily Living In your present state of health, do you have any difficulty performing the following activities: 05/03/2021  Hearing? Y  Comment can't hear out of right ear  Vision? N  Difficulty concentrating or making decisions? N  Walking or climbing stairs? N  Dressing or bathing? N  Doing errands, shopping? N  Preparing Food and eating ? N  Using the Toilet? N  In the past six months, have you accidently leaked urine? Y  Do you have problems with loss of bowel control? N  Managing your Medications? N  Managing your Finances? N  Housekeeping or managing your Housekeeping? N  Some recent data might be hidden    Patient Care Team: [name], MD as PCP - General (Family Medicine)  Indicate any recent Medical Services you may have received from other than [facility] providers in the past year (date may be approximate).     Assessment:   This is a routine wellness examination for Kayo.  Hearing/Vision screen Vision Screening - Comments:: Regular eye exams,   Dietary issues and exercise activities discussed: Current Exercise Habits: The patient does not participate in regular exercise at present   Goals Addressed             This Visit's Progress    Patient Stated       05/03/2021, no goals       Depression Screen PHQ 2/9 Scores 05/03/2021 06/11/2019 05/19/2019 02/07/2018 11/22/2017 11/16/2016 09/02/2015  PHQ - 2 Score 0 0 1 0 0 0 0    Fall Risk Fall Risk  05/03/2021 06/11/2019 05/19/2019 02/07/2018 11/22/2017  Falls in the past year? 1  0 0 No No  Comment lost balance - - - -  Number falls in past yr: 0 - 0 - -  Injury with Fall? 0 - - - -  Risk for fall due to : Impaired balance/gait;Medication side effect Medication side effect Orthopedic patient - -  Follow up Falls evaluation completed;Education provided;Falls prevention discussed Falls prevention discussed;Education provided;Falls evaluation completed - - -    FALL RISK PREVENTION PERTAINING TO THE HOME:  Any stairs in or around the home? No  If so, are there any without handrails? N/a Home free of loose throw rugs in walkways, pet beds, electrical cords, etc? Yes  Adequate lighting in your home to reduce risk of falls? Yes   ASSISTIVE DEVICES UTILIZED TO PREVENT FALLS:  Life alert? No  Use of a cane, walker or w/c? No  Grab bars in the bathroom? No  Shower chair or bench in shower? Yes  Elevated toilet seat or a handicapped toilet? Yes   TIMED UP AND GO:  Was the test performed? No .      Cognitive Function: MMSE - Mini Mental State Exam 02/07/2018  Not completed: (No Data)     6CIT Screen 05/03/2021 06/11/2019  What Year? 0 points 0 points  What month? 0 points 0 points  What time? 0 points 0 points  Count back from 20 0 points 0 points  Months in reverse 0 points 0 points  Repeat phrase 0 points 0 points  Total Score 0 0    Immunizations Immunization History  Administered Date(s) Administered   Fluad Quad(high Dose 65+) 03/18/2020, 02/09/2021   H1N1 07/03/2008   Influenza Split 03/04/2011, 02/07/2012   Influenza Whole 03/27/2007, 02/27/2008, 03/13/2009   Influenza, High Dose Seasonal PF 04/09/2015, 03/01/2016, 03/06/2017, 02/07/2018, 02/28/2019   Influenza,inj,Quad PF,6+ Mos 02/08/2013, 02/17/2014   Influenza-Unspecified 04/05/2017   PFIZER(Purple Top)SARS-COV-2 Vaccination 06/20/2019, 07/11/2019, 03/21/2020, 12/07/2020   Pneumococcal Conjugate-13 02/17/2014   Pneumococcal Polysaccharide-23 11/16/2016   Td 06/22/2009   Zoster  Recombinat (Shingrix) 11/24/2017, 02/23/2018   Zoster, Live 02/23/2018    TDAP status: Due, Education has been provided regarding the importance of this vaccine. Advised may receive this vaccine at local pharmacy or Health Dept. Aware to provide a copy of the vaccination record if obtained from local pharmacy or Health Dept. Verbalized acceptance and understanding.  Flu Vaccine status: Up to date  Pneumococcal vaccine status: Up to date  Covid-19 vaccine status: Completed vaccines  Qualifies for Shingles Vaccine? Yes   Zostavax completed Yes   Shingrix Completed?: Yes  Screening Tests Health Maintenance  Topic Date Due   TETANUS/TDAP  06/23/2019   COVID-19 Vaccine (5 - Booster for Pfizer series) 02/01/2021   Pneumonia Vaccine 10+ Years old  Completed   INFLUENZA VACCINE  Completed   Zoster Vaccines- Shingrix  Completed   HPV VACCINES  Aged Out    Health Maintenance  Health Maintenance Due  Topic Date Due   TETANUS/TDAP  06/23/2019   COVID-19 Vaccine (5 - Booster for Pfizer series) 02/01/2021    Colorectal cancer screening: No longer required.   Lung Cancer Screening: (Low Dose CT Chest recommended if Age 69-80 years, 30 pack-year currently smoking OR have quit w/in 15years.) does not qualify.   Lung Cancer Screening Referral: no  Additional Screening:  Hepatitis C Screening: does not qualify;   Vision Screening: Recommended annual ophthalmology exams for early detection of glaucoma and other disorders of the eye. Is the patient up to date with their annual eye exam?  Yes  Who is  the provider or what is the name of the office in which the patient attends annual eye exams? Can't remember name If pt is not established with a provider, would they like to be referred to a provider to establish care? No .   Dental Screening: Recommended annual dental exams for proper oral hygiene  Community Resource Referral / Chronic Care Management: CRR required this visit?  No    CCM required this visit?  No      Plan:     I have personally reviewed and noted the following in the patient's chart:   Medical and social history Use of alcohol, tobacco or illicit drugs  Current medications and supplements including opioid prescriptions. Patient is not currently taking opioid prescriptions. Functional ability and status Nutritional status Physical activity Advanced directives List of other physicians Hospitalizations, surgeries, and ER visits in previous 12 months Vitals Screenings to include cognitive, depression, and falls Referrals and appointments  In addition, I have reviewed and discussed with patient certain preventive protocols, quality metrics, and best practice recommendations. A written personalized care plan for preventive services as well as general preventive health recommendations were provided to patient.     Kellie Simmering, LPN   [id number]   Nurse Notes: none

## 2021-05-11 DIAGNOSIS — L820 Inflamed seborrheic keratosis: Secondary | ICD-10-CM | POA: Diagnosis not present

## 2021-05-11 DIAGNOSIS — L258 Unspecified contact dermatitis due to other agents: Secondary | ICD-10-CM | POA: Diagnosis not present

## 2021-05-11 DIAGNOSIS — D225 Melanocytic nevi of trunk: Secondary | ICD-10-CM | POA: Diagnosis not present

## 2021-05-11 DIAGNOSIS — L218 Other seborrheic dermatitis: Secondary | ICD-10-CM | POA: Diagnosis not present

## 2021-05-12 DIAGNOSIS — J301 Allergic rhinitis due to pollen: Secondary | ICD-10-CM | POA: Diagnosis not present

## 2021-05-14 DIAGNOSIS — Z9622 Myringotomy tube(s) status: Secondary | ICD-10-CM | POA: Diagnosis not present

## 2021-05-14 DIAGNOSIS — Z4589 Encounter for adjustment and management of other implanted devices: Secondary | ICD-10-CM | POA: Diagnosis not present

## 2021-05-14 DIAGNOSIS — I951 Orthostatic hypotension: Secondary | ICD-10-CM | POA: Diagnosis not present

## 2021-05-14 DIAGNOSIS — Z974 Presence of external hearing-aid: Secondary | ICD-10-CM | POA: Diagnosis not present

## 2021-05-14 DIAGNOSIS — R001 Bradycardia, unspecified: Secondary | ICD-10-CM | POA: Diagnosis not present

## 2021-05-14 DIAGNOSIS — H90A31 Mixed conductive and sensorineural hearing loss, unilateral, right ear with restricted hearing on the contralateral side: Secondary | ICD-10-CM | POA: Diagnosis not present

## 2021-05-14 DIAGNOSIS — Z79899 Other long term (current) drug therapy: Secondary | ICD-10-CM | POA: Diagnosis not present

## 2021-05-14 DIAGNOSIS — Z9089 Acquired absence of other organs: Secondary | ICD-10-CM | POA: Diagnosis not present

## 2021-05-14 DIAGNOSIS — H90A22 Sensorineural hearing loss, unilateral, left ear, with restricted hearing on the contralateral side: Secondary | ICD-10-CM | POA: Diagnosis not present

## 2021-05-14 DIAGNOSIS — Z9989 Dependence on other enabling machines and devices: Secondary | ICD-10-CM | POA: Diagnosis not present

## 2021-05-18 ENCOUNTER — Ambulatory Visit: Payer: Medicare PPO | Admitting: Family Medicine

## 2021-05-18 VITALS — BP 124/64 | HR 55 | Temp 97.8°F | Wt 245.0 lb

## 2021-05-18 DIAGNOSIS — R42 Dizziness and giddiness: Secondary | ICD-10-CM | POA: Diagnosis not present

## 2021-05-18 DIAGNOSIS — I10 Essential (primary) hypertension: Secondary | ICD-10-CM | POA: Diagnosis not present

## 2021-05-18 NOTE — Progress Notes (Signed)
Established Patient Office Visit  Subjective:  Patient ID: Isaiah Wilson, male    DOB: Sep 29, 1934  Age: 86 y.o. MRN: [id number]  CC:  Chief Complaint  Patient presents with   Dizziness    HPI KOLTER REAVER presents for chronic vertigo symptoms which he estimates have been going on for at least 2 years.  He states his symptoms are almost daily and seem to be exacerbated by periods of stress.  He recently saw ENT and there were no signs of any chronic ear infection.  He has some chronic bilateral hearing loss.  No history of Mnire's disease.  Patient had MRI brain 5/21 and apparently vertigo symptoms were already going on at that point.  No acute findings.  No tumor.  He denies any ataxia, focal weakness, speech changes, swallowing difficulties, or any other focal neurologic symptoms.  He has seen ENT as above.  No recent headaches.  Does not seem to have a clear consistent directional component to his vertigo  He has hypertension treated with amlodipine, doxazosin, losartan.  Denies any recent orthostatic symptoms.  No chest pain.  Past Medical History:  Diagnosis Date   Abnormal EKG    hx left anterior fasicular block on old ekg 2017   Allergic rhinitis    Allergy    Barrett's esophagus    Benign prostatic hypertrophy    Colitis, ischemic (HCC)    X 2   Complication of anesthesia    SLOW TO AWAKEN AFTER 1 SURGERY 20  YRS AGO   DJD (degenerative joint disease)    SPINE, AND OA   GERD (gastroesophageal reflux disease)    H/O hiatal hernia    HOH (hard of hearing)    BOTH EARS   Hyperlipidemia    no medication per pt.   Hypertension    pt denies says cardura for bladder   Neuropathy     LEFT HAND AND ARM NUMB ALL THE TIME   Overweight(278.02)     Past Surgical History:  Procedure Laterality Date   2 neck surgeries  2005-2006   Dr. Helane Gunther cx decompression, diskectomy,fusion C6-7 allograftWITH PLATE   APPENDECTOMY     EYE SURGERY     bilateral cataract  surgerywith lens implant   FOOT SURGERY     right x 3   INNER EAR SURGERY Right    07/2020   LAPAROSCOPIC CHOLECYSTECTOMY  1993   Dr. Charlynne Pander   left total knee replacement  07/2007   Dr. Percell Miller    right total knee replacement  2012   Dr. Thelma Comp  1975, May 2013   x3   TONSILLECTOMY  as child   TOTAL HIP ARTHROPLASTY Left 04/18/2018   Procedure: LEFT TOTAL HIP ARTHROPLASTY ANTERIOR APPROACH;  Surgeon: Gaynelle Arabian, MD;  Location: [facility];  Service: Orthopedics;  Laterality: Left;   TOTAL HIP ARTHROPLASTY Right 03/13/2019   Procedure: TOTAL HIP ARTHROPLASTY ANTERIOR APPROACH;  Surgeon: Gaynelle Arabian, MD;  Location: [facility];  Service: Orthopedics;  Laterality: Right;  1107mn   TOTAL KNEE REVISION Left 11/11/2015   Procedure: LEFT KNEE POLYETHELENE REVISION;  Surgeon: FGaynelle Arabian MD;  Location: [facility];  Service: Orthopedics;  Laterality: Left;   TOTAL KNEE REVISION Left 12/07/2016   Procedure: LEFT TOTAL KNEE REVISION;  Surgeon: AGaynelle Arabian MD;  Location[facility];  Service: Orthopedics;  Laterality: Left;  Adductor Block    Family History  Problem Relation Age of Onset   Leukemia Mother  Heart disease Father    Heart disease Sister    Colon cancer Neg Hx    Stomach cancer Neg Hx    Rectal cancer Neg Hx    Esophageal cancer Neg Hx     Social History   Socioeconomic History   Marital status: Married    Spouse name: Not on file   Number of children: 2   Years of education: some college   Highest education level: Not on file  Occupational History   Occupation: retired  Tobacco Use   Smoking status: Never   Smokeless tobacco: Never  Vaping Use   Vaping Use: Never used  Substance and Sexual Activity   Alcohol use: No    Alcohol/week: 0.0 standard drinks   Drug use: No   Sexual activity: Not on file  Other Topics Concern   Not on file  Social History Narrative   Married   [place] 2   2 children; 3 granchildren; 2 great grandchildren   Social  Determinants of Health   Financial Resource Strain: Low Risk    Difficulty of Paying Living Expenses: Not hard at all  Food Insecurity: No Food Insecurity   Worried About Charity fundraiser in the Last Year: Never true   Arboriculturist in the Last Year: Never true  Transportation Needs: No Transportation Needs   Lack of Transportation (Medical): No   Lack of Transportation (Non-Medical): No  Physical Activity: Inactive   Days of Exercise per Week: 0 days   Minutes of Exercise per Session: 0 min  Stress: Stress Concern Present   Feeling of Stress : To some extent  Social Connections: Not on file  Intimate Partner Violence: Not on file    Outpatient Medications Prior to Visit  Medication Sig Dispense Refill   acetaminophen (TYLENOL) 650 MG CR tablet Take 650 mg by mouth every 8 (eight) hours as needed for pain.     amLODipine (NORVASC) 5 MG tablet TAKE ONE TABLET BY MOUTH DAILY 90 tablet 3   aspirin EC 81 MG tablet Take 81 mg by mouth daily.     azelastine (ASTELIN) 0.1 % nasal spray Place 1 spray into both nostrils 2 (two) times daily as needed (allergies.).      clobetasol (TEMOVATE) 0.05 % external solution      doxazosin (CARDURA) 8 MG tablet TAKE ONE TABLET BY MOUTH EVERY NIGHT AT BEDTIME 60 tablet 0   EPINEPHrine 0.3 mg/0.3 mL IJ SOAJ injection Inject 0.3 mg into the muscle as needed (allergic reaction).      esomeprazole (NEXIUM) 40 MG capsule TAKE ONE CAPSULE BY MOUTH DAILY (TAKE ON AN EMPTY STOMACH 30 MINUTES PRIOR TO A MEAL) FOR BARRETT'S ESOPHAGUS     fluticasone (FLONASE) 50 MCG/ACT nasal spray Place 2 sprays into both nostrils daily.      loratadine (CLARITIN) 10 MG tablet      losartan (COZAAR) 100 MG tablet TAKE ONE TABLET BY MOUTH DAILY 90 tablet 0   montelukast (SINGULAIR) 10 MG tablet TAKE ONE TABLET BY MOUTH EVERY NIGHT AT BEDTIME 90 tablet 0   mupirocin ointment (BACTROBAN) 2 %      rosuvastatin (CRESTOR) 40 MG tablet TAKE ONE-HALF TABLET BY MOUTH ONCE A DAY      sulfamethoxazole-trimethoprim (BACTRIM DS) 800-160 MG tablet      tobramycin-dexamethasone (TOBRADEX) ophthalmic solution Apply to eye.     vancomycin (VANCOCIN) 1 g injection      zinc gluconate 50 MG tablet Take 50 mg  by mouth daily.     No facility-administered medications prior to visit.    No Known Allergies  ROS Review of Systems  Constitutional:  Negative for chills and fever.  HENT:  Positive for hearing loss.   Respiratory:  Negative for cough and shortness of breath.   Cardiovascular:  Negative for chest pain, palpitations and leg swelling.  Neurological:  Positive for dizziness. Negative for seizures, syncope, speech difficulty, weakness and headaches.     Objective:    Physical Exam Vitals reviewed.  Constitutional:      Appearance: Normal appearance.  Cardiovascular:     Rate and Rhythm: Normal rate.  Pulmonary:     Effort: Pulmonary effort is normal.     Breath sounds: Normal breath sounds.  Neurological:     General: No focal deficit present.     Mental Status: He is alert.     Cranial Nerves: No cranial nerve deficit.     Comments: No ataxia by finger-nose testing.  No focal extremity weakness.  Gait is normal unassisted    BP 124/64 (BP Location: Left Arm, Patient Position: Sitting, Cuff Size: Normal)    Pulse (!) 55    Temp 97.8 F (36.6 C) (Oral)    Wt 245 lb (111.1 kg)    SpO2 98%    BMI 33.69 kg/m  Wt Readings from Last 3 Encounters:  05/18/21 245 lb (111.1 kg)  05/03/21 235 lb (106.6 kg)  02/09/21 244 lb 1.6 oz (110.7 kg)     Health Maintenance Due  Topic Date Due   TETANUS/TDAP  06/23/2019    There are no preventive care reminders to display for this patient.  Lab Results  Component Value Date   TSH 2.69 11/04/2020   Lab Results  Component Value Date   WBC 9.0 11/04/2020   HGB 13.2 11/04/2020   HCT 39.1 11/04/2020   MCV 86.5 11/04/2020   PLT 180.0 11/04/2020   Lab Results  Component Value Date   NA 140 11/04/2020   K 3.8  11/04/2020   CO2 25 11/04/2020   GLUCOSE 118 (H) 11/04/2020   BUN 15 11/04/2020   CREATININE 1.04 11/04/2020   BILITOT 0.4 02/10/2021   ALKPHOS 61 02/10/2021   AST 24 02/10/2021   ALT 20 02/10/2021   PROT 6.5 02/10/2021   ALBUMIN 4.3 02/10/2021   CALCIUM 8.8 11/04/2020   ANIONGAP 9 08/18/2019   EGFR 64 10/07/2020   GFR 66.37 11/04/2020   Lab Results  Component Value Date   CHOL 118 02/10/2021   Lab Results  Component Value Date   HDL 39 (L) 02/10/2021   Lab Results  Component Value Date   LDLCALC 52 02/10/2021   Lab Results  Component Value Date   TRIG 162 (H) 02/10/2021   Lab Results  Component Value Date   CHOLHDL 3.0 02/10/2021   Lab Results  Component Value Date   HGBA1C 5.7 (A) 02/07/2018      Assessment & Plan:   #1 chronic vertigo symptoms.  Etiology unclear.  He has seen ENT previously.  He feels at risk for fall.  Set up physical therapy for fall risk reduction  #2 hypertension stable and at goal.  Continue current medication as above.   No orders of the defined types were placed in this encounter.   Follow-up: No follow-ups on file.    Carolann Littler, MD

## 2021-05-26 ENCOUNTER — Encounter: Payer: Self-pay | Admitting: Physical Therapy

## 2021-05-26 ENCOUNTER — Ambulatory Visit: Payer: Medicare PPO | Attending: Family Medicine | Admitting: Physical Therapy

## 2021-05-26 ENCOUNTER — Other Ambulatory Visit: Payer: Self-pay

## 2021-05-26 DIAGNOSIS — R42 Dizziness and giddiness: Secondary | ICD-10-CM | POA: Insufficient documentation

## 2021-05-26 DIAGNOSIS — R2681 Unsteadiness on feet: Secondary | ICD-10-CM | POA: Diagnosis not present

## 2021-05-26 NOTE — Therapy (Signed)
[facility] [place], [place], [place], [address] Phone: [phone]   Fax:  [phone]  Physical Therapy Evaluation  Patient Details  Name: Isaiah Wilson MRN: [id number] Date of Birth: Oct 16, 1934 Referring Provider (PT): [name], MD   Encounter Date: 05/26/2021   PT End of Session - 05/26/21 1508     Visit Number 1    Number of Visits 7    Date for PT Re-Evaluation 07/07/21    Authorization Type [employer]    PT Start Time [id number]   pt late   PT Stop Time 1500    PT Time Calculation (min) 47 min    Equipment Utilized During Treatment Gait belt    Activity Tolerance Patient tolerated treatment well    Behavior During Therapy [facility] for tasks assessed/performed             Past Medical History:  Diagnosis Date   Abnormal EKG    hx left anterior fasicular block on old ekg 2017   Allergic rhinitis    Allergy    Barrett's esophagus    Benign prostatic hypertrophy    Colitis, ischemic (HCC)    X 2   Complication of anesthesia    SLOW TO AWAKEN AFTER 1 SURGERY 20  YRS AGO   DJD (degenerative joint disease)    SPINE, AND OA   GERD (gastroesophageal reflux disease)    H/O hiatal hernia    HOH (hard of hearing)    BOTH EARS   Hyperlipidemia    no medication per pt.   Hypertension    pt denies says cardura for bladder   Neuropathy     LEFT HAND AND ARM NUMB ALL THE TIME   Overweight(278.02)     Past Surgical History:  Procedure Laterality Date   2 neck surgeries  2005-2006   Dr. Helane Gunther cx decompression, diskectomy,fusion C6-7 allograftWITH PLATE   APPENDECTOMY     EYE SURGERY     bilateral cataract surgerywith lens implant   FOOT SURGERY     right x 3   INNER EAR SURGERY Right    07/2020   LAPAROSCOPIC CHOLECYSTECTOMY  1993   Dr. Charlynne Pander   left total knee replacement  07/2007   Dr. Percell Miller    right total knee replacement  2012   Dr. Thelma Comp  1975, May 2013   x3    TONSILLECTOMY  as child   TOTAL HIP ARTHROPLASTY Left 04/18/2018   Procedure: LEFT TOTAL HIP ARTHROPLASTY ANTERIOR APPROACH;  Surgeon: Gaynelle Arabian, MD;  Location: [facility];  Service: Orthopedics;  Laterality: Left;   TOTAL HIP ARTHROPLASTY Right 03/13/2019   Procedure: TOTAL HIP ARTHROPLASTY ANTERIOR APPROACH;  Surgeon: Gaynelle Arabian, MD;  Location: [facility];  Service: Orthopedics;  Laterality: Right;  142min   TOTAL KNEE REVISION Left 11/11/2015   Procedure: LEFT KNEE POLYETHELENE REVISION;  Surgeon: Gaynelle Arabian, MD;  Location: [facility];  Service: Orthopedics;  Laterality: Left;   TOTAL KNEE REVISION Left 12/07/2016   Procedure: LEFT TOTAL KNEE REVISION;  Surgeon: Gaynelle Arabian, MD;  Location: [facility];  Service: Orthopedics;  Laterality: Left;  Adductor Block    There were no vitals filed for this visit.    Subjective Assessment - 05/26/21 1415     Subjective Patient reports dizziness for several months. Has had a work up without many answers. Dizziness is constant- worse with standing, movement, stress. Better when sitting. Reports that he is  the caregiver for his wife who is 85% dependent which puts a lot of stress on him. Describes sensation as "unsteady." Fell on Labor Day and hit a granite table- denies head trauma. However, has hx head trauma 2 years ago. Had an ear infection in the R ear off and on and had surgery in March d/t antibiotics not helping. Now notes full loss of hearing in the R ear since the surgery and tinnitus at baseline. Denies vision changes/double vision.    Pertinent History B HOH, HLD, HTN, neuropathy, neck surgeries 2005 & 2006, R foot surgery x3, B TKA with L knee revision x2, B THA    Limitations Lifting;Standing;Walking;House hold activities    Diagnostic tests none recent    Patient Stated Goals improve dizziness    Currently in Pain? No/denies   reports chronic B knee pain, not reason for referral               [facility] PT Assessment - 05/26/21 1504        Ambulation/Gait   Assistive device Straight cane    Gait Pattern Step-through pattern;Trunk flexed    Gait velocity decreased      Standardized Balance Assessment   Standardized Balance Assessment --   unable to maintain balance in [place]berg with EC                   Vestibular Assessment - 05/26/21 1425       Oculomotor Exam   Oculomotor Alignment Normal    Ocular ROM WFL    Spontaneous Absent    Gaze-induced  Absent    Smooth Pursuits Saccades   mild corrective saccades superiorly; c/o mild dizziness   Saccades Undershoots   in vertical and horizontal directions; c/o dizziness   Comment convergence: ~3 inch      Oculomotor Exam-Fixation Suppressed    Left Head Impulse slightly positive    Right Head Impulse slightly positive      Vestibulo-Ocular Reflex   VOR 1 Head Only (x 1 viewing) poor gaze stability to L head turn, intact vertical    VOR Cancellation Normal      Positional Testing   Sidelying Test Sidelying Right;Sidelying Left    Horizontal Canal Testing Horizontal Canal Right;Horizontal Canal Left      Sidelying Right   Sidelying Right Duration 0    Sidelying Right Symptoms No nystagmus   c/o mild wooziness upon sitting up     Sidelying Left   Sidelying Left Duration 0    Sidelying Left Symptoms No nystagmus      Horizontal Canal Right   Horizontal Canal Right Duration 0    Horizontal Canal Right Symptoms Normal      Horizontal Canal Left   Horizontal Canal Left Duration 0    Horizontal Canal Left Symptoms Normal                Objective measurements completed on examination: See above findings.                PT Education - 05/26/21 1507     Education Details prognosis, POC, HEP to be performed at counter for safety-Access Code: [email or web address]    Person(s) Educated Patient    Methods Explanation;Demonstration;Tactile cues;Verbal cues;Handout    Comprehension Verbalized understanding;Returned demonstration               PT Short Term Goals - 05/26/21 1514       PT SHORT TERM GOAL #1   Title  Patient to be independent with initial HEP.    Time 2    Period Weeks    Status New    Target Date 06/09/21               PT Long Term Goals - 05/26/21 1514       PT LONG TERM GOAL #1   Title Patient to be independent with advanced HEP.    Time 6    Period Weeks    Status New    Target Date 07/07/21      PT LONG TERM GOAL #2   Title Patient to report 70% improvement in balance when moving/walking.    Time 6    Period Weeks    Status New    Target Date 07/07/21      PT LONG TERM GOAL #3   Title Patient to demonstrate good gaze stability with head turns during ambulation while maintaining good stability with LRAD.    Time 6    Period Weeks    Status New    Target Date 07/07/21      PT LONG TERM GOAL #4   Title Patient to score at least 20/24 on DGI in order to decrease risk of falls.    Time 6    Period Weeks    Status New    Target Date 07/07/21                    Plan - 05/26/21 1509     Clinical Impression Statement Patient is an 86y/o M presenting to OPPT with c/o chronic vertigo for the past several months. Dizziness is constant but worse with standing, movement, and stress. 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 Patient recalls a R ear surgery d/t a chronic ear infection which resulting in loss of hearing in the R ear, however reports remaining tinnitus from baseline. Denies new head trauma, or changes in vision. Patient today presenting with mild saccades with smooth pursuit, undershooting with saccadic testing, slightly positive B HIT, decreased VOR with L head turn, and mild motion sensitivity to R. Coordination testing revealed slight dysmetria and tremor in L hand. Imbalance and c/o dizziness evident with gait with head turn/nods and with narrow BOS with EC. Patient was educated on balance HEP to be performed at counter top for safety and  reported understanding. Would benefit from skilled PT services 1x/week for 6 weeks to address aforementioned impairments in order to optimize level of function.    Personal Factors and Comorbidities Age;Comorbidity 3+;Time since onset of injury/illness/exacerbation;Past/Current Experience    Comorbidities B HOH, HLD, HTN, neuropathy, neck surgeries 2005 & 2006, R foot surgery x3, B TKA with L knee revision x2, B THA    Examination-Activity Limitations Locomotion Level;Transfers;Reach Overhead;Bend;Caring for Others;Lift;Stand;Stairs    Examination-Participation Restrictions Church;Meal Prep;Cleaning;Community Activity;Driving;Shop;Laundry    Stability/Clinical Decision Making Evolving/Moderate complexity    Clinical Decision Making Moderate    Rehab Potential Good    PT Frequency 1x / week    PT Duration 6 weeks    PT Treatment/Interventions ADLs/Self Care Home Management;Canalith Repostioning;Cryotherapy;DME Instruction;Moist Heat;Gait training;Stair training;Functional mobility training;Therapeutic activities;Therapeutic exercise;Balance training;Neuromuscular re-education;Manual techniques;Vestibular    PT Next Visit Plan reassess HEP; assess full DGI, progress VOR in standing/with gait and balance    Consulted and Agree with Plan of Care Patient             Patient will benefit from skilled therapeutic intervention in order to  improve the following deficits and impairments:  Abnormal gait, Decreased coordination, Dizziness, Decreased activity tolerance, Decreased balance  Visit Diagnosis: Dizziness and giddiness  Unsteadiness on feet     Problem List Patient Active Problem List   Diagnosis Date Noted   Sensorineural hearing loss (SNHL), bilateral 12/04/2019   OA (osteoarthritis) of hip 04/18/2018   Acute suppurative otitis media of left ear without spontaneous rupture of tympanic membrane 03/06/2018   Aftercare 12/15/2017   Acute serous otitis media of left ear 06/20/2017    Conductive hearing loss of both ears 06/20/2017   History of total bilateral knee replacement 06/18/2017   History of total knee replacement, right 06/13/2017   Pain in left knee 05/31/2017   OA (osteoarthritis) of knee 12/07/2016   Failed total knee arthroplasty ([place]) 11/11/2015   Failed total knee, left ([place]) 11/11/2015   Eustachian tube dysfunction, bilateral 07/15/2015   Suppurative otitis media of right ear without rupture of tympanic membrane 07/15/2015   Obesity (BMI 30-39.9) 02/17/2014   Seborrheic keratoses 02/08/2013   Paresthesia 02/09/2011   OVERWEIGHT 07/05/2008   HIATAL HERNIA 09/25/2007   Benign prostatic hyperplasia 07/14/2007   ISCHEMIC COLITIS 07/12/2007   HYPERLIPIDEMIA 05/07/2007   ANXIETY 05/07/2007   NEUROPATHY 05/07/2007   Essential hypertension 05/07/2007   ALLERGIC RHINITIS 05/07/2007   GERD 05/07/2007   BARRETTS ESOPHAGUS 05/07/2007   Osteoarthritis 05/07/2007     Janene Harvey, PT, DPT 05/26/21 3:18 PM   [facility] [address] [place], [place], [address] Phone: [phone]   Fax:  [phone]  Name: AIKEEM BRONSTEIN MRN: [id number] Date of Birth: January 14, 1935

## 2021-06-07 ENCOUNTER — Other Ambulatory Visit: Payer: Self-pay

## 2021-06-07 ENCOUNTER — Encounter: Payer: Self-pay | Admitting: Physical Therapy

## 2021-06-07 ENCOUNTER — Ambulatory Visit: Payer: Medicare PPO | Attending: Family Medicine | Admitting: Physical Therapy

## 2021-06-07 DIAGNOSIS — R42 Dizziness and giddiness: Secondary | ICD-10-CM | POA: Insufficient documentation

## 2021-06-07 DIAGNOSIS — J301 Allergic rhinitis due to pollen: Secondary | ICD-10-CM | POA: Diagnosis not present

## 2021-06-07 DIAGNOSIS — R2681 Unsteadiness on feet: Secondary | ICD-10-CM | POA: Insufficient documentation

## 2021-06-07 NOTE — Therapy (Signed)
[facility] [address] [place], [place], [place], [address] Phone: [phone]   Fax:  [phone]  Physical Therapy Treatment  Patient Details  Name: Isaiah Wilson MRN: [id number] Date of Birth: 07-16-1934 Referring Provider (PT): [name], MD   Encounter Date: 06/07/2021   PT End of Session - 06/07/21 1612     Visit Number 2    Number of Visits 7    Date for PT Re-Evaluation 07/07/21    Authorization Type [employer]    Authorization Time Period 6 visits from 12/28-02/08    Authorization - Visit Number 2    Authorization - Number of Visits 6    PT Start Time 1525    PT Stop Time 1611    PT Time Calculation (min) 46 min    Equipment Utilized During Treatment Gait belt    Activity Tolerance Patient tolerated treatment well    Behavior During Therapy [facility] for tasks assessed/performed             Past Medical History:  Diagnosis Date   Abnormal EKG    hx left anterior fasicular block on old ekg 2017   Allergic rhinitis    Allergy    Barrett's esophagus    Benign prostatic hypertrophy    Colitis, ischemic (HCC)    X 2   Complication of anesthesia    SLOW TO AWAKEN AFTER 1 SURGERY 20  YRS AGO   DJD (degenerative joint disease)    SPINE, AND OA   GERD (gastroesophageal reflux disease)    H/O hiatal hernia    HOH (hard of hearing)    BOTH EARS   Hyperlipidemia    no medication per pt.   Hypertension    pt denies says cardura for bladder   Neuropathy     LEFT HAND AND ARM NUMB ALL THE TIME   Overweight(278.02)     Past Surgical History:  Procedure Laterality Date   2 neck surgeries  2005-2006   Dr. Helane Gunther cx decompression, diskectomy,fusion C6-7 allograftWITH PLATE   APPENDECTOMY     EYE SURGERY     bilateral cataract surgerywith lens implant   FOOT SURGERY     right x 3   INNER EAR SURGERY Right    07/2020   LAPAROSCOPIC CHOLECYSTECTOMY  1993   Dr. Charlynne Pander   left total knee replacement   07/2007   Dr. Percell Miller    right total knee replacement  2012   Dr. Thelma Comp  1975, May 2013   x3   TONSILLECTOMY  as child   TOTAL HIP ARTHROPLASTY Left 04/18/2018   Procedure: LEFT TOTAL HIP ARTHROPLASTY ANTERIOR APPROACH;  Surgeon: Gaynelle Arabian, MD;  Location: [facility];  Service: Orthopedics;  Laterality: Left;   TOTAL HIP ARTHROPLASTY Right 03/13/2019   Procedure: TOTAL HIP ARTHROPLASTY ANTERIOR APPROACH;  Surgeon: Gaynelle Arabian, MD;  Location: [facility];  Service: Orthopedics;  Laterality: Right;  167min   TOTAL KNEE REVISION Left 11/11/2015   Procedure: LEFT KNEE POLYETHELENE REVISION;  Surgeon: Gaynelle Arabian, MD;  Location: [facility];  Service: Orthopedics;  Laterality: Left;   TOTAL KNEE REVISION Left 12/07/2016   Procedure: LEFT TOTAL KNEE REVISION;  Surgeon: Gaynelle Arabian, MD;  Location: [facility];  Service: Orthopedics;  Laterality: Left;  Adductor Block    There were no vitals filed for this visit.   Subjective Assessment - 06/07/21 1526     Subjective Doing so-so.Noticed some double vision when  doing his exercises.  Curious about crystals as the cause of his dizziness.    Pertinent History B HOH, HLD, HTN, neuropathy, neck surgeries 2005 & 2006, R foot surgery x3, B TKA with L knee revision x2, B THA    Diagnostic tests none recent    Patient Stated Goals improve dizziness    Currently in Pain? No/denies                [facility] PT Assessment - 06/07/21 0001       Standardized Balance Assessment   Standardized Balance Assessment Dynamic Gait Index      Dynamic Gait Index   Level Surface Mild Impairment    Change in Gait Speed Moderate Impairment    Gait with Horizontal Head Turns Mild Impairment    Gait with Vertical Head Turns Normal    Gait and Pivot Turn Normal    Step Over Obstacle Normal    Step Around Obstacles Normal    Steps Mild Impairment    Total Score 19                            Vestibular Treatment/Exercise - 06/07/21  0001       Vestibular Treatment/Exercise   Gaze Exercises X1 Viewing Horizontal;X1 Viewing Vertical      X1 Viewing Horizontal   Foot Position sitting, standing    Reps --   30" x3   Comments limited gaze stability with L head turn and c/o diplopia toward end of set      X1 Viewing Vertical   Foot Position sititng, standing    Reps --   30" x 2   Comments good stability; c/o neck crepitus                Balance Exercises - 06/07/21 0001       Balance Exercises: Standing   Standing Eyes Closed Wide (BOA);2 reps;30 secs;Limitations    Standing Eyes Closed Limitations 2nd set with feet closer together    Turning Right;Left;10 reps;Limitations   c/o mild dizziness and imbalance with L turn   Other Standing Exercises standing wide + EC head turns 30"    Other Standing Exercises Comments wide stance on foam with EC 2x30"   mild sway               PT Education - 06/07/21 1611     Education Details review and update to HEP-Access Code: [email or web address]; edu on importance of HEP compliance for max benefit    Person(s) Educated Patient    Methods Explanation;Demonstration;Tactile cues;Verbal cues;Handout    Comprehension Verbalized understanding;Returned demonstration              PT Short Term Goals - 06/07/21 1615       PT SHORT TERM GOAL #1   Title Patient to be independent with initial HEP.    Time 2    Period Weeks    Status Achieved    Target Date 06/09/21               PT Long Term Goals - 06/07/21 1615       PT LONG TERM GOAL #1   Title Patient to be independent with advanced HEP.    Time 6    Period Weeks    Status On-going    Target Date 07/07/21      PT LONG TERM GOAL #2   Title Patient to report 70% improvement  in balance when moving/walking.    Time 6    Period Weeks    Status On-going    Target Date 07/07/21      PT LONG TERM GOAL #3   Title Patient to demonstrate good gaze stability with head turns during ambulation while  maintaining good stability with LRAD.    Time 6    Period Weeks    Status On-going    Target Date 07/07/21      PT LONG TERM GOAL #4   Title Patient to score at least 20/24 on DGI in order to decrease risk of falls.    Time 6    Period Weeks    Status On-going    Target Date 07/07/21                   Plan - 06/07/21 1612     Clinical Impression Statement Patient arrived to session with report of diplopia with HEP. Educated patient on etiology of BPPV and reassured him that this was cleared out at last appointment. Reviewed HEP and provided cues to correct ROM, speed, and coordination of movement with VOR. More difficulty with horizontal vs. vertical VOR, particularly to L. Patient also with 1 episode of mild dizziness and imbalance with L turns. Patient scored 19/24 on DGI, indicating and increased risk of falls. Able to progress static balance to foam with mild sway. Patient tolerated session well and without complaints at end of session.    Personal Factors and Comorbidities Age;Comorbidity 3+;Time since onset of injury/illness/exacerbation;Past/Current Experience    Comorbidities B HOH, HLD, HTN, neuropathy, neck surgeries 2005 & 2006, R foot surgery x3, B TKA with L knee revision x2, B THA    Examination-Activity Limitations Locomotion Level;Transfers;Reach Overhead;Bend;Caring for Others;Lift;Stand;Stairs    Examination-Participation Restrictions Church;Meal Prep;Cleaning;Community Activity;Driving;Shop;Laundry    Stability/Clinical Decision Making Evolving/Moderate complexity    Rehab Potential Good    PT Frequency 1x / week    PT Duration 6 weeks    PT Treatment/Interventions ADLs/Self Care Home Management;Canalith Repostioning;Cryotherapy;DME Instruction;Moist Heat;Gait training;Stair training;Functional mobility training;Therapeutic activities;Therapeutic exercise;Balance training;Neuromuscular re-education;Manual techniques;Vestibular    PT Next Visit Plan progress VOR  in standing/with gait and balance    Consulted and Agree with Plan of Care Patient             Patient will benefit from skilled therapeutic intervention in order to improve the following deficits and impairments:  Abnormal gait, Decreased coordination, Dizziness, Decreased activity tolerance, Decreased balance  Visit Diagnosis: Dizziness and giddiness  Unsteadiness on feet     Problem List Patient Active Problem List   Diagnosis Date Noted   Sensorineural hearing loss (SNHL), bilateral 12/04/2019   OA (osteoarthritis) of hip 04/18/2018   Acute suppurative otitis media of left ear without spontaneous rupture of tympanic membrane 03/06/2018   Aftercare 12/15/2017   Acute serous otitis media of left ear 06/20/2017   Conductive hearing loss of both ears 06/20/2017   History of total bilateral knee replacement 06/18/2017   History of total knee replacement, right 06/13/2017   Pain in left knee 05/31/2017   OA (osteoarthritis) of knee 12/07/2016   Failed total knee arthroplasty ([facility]) 11/11/2015   Failed total knee, left ([facility]) 11/11/2015   Eustachian tube dysfunction, bilateral 07/15/2015   Suppurative otitis media of right ear without rupture of tympanic membrane 07/15/2015   Obesity (BMI 30-39.9) 02/17/2014   Seborrheic keratoses 02/08/2013   Paresthesia 02/09/2011   OVERWEIGHT 07/05/2008   HIATAL HERNIA 09/25/2007  Benign prostatic hyperplasia 07/14/2007   ISCHEMIC COLITIS 07/12/2007   HYPERLIPIDEMIA 05/07/2007   ANXIETY 05/07/2007   NEUROPATHY 05/07/2007   Essential hypertension 05/07/2007   ALLERGIC RHINITIS 05/07/2007   GERD 05/07/2007   BARRETTS ESOPHAGUS 05/07/2007   Osteoarthritis 05/07/2007   Janene Harvey, PT, DPT 06/07/21 4:16 PM   [facility] [address] [place], [place], [address] Phone: [phone]   Fax:  [phone]  Name: Isaiah Wilson MRN: [id number] Date of Birth: 01-Dec-1934

## 2021-06-09 ENCOUNTER — Ambulatory Visit: Payer: Medicare PPO | Admitting: Podiatry

## 2021-06-14 ENCOUNTER — Other Ambulatory Visit: Payer: Self-pay

## 2021-06-14 ENCOUNTER — Ambulatory Visit: Payer: Medicare PPO | Admitting: Rehabilitative and Restorative Service Providers"

## 2021-06-14 DIAGNOSIS — R2681 Unsteadiness on feet: Secondary | ICD-10-CM

## 2021-06-14 DIAGNOSIS — J301 Allergic rhinitis due to pollen: Secondary | ICD-10-CM | POA: Diagnosis not present

## 2021-06-14 DIAGNOSIS — R42 Dizziness and giddiness: Secondary | ICD-10-CM | POA: Diagnosis not present

## 2021-06-14 NOTE — Patient Instructions (Signed)
Access Code: [email or web address] URL: [email or web address] Date: 06/14/2021 Prepared by: Margretta Ditty  Program Notes perform at a counter top for safety!   Exercises Standing Gaze Stabilization with Head Rotation - 1 x daily - 5 x weekly - 2 sets - 30 sec hold Standing Gaze Stabilization with Head Nod - 1 x daily - 5 x weekly - 2 sets - 30 sec hold 180 Degree Pivot Turn with Single Point Cane - 1 x daily - 5 x weekly - 2 sets - 10 reps Standing Balance with Eyes Closed - 1 x daily - 5 x weekly - 3 sets - 30 sec hold Sit to Stand with Armchair - 1 x daily - 7 x weekly - 1 sets - 10 reps

## 2021-06-14 NOTE — Therapy (Signed)
[facility] [address], [address] [place], [place], [address] Phone: [phone]   Fax:  [phone]  Physical Therapy Treatment  Patient Details  Name: Isaiah Wilson MRN: [id number] Date of Birth: 13-Jan-1935 Referring Provider (PT): Kristian Covey, MD   Encounter Date: 06/14/2021   PT End of Session - 06/14/21 1604     Visit Number 3    Number of Visits 7    Date for PT Re-Evaluation 07/07/21    Authorization Type [employer]    Authorization Time Period 6 visits from 12/28-02/08    Authorization - Visit Number 3    Authorization - Number of Visits 6    PT Start Time 1535    PT Stop Time 1618    PT Time Calculation (min) 43 min    Equipment Utilized During Treatment Gait belt    Activity Tolerance Patient tolerated treatment well    Behavior During Therapy [facility] for tasks assessed/performed             Past Medical History:  Diagnosis Date   Abnormal EKG    hx left anterior fasicular block on old ekg 2017   Allergic rhinitis    Allergy    Barrett's esophagus    Benign prostatic hypertrophy    Colitis, ischemic (HCC)    X 2   Complication of anesthesia    SLOW TO AWAKEN AFTER 1 SURGERY 20  YRS AGO   DJD (degenerative joint disease)    SPINE, AND OA   GERD (gastroesophageal reflux disease)    H/O hiatal hernia    HOH (hard of hearing)    BOTH EARS   Hyperlipidemia    no medication per pt.   Hypertension    pt denies says cardura for bladder   Neuropathy     LEFT HAND AND ARM NUMB ALL THE TIME   Overweight(278.02)     Past Surgical History:  Procedure Laterality Date   2 neck surgeries  2005-2006   Dr. Georgia Dom cx decompression, diskectomy,fusion C6-7 allograftWITH PLATE   APPENDECTOMY     EYE SURGERY     bilateral cataract surgerywith lens implant   FOOT SURGERY     right x 3   INNER EAR SURGERY Right    07/2020   LAPAROSCOPIC CHOLECYSTECTOMY  1993   Dr. Luan Moore   left total knee replacement   07/2007   Dr. Eulah Pont    right total knee replacement  2012   Dr. Romie Jumper  1975, May 2013   x3   TONSILLECTOMY  as child   TOTAL HIP ARTHROPLASTY Left 04/18/2018   Procedure: LEFT TOTAL HIP ARTHROPLASTY ANTERIOR APPROACH;  Surgeon: Ollen Gross, MD;  Location: [facility];  Service: Orthopedics;  Laterality: Left;   TOTAL HIP ARTHROPLASTY Right 03/13/2019   Procedure: TOTAL HIP ARTHROPLASTY ANTERIOR APPROACH;  Surgeon: Ollen Gross, MD;  Location: [facility];  Service: Orthopedics;  Laterality: Right;    TOTAL KNEE REVISION Left 11/11/2015   Procedure: LEFT KNEE POLYETHELENE REVISION;  Surgeon: Ollen Gross, MD;  Location: [facility];  Service: Orthopedics;  Laterality: Left;   TOTAL KNEE REVISION Left 12/07/2016   Procedure: LEFT TOTAL KNEE REVISION;  Surgeon: Ollen Gross, MD;  Location: [facility];  Service: Orthopedics;  Laterality: Left;  Adductor Block    There were no vitals filed for this visit.   Subjective Assessment - 06/14/21 1538     Subjective The patient notes with stress, he  is more dizzy.  He notes he is the sole caregiver for his wife x 3 years and he thinks this increases stress.  With therapy, he notes it's slightly improving.  He feels like he veers to the R side with walking.    Pertinent History B HOH, HLD, HTN, neuropathy, neck surgeries 2005 & 2006, R foot surgery x3, B TKA with L knee revision x2, B THA    Diagnostic tests none recent    Patient Stated Goals improve dizziness    Currently in Pain? Yes    Effect of Pain on Daily Activities L knee pain constant pain "I hurt all of the time" he notes it is due to scar tissue.  PT to monitor pain, but no goal.                [facility] PT Assessment - 06/14/21 1541       Assessment   Medical Diagnosis Chronic vertigo    Referring Provider (PT) [name], MD    Onset Date/Surgical Date 07/28/20                           [facility] Adult PT Treatment/Exercise - 06/14/21 1556        Ambulation/Gait   Ambulation/Gait Yes    Ambulation/Gait Assistance 6: Modified independent (Device/Increase time);4: Min guard    Ambulation Distance (Feet) 400 Feet   also multiple short duration walking tasks with dynamic balance   Assistive device None    Gait Comments Dynamic gait working on faster speed with CGA, backwards walking x 15 feet x 3 reps, walking with ball toss, horiz and vertical head turns with CGA, turning.      Neuro Re-ed    Neuro Re-ed Details  Side-stepping x 14 feet R and L sides without UE support-- does increase back discomfort. Foam standing with eyes open and eyes closed, eyes open + horiz and vertical head motion.  Alternating foot taps to 6" steps x 10 reps dec'ing UE support.  Step ups x 10 reps R and L to foam pad with CGA      Exercises   Exercises Shoulder;Other Exercises    Other Exercises  sit<>stand with eyes open and eyes closed x 5 reps without loss of balance with supervision; seated hamstring stretches      Shoulder Exercises: Seated   Other Seated Exercises seated horizontal shoulder abduction x 10 for postural strengthening (midline<>T)             Vestibular Treatment/Exercise - 06/14/21 1604       Vestibular Treatment/Exercise   Vestibular Treatment Provided Habituation;Gaze    Habituation Exercises Standing Horizontal Head Turns;Standing Vertical Head Turns;180 degree Turns    Gaze Exercises X1 Viewing Horizontal;X1 Viewing Vertical      Standing Horizontal Head Turns   Number of Reps  10    Symptom Description  while standing on foam      Standing Vertical Head Turns   Number of Reps  10    Symptom Description  while standing on foam      180 degree Turns   Number of Reps  3    Symptom Description  increase in unstaediness and sensation of dizziness      X1 Viewing Horizontal   Foot Position standing    Comments patient initially uses entire body for rotation movement-- he needs cues to hold body still and only move  his head;  X1 Viewing Vertical   Foot Position standing                    PT Education - 06/14/21 1633     Education Details updated by adding sit<.stand for LE power/strength: [id number]    Person(s) Educated Patient    Methods Explanation;Demonstration;Handout    Comprehension Verbalized understanding;Returned demonstration              PT Short Term Goals - 06/07/21 1615       PT SHORT TERM GOAL #1   Title Patient to be independent with initial HEP.    Time 2    Period Weeks    Status Achieved    Target Date 06/09/21               PT Long Term Goals - 06/07/21 1615       PT LONG TERM GOAL #1   Title Patient to be independent with advanced HEP.    Time 6    Period Weeks    Status On-going    Target Date 07/07/21      PT LONG TERM GOAL #2   Title Patient to report 70% improvement in balance when moving/walking.    Time 6    Period Weeks    Status On-going    Target Date 07/07/21      PT LONG TERM GOAL #3   Title Patient to demonstrate good gaze stability with head turns during ambulation while maintaining good stability with LRAD.    Time 6    Period Weeks    Status On-going    Target Date 07/07/21      PT LONG TERM GOAL #4   Title Patient to score at least 20/24 on DGI in order to decrease risk of falls.    Time 6    Period Weeks    Status On-going    Target Date 07/07/21                   Plan - 06/14/21 1636     Clinical Impression Statement The patient has multi-factorial balance deficits with h/o multiple orthopedic surgeries/tightness and multi-sensory balance impairment.  PT to continue to progressing to patient tolerance working to [employer].    PT Treatment/Interventions ADLs/Self Care Home Management;Canalith Repostioning;Cryotherapy;DME Instruction;Moist Heat;Gait training;Stair training;Functional mobility training;Therapeutic activities;Therapeutic exercise;Balance training;Neuromuscular re-education;Manual  techniques;Vestibular    PT Next Visit Plan progress VOR in standing/with gait and balance    Consulted and Agree with Plan of Care Patient             Patient will benefit from skilled therapeutic intervention in order to improve the following deficits and impairments:     Visit Diagnosis: Dizziness and giddiness  Unsteadiness on feet     Problem List Patient Active Problem List   Diagnosis Date Noted   Sensorineural hearing loss (SNHL), bilateral 12/04/2019   OA (osteoarthritis) of hip 04/18/2018   Acute suppurative otitis media of left ear without spontaneous rupture of tympanic membrane 03/06/2018   Aftercare 12/15/2017   Acute serous otitis media of left ear 06/20/2017   Conductive hearing loss of both ears 06/20/2017   History of total bilateral knee replacement 06/18/2017   History of total knee replacement, right 06/13/2017   Pain in left knee 05/31/2017   OA (osteoarthritis) of knee 12/07/2016   Failed total knee arthroplasty ([place]) 11/11/2015   Failed total knee, left ([place]) 11/11/2015   Eustachian tube dysfunction, bilateral 07/15/2015  Suppurative otitis media of right ear without rupture of tympanic membrane 07/15/2015   Obesity (BMI 30-39.9) 02/17/2014   Seborrheic keratoses 02/08/2013   Paresthesia 02/09/2011   OVERWEIGHT 07/05/2008   HIATAL HERNIA 09/25/2007   Benign prostatic hyperplasia 07/14/2007   ISCHEMIC COLITIS 07/12/2007   HYPERLIPIDEMIA 05/07/2007   ANXIETY 05/07/2007   NEUROPATHY 05/07/2007   Essential hypertension 05/07/2007   ALLERGIC RHINITIS 05/07/2007   GERD 05/07/2007   BARRETTS ESOPHAGUS 05/07/2007   Osteoarthritis 05/07/2007    Roslyn Else, PT 06/14/2021, 4:37 PM  [facility] [address] [place], [place], [address] Phone: [phone]   Fax:  [phone]  Name: Isaiah Wilson MRN: [id number] Date of Birth: 04-16-1935

## 2021-06-15 ENCOUNTER — Telehealth: Payer: Self-pay | Admitting: Internal Medicine

## 2021-06-15 NOTE — Telephone Encounter (Signed)
Prudence from [facility] called regarding prior authorization for patient's Nexium, 40 mg caps.  She states she faxed over a form that needs to be sent back asap as patient is running out of his medications soon.  Her fax-back number is [phone].  Thank you.

## 2021-06-17 ENCOUNTER — Telehealth: Payer: Self-pay

## 2021-06-17 MED ORDER — ESOMEPRAZOLE MAGNESIUM 40 MG PO CPDR: DELAYED_RELEASE_CAPSULE | ORAL | 0 refills | Status: DC

## 2021-06-17 NOTE — Telephone Encounter (Signed)
PA for Nexium approved. Resent rx with this information

## 2021-06-17 NOTE — Telephone Encounter (Signed)
Completed the PA through [facility] over the phone.

## 2021-06-21 ENCOUNTER — Encounter: Payer: Self-pay | Admitting: Physical Therapy

## 2021-06-21 ENCOUNTER — Other Ambulatory Visit: Payer: Self-pay

## 2021-06-21 ENCOUNTER — Ambulatory Visit: Payer: Medicare PPO | Admitting: Podiatry

## 2021-06-21 ENCOUNTER — Ambulatory Visit: Payer: Medicare PPO | Admitting: Physical Therapy

## 2021-06-21 ENCOUNTER — Encounter: Payer: Self-pay | Admitting: Podiatry

## 2021-06-21 DIAGNOSIS — R2681 Unsteadiness on feet: Secondary | ICD-10-CM

## 2021-06-21 DIAGNOSIS — R42 Dizziness and giddiness: Secondary | ICD-10-CM

## 2021-06-21 DIAGNOSIS — D689 Coagulation defect, unspecified: Secondary | ICD-10-CM

## 2021-06-21 DIAGNOSIS — M79675 Pain in left toe(s): Secondary | ICD-10-CM | POA: Diagnosis not present

## 2021-06-21 DIAGNOSIS — L84 Corns and callosities: Secondary | ICD-10-CM

## 2021-06-21 DIAGNOSIS — B351 Tinea unguium: Secondary | ICD-10-CM | POA: Diagnosis not present

## 2021-06-21 DIAGNOSIS — M79674 Pain in right toe(s): Secondary | ICD-10-CM | POA: Diagnosis not present

## 2021-06-21 NOTE — Therapy (Signed)
[facility] [address] [place], [place], [place], [address] Phone: [phone]   Fax:  [phone]  Physical Therapy Treatment  Patient Details  Name: Isaiah Wilson MRN: [id number] Date of Birth: January 31, 1935 Referring Provider (PT): [name], MD   Encounter Date: 06/21/2021   PT End of Session - 06/21/21 1612     Visit Number 4    Number of Visits 7    Date for PT Re-Evaluation 07/07/21    Authorization Type [employer]    Authorization Time Period 6 visits from 12/28-02/08    Authorization - Visit Number 4    Authorization - Number of Visits 6    PT Start Time 1529    PT Stop Time 1610    PT Time Calculation (min) 41 min    Equipment Utilized During Treatment Gait belt    Activity Tolerance Patient tolerated treatment well    Behavior During Therapy [facility] for tasks assessed/performed             Past Medical History:  Diagnosis Date   Abnormal EKG    hx left anterior fasicular block on old ekg 2017   Allergic rhinitis    Allergy    Barrett's esophagus    Benign prostatic hypertrophy    Colitis, ischemic (HCC)    X 2   Complication of anesthesia    SLOW TO AWAKEN AFTER 1 SURGERY 20  YRS AGO   DJD (degenerative joint disease)    SPINE, AND OA   GERD (gastroesophageal reflux disease)    H/O hiatal hernia    HOH (hard of hearing)    BOTH EARS   Hyperlipidemia    no medication per pt.   Hypertension    pt denies says cardura for bladder   Neuropathy     LEFT HAND AND ARM NUMB ALL THE TIME   Overweight(278.02)     Past Surgical History:  Procedure Laterality Date   2 neck surgeries  2005-2006   Dr. Helane Gunther cx decompression, diskectomy,fusion C6-7 allograftWITH PLATE   APPENDECTOMY     EYE SURGERY     bilateral cataract surgerywith lens implant   FOOT SURGERY     right x 3   INNER EAR SURGERY Right    07/2020   LAPAROSCOPIC CHOLECYSTECTOMY  1993   Dr. Charlynne Pander   left total knee replacement   07/2007   Dr. Percell Miller    right total knee replacement  2012   Dr. Thelma Comp  1975, May 2013   x3   TONSILLECTOMY  as child   TOTAL HIP ARTHROPLASTY Left 04/18/2018   Procedure: LEFT TOTAL HIP ARTHROPLASTY ANTERIOR APPROACH;  Surgeon: Gaynelle Arabian, MD;  [facility]: [facility];  Service: Orthopedics;  Laterality: Left;   TOTAL HIP ARTHROPLASTY Right 03/13/2019   Procedure: TOTAL HIP ARTHROPLASTY ANTERIOR APPROACH;  Surgeon: Gaynelle Arabian, MD;  Location: [facility];  Service: Orthopedics;  Laterality: Right;  131min   TOTAL KNEE REVISION Left 11/11/2015   Procedure: LEFT KNEE POLYETHELENE REVISION;  Surgeon: Gaynelle Arabian, MD;  Location: [facility];  Service: Orthopedics;  Laterality: Left;   TOTAL KNEE REVISION Left 12/07/2016   Procedure: LEFT TOTAL KNEE REVISION;  Surgeon: Gaynelle Arabian, MD;  Location: [facility];  Service: Orthopedics;  Laterality: Left;  Adductor Block    There were no vitals filed for this visit.   Subjective Assessment - 06/21/21 1530     Subjective Had a bad day Sunday when  he was pretty dizzy. Currently dizzy and notes that he doesn't get a lot of rest d/t caring for his wife.    Pertinent History B HOH, HLD, HTN, neuropathy, neck surgeries 2005 & 2006, R foot surgery x3, B TKA with L knee revision x2, B THA    Diagnostic tests none recent    Patient Stated Goals improve dizziness    Currently in Pain? No/denies                               [facility] Adult PT Treatment/Exercise - 06/21/21 0001       Neuro Re-ed    Neuro Re-ed Details  STS 10x, 10x on foam without UE use; R/L D2 flexion with red medball to cone on stool 10x- c/o slightly more dizziness with R flexion; R/L forward/back stepping with head turns to targets 10x each;   1 episode of imbalance requiring min A            Vestibular Treatment/Exercise - 06/21/21 0001       X1 Viewing Horizontal   Foot Position sitting, standing    Reps --   30"   Comments cues to limit  cervical ROM for improved gaze stability      X1 Viewing Vertical   Foot Position sitting, standing    Reps --   30"   Comments c/o 1 episode of diplopia                Balance Exercises - 06/21/21 0001       Balance Exercises: Standing   Standing Eyes Opened Foam/compliant surface;2 reps;Wide (BOA);Narrow base of support (BOS);30 secs;Limitations    Standing Eyes Opened Limitations 2nd set with perturbations    Standing Eyes Closed Wide (BOA);2 reps;30 secs;Limitations    Standing Eyes Closed Limitations 2nd set with feet closer together    Turning Right;Left;10 reps;Limitations   R/L turning to targets + 3 alt toe taps on cone with CGA   Turning Limitations CGA; no c/o dizziness    Other Standing Exercises Comments wide stance EC head turnsnods 2x30"                PT Education - 06/21/21 1612     Education Details update to [id number]    Person(s) Educated Patient    Methods Explanation;Demonstration;Tactile cues;Verbal cues;Handout    Comprehension Returned demonstration;Verbalized understanding              PT Short Term Goals - 06/07/21 1615       PT SHORT TERM GOAL #1   Title Patient to be independent with initial HEP.    Time 2    Period Weeks    Status Achieved    Target Date 06/09/21               PT Long Term Goals - 06/07/21 1615       PT LONG TERM GOAL #1   Title Patient to be independent with advanced HEP.    Time 6    Period Weeks    Status On-going    Target Date 07/07/21      PT LONG TERM GOAL #2   Title Patient to report 70% improvement in balance when moving/walking.    Time 6    Period Weeks    Status On-going    Target Date 07/07/21      PT LONG TERM GOAL #3   Title Patient  to demonstrate good gaze stability with head turns during ambulation while maintaining good stability with LRAD.    Time 6    Period Weeks    Status On-going    Target Date 07/07/21      PT LONG TERM GOAL #4   Title Patient to score at  least 20/24 on DGI in order to decrease risk of falls.    Time 6    Period Weeks    Status On-going    Target Date 07/07/21                   Plan - 06/21/21 1613     Clinical Impression Statement Patient arrived to session with report of increased dizziness recently. VOR still requires cues to limit ROM for improved gaze stability. Static balance with EC and on foam has improved. Patient with most challenge with EC. Mild-moderate instability evident with addition of head turns/nods. Dynamic balance activities with head turns and gaze stability required CGA d/t instability. Patient tolerated session well and without complaints at end of session.    Comorbidities B HOH, HLD, HTN, neuropathy, neck surgeries 2005 & 2006, R foot surgery x3, B TKA with L knee revision x2, B THA    PT Treatment/Interventions ADLs/Self Care Home Management;Canalith Repostioning;Cryotherapy;DME Instruction;Moist Heat;Gait training;Stair training;Functional mobility training;Therapeutic activities;Therapeutic exercise;Balance training;Neuromuscular re-education;Manual techniques;Vestibular    PT Next Visit Plan progress VOR in standing/with gait and balance    Consulted and Agree with Plan of Care Patient             Patient will benefit from skilled therapeutic intervention in order to improve the following deficits and impairments:  Abnormal gait, Decreased coordination, Dizziness, Decreased activity tolerance, Decreased balance  Visit Diagnosis: Dizziness and giddiness  Unsteadiness on feet     Problem List Patient Active Problem List   Diagnosis Date Noted   Sensorineural hearing loss (SNHL), bilateral 12/04/2019   OA (osteoarthritis) of hip 04/18/2018   Acute suppurative otitis media of left ear without spontaneous rupture of tympanic membrane 03/06/2018   Aftercare 12/15/2017   Acute serous otitis media of left ear 06/20/2017   Conductive hearing loss of both ears 06/20/2017   History  of total bilateral knee replacement 06/18/2017   History of total knee replacement, right 06/13/2017   Pain in left knee 05/31/2017   OA (osteoarthritis) of knee 12/07/2016   Failed total knee arthroplasty ([place]) 11/11/2015   Failed total knee, left ([facility]) 11/11/2015   Eustachian tube dysfunction, bilateral 07/15/2015   Suppurative otitis media of right ear without rupture of tympanic membrane 07/15/2015   Obesity (BMI 30-39.9) 02/17/2014   Seborrheic keratoses 02/08/2013   Paresthesia 02/09/2011   OVERWEIGHT 07/05/2008   HIATAL HERNIA 09/25/2007   Benign prostatic hyperplasia 07/14/2007   ISCHEMIC COLITIS 07/12/2007   HYPERLIPIDEMIA 05/07/2007   ANXIETY 05/07/2007   NEUROPATHY 05/07/2007   Essential hypertension 05/07/2007   ALLERGIC RHINITIS 05/07/2007   GERD 05/07/2007   BARRETTS ESOPHAGUS 05/07/2007   Osteoarthritis 05/07/2007   Janene Harvey, PT, DPT 06/21/21 4:14 PM   [facility] [address], [place], [place], [address] Phone: [phone]   Fax:  [phone]  Name: Isaiah Wilson MRN: [id number] Date of Birth: 07/30/1934

## 2021-06-23 NOTE — Progress Notes (Signed)
Subjective:   Patient ID: Isaiah Wilson, male   DOB: 87 y.o.   MRN: [id number]   HPI Patient presents with chronic callus formation subfirst metatarsal bilateral and nail disease bilateral that gets thick and hard for him to cut   ROS      Objective:  Physical Exam  Neurovascular status unchanged with patient found to have at risk bleeding disorder and on anticoagulant therapy with lesion formation subfirst metatarsal and also nail disease 1-5 both feet thick     Assessment:  At risk patient with mycotic nail infection lesion formation bilateral     Plan:  Debridement of nailbeds and debridement of lesions no iatrogenic bleeding reappoint routine care

## 2021-06-28 ENCOUNTER — Encounter: Payer: Self-pay | Admitting: Physical Therapy

## 2021-06-28 ENCOUNTER — Ambulatory Visit: Payer: Medicare PPO | Admitting: Physical Therapy

## 2021-06-28 ENCOUNTER — Other Ambulatory Visit: Payer: Self-pay

## 2021-06-28 DIAGNOSIS — R42 Dizziness and giddiness: Secondary | ICD-10-CM | POA: Diagnosis not present

## 2021-06-28 DIAGNOSIS — J301 Allergic rhinitis due to pollen: Secondary | ICD-10-CM | POA: Diagnosis not present

## 2021-06-28 DIAGNOSIS — R2681 Unsteadiness on feet: Secondary | ICD-10-CM

## 2021-06-28 NOTE — Therapy (Signed)
[facility] [address], [place], [place], [address] Phone: [phone]   Fax:  [phone]  Physical Therapy Discharge Summary  Patient Details  Name: Isaiah Wilson MRN: [id number] Date of Birth: 11/04/1934 Referring Provider (PT): [name], MD  Progress Note Reporting Period 05/26/21 to 06/28/21  See note below for Objective Data and Assessment of Progress/Goals.    Encounter Date: 06/28/2021   PT End of Session - 06/28/21 1609     Visit Number 5    Number of Visits 7    Date for PT Re-Evaluation 07/07/21    Authorization Type [employer]    Authorization Time Period 6 visits from 12/28-02/08    Authorization - Visit Number 5    Authorization - Number of Visits 6    PT Start Time 3335    PT Stop Time 1601    PT Time Calculation (min) 38 min    Equipment Utilized During Treatment Gait belt    Activity Tolerance Patient tolerated treatment well    Behavior During Therapy WFL for tasks assessed/performed             Past Medical History:  Diagnosis Date   Abnormal EKG    hx left anterior fasicular block on old ekg 2017   Allergic rhinitis    Allergy    Barrett's esophagus    Benign prostatic hypertrophy    Colitis, ischemic (HCC)    X 2   Complication of anesthesia    SLOW TO AWAKEN AFTER 1 SURGERY 20  YRS AGO   DJD (degenerative joint disease)    SPINE, AND OA   GERD (gastroesophageal reflux disease)    H/O hiatal hernia    HOH (hard of hearing)    BOTH EARS   Hyperlipidemia    no medication per pt.   Hypertension    pt denies says cardura for bladder   Neuropathy     LEFT HAND AND ARM NUMB ALL THE TIME   Overweight(278.02)     Past Surgical History:  Procedure Laterality Date   2 neck surgeries  2005-2006   Dr. Helane Gunther cx decompression, diskectomy,fusion C6-7 allograftWITH PLATE   APPENDECTOMY     EYE SURGERY     bilateral cataract surgerywith lens implant   FOOT SURGERY      right x 3   INNER EAR SURGERY Right    07/2020   LAPAROSCOPIC CHOLECYSTECTOMY  1993   Dr. Charlynne Pander   left total knee replacement  07/2007   Dr. Percell Miller    right total knee replacement  2012   Dr. Thelma Comp  1975, May 2013   x3   TONSILLECTOMY  as child   TOTAL HIP ARTHROPLASTY Left 04/18/2018   Procedure: LEFT TOTAL HIP ARTHROPLASTY ANTERIOR APPROACH;  Surgeon: Gaynelle Arabian, MD;  Location: [facility];  Service: Orthopedics;  Laterality: Left;   TOTAL HIP ARTHROPLASTY Right 03/13/2019   Procedure: TOTAL HIP ARTHROPLASTY ANTERIOR APPROACH;  Surgeon: Gaynelle Arabian, MD;  Location: [facility];  Service: Orthopedics;  Laterality: Right;  135min   TOTAL KNEE REVISION Left 11/11/2015   Procedure: LEFT KNEE POLYETHELENE REVISION;  Surgeon: Gaynelle Arabian, MD;  Location: [facility];  Service: Orthopedics;  Laterality: Left;   TOTAL KNEE REVISION Left 12/07/2016   Procedure: LEFT TOTAL KNEE REVISION;  Surgeon: Gaynelle Arabian, MD;  Location: [facility];  Service: Orthopedics;  Laterality: Left;  Adductor Block    There were no vitals filed  for this visit.   Subjective Assessment - 06/28/21 1522     Subjective Wondering if he can just work on his exercises at home. Notices that day after therapy he feels to dizzy that he can barely walk. Yesterday was bad too- don't know why. Feels that his dizzziness is better than when he began therapy on some days. Reports no improvement in balance.    Pertinent History B HOH, HLD, HTN, neuropathy, neck surgeries 2005 & 2006, R foot surgery x3, B TKA with L knee revision x2, B THA    Diagnostic tests none recent    Patient Stated Goals improve dizziness    Currently in Pain? No/denies                [facility] PT Assessment - 06/28/21 1527       Assessment   Medical Diagnosis Chronic vertigo    Referring Provider (PT) [name], MD    Onset Date/Surgical Date 07/28/20      Dynamic Gait Index   Level Surface Mild Impairment    Change in Gait  Speed Mild Impairment    Gait with Horizontal Head Turns Mild Impairment    Gait with Vertical Head Turns Mild Impairment    Gait and Pivot Turn Normal    Step Over Obstacle Normal    Step Around Obstacles Normal    Steps Normal    Total Score 20                           [facility] Adult PT Treatment/Exercise - 06/28/21 0001       Neuro Re-ed    Neuro Re-ed Details  R/L forward/back stepping + head nods to targets with CGA; gait + tossing yellow medball, gait + VOR with CGA   c/o mild dizziness and mild instability            Vestibular Treatment/Exercise - 06/28/21 0001       Standing Horizontal Head Turns   Number of Reps  10    Symptom Description  EC; mild-mod sway      Standing Vertical Head Turns   Number of Reps  10    Symptom Description  EC; mild-mod sway      X1 Viewing Horizontal   Foot Position standing    Reps --   30"   Comments good form; c/o mild dizziness      X1 Viewing Vertical   Foot Position standing    Reps --   30"   Comments good form                Balance Exercises - 06/28/21 0001       Balance Exercises: Standing   Standing Eyes Opened Narrow base of support (BOS);1 rep;30 secs    Standing Eyes Opened Limitations mild sway    Standing Eyes Closed Narrow base of support (BOS);Solid surface;1 rep;30 secs    Standing Eyes Closed Limitations moderate sway                PT Education - 06/28/21 1607     Education Details discussion on objective progress and remaining impairments; update/review of [id number]; advised patient to return to PCP if dizziness does not improve after 1-2 months of HEP compliance    Person(s) Educated Patient    Methods Explanation;Demonstration;Tactile cues;Verbal cues;Handout    Comprehension Returned demonstration;Verbalized understanding  PT Short Term Goals - 06/28/21 1602       PT SHORT TERM GOAL #1   Title Patient to be independent with initial HEP.     Time 2    Period Weeks    Status Achieved    Target Date 06/09/21               PT Long Term Goals - 06/28/21 1603       PT LONG TERM GOAL #1   Title Patient to be independent with advanced HEP.    Time 6    Period Weeks    Status Achieved    Target Date 07/07/21      PT LONG TERM GOAL #2   Title Patient to report 70% improvement in balance when moving/walking.    Time 6    Period Weeks    Status Not Met   reports no improvement   Target Date 07/07/21      PT LONG TERM GOAL #3   Title Patient to demonstrate good gaze stability with head turns during ambulation while maintaining good stability with LRAD.    Time 6    Period Weeks    Status Partially Met   improved in standing, however limited gaze stability when ambulating   Target Date 07/07/21      PT LONG TERM GOAL #4   Title Patient to score at least 20/24 on DGI in order to decrease risk of falls.    Time 6    Period Weeks    Status Achieved    Target Date 07/07/21                   Plan - 06/28/21 1606     Clinical Impression Statement Patient arrived to session with report of increased dizziness after last PT session and asking to wrap up with PT at this time. Patient notes no improvement in balance at this time. Patient scored 20/24 on DGI, indicating a decreased risk of falls. Still with imbalance with head turns and nods with gait. VOR appears to have improved when patient is in static standing, however with limited gaze stability when trying to perform VOR exercises while ambulating. Worked on VOR exercises, gait with balance challenges, and both static and dynamic balance activities with CGA for safety. Updated HEP to address remaining balance deficits and advised patient to perform these activities at counter top for safety. Patient reported understanding and without complaints at end of session. Patient has demonstrated limited progress with therapy at this time. Now D/Cd with transition to HEP.     Comorbidities B HOH, HLD, HTN, neuropathy, neck surgeries 2005 & 2006, R foot surgery x3, B TKA with L knee revision x2, B THA    PT Treatment/Interventions ADLs/Self Care Home Management;Canalith Repostioning;Cryotherapy;DME Instruction;Moist Heat;Gait training;Stair training;Functional mobility training;Therapeutic activities;Therapeutic exercise;Balance training;Neuromuscular re-education;Manual techniques;Vestibular    PT Next Visit Plan DC at this time    Consulted and Agree with Plan of Care Patient             Patient will benefit from skilled therapeutic intervention in order to improve the following deficits and impairments:  Abnormal gait, Decreased coordination, Dizziness, Decreased activity tolerance, Decreased balance  Visit Diagnosis: Dizziness and giddiness  Unsteadiness on feet     Problem List Patient Active Problem List   Diagnosis Date Noted   Sensorineural hearing loss (SNHL), bilateral 12/04/2019   OA (osteoarthritis) of hip 04/18/2018   Acute suppurative otitis media of left  ear without spontaneous rupture of tympanic membrane 03/06/2018   Aftercare 12/15/2017   Acute serous otitis media of left ear 06/20/2017   Conductive hearing loss of both ears 06/20/2017   History of total bilateral knee replacement 06/18/2017   History of total knee replacement, right 06/13/2017   Pain in left knee 05/31/2017   OA (osteoarthritis) of knee 12/07/2016   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   Failed total knee, 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/11/2015   Eustachian tube dysfunction, bilateral 07/15/2015   Suppurative otitis media of right ear without rupture of tympanic membrane 07/15/2015   Obesity (BMI 30-39.9) 02/17/2014   Seborrheic keratoses 02/08/2013   Paresthesia 02/09/2011   OVERWEIGHT 07/05/2008   HIATAL HERNIA 09/25/2007   Benign prostatic hyperplasia 07/14/2007   ISCHEMIC COLITIS 07/12/2007   HYPERLIPIDEMIA 05/07/2007   ANXIETY 05/07/2007   NEUROPATHY  05/07/2007   Essential hypertension 05/07/2007   ALLERGIC RHINITIS 05/07/2007   GERD 05/07/2007   BARRETTS ESOPHAGUS 05/07/2007   Osteoarthritis 05/07/2007    PHYSICAL THERAPY DISCHARGE SUMMARY  Visits from Start of Care: 5  Current functional level related to goals / functional outcomes: See above clinical impression   Remaining deficits: Imbalance, limited VOR   Education / Equipment: HEP  Plan: Patient agrees to discharge.  Patient goals were partially met. Patient is being discharged per his request.    Janene Harvey, PT, DPT 06/28/21 4:11 PM   [facility] [address] [place], [place], [address] Phone: [phone]   Fax:  [phone]  Name: Isaiah Wilson MRN: [id number] Date of Birth: 07-01-1934

## 2021-07-09 ENCOUNTER — Other Ambulatory Visit: Payer: Self-pay | Admitting: Family Medicine

## 2021-07-21 DIAGNOSIS — J301 Allergic rhinitis due to pollen: Secondary | ICD-10-CM | POA: Diagnosis not present

## 2021-07-28 DIAGNOSIS — J301 Allergic rhinitis due to pollen: Secondary | ICD-10-CM | POA: Diagnosis not present

## 2021-07-28 DIAGNOSIS — J3081 Allergic rhinitis due to animal (cat) (dog) hair and dander: Secondary | ICD-10-CM | POA: Diagnosis not present

## 2021-07-28 DIAGNOSIS — J3089 Other allergic rhinitis: Secondary | ICD-10-CM | POA: Diagnosis not present

## 2021-08-03 DIAGNOSIS — J301 Allergic rhinitis due to pollen: Secondary | ICD-10-CM | POA: Diagnosis not present

## 2021-08-08 ENCOUNTER — Other Ambulatory Visit: Payer: Self-pay | Admitting: Family Medicine

## 2021-08-20 DIAGNOSIS — J301 Allergic rhinitis due to pollen: Secondary | ICD-10-CM | POA: Diagnosis not present

## 2021-08-23 ENCOUNTER — Encounter: Payer: Self-pay | Admitting: Podiatry

## 2021-08-23 ENCOUNTER — Ambulatory Visit: Payer: Medicare PPO | Admitting: Podiatry

## 2021-08-23 ENCOUNTER — Other Ambulatory Visit: Payer: Self-pay

## 2021-08-23 DIAGNOSIS — D689 Coagulation defect, unspecified: Secondary | ICD-10-CM | POA: Diagnosis not present

## 2021-08-23 DIAGNOSIS — L84 Corns and callosities: Secondary | ICD-10-CM | POA: Diagnosis not present

## 2021-08-23 DIAGNOSIS — M79675 Pain in left toe(s): Secondary | ICD-10-CM | POA: Diagnosis not present

## 2021-08-23 DIAGNOSIS — B351 Tinea unguium: Secondary | ICD-10-CM

## 2021-08-23 DIAGNOSIS — M79674 Pain in right toe(s): Secondary | ICD-10-CM | POA: Diagnosis not present

## 2021-08-23 NOTE — Progress Notes (Signed)
Subjective:  ? ?Patient ID: Isaiah Wilson, male   DOB: 87 y.o.   MRN: [id number]  ? ?HPI ?Patient presents stating he has developed painful lesions on the bottom of both feet and thick yellow nailbeds 1 through 5 both feet that have been painful and make shoe gear difficult and he cannot take care of ? ? ?ROS ? ? ?   ?Objective:  ?Physical Exam  ?Keratotic lesions of the first metatarsal head bilateral painful and thick yellow brittle toenails with pain 1-5 both feet ? ?   ?Assessment:  ?Mycotic nail infection with pain 1-5 both feet and keratotic plantar lesion formation first metatarsal both feet ? ?   ?Plan:  ?Debride nailbeds 1 through 5 both feet with no iatrogenic bleeding and lesions bilateral no iatrogenic bleeding which will be done on a routine basis ?   ? ? ?

## 2021-08-30 DIAGNOSIS — J301 Allergic rhinitis due to pollen: Secondary | ICD-10-CM | POA: Diagnosis not present

## 2021-08-31 ENCOUNTER — Other Ambulatory Visit: Payer: Self-pay | Admitting: Family Medicine

## 2021-09-01 DIAGNOSIS — J301 Allergic rhinitis due to pollen: Secondary | ICD-10-CM | POA: Diagnosis not present

## 2021-09-07 ENCOUNTER — Other Ambulatory Visit: Payer: Self-pay | Admitting: *Deleted

## 2021-09-07 DIAGNOSIS — I7121 Aneurysm of the ascending aorta, without rupture: Secondary | ICD-10-CM

## 2021-09-28 DIAGNOSIS — J3089 Other allergic rhinitis: Secondary | ICD-10-CM | POA: Diagnosis not present

## 2021-09-28 DIAGNOSIS — J3081 Allergic rhinitis due to animal (cat) (dog) hair and dander: Secondary | ICD-10-CM | POA: Diagnosis not present

## 2021-09-28 DIAGNOSIS — J301 Allergic rhinitis due to pollen: Secondary | ICD-10-CM | POA: Diagnosis not present

## 2021-10-06 ENCOUNTER — Other Ambulatory Visit: Payer: Self-pay | Admitting: Family Medicine

## 2021-10-07 DIAGNOSIS — J3081 Allergic rhinitis due to animal (cat) (dog) hair and dander: Secondary | ICD-10-CM | POA: Diagnosis not present

## 2021-10-07 DIAGNOSIS — J301 Allergic rhinitis due to pollen: Secondary | ICD-10-CM | POA: Diagnosis not present

## 2021-10-07 DIAGNOSIS — J3089 Other allergic rhinitis: Secondary | ICD-10-CM | POA: Diagnosis not present

## 2021-10-08 ENCOUNTER — Ambulatory Visit
Admission: RE | Admit: 2021-10-08 | Discharge: 2021-10-08 | Disposition: A | Discharge status: Home / Self Care | Payer: Medicare PPO | Source: Ambulatory Visit | Attending: Cardiology | Admitting: Cardiology

## 2021-10-08 DIAGNOSIS — I712 Thoracic aortic aneurysm, without rupture, unspecified: Secondary | ICD-10-CM | POA: Diagnosis not present

## 2021-10-08 DIAGNOSIS — I700 Atherosclerosis of aorta: Secondary | ICD-10-CM | POA: Diagnosis not present

## 2021-10-08 DIAGNOSIS — I7121 Aneurysm of the ascending aorta, without rupture: Secondary | ICD-10-CM

## 2021-10-08 DIAGNOSIS — I2510 Atherosclerotic heart disease of native coronary artery without angina pectoris: Secondary | ICD-10-CM | POA: Diagnosis not present

## 2021-10-08 DIAGNOSIS — I517 Cardiomegaly: Secondary | ICD-10-CM | POA: Diagnosis not present

## 2021-10-08 MED ORDER — IOPAMIDOL (ISOVUE-370) INJECTION 76%
75.0000 mL | Freq: Once | INTRAVENOUS | Status: AC | PRN
  Administered 2021-10-08: 75 mL via INTRAVENOUS

## 2021-10-10 ENCOUNTER — Other Ambulatory Visit: Payer: Self-pay | Admitting: Family Medicine

## 2021-10-11 ENCOUNTER — Encounter: Payer: Self-pay | Admitting: *Deleted

## 2021-10-18 DIAGNOSIS — J3081 Allergic rhinitis due to animal (cat) (dog) hair and dander: Secondary | ICD-10-CM | POA: Diagnosis not present

## 2021-10-18 DIAGNOSIS — L218 Other seborrheic dermatitis: Secondary | ICD-10-CM | POA: Diagnosis not present

## 2021-10-18 DIAGNOSIS — J301 Allergic rhinitis due to pollen: Secondary | ICD-10-CM | POA: Diagnosis not present

## 2021-10-18 DIAGNOSIS — J3089 Other allergic rhinitis: Secondary | ICD-10-CM | POA: Diagnosis not present

## 2021-10-27 ENCOUNTER — Ambulatory Visit: Payer: Medicare PPO | Admitting: Podiatry

## 2021-10-27 ENCOUNTER — Encounter: Payer: Self-pay | Admitting: Podiatry

## 2021-10-27 DIAGNOSIS — L84 Corns and callosities: Secondary | ICD-10-CM

## 2021-10-27 DIAGNOSIS — M79675 Pain in left toe(s): Secondary | ICD-10-CM | POA: Diagnosis not present

## 2021-10-27 DIAGNOSIS — B351 Tinea unguium: Secondary | ICD-10-CM

## 2021-10-27 DIAGNOSIS — D689 Coagulation defect, unspecified: Secondary | ICD-10-CM

## 2021-10-27 DIAGNOSIS — M79674 Pain in right toe(s): Secondary | ICD-10-CM | POA: Diagnosis not present

## 2021-10-27 NOTE — Progress Notes (Signed)
Subjective:   Patient ID: Isaiah Wilson, male   DOB: 87 y.o.   MRN: [id number]   HPI Patient presents with nail disease 1-5 both feet that are painful when pressed and lesions underneath the first metatarsal head bilateral painful when pressed that he cannot take care of   ROS      Objective:  Physical Exam  Neurovascular status unchanged thick yellow brittle nailbeds 1-5 both feet painful when pressed with lesion formation subfirst metatarsal keratotic painful when pressed with patient on blood thinner     Assessment:  At risk patient with inflammatory keratotic lesion formation and thick yellow mycotic painful nailbeds 1-5 both feet     Plan:  Debridement of nailbeds 1-5 both feet debridement of lesions bilateral no iatrogenic bleeding reappoint routine care

## 2021-11-03 DIAGNOSIS — J301 Allergic rhinitis due to pollen: Secondary | ICD-10-CM | POA: Diagnosis not present

## 2021-11-03 DIAGNOSIS — J3089 Other allergic rhinitis: Secondary | ICD-10-CM | POA: Diagnosis not present

## 2021-11-03 DIAGNOSIS — J3081 Allergic rhinitis due to animal (cat) (dog) hair and dander: Secondary | ICD-10-CM | POA: Diagnosis not present

## 2021-11-12 DIAGNOSIS — Z4589 Encounter for adjustment and management of other implanted devices: Secondary | ICD-10-CM | POA: Diagnosis not present

## 2021-11-12 DIAGNOSIS — H6983 Other specified disorders of Eustachian tube, bilateral: Secondary | ICD-10-CM | POA: Diagnosis not present

## 2021-11-12 DIAGNOSIS — H6611 Chronic tubotympanic suppurative otitis media, right ear: Secondary | ICD-10-CM | POA: Diagnosis not present

## 2021-11-12 DIAGNOSIS — H6993 Unspecified Eustachian tube disorder, bilateral: Secondary | ICD-10-CM | POA: Diagnosis not present

## 2021-11-12 DIAGNOSIS — H6641 Suppurative otitis media, unspecified, right ear: Secondary | ICD-10-CM | POA: Diagnosis not present

## 2021-11-12 DIAGNOSIS — H90A31 Mixed conductive and sensorineural hearing loss, unilateral, right ear with restricted hearing on the contralateral side: Secondary | ICD-10-CM | POA: Diagnosis not present

## 2021-11-12 DIAGNOSIS — Z9089 Acquired absence of other organs: Secondary | ICD-10-CM | POA: Diagnosis not present

## 2021-11-12 DIAGNOSIS — H90A22 Sensorineural hearing loss, unilateral, left ear, with restricted hearing on the contralateral side: Secondary | ICD-10-CM | POA: Diagnosis not present

## 2021-11-24 ENCOUNTER — Other Ambulatory Visit: Payer: Self-pay

## 2021-11-24 MED ORDER — DOXAZOSIN MESYLATE 8 MG PO TABS: 8.0000 mg | ORAL_TABLET | Freq: Every day | ORAL | 0 refills | Status: DC

## 2021-12-05 NOTE — Progress Notes (Signed)
Cardiology Clinic Note   Patient Name: Isaiah Wilson Date of Encounter: 12/05/2021  Primary Care Provider:  Kristian Covey, MD Primary Cardiologist:  None  Patient Profile    Isaiah Wilson 87 year old male presents the clinic today for follow-up evaluation of his essential hypertension and hyperlipidemia.  Past Medical History    Past Medical History:  Diagnosis Date   Abnormal EKG    hx left anterior fasicular block on old ekg 2017   Allergic rhinitis    Allergy    Barrett's esophagus    Benign prostatic hypertrophy    Colitis, ischemic (HCC)    X 2   Complication of anesthesia    SLOW TO AWAKEN AFTER 1 SURGERY 20  YRS AGO   DJD (degenerative joint disease)    SPINE, AND OA   GERD (gastroesophageal reflux disease)    H/O hiatal hernia    HOH (hard of hearing)    BOTH EARS   Hyperlipidemia    no medication per pt.   Hypertension    pt denies says cardura for bladder   Neuropathy     LEFT HAND AND ARM NUMB ALL THE TIME   Overweight(278.02)    Past Surgical History:  Procedure Laterality Date   2 neck surgeries  2005-2006   Dr. Georgia Dom cx decompression, diskectomy,fusion C6-7 allograftWITH PLATE   APPENDECTOMY     EYE SURGERY     bilateral cataract surgerywith lens implant   FOOT SURGERY     right x 3   INNER EAR SURGERY Right    07/2020   LAPAROSCOPIC CHOLECYSTECTOMY  1993   Dr. Luan Moore   left total knee replacement  07/2007   Dr. Eulah Pont    right total knee replacement  2012   Dr. Romie Jumper  1975, May 2013   x3   TONSILLECTOMY  as child   TOTAL HIP ARTHROPLASTY Left 04/18/2018   Procedure: LEFT TOTAL HIP ARTHROPLASTY ANTERIOR APPROACH;  Surgeon: Ollen Gross, MD;  Location: [facility];  Service: Orthopedics;  Laterality: Left;   TOTAL HIP ARTHROPLASTY Right 03/13/2019   Procedure: TOTAL HIP ARTHROPLASTY ANTERIOR APPROACH;  Surgeon: Ollen Gross, MD;  Location: [facility];  Service: Orthopedics;  Laterality: Right;    TOTAL  KNEE REVISION Left 11/11/2015   Procedure: LEFT KNEE POLYETHELENE REVISION;  Surgeon: Ollen Gross, MD;  Location: [facility];  Service: Orthopedics;  Laterality: Left;   TOTAL KNEE REVISION Left 12/07/2016   Procedure: LEFT TOTAL KNEE REVISION;  Surgeon: Ollen Gross, MD;  Location: [facility];  Service: Orthopedics;  Laterality: Left;  Adductor Block    Allergies  No Known Allergies  History of Present Illness    Isaiah Wilson has a PMH of HLD, HTN, thoracic aortic aneurysm, and coronary artery disease.  He had a brain MRI 3/21 which showed moderate chronic microvascular ischemic changes.  His echocardiogram 5/21 showed normal LV function, severe LVH, G2 DD, left atrial enlargement, mild aortic insufficiency, and dilated ascending aorta measuring 40 felt more millimeters.  He underwent nuclear stress test 5/21 which showed an EF of 62% no ischemia.  He had a CTA 5/22 which showed a stable aortic aneurysm measuring 4.2 cm and atherosclerosis including his coronary arteries and aorta.  He had repeat CT angio chest 10/08/2021 which showed stable ascending aortic aneurysm measuring 4.2 cm.  He was seen in follow-up by Dr. Jens Som on 12/09/2020.  During that time reported no dyspnea or chest pain.  He  did note dizziness with standing which should improve with sitting.  He felt his dizziness was related to his ear.  He presents to the clinic today for follow-up evaluation and states***  *** denies chest pain, shortness of breath, lower extremity edema, fatigue, palpitations, melena, hematuria, hemoptysis, diaphoresis, weakness, presyncope, syncope, orthopnea, and PND.  Essential hypertension-BP today***.  Well-controlled at home. Continue amlodipine, doxazosin, losartan Heart healthy low-sodium diet-salty 6 given Increase physical activity as tolerated  Hyperlipidemia-LDL***. Continue rosuvastatin, aspirin Heart healthy low-sodium high-fiber diet Increase physical activity as  tolerated  Coronary artery disease-no recent episodes of arm neck back or chest discomfort.  Underwent stress testing 5/21 which showed EF of 62% no ischemia. Continue aspirin, rosuvastatin, amlodipine Heart healthy low-sodium diet-salty 6 given Increase physical activity as tolerated  Thoracic aortic aneurysm-blood pressure well controlled at home.  Importance of continued good blood pressure control reviewed.  CT angio chest 10/08/2021 showed stable aneurysm.  Details above. Repeat CT angio chest aorta 5/24.  Disposition: Follow-up with Dr. Jens Som or me in 12 months.  Home Medications    Prior to Admission medications   Medication Sig Start Date End Date Taking? Authorizing Provider  acetaminophen (TYLENOL) 650 MG CR tablet Take 650 mg by mouth every 8 (eight) hours as needed for pain.    [provider]  amLODipine (NORVASC) 5 MG tablet TAKE ONE TABLET BY MOUTH DAILY 01/08/21   [name], MD  aspirin EC 81 MG tablet Take 81 mg by mouth daily.    [provider]  azelastine (ASTELIN) 0.1 % nasal spray Place 1 spray into both nostrils 2 (two) times daily as needed (allergies.).     [provider]  clobetasol (TEMOVATE) 0.05 % external solution     [provider]  doxazosin (CARDURA) 8 MG tablet Take 1 tablet (8 mg total) by mouth at bedtime. 11/24/21   Burchette, Elberta Fortis, MD  EPINEPHrine 0.3 mg/0.3 mL IJ SOAJ injection Inject 0.3 mg into the muscle as needed (allergic reaction).     [provider]  esomeprazole (NEXIUM) 40 MG capsule TAKE ONE CAPSULE BY MOUTH DAILY (TAKE ON AN EMPTY STOMACH 30 MINUTES PRIOR TO A MEAL) FOR BARRETT'S ESOPHAGUS 06/17/21   Hilarie Fredrickson, MD  fluticasone [facility]) 50 MCG/ACT nasal spray Place 2 sprays into both nostrils daily.     [provider]  loratadine (CLARITIN) 10 MG tablet     [provider]  losartan (COZAAR) 100 MG tablet TAKE ONE TABLET BY MOUTH DAILY 08/31/21   Burchette, Elberta Fortis, MD  montelukast (SINGULAIR) 10 MG tablet TAKE ONE TABLET BY MOUTH EVERY NIGHT AT BEDTIME 10/11/21   Burchette, Elberta Fortis, MD  mupirocin ointment (BACTROBAN) 2 %  12/26/19   [provider]  rosuvastatin (CRESTOR) 40 MG tablet TAKE ONE-HALF TABLET BY MOUTH ONCE A DAY 02/20/21   [provider]  tobramycin-dexamethasone Wallene Dales) ophthalmic solution Apply to eye. 12/26/19   [provider]  vancomycin (VANCOCIN) 1 g injection     [provider]  zinc gluconate 50 MG tablet Take 50 mg by mouth daily.    [provider]    Family History    Family History  Problem Relation Age of Onset   Leukemia Mother    Heart disease Father    Heart disease Sister    Colon cancer Neg Hx    Stomach cancer Neg Hx    Rectal cancer Neg Hx    Esophageal cancer Neg Hx  He indicated that his mother is deceased. He indicated that his father is deceased. He indicated that his sister is alive. He indicated that the status of his neg hx is unknown. He indicated that the status of his other is unknown and reported the following: sibling alive.  Social History    Social History   Socioeconomic History   Marital status: Married    Spouse name: Not on file   Number of children: 2   Years of education: some college   Highest education level: Not on file  Occupational History   Occupation: retired  Tobacco Use   Smoking status: Never   Smokeless tobacco: Never  Vaping Use   Vaping Use: Never used  Substance and Sexual Activity   Alcohol use: No    Alcohol/week: 0.0 standard drinks of alcohol   Drug use: No   Sexual activity: Not on file  Other Topics Concern   Not on file  Social History Narrative   Married   HH 2   2 children; 3 granchildren; 2 great grandchildren   Social Determinants of Health   Financial Resource Strain: Low Risk  (05/03/2021)   Overall Financial Resource Strain (CARDIA)    Difficulty of Paying Living Expenses: Not hard at all   Food Insecurity: No Food Insecurity (05/03/2021)   Hunger Vital Sign    Worried About Running Out of Food in the Last Year: Never true    Ran Out of Food in the Last Year: Never true  Transportation Needs: No Transportation Needs (05/03/2021)   PRAPARE - Administrator, Civil Service (Medical): No    Lack of Transportation (Non-Medical): No  Physical Activity: Inactive (05/03/2021)   Exercise Vital Sign    Days of Exercise per Week: 0 days    Minutes of Exercise per Session: 0 min  Stress: Stress Concern Present (05/03/2021)   [employer] of Occupational Health - Occupational Stress Questionnaire    Feeling of Stress : To some extent  Social Connections: Socially Integrated (06/11/2019)   Social Connection and Isolation Panel [NHANES]    Frequency of Communication with Friends and Family: More than three times a week    Frequency of Social Gatherings with Friends and Family: Once a week    Attends Religious Services: More than 4 times per year    Active Member of [employer] or Organizations: Yes    Attends Engineer, structural: More than 4 times per year    Marital Status: Married  Catering manager Violence: Not on file     Review of Systems    General:  No chills, fever, night sweats or weight changes.  Cardiovascular:  No chest pain, dyspnea on exertion, edema, orthopnea, palpitations, paroxysmal nocturnal dyspnea. Dermatological: No rash, lesions/masses Respiratory: No cough, dyspnea Urologic: No hematuria, dysuria Abdominal:   No nausea, vomiting, diarrhea, bright red blood per rectum, melena, or hematemesis Neurologic:  No visual changes, wkns, changes in mental status. All other systems reviewed and are otherwise negative except as noted above.  Physical Exam    VS:  There were no vitals taken for this visit. , BMI There is no height or weight on file to calculate BMI. GEN: Well nourished, well developed, in no acute distress. HEENT: normal. Neck:  Supple, no JVD, carotid bruits, or masses. Cardiac: RRR, no murmurs, rubs, or gallops. No clubbing, cyanosis, edema.  Radials/DP/PT 2+ and equal bilaterally.  Respiratory:  Respirations regular and unlabored, clear to auscultation bilaterally. GI: Soft, nontender,  nondistended, BS + x 4. MS: no deformity or atrophy. Skin: warm and dry, no rash. Neuro:  Strength and sensation are intact. Psych: Normal affect.  Accessory Clinical Findings    Recent Labs: 02/10/2021: ALT 20   Recent Lipid Panel    Component Value Date/Time   CHOL 118 02/10/2021 1356   TRIG 162 (H) 02/10/2021 1356   HDL 39 (L) 02/10/2021 1356   CHOLHDL 3.0 02/10/2021 1356   CHOLHDL 4 11/22/2017 1048   VLDL 32.8 11/22/2017 1048   LDLCALC 52 02/10/2021 1356   LDLDIRECT 101.0 11/16/2016 0737    ECG personally reviewed by me today- *** - No acute changes  Echocardiogram 10/07/2019 IMPRESSIONS     1. Left ventricular ejection fraction, by estimation, is 60 to 65%. The  left ventricle has normal function. The left ventricle has no regional  wall motion abnormalities. There is severe concentric left ventricular  hypertrophy. Left ventricular diastolic   parameters are consistent with Grade II diastolic dysfunction  (pseudonormalization). Elevated left ventricular end-diastolic pressure.   2. Right ventricular systolic function is normal. The right ventricular  size is normal. There is normal pulmonary artery systolic pressure. The  estimated right ventricular systolic pressure is 19.0 mmHg.   3. Left atrial size was mildly dilated.   4. The mitral valve is normal in structure. Trivial mitral valve  regurgitation. No evidence of mitral stenosis.   5. The aortic valve is tricuspid. Aortic valve regurgitation is mild.  Mild aortic valve sclerosis is present, with no evidence of aortic valve  stenosis.   6. Aortic dilatation noted. There is mild dilatation of the ascending  aorta and of the aortic root measuring 44 mm  and 37mm respectively.   7. The inferior vena cava is normal in size with greater than 50%  respiratory variability, suggesting right atrial pressure of 3 mmHg.   FINDINGS   Left Ventricle: Left ventricular ejection fraction, by estimation, is 60  to 65%. The left ventricle has normal function. The left ventricle has no  regional wall motion abnormalities. The left ventricular internal cavity  size was normal in size. There is   severe concentric left ventricular hypertrophy. Left ventricular  diastolic parameters are consistent with Grade II diastolic dysfunction  (pseudonormalization). Elevated left ventricular end-diastolic pressure.   Right Ventricle: The right ventricular size is normal. No increase in  right ventricular wall thickness. Right ventricular systolic function is  normal. There is normal pulmonary artery systolic pressure. The tricuspid  regurgitant velocity is 2.00 m/s, and   with an assumed right atrial pressure of 3 mmHg, the estimated right  ventricular systolic pressure is 19.0 mmHg.   Left Atrium: Left atrial size was mildly dilated.   Right Atrium: Right atrial size was normal in size.   Pericardium: Trivial pericardial effusion is present. The pericardial  effusion is circumferential.   Mitral Valve: The mitral valve is normal in structure. Normal mobility of  the mitral valve leaflets. Trivial mitral valve regurgitation. No evidence  of mitral valve stenosis.   Tricuspid Valve: The tricuspid valve is normal in structure. Tricuspid  valve regurgitation is trivial. No evidence of tricuspid stenosis.   Aortic Valve: The aortic valve is tricuspid. Aortic valve regurgitation is  mild. Aortic regurgitation PHT measures 690 msec. Mild aortic valve  sclerosis is present, with no evidence of aortic valve stenosis.   Pulmonic Valve: The pulmonic valve was normal in structure. Pulmonic valve  regurgitation is trivial. No evidence of pulmonic stenosis.  Aorta:  Aortic dilatation noted. There is mild dilatation of the ascending  aorta and of the aortic root measuring 44 mm.   Venous: The inferior vena cava is normal in size with greater than 50%  respiratory variability, suggesting right atrial pressure of 3 mmHg.   IAS/Shunts: No atrial level shunt detected by color flow Doppler.  CT angio chest aorta 10/08/2021  COMPARISON:  Oct 13, 2020.   FINDINGS: Cardiovascular: Grossly stable 4.2 cm ascending thoracic aortic aneurysm is noted. No dissection is noted. Great vessels are widely patent without significant stenosis. Stable cardiomegaly. No pericardial effusion. Atherosclerosis of thoracic aorta is noted. Mild coronary calcifications are noted.   Mediastinum/Nodes: No enlarged mediastinal, hilar, or axillary lymph nodes. Thyroid gland, trachea, and esophagus demonstrate no significant findings.   Lungs/Pleura: Lungs are clear. No pleural effusion or pneumothorax.   Upper Abdomen: No acute abnormality.   Musculoskeletal: No chest wall abnormality. No acute or significant osseous findings.   Review of the MIP images confirms the above findings.   IMPRESSION: Grossly stable 4.2 cm ascending thoracic aortic aneurysm. Recommend annual imaging followup by CTA or MRA. This recommendation follows 2010 ACCF/AHA/AATS/ACR/ASA/SCA/SCAI/SIR/STS/SVM Guidelines for the Diagnosis and Management of Patients with Thoracic Aortic Disease. Circulation. 2010; 121: Z610-R604: E266-e369. Aortic aneurysm NOS (ICD10-I71.9).   Aortic Atherosclerosis (ICD10-I70.0).     Electronically Signed   By: Lupita RaiderJames  Green Jr M.D.   On: 10/08/2021 14:40 Assessment & Plan   1.  ***   Thomasene RippleJesse M. Niraj Kudrna NP-C     12/05/2021, 2:55 PM [facility] [address] Office [phone] Fax [phone]  Notice: This dictation was prepared with Dragon dictation along with smaller phrase technology. Any transcriptional errors that result from  this process are unintentional and may not be corrected upon review.  I spent***minutes examining this patient, reviewing medications, and using patient centered shared decision making involving her cardiac care.  Prior to her visit I spent greater than 20 minutes reviewing her past medical history,  medications, and prior cardiac tests.

## 2021-12-06 ENCOUNTER — Ambulatory Visit: Payer: Medicare PPO | Admitting: General Practice

## 2021-12-06 ENCOUNTER — Encounter: Payer: Self-pay | Admitting: General Practice

## 2021-12-06 VITALS — BP 122/60 | HR 54 | Ht 71.0 in | Wt 250.4 lb

## 2021-12-06 DIAGNOSIS — I712 Thoracic aortic aneurysm, without rupture, unspecified: Secondary | ICD-10-CM | POA: Diagnosis not present

## 2021-12-06 DIAGNOSIS — E785 Hyperlipidemia, unspecified: Secondary | ICD-10-CM | POA: Diagnosis not present

## 2021-12-06 DIAGNOSIS — I2510 Atherosclerotic heart disease of native coronary artery without angina pectoris: Secondary | ICD-10-CM | POA: Diagnosis not present

## 2021-12-06 DIAGNOSIS — I10 Essential (primary) hypertension: Secondary | ICD-10-CM | POA: Diagnosis not present

## 2021-12-06 DIAGNOSIS — J3089 Other allergic rhinitis: Secondary | ICD-10-CM | POA: Diagnosis not present

## 2021-12-06 DIAGNOSIS — J3081 Allergic rhinitis due to animal (cat) (dog) hair and dander: Secondary | ICD-10-CM | POA: Diagnosis not present

## 2021-12-06 DIAGNOSIS — J301 Allergic rhinitis due to pollen: Secondary | ICD-10-CM | POA: Diagnosis not present

## 2021-12-06 NOTE — Patient Instructions (Signed)
Medication Instructions:  The current medical regimen is effective;  continue present plan and medications as directed. Please refer to the Current Medication list given to you today.   *If you need a refill on your cardiac medications before your next appointment, please call your pharmacy*  Lab Work:   Testing/Procedures:  NONE    NONE  If you have labs (blood work) drawn today and your tests are completely normal, you will receive your results only by: MyChart Message (if you have MyChart) OR  A paper copy in the mail If you have any lab test that is abnormal or we need to change your treatment, we will call you to review the results.  Special Instructions CONTINUE HEART HEALTHY DIET  Cardiac CTA scanning (DUE 09-2022, (CAT scanning), is a noninvasive, special x-ray that produces cross-sectional images of the body using x-rays and a computer. CT scans help physicians diagnose and treat medical conditions. For some CT exams, a contrast material is used to enhance visibility in the area of the body being studied. CT scans provide greater clarity and reveal more details than regular x-ray exams.  Follow-Up: Your next appointment:  12 month(s) In Person with Olga Millers, MD   Please call our office 2 months in advance to schedule this appointment  :1  At [facility], you and your health needs are our priority.  As part of our continuing mission to provide you with exceptional heart care, we have created designated Provider Care Teams.  These Care Teams include your primary Cardiologist (physician) and Advanced Practice Providers (APPs -  Physician Assistants and Nurse Practitioners) who all work together to provide you with the care you need, when you need it.  We recommend signing up for the patient portal called "MyChart".  Sign up information is provided on this After Visit Summary.  MyChart is used to connect with patients for Virtual Visits (Telemedicine).  Patients are able to view  lab/test results, encounter notes, upcoming appointments, etc.  Non-urgent messages can be sent to your provider as well.   To learn more about what you can do with MyChart, go to [email or web address].    Important Information About Sugar

## 2021-12-14 ENCOUNTER — Telehealth: Payer: Self-pay | Admitting: Cardiology

## 2021-12-14 NOTE — Addendum Note (Signed)
Addended by: Alyson Ingles on: 12/14/2021 02:26 PM   Modules accepted: Orders

## 2021-12-14 NOTE — Telephone Encounter (Signed)
Returned call to patient, patient reports seeing NP recently and was told everything was good and nothing needed until next year.  He reports having CT scheduled in August and is unsure what this is for.     OV 7/10 with Sherlean Foot NP-due for CTA chest/aorta in 09/2022 Order entered for coronary CTA and got scheduled in August.   Advised would cancel and we will contact next year closer to date to schedule scan due in May.   Patient verbalized understanding.

## 2021-12-14 NOTE — Telephone Encounter (Signed)
fixed

## 2021-12-14 NOTE — Telephone Encounter (Signed)
Pt called stating that at his previous visit with Edd Fabian he was told that everything was fine with him. He states that he had a CT previously before his last appt and he wants to know why he is scheduled for another one in August. Please advise.

## 2021-12-20 ENCOUNTER — Other Ambulatory Visit: Payer: Self-pay | Admitting: Family Medicine

## 2021-12-28 ENCOUNTER — Other Ambulatory Visit (HOSPITAL_COMMUNITY): Payer: Medicare PPO

## 2022-01-03 DIAGNOSIS — J3089 Other allergic rhinitis: Secondary | ICD-10-CM | POA: Diagnosis not present

## 2022-01-03 DIAGNOSIS — J301 Allergic rhinitis due to pollen: Secondary | ICD-10-CM | POA: Diagnosis not present

## 2022-01-04 ENCOUNTER — Other Ambulatory Visit: Payer: Self-pay | Admitting: Family Medicine

## 2022-01-05 ENCOUNTER — Encounter: Payer: Self-pay | Admitting: Podiatry

## 2022-01-05 ENCOUNTER — Ambulatory Visit: Payer: Medicare PPO | Admitting: Podiatry

## 2022-01-05 DIAGNOSIS — M79674 Pain in right toe(s): Secondary | ICD-10-CM | POA: Diagnosis not present

## 2022-01-05 DIAGNOSIS — M79675 Pain in left toe(s): Secondary | ICD-10-CM

## 2022-01-05 DIAGNOSIS — D689 Coagulation defect, unspecified: Secondary | ICD-10-CM

## 2022-01-05 DIAGNOSIS — L84 Corns and callosities: Secondary | ICD-10-CM | POA: Diagnosis not present

## 2022-01-05 DIAGNOSIS — B351 Tinea unguium: Secondary | ICD-10-CM | POA: Diagnosis not present

## 2022-01-06 NOTE — Progress Notes (Signed)
Subjective:   Patient ID: Isaiah Wilson, male   DOB: 87 y.o.   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

## 2022-01-12 DIAGNOSIS — J301 Allergic rhinitis due to pollen: Secondary | ICD-10-CM | POA: Diagnosis not present

## 2022-01-12 DIAGNOSIS — J3081 Allergic rhinitis due to animal (cat) (dog) hair and dander: Secondary | ICD-10-CM | POA: Diagnosis not present

## 2022-01-12 DIAGNOSIS — J3089 Other allergic rhinitis: Secondary | ICD-10-CM | POA: Diagnosis not present

## 2022-01-17 DIAGNOSIS — J301 Allergic rhinitis due to pollen: Secondary | ICD-10-CM | POA: Diagnosis not present

## 2022-01-17 DIAGNOSIS — J3089 Other allergic rhinitis: Secondary | ICD-10-CM | POA: Diagnosis not present

## 2022-01-17 DIAGNOSIS — J3081 Allergic rhinitis due to animal (cat) (dog) hair and dander: Secondary | ICD-10-CM | POA: Diagnosis not present

## 2022-01-23 ENCOUNTER — Other Ambulatory Visit: Payer: Self-pay | Admitting: Family Medicine

## 2022-02-02 DIAGNOSIS — J301 Allergic rhinitis due to pollen: Secondary | ICD-10-CM | POA: Diagnosis not present

## 2022-02-07 DIAGNOSIS — J301 Allergic rhinitis due to pollen: Secondary | ICD-10-CM | POA: Diagnosis not present

## 2022-02-07 DIAGNOSIS — J3081 Allergic rhinitis due to animal (cat) (dog) hair and dander: Secondary | ICD-10-CM | POA: Diagnosis not present

## 2022-02-07 DIAGNOSIS — J3089 Other allergic rhinitis: Secondary | ICD-10-CM | POA: Diagnosis not present

## 2022-02-10 ENCOUNTER — Other Ambulatory Visit: Payer: Self-pay | Admitting: Cardiology

## 2022-02-10 DIAGNOSIS — E785 Hyperlipidemia, unspecified: Secondary | ICD-10-CM

## 2022-02-17 DIAGNOSIS — J301 Allergic rhinitis due to pollen: Secondary | ICD-10-CM | POA: Diagnosis not present

## 2022-02-22 DIAGNOSIS — J3089 Other allergic rhinitis: Secondary | ICD-10-CM | POA: Diagnosis not present

## 2022-02-22 DIAGNOSIS — J301 Allergic rhinitis due to pollen: Secondary | ICD-10-CM | POA: Diagnosis not present

## 2022-02-22 DIAGNOSIS — J3081 Allergic rhinitis due to animal (cat) (dog) hair and dander: Secondary | ICD-10-CM | POA: Diagnosis not present

## 2022-03-07 DIAGNOSIS — M25572 Pain in left ankle and joints of left foot: Secondary | ICD-10-CM | POA: Diagnosis not present

## 2022-03-09 ENCOUNTER — Ambulatory Visit: Payer: Medicare PPO | Admitting: Podiatry

## 2022-03-09 ENCOUNTER — Encounter: Payer: Self-pay | Admitting: Podiatry

## 2022-03-09 DIAGNOSIS — B351 Tinea unguium: Secondary | ICD-10-CM

## 2022-03-09 DIAGNOSIS — D689 Coagulation defect, unspecified: Secondary | ICD-10-CM | POA: Diagnosis not present

## 2022-03-09 DIAGNOSIS — M79674 Pain in right toe(s): Secondary | ICD-10-CM

## 2022-03-09 DIAGNOSIS — M79675 Pain in left toe(s): Secondary | ICD-10-CM

## 2022-03-10 NOTE — Progress Notes (Signed)
Subjective:   Patient ID: Isaiah Wilson, male   DOB: 87 y.o.   MRN: [id number]   HPI Patient presents with elongated nailbeds 1-5 both feet that are thick and do get incurvated in the corners   ROS      Objective:  Physical Exam  Neurovascular status intact thick yellow brittle nailbeds 1-5 both feet mycotic in nature     Assessment:  Chronic mycotic nail infections of the nails 1-5 both feet that he cannot take care of them become painful in the corners with patient at risk     Plan:  Debridement nailbeds today 1-5 both feet neurogenic bleeding reappoint routine care

## 2022-03-17 DIAGNOSIS — J301 Allergic rhinitis due to pollen: Secondary | ICD-10-CM | POA: Diagnosis not present

## 2022-03-17 DIAGNOSIS — J3081 Allergic rhinitis due to animal (cat) (dog) hair and dander: Secondary | ICD-10-CM | POA: Diagnosis not present

## 2022-03-17 DIAGNOSIS — J3089 Other allergic rhinitis: Secondary | ICD-10-CM | POA: Diagnosis not present

## 2022-03-23 ENCOUNTER — Ambulatory Visit (INDEPENDENT_AMBULATORY_CARE_PROVIDER_SITE_OTHER): Payer: Medicare PPO | Admitting: Family Medicine

## 2022-03-23 ENCOUNTER — Encounter: Payer: Self-pay | Admitting: Family Medicine

## 2022-03-23 VITALS — BP 118/60 | HR 47 | Temp 97.5°F | Ht 71.0 in | Wt 255.1 lb

## 2022-03-23 DIAGNOSIS — E785 Hyperlipidemia, unspecified: Secondary | ICD-10-CM | POA: Diagnosis not present

## 2022-03-23 DIAGNOSIS — I10 Essential (primary) hypertension: Secondary | ICD-10-CM | POA: Diagnosis not present

## 2022-03-23 DIAGNOSIS — E538 Deficiency of other specified B group vitamins: Secondary | ICD-10-CM | POA: Diagnosis not present

## 2022-03-23 DIAGNOSIS — J3089 Other allergic rhinitis: Secondary | ICD-10-CM | POA: Diagnosis not present

## 2022-03-23 DIAGNOSIS — J301 Allergic rhinitis due to pollen: Secondary | ICD-10-CM | POA: Diagnosis not present

## 2022-03-23 LAB — VITAMIN B12: Vitamin B-12: 776 pg/mL (ref 211–911)

## 2022-03-23 NOTE — Progress Notes (Unsigned)
Established Patient Office Visit  Subjective   Patient ID: Isaiah Wilson, male    DOB: 1934/11/29  Age: 87 y.o. MRN: [id number]  No chief complaint on file.   HPI  {History (Optional):23778} Isaiah Wilson is seen for medical follow-up.  He has history of hypertension, GERD, history of Barrett's esophagus, chronic neuropathy involving both lower extremities, chronic suppurative otitis media, bilateral sensorineural hearing loss, osteoarthritis, reported history of B12 deficiency, hyperlipidemia.  He brings in copy of labs he had through the [place] few months ago and this included vitamin D level which was normal, normal TSH, A1c 6.1% along with lipids and chemistries.  His triglycerides were mildly elevated.  LDL cholesterol is 13.  He does take rosuvastatin 20 mg daily for that.  He has hypertension treated with losartan and amlodipine.  He is also taking doxazosin which she was started on apparently for BPH.  He does have some chronic dizziness but this sounds more vertigo related than orthostatic.  Blood pressure today 118/66 he did and 122/60 standing.  He and his wife have already had flu vaccine.  He relates he was diagnosed with B12 deficiency and started apparently on B12 injections recently per another physician.  He is requesting follow-up B12 level today.  He cannot recall what his B12 level was.  He does have some chronic balance issues but denies any recent fall  Past Medical History:  Diagnosis Date   Abnormal EKG    hx left anterior fasicular block on old ekg 2017   Allergic rhinitis    Allergy    Barrett's esophagus    Benign prostatic hypertrophy    Colitis, ischemic (HCC)    X 2   Complication of anesthesia    SLOW TO AWAKEN AFTER 1 SURGERY 20  YRS AGO   DJD (degenerative joint disease)    SPINE, AND OA   GERD (gastroesophageal reflux disease)    H/O hiatal hernia    HOH (hard of hearing)    BOTH EARS   Hyperlipidemia    no medication per pt.   Hypertension     pt denies says cardura for bladder   Neuropathy     LEFT HAND AND ARM NUMB ALL THE TIME   Overweight(278.02)    Past Surgical History:  Procedure Laterality Date   2 neck surgeries  2005-2006   Dr. Georgia Dom cx decompression, diskectomy,fusion C6-7 allograftWITH PLATE   APPENDECTOMY     EYE SURGERY     bilateral cataract surgerywith lens implant   FOOT SURGERY     right x 3   INNER EAR SURGERY Right    07/2020   LAPAROSCOPIC CHOLECYSTECTOMY  1993   Dr. Luan Moore   left total knee replacement  07/2007   Dr. Eulah Pont    right total knee replacement  2012   Dr. Romie Jumper  1975, May 2013   x3   TONSILLECTOMY  as child   TOTAL HIP ARTHROPLASTY Left 04/18/2018   Procedure: LEFT TOTAL HIP ARTHROPLASTY ANTERIOR APPROACH;  Surgeon: Ollen Gross, MD;  Location: [facility];  Service: Orthopedics;  Laterality: Left;   TOTAL HIP ARTHROPLASTY Right 03/13/2019   Procedure: TOTAL HIP ARTHROPLASTY ANTERIOR APPROACH;  Surgeon: Ollen Gross, MD;  Location: [facility];  Service: Orthopedics;  Laterality: Right;    TOTAL KNEE REVISION Left 11/11/2015   Procedure: LEFT KNEE POLYETHELENE REVISION;  Surgeon: Ollen Gross, MD;  Location: [facility];  Service: Orthopedics;  Laterality: Left;   TOTAL KNEE  REVISION Left 12/07/2016   Procedure: LEFT TOTAL KNEE REVISION;  Surgeon: Gaynelle Arabian, MD;  Location: [facility];  Service: Orthopedics;  Laterality: Left;  Adductor Block    reports that he has never smoked. He has never used smokeless tobacco. He reports that he does not drink alcohol and does not use drugs. family history includes Heart disease in his father and sister; Leukemia in his mother. No Known Allergies  Review of Systems  Constitutional:  Negative for chills, fever, malaise/fatigue and weight loss.  Eyes:  Negative for blurred vision.  Respiratory:  Negative for shortness of breath.   Cardiovascular:  Negative for chest pain.  Gastrointestinal:  Negative for abdominal pain.   Neurological:  Negative for dizziness, weakness and headaches.      Objective:     BP 118/60 (BP Location: Left Arm, Patient Position: Sitting, Cuff Size: Normal)   Pulse (!) 47   Temp (!) 97.5 F (36.4 C) (Oral)   Ht 5\' 11"  (1.803 m)   Wt 255 lb 1.6 oz (115.7 kg)   SpO2 98%   BMI 35.58 kg/m  {Vitals History (Optional):23777}  Physical Exam Constitutional:      Appearance: He is well-developed.  HENT:     Right Ear: External ear normal.     Left Ear: External ear normal.  Eyes:     Pupils: Pupils are equal, round, and reactive to light.  Neck:     Thyroid: No thyromegaly.  Cardiovascular:     Rate and Rhythm: Normal rate and regular rhythm.  Pulmonary:     Effort: Pulmonary effort is normal. No respiratory distress.     Breath sounds: Normal breath sounds. No wheezing or rales.  Musculoskeletal:     Cervical back: Neck supple.     Comments: Trace pitting edema lower legs bilaterally.  Both feet are warm to touch with good dorsalis pedis pulse and excellent capillary refill.  Neurological:     Mental Status: He is alert and oriented to person, place, and time.      No results found for any visits on 03/23/22.  {Labs (Optional):23779}  The ASCVD Risk score (Arnett DK, et al., 2019) failed to calculate for the following reasons:   The 2019 ASCVD risk score is only valid for ages 40 to 6    Assessment & Plan:   #1 hypertension stable and well-controlled.  No orthostasis on blood pressure standing today.  Continue current medications with losartan and amlodipine.  He had recent chemistries through [place] which were stable  #2 hyperlipidemia treated with Crestor 20 mg daily.  Recent lipids stable.  #3 reported B12 deficiency.  Patient requesting follow-up B12 level today.  This was ordered.   No follow-ups on file.    Carolann Littler, MD

## 2022-03-31 ENCOUNTER — Other Ambulatory Visit: Payer: Self-pay | Admitting: Cardiology

## 2022-04-05 ENCOUNTER — Other Ambulatory Visit (HOSPITAL_BASED_OUTPATIENT_CLINIC_OR_DEPARTMENT_OTHER): Payer: Self-pay

## 2022-04-05 MED ORDER — COVID-19 MRNA VAC-TRIS(PFIZER) 30 MCG/0.3ML IM SUSY
0.3000 mL | PREFILLED_SYRINGE | Freq: Once | INTRAMUSCULAR | 0 refills | Status: AC
  Filled 2022-04-05: qty 0.3, 1d supply, fill #0

## 2022-04-08 ENCOUNTER — Other Ambulatory Visit: Payer: Self-pay | Admitting: Family Medicine

## 2022-04-16 ENCOUNTER — Other Ambulatory Visit (HOSPITAL_BASED_OUTPATIENT_CLINIC_OR_DEPARTMENT_OTHER): Payer: Self-pay

## 2022-04-16 MED ORDER — FLUAD QUADRIVALENT 0.5 ML IM PRSY
0.5000 mL | PREFILLED_SYRINGE | Freq: Once | INTRAMUSCULAR | 0 refills | Status: AC
  Filled 2022-04-16: qty 0.5, 1d supply, fill #0

## 2022-04-26 ENCOUNTER — Other Ambulatory Visit: Payer: Self-pay | Admitting: Family Medicine

## 2022-05-10 DIAGNOSIS — J3081 Allergic rhinitis due to animal (cat) (dog) hair and dander: Secondary | ICD-10-CM | POA: Diagnosis not present

## 2022-05-10 DIAGNOSIS — J301 Allergic rhinitis due to pollen: Secondary | ICD-10-CM | POA: Diagnosis not present

## 2022-05-10 DIAGNOSIS — J3089 Other allergic rhinitis: Secondary | ICD-10-CM | POA: Diagnosis not present

## 2022-05-11 ENCOUNTER — Ambulatory Visit: Payer: Medicare PPO | Admitting: Podiatry

## 2022-05-16 ENCOUNTER — Encounter: Payer: Self-pay | Admitting: Podiatry

## 2022-05-16 ENCOUNTER — Ambulatory Visit: Payer: Medicare PPO | Admitting: Podiatry

## 2022-05-16 DIAGNOSIS — M7752 Other enthesopathy of left foot: Secondary | ICD-10-CM

## 2022-05-16 DIAGNOSIS — L84 Corns and callosities: Secondary | ICD-10-CM | POA: Diagnosis not present

## 2022-05-16 DIAGNOSIS — D689 Coagulation defect, unspecified: Secondary | ICD-10-CM

## 2022-05-16 DIAGNOSIS — M7751 Other enthesopathy of right foot: Secondary | ICD-10-CM

## 2022-05-16 MED ORDER — TRIAMCINOLONE ACETONIDE 10 MG/ML IJ SUSP
20.0000 mg | Freq: Once | INTRAMUSCULAR | Status: AC
  Administered 2022-05-16: 20 mg

## 2022-05-17 DIAGNOSIS — J301 Allergic rhinitis due to pollen: Secondary | ICD-10-CM | POA: Diagnosis not present

## 2022-05-17 DIAGNOSIS — J3081 Allergic rhinitis due to animal (cat) (dog) hair and dander: Secondary | ICD-10-CM | POA: Diagnosis not present

## 2022-05-17 DIAGNOSIS — J3089 Other allergic rhinitis: Secondary | ICD-10-CM | POA: Diagnosis not present

## 2022-05-17 NOTE — Progress Notes (Signed)
Subjective:   Patient ID: Isaiah Wilson, male   DOB: 87 y.o.   MRN: [id number]   HPI Patient presents with a lot of pain in the ankles of both feet and lesions on both feet that gets sore with patient having a blood clotting disorder and cannot take care of himself.  His nails also can get elongated bothersome   ROS      Objective:  Physical Exam  Neurovascular status intact inflammation of the sinus tarsi bilateral with fluid buildup with patient noted to have keratotic lesion subfirst metatarsal bilateral and elongated nails     Assessment:  Inflammatory capsulitis sinus tarsi bilateral along with lesion formation bilateral nail disease     Plan:  H&P reviewed all conditions did go ahead today did sterile prep injected the sinus tarsi bilateral 3 mg Kenalog 5 mg Xylocaine debrided lesions no angiogenic bleeding courtesy debridement nailbeds reappoint routine care as needed

## 2022-06-02 DIAGNOSIS — J3089 Other allergic rhinitis: Secondary | ICD-10-CM | POA: Diagnosis not present

## 2022-06-02 DIAGNOSIS — J301 Allergic rhinitis due to pollen: Secondary | ICD-10-CM | POA: Diagnosis not present

## 2022-06-02 DIAGNOSIS — J3081 Allergic rhinitis due to animal (cat) (dog) hair and dander: Secondary | ICD-10-CM | POA: Diagnosis not present

## 2022-06-03 DIAGNOSIS — H90A22 Sensorineural hearing loss, unilateral, left ear, with restricted hearing on the contralateral side: Secondary | ICD-10-CM | POA: Diagnosis not present

## 2022-06-03 DIAGNOSIS — H90A31 Mixed conductive and sensorineural hearing loss, unilateral, right ear with restricted hearing on the contralateral side: Secondary | ICD-10-CM | POA: Diagnosis not present

## 2022-06-03 DIAGNOSIS — H6993 Unspecified Eustachian tube disorder, bilateral: Secondary | ICD-10-CM | POA: Diagnosis not present

## 2022-06-03 DIAGNOSIS — Z4589 Encounter for adjustment and management of other implanted devices: Secondary | ICD-10-CM | POA: Diagnosis not present

## 2022-06-03 DIAGNOSIS — Z9089 Acquired absence of other organs: Secondary | ICD-10-CM | POA: Diagnosis not present

## 2022-06-03 DIAGNOSIS — I951 Orthostatic hypotension: Secondary | ICD-10-CM | POA: Diagnosis not present

## 2022-06-03 DIAGNOSIS — Z4582 Encounter for adjustment or removal of myringotomy device (stent) (tube): Secondary | ICD-10-CM | POA: Diagnosis not present

## 2022-06-03 DIAGNOSIS — H903 Sensorineural hearing loss, bilateral: Secondary | ICD-10-CM | POA: Diagnosis not present

## 2022-06-08 DIAGNOSIS — L218 Other seborrheic dermatitis: Secondary | ICD-10-CM | POA: Diagnosis not present

## 2022-06-08 DIAGNOSIS — L821 Other seborrheic keratosis: Secondary | ICD-10-CM | POA: Diagnosis not present

## 2022-06-08 DIAGNOSIS — L814 Other melanin hyperpigmentation: Secondary | ICD-10-CM | POA: Diagnosis not present

## 2022-06-08 DIAGNOSIS — J301 Allergic rhinitis due to pollen: Secondary | ICD-10-CM | POA: Diagnosis not present

## 2022-06-08 DIAGNOSIS — D225 Melanocytic nevi of trunk: Secondary | ICD-10-CM | POA: Diagnosis not present

## 2022-06-20 ENCOUNTER — Telehealth: Payer: Self-pay | Admitting: Family Medicine

## 2022-06-20 NOTE — Telephone Encounter (Signed)
Tried calling patient to schedule Medicare Annual Wellness Visit (AWV) either virtually or in office. my Herbie Drape number [phone]   No answer   Last AWV 05/03/21   please schedule with Nurse Health Adviser   45 min for awv-i  in office appointments 30 min for awv-s & awv-i phone/virtual appointments

## 2022-06-28 DIAGNOSIS — J301 Allergic rhinitis due to pollen: Secondary | ICD-10-CM | POA: Diagnosis not present

## 2022-06-28 DIAGNOSIS — J3081 Allergic rhinitis due to animal (cat) (dog) hair and dander: Secondary | ICD-10-CM | POA: Diagnosis not present

## 2022-06-28 DIAGNOSIS — J3089 Other allergic rhinitis: Secondary | ICD-10-CM | POA: Diagnosis not present

## 2022-07-08 DIAGNOSIS — J3089 Other allergic rhinitis: Secondary | ICD-10-CM | POA: Diagnosis not present

## 2022-07-08 DIAGNOSIS — J3081 Allergic rhinitis due to animal (cat) (dog) hair and dander: Secondary | ICD-10-CM | POA: Diagnosis not present

## 2022-07-08 DIAGNOSIS — J301 Allergic rhinitis due to pollen: Secondary | ICD-10-CM | POA: Diagnosis not present

## 2022-07-14 DIAGNOSIS — J3089 Other allergic rhinitis: Secondary | ICD-10-CM | POA: Diagnosis not present

## 2022-07-14 DIAGNOSIS — J301 Allergic rhinitis due to pollen: Secondary | ICD-10-CM | POA: Diagnosis not present

## 2022-07-15 DIAGNOSIS — J301 Allergic rhinitis due to pollen: Secondary | ICD-10-CM | POA: Diagnosis not present

## 2022-07-26 ENCOUNTER — Other Ambulatory Visit: Payer: Self-pay | Admitting: Family Medicine

## 2022-08-01 ENCOUNTER — Encounter: Payer: Self-pay | Admitting: Podiatry

## 2022-08-01 ENCOUNTER — Ambulatory Visit: Payer: Medicare PPO | Admitting: Podiatry

## 2022-08-01 DIAGNOSIS — M7752 Other enthesopathy of left foot: Secondary | ICD-10-CM | POA: Diagnosis not present

## 2022-08-01 DIAGNOSIS — M7751 Other enthesopathy of right foot: Secondary | ICD-10-CM | POA: Diagnosis not present

## 2022-08-01 DIAGNOSIS — L84 Corns and callosities: Secondary | ICD-10-CM | POA: Diagnosis not present

## 2022-08-01 DIAGNOSIS — D689 Coagulation defect, unspecified: Secondary | ICD-10-CM

## 2022-08-01 MED ORDER — TRIAMCINOLONE ACETONIDE 10 MG/ML IJ SUSP
20.0000 mg | Freq: Once | INTRAMUSCULAR | Status: AC
  Administered 2022-08-01: 20 mg

## 2022-08-03 DIAGNOSIS — J301 Allergic rhinitis due to pollen: Secondary | ICD-10-CM | POA: Diagnosis not present

## 2022-08-03 DIAGNOSIS — J3089 Other allergic rhinitis: Secondary | ICD-10-CM | POA: Diagnosis not present

## 2022-08-03 DIAGNOSIS — J3081 Allergic rhinitis due to animal (cat) (dog) hair and dander: Secondary | ICD-10-CM | POA: Diagnosis not present

## 2022-08-03 NOTE — Progress Notes (Signed)
Subjective:   Patient ID: Isaiah Wilson, male   DOB: 88 y.o.   MRN: [id number]   HPI Patient presents with a lot of pain in both of his sinus tarsi's which did well at last visit and also has keratotic lesions bilateral that get painful he cannot cut with patient having long-term history of blood thinner   ROS      Objective:  Physical Exam  Neurovascular status intact inflammation pain sinus tarsi bilateral with patient also found to have keratotic lesion subfirst metatarsal head bilateral and on blood thinner currently     Assessment:  High risk patient with chronic lesion formation along with inflammatory capsulitis and does have thick toenails     Plan:  H&P reviewed all debrided lesions no angiogenic bleeding sterile prep injected the sinus tarsi bilateral 3 mg Kenalog 5 mg Xylocaine and then did courtesy debridement of nailbeds.  Reappoint routine care

## 2022-08-21 ENCOUNTER — Other Ambulatory Visit: Payer: Self-pay | Admitting: Family Medicine

## 2022-08-23 ENCOUNTER — Telehealth: Payer: Self-pay | Admitting: Family Medicine

## 2022-08-23 NOTE — Telephone Encounter (Signed)
Called patient to schedule Medicare Annual Wellness Visit (AWV). Left message for patient to call back and schedule Medicare Annual Wellness Visit (AWV).  Last date of AWV: 05/03/21  Please schedule an appointment at any time with [facility] or [employer].  If any questions, please contact me at [phone].  Thank you ,  Barkley Boards AWV direct phone # [phone]

## 2022-08-23 NOTE — Telephone Encounter (Signed)
Patient returned my call  he will call me back to schedule needs to be around his calendar

## 2022-09-09 DIAGNOSIS — H90A31 Mixed conductive and sensorineural hearing loss, unilateral, right ear with restricted hearing on the contralateral side: Secondary | ICD-10-CM | POA: Diagnosis not present

## 2022-09-09 DIAGNOSIS — H918X3 Other specified hearing loss, bilateral: Secondary | ICD-10-CM | POA: Diagnosis not present

## 2022-09-09 DIAGNOSIS — H90A22 Sensorineural hearing loss, unilateral, left ear, with restricted hearing on the contralateral side: Secondary | ICD-10-CM | POA: Diagnosis not present

## 2022-09-09 DIAGNOSIS — H6993 Unspecified Eustachian tube disorder, bilateral: Secondary | ICD-10-CM | POA: Diagnosis not present

## 2022-09-09 DIAGNOSIS — Z9089 Acquired absence of other organs: Secondary | ICD-10-CM | POA: Diagnosis not present

## 2022-09-13 DIAGNOSIS — J301 Allergic rhinitis due to pollen: Secondary | ICD-10-CM | POA: Diagnosis not present

## 2022-09-13 DIAGNOSIS — J3081 Allergic rhinitis due to animal (cat) (dog) hair and dander: Secondary | ICD-10-CM | POA: Diagnosis not present

## 2022-09-13 DIAGNOSIS — J3089 Other allergic rhinitis: Secondary | ICD-10-CM | POA: Diagnosis not present

## 2022-09-14 ENCOUNTER — Other Ambulatory Visit: Payer: Self-pay | Admitting: *Deleted

## 2022-09-14 DIAGNOSIS — I712 Thoracic aortic aneurysm, without rupture, unspecified: Secondary | ICD-10-CM

## 2022-10-03 ENCOUNTER — Encounter: Payer: Self-pay | Admitting: Podiatry

## 2022-10-03 ENCOUNTER — Ambulatory Visit: Payer: Medicare PPO | Admitting: Podiatry

## 2022-10-03 ENCOUNTER — Telehealth: Payer: Self-pay | Admitting: Family Medicine

## 2022-10-03 DIAGNOSIS — D689 Coagulation defect, unspecified: Secondary | ICD-10-CM

## 2022-10-03 DIAGNOSIS — L84 Corns and callosities: Secondary | ICD-10-CM

## 2022-10-03 DIAGNOSIS — M7752 Other enthesopathy of left foot: Secondary | ICD-10-CM | POA: Diagnosis not present

## 2022-10-03 DIAGNOSIS — M7751 Other enthesopathy of right foot: Secondary | ICD-10-CM

## 2022-10-03 MED ORDER — TRIAMCINOLONE ACETONIDE 10 MG/ML IJ SUSP
20.0000 mg | Freq: Once | INTRAMUSCULAR | Status: AC
Start: 2022-10-03 — End: 2022-10-03
  Administered 2022-10-03: 20 mg

## 2022-10-03 NOTE — Telephone Encounter (Signed)
Contacted Kyung Rudd to schedule their annual wellness visit. Appointment made for 10/11/22.  Rudell Cobb AWV direct phone # [phone]

## 2022-10-03 NOTE — Progress Notes (Signed)
Subjective:   Patient ID: Isaiah Wilson, male   DOB: 88 y.o.   MRN: [id number]   HPI Patient states his ankles are really bothering him again and he would love to get injections understanding risk and he has calluses on both feet that get very tender   ROS      Objective:  Physical Exam  Neurovascular status intact inflammation pain of the sinus tarsi bilateral fluid buildup present with thick keratotic lesion subfirst metatarsal bilateral with patient on blood thinner     Assessment:  High risk patient chronic lesion for who cannot take care of along with inflammatory capsulitis sinus tarsi bilateral     Plan:  Went ahead today sterile prep injected the sinus tarsi bilateral 3 mg Kenalog 5 mg Xylocaine and debrided lesions bilateral no angiogenic bleeding reappoint routine care

## 2022-10-11 ENCOUNTER — Ambulatory Visit (INDEPENDENT_AMBULATORY_CARE_PROVIDER_SITE_OTHER): Payer: Medicare PPO | Admitting: Family Medicine

## 2022-10-11 ENCOUNTER — Encounter: Payer: Self-pay | Admitting: Family Medicine

## 2022-10-11 VITALS — Ht 71.0 in | Wt 255.0 lb

## 2022-10-11 DIAGNOSIS — Z0000 Encounter for general adult medical examination without abnormal findings: Secondary | ICD-10-CM | POA: Diagnosis not present

## 2022-10-11 NOTE — Patient Instructions (Signed)
I really enjoyed getting to talk with you today! I am available on Tuesdays and Thursdays for virtual visits if you have any questions or concerns, or if I can be of any further assistance.   CHECKLIST FROM ANNUAL WELLNESS VISIT:  -Follow up (please call to schedule if not scheduled after visit):   -yearly for annual wellness visit with primary care office  Here is a list of your preventive care/health maintenance measures and the plan for each if any are due:  PLAN For any measures below that may be due:   Health Maintenance  Topic Date Due   DTaP/Tdap/Td (2 - Tdap) 06/23/2019   COVID-19 Vaccine (7 - 2023-24 season) 05/31/2022   INFLUENZA VACCINE  12/29/2022   Medicare Annual Wellness (AWV)  10/11/2023   Pneumonia Vaccine 18+ Years old  Completed   Zoster Vaccines- Shingrix  Completed   HPV VACCINES  Aged Out    -See a dentist at least yearly  -Get your eyes checked and then per your eye specialist's recommendations  -Other issues addressed today:   -I have included below further information regarding a healthy whole foods based diet, physical activity guidelines for adults, stress management and opportunities for social connections. I hope you find this information useful.   -----------------------------------------------------------------------------------------------------------------------------------------------------------------------------------------------------------------------------------------------------------  NUTRITION: -eat real food: lots of colorful vegetables (half the plate) and fruits -5-7 servings of vegetables and fruits per day (fresh or steamed is best), exp. 2 servings of vegetables with lunch and dinner and 2 servings of fruit per day. Berries and greens such as kale and collards are great choices.  -consume on a regular basis: whole grains (make sure first ingredient on label contains the word "whole"), fresh fruits, fish, nuts, seeds, healthy oils  (such as olive oil, avocado oil, grape seed oil) -may eat small amounts of dairy and lean meat on occasion, but avoid processed meats such as ham, bacon, lunch meat, etc. -drink water -try to avoid fast food and pre-packaged foods, processed meat -most experts advise limiting sodium to < 2300mg  per day, should limit further is any chronic conditions such as high blood pressure, heart disease, diabetes, etc. The American Heart Association advised that < 1500mg  is is ideal -try to avoid foods that contain any ingredients with names you do not recognize  -try to avoid sugar/sweets (except for the natural sugar that occurs in fresh fruit) -try to avoid sweet drinks -try to avoid white rice, white bread, pasta (unless whole grain), white or yellow potatoes  EXERCISE GUIDELINES FOR ADULTS: -if you wish to increase your physical activity, do so gradually and with the approval of your doctor -STOP and seek medical care immediately if you have any chest pain, chest discomfort or trouble breathing when starting or increasing exercise  -move and stretch your body, legs, feet and arms when sitting for long periods -Physical activity guidelines for optimal health in adults: -least 150 minutes per week of aerobic exercise (can talk, but not sing) once approved by your doctor, 20-30 minutes of sustained activity or two 10 minute episodes of sustained activity every day.  -resistance training at least 2 days per week if approved by your doctor -balance exercises 3+ days per week:   Stand somewhere where you have something sturdy to hold onto if you lose balance.    1) lift up on toes, start with 5x per day and work up to 20x   2) stand and lift on leg straight out to the side so that foot is  a few inches of the floor, start with 5x each side and work up to 20x each side   3) stand on one foot, start with 5 seconds each side and work up to 20 seconds on each side  If you need ideas or help with getting more  active:  -Silver sneakers https://tools.silversneakers.com  -Walk with a Doc: http://www.duncan-williams.com/  -try to include resistance (weight lifting/strength building) and balance exercises twice per week: or the following link for ideas: http://castillo-powell.com/  BuyDucts.dk  STRESS MANAGEMENT: -can try meditating, or just sitting quietly with deep breathing while intentionally relaxing all parts of your body for 5 minutes daily -if you need further help with stress, anxiety or depression please follow up with your primary doctor or contact the wonderful folks at [facility]: [phone]  SOCIAL CONNECTIONS: -options in [place] if you wish to engage in more social and exercise related activities:  -Silver sneakers https://tools.silversneakers.com  -Walk with a Doc: http://www.duncan-williams.com/  -Check out the [facility] Active Adults 50+ section on the [place] [employer] (hiking clubs, book clubs, cards and games, chess, exercise classes, aquatic classes and much more) - see the website for details: https://www.[place].gov/departments/parks-recreation/active-adults50  -YouTube has lots of exercise videos for different ages and abilities as well  -[facility] (a variety of indoor and outdoor inperson activities for adults). [phone]. [address]  -Virtual Online Classes (a variety of topics): see seniorplanet.org or call 337-128-1946  -consider volunteering at a school, hospice center, church, senior center or elsewhere

## 2022-10-11 NOTE — Progress Notes (Signed)
PATIENT CHECK-IN and HEALTH RISK ASSESSMENT QUESTIONNAIRE:  -completed by phone/video for upcoming Medicare Preventive Visit  Pre-Visit Check-in: 1)Vitals (height, wt, BP, etc) - record in vitals section for visit on day of visit 2)Review and Update Medications, Allergies PMH, Surgeries, Social history in Epic 3)Hospitalizations in the last year with date/reason?  No  4)Review and Update Care Team (patient's specialists) in Epic 5) Complete PHQ9 in Epic  6) Complete Fall Screening in Epic 7)Review all Health Maintenance Due and order under PCP if not done.  Medicare Wellness Patient Questionnaire:  Answer theses question about your habits: Do you drink alcohol? No If yes, how many drinks do you have a day?N/A Have you ever smoked?No Quit date if applicable? N/A  How many packs a day do/did you smoke? N/A Do you use smokeless tobacco?No Do you use an illicit drugs?No Do you exercises? No - takes care of all the household chores and his wife and so is very active. Feels does not have time to exercise.  Are you sexually active? N/A Number of partners? N/A Typical breakfast: Cereal  They eat all prepared food because his wife is sick. Feels is doing ok.  Typical lunch: Varies Typical dinner Varies  Typical snacks: N/A   Beverages: Chocolate milk, Coke   Answer theses question about you: Can you perform most household chores? Depends Do you find it hard to follow a conversation in a noisy room? Yes Do you often ask people to speak up or repeat themselves? Yes Do you feel that you have a problem with memory? Yes Do you balance your checkbook and or bank acounts? Yes Do you feel safe at home? Yes Last dentist visit? 4 years ago  Do you need assistance with any of the following: Please note if so No  Driving?  Feeding yourself?  Getting from bed to chair?  Getting to the toilet?  Bathing or showering?  Dressing yourself?  Managing money?  Climbing a flight of stairs Yes - no  stairs in house  Preparing meals?    Do you have Advanced Directives in place (Living Will, Healthcare Power or Attorney)? Yes   [facility]    Do you currently use prescribed or non-prescribed narcotic or opioid pain medications? No  Do you have a history or close family history of breast, ovarian, tubal or peritoneal cancer or a family member with BRCA (breast cancer susceptibility 1 and 2) gene mutations? No   Nurse/Assistant Credentials/time stamp: MG 4:37 PM    ----------------------------------------------------------------------------------------------------------------------------------------------------------------------------------------------------------------------    MEDICARE ANNUAL PREVENTIVE CARE VISIT WITH PROVIDER (Welcome to Medicare, initial annual wellness or annual wellness exam)  Virtual Visit via Phone Note  I connected with Isaiah Wilson on 10/11/22  by phone and verified that I am speaking with the correct person using two identifiers.  Location patient: home Location provider:work or home office Persons participating in the virtual visit: patient, provider  Concerns and/or follow up today: reports everything is ok - seeing cardiologist and ENT about dizziness that started after a 4 hour operation on his ear. Reports cardiologist has checked him for this and all was ok.    See HM section in Epic for other details of completed HM.    ROS: negative for report of fevers, unintentional weight loss, vision changes, vision loss, or change, chest pain, sob (unchanged chronic with his weight), hemoptysis, melena, hematochezia, hematuria, falls, bleeding or bruising, thoughts of suicide or self harm, memory loss  Patient-completed extensive health risk assessment - reviewed and discussed with the patient: See Health Risk Assessment completed with patient prior to the visit either above or in recent phone note. This was  reviewed in detailed with the patient today and appropriate recommendations, orders and referrals were placed as needed per Summary below and patient instructions.   Review of Medical History: -PMH, PSH, Family History and current specialty and care providers reviewed and updated and listed below   Patient Care Team: Kristian Covey, MD as PCP - General (Family Medicine) [name], MD as PCP - Cardiology (Cardiology)   Past Medical History:  Diagnosis Date   Abnormal EKG    hx left anterior fasicular block on old ekg 2017   Allergic rhinitis    Allergy    Barrett's esophagus    Benign prostatic hypertrophy    Colitis, ischemic (HCC)    X 2   Complication of anesthesia    SLOW TO AWAKEN AFTER 1 SURGERY 20  YRS AGO   DJD (degenerative joint disease)    SPINE, AND OA   GERD (gastroesophageal reflux disease)    H/O hiatal hernia    HOH (hard of hearing)    BOTH EARS   Hyperlipidemia    no medication per pt.   Hypertension    pt denies says cardura for bladder   Neuropathy     LEFT HAND AND ARM NUMB ALL THE TIME   Overweight(278.02)     Past Surgical History:  Procedure Laterality Date   2 neck surgeries  2005-2006   Dr. Georgia Dom cx decompression, diskectomy,fusion C6-7 allograftWITH PLATE   APPENDECTOMY     EYE SURGERY     bilateral cataract surgerywith lens implant   FOOT SURGERY     right x 3   INNER EAR SURGERY Right    07/2020   LAPAROSCOPIC CHOLECYSTECTOMY  1993   Dr. Luan Moore   left total knee replacement  07/2007   Dr. Eulah Pont    right total knee replacement  2012   Dr. Romie Jumper  1975, May 2013   x3   TONSILLECTOMY  as child   TOTAL HIP ARTHROPLASTY Left 04/18/2018   Procedure: LEFT TOTAL HIP ARTHROPLASTY ANTERIOR APPROACH;  Surgeon: Ollen Gross, MD;  Location: [facility];  Service: Orthopedics;  Laterality: Left;   TOTAL HIP ARTHROPLASTY Right 03/13/2019   Procedure: TOTAL HIP ARTHROPLASTY ANTERIOR APPROACH;  Surgeon: Ollen Gross, MD;  Location: [facility];  Service: Orthopedics;  Laterality: Right;    TOTAL KNEE REVISION Left 11/11/2015   Procedure: LEFT KNEE POLYETHELENE REVISION;  Surgeon: Ollen Gross, MD;  Location: [facility];  Service: Orthopedics;  Laterality: Left;   TOTAL KNEE REVISION Left 12/07/2016   Procedure: LEFT TOTAL KNEE REVISION;  Surgeon: Ollen Gross, MD;  Location: [facility];  Service: Orthopedics;  Laterality: Left;  Adductor Block    Social History   Socioeconomic History   Marital status: Married    Spouse name: Not on file   Number of children: 2   Years of education: some college   Highest education level: Not on file  Occupational History   Occupation: retired  Tobacco Use   Smoking status: Never   Smokeless tobacco: Never  Vaping Use   Vaping Use: Never used  Substance and Sexual Activity   Alcohol use: No    Alcohol/week: 0.0 standard drinks of alcohol   Drug use: No   Sexual activity: Not on file  Other Topics  Concern   Not on file  Social History Narrative   Married   HH 2   2 children; 3 granchildren; 2 great grandchildren   Social Determinants of Health   Financial Resource Strain: Low Risk  (10/11/2022)   Overall Financial Resource Strain (CARDIA)    Difficulty of Paying Living Expenses: Not hard at all  Food Insecurity: No Food Insecurity (10/11/2022)   Hunger Vital Sign    Worried About Running Out of Food in the Last Year: Never true    Ran Out of Food in the Last Year: Never true  Transportation Needs: No Transportation Needs (10/11/2022)   PRAPARE - Administrator, Civil Service (Medical): No    Lack of Transportation (Non-Medical): No  Physical Activity: Inactive (05/03/2021)   Exercise Vital Sign    Days of Exercise per Week: 0 days    Minutes of Exercise per Session: 0 min  Stress: Stress Concern Present (05/03/2021)   [employer] of Occupational Health - Occupational Stress Questionnaire    Feeling of Stress : To some extent   Social Connections: Socially Integrated (06/11/2019)   Social Connection and Isolation Panel [NHANES]    Frequency of Communication with Friends and Family: More than three times a week    Frequency of Social Gatherings with Friends and Family: Once a week    Attends Religious Services: More than 4 times per year    Active Member of [employer] or Organizations: Yes    Attends Engineer, structural: More than 4 times per year    Marital Status: Married  Catering manager Violence: Not on file    Family History  Problem Relation Age of Onset   Leukemia Mother    Heart disease Father    Heart disease Sister    Colon cancer Neg Hx    Stomach cancer Neg Hx    Rectal cancer Neg Hx    Esophageal cancer Neg Hx     Current Outpatient Medications on File Prior to Visit  Medication Sig Dispense Refill   acetaminophen (TYLENOL) 650 MG CR tablet Take 650 mg by mouth every 8 (eight) hours as needed for pain.     amLODipine (NORVASC) 5 MG tablet TAKE ONE TABLET BY MOUTH DAILY 90 tablet 3   aspirin EC 81 MG tablet Take 81 mg by mouth daily.     azelastine (ASTELIN) 0.1 % nasal spray Place 1 spray into both nostrils 2 (two) times daily as needed (allergies.).      doxazosin (CARDURA) 8 MG tablet TAKE ONE TABLET BY MOUTH EVERY NIGHT AT BEDTIME 60 tablet 2   EPINEPHrine 0.3 mg/0.3 mL IJ SOAJ injection Inject 0.3 mg into the muscle as needed (allergic reaction).      esomeprazole (NEXIUM) 40 MG capsule TAKE ONE CAPSULE BY MOUTH DAILY (TAKE ON AN EMPTY STOMACH 30 MINUTES PRIOR TO A MEAL) FOR BARRETT'S ESOPHAGUS 90 capsule 0   fluticasone (FLONASE) 50 MCG/ACT nasal spray Place 2 sprays into both nostrils daily.      loratadine (CLARITIN) 10 MG tablet      losartan (COZAAR) 100 MG tablet TAKE 1 TABLET BY MOUTH DAILY 90 tablet 0   montelukast (SINGULAIR) 10 MG tablet TAKE ONE TABLET BY MOUTH EVERY NIGHT AT BEDTIME 90 tablet 0   mupirocin ointment (BACTROBAN) 2 %      rosuvastatin (CRESTOR) 20 MG  tablet TAKE ONE TABLET BY MOUTH DAILY 90 tablet 3   zinc gluconate 50 MG tablet Take 50 mg  by mouth daily.     vancomycin (VANCOCIN) 1 g injection  (Patient not taking: Reported on 10/11/2022)     No current facility-administered medications on file prior to visit.    No Known Allergies     Physical Exam There were no vitals filed for this visit. Estimated body mass index is 35.57 kg/m as calculated from the following:   Height as of this encounter: 5\' 11"  (1.803 m).   Weight as of this encounter: 255 lb (115.7 kg).  EKG (optional): deferred due to virtual visit  GENERAL: alert, oriented, no acute distress detected; full vision exam deferred due to pandemic and/or virtual encounter  PSYCH/NEURO: pleasant and cooperative, no obvious depression or anxiety, speech and thought processing grossly intact, Cognitive function grossly intact  Flowsheet Row Office Visit from 10/11/2022 in [facility]  PHQ-9 Total Score 9           10/11/2022    4:30 PM 05/03/2021   10:01 AM 06/11/2019    3:31 PM 05/19/2019   10:02 AM 02/07/2018    9:20 AM  Depression screen PHQ 2/9  Decreased Interest 0 0 0 0 0  Down, Depressed, Hopeless 0 0 0 1 0  PHQ - 2 Score 0 0 0 1 0  Altered sleeping 3      Tired, decreased energy 3      Change in appetite 3      Feeling bad or failure about yourself  0      Trouble concentrating 0      Moving slowly or fidgety/restless 0      Suicidal thoughts 0      PHQ-9 Score 9      Difficult doing work/chores Not difficult at all       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 Tired a lot - but does not feels depressed. Just having a day today with energy as did not sleep well. Reports often doesn't sleep well - has lots of OA and the pain disturbs sleep. He has worked with his doctors on this - using tylenol.      05/19/2019   10:03 AM 06/11/2019    3:31 PM 08/18/2019    5:52 PM 05/03/2021    9:59 AM 10/11/2022    4:31 PM  Fall Risk   Falls in the past year? 0 0  1 1  Was there an injury with Fall?    0 1  Fall Risk Category Calculator    1 3  Fall Risk Category (Retired)    Low   (RETIRED) Patient Fall Risk Level Moderate fall risk Low fall risk Low fall risk Moderate fall risk   Patient at Risk for Falls Due to Orthopedic patient Medication side effect  Impaired balance/gait;Medication side effect No Fall Risks  Fall risk Follow up  Falls prevention discussed;Education provided;Falls evaluation completed  Falls evaluation completed;Education provided;Falls prevention discussed Falls evaluation completed  He uses a cane or walker. Does not do any balance exercises - did do some physical therapy.   SUMMARY AND PLAN:  Encounter for Medicare annual wellness exam  Discussed applicable health maintenance/preventive health measures and advised and referred or ordered per patient preferences:  Discussed vaccines due, recs and options  Health Maintenance  Topic Date Due   DTaP/Tdap/Td (2 - Tdap) 06/23/2019   COVID-19 Vaccine (7 - 2023-24 season) 05/31/2022   INFLUENZA VACCINE  12/29/2022   Medicare Annual Wellness (AWV)  10/11/2023   Pneumonia Vaccine  45+ Years old  Completed   Zoster Vaccines- Shingrix  Completed   HPV VACCINES  Aged [employer] and counseling on the following was provided based on the above review of health and a plan/checklist for the patient, along with additional information discussed, was provided for the patient in the patient instructions :  -Provided counseling and plan for increased risk of falling if applicable per above screening.He has had PT. He is seeing ENT and Cardiology. He did not feel that balance exercises helped. Discussed safe easy exercises could do at home. Advised caution and using cane/walker - he has both.  -discussed at length options for pain and sleep, he does not wish to have evaluation for the pain or further tx - feels has lived a long life and doesn't want more tests  as doesn't want procedures/surgery/etc. All reasonable.  -Advised and counseled on a healthy lifestyle - including the importance of a healthy diet, regular physical activity, social connections and stress management. -Reviewed patient's current diet. Advised and counseled on ways to eat healthier when eating out/picking up food and keeping healthy snacks/fruit/etc.  -reviewed patient's current physical activity level and discussed exercise guidelines for adults. Discussed ideas for safe exercise at home to assist in meeting exercise guideline recommendations in a safe and healthy way. He is in pre contemplation phase and does not feel can add any exercise at this time due to other obligations and health challenges.  -Advise yearly dental visits at minimum and regular eye exams   Follow up: see patient instructions   Patient Instructions  I really enjoyed getting to talk with you today! I am available on Tuesdays and Thursdays for virtual visits if you have any questions or concerns, or if I can be of any further assistance.   CHECKLIST FROM ANNUAL WELLNESS VISIT:  -Follow up (please call to schedule if not scheduled after visit):   -yearly for annual wellness visit with primary care office  Here is a list of your preventive care/health maintenance measures and the plan for each if any are due:  PLAN For any measures below that may be due:   Health Maintenance  Topic Date Due   DTaP/Tdap/Td (2 - Tdap) 06/23/2019   COVID-19 Vaccine (7 - 2023-24 season) 05/31/2022   INFLUENZA VACCINE  12/29/2022   Medicare Annual Wellness (AWV)  10/11/2023   Pneumonia Vaccine 80+ Years old  Completed   Zoster Vaccines- Shingrix  Completed   HPV VACCINES  Aged Out    -See a dentist at least yearly  -Get your eyes checked and then per your eye specialist's recommendations  -Other issues addressed today:   -I have included below further information regarding a healthy whole foods based diet,  physical activity guidelines for adults, stress management and opportunities for social connections. I hope you find this information useful.   -----------------------------------------------------------------------------------------------------------------------------------------------------------------------------------------------------------------------------------------------------------  NUTRITION: -eat real food: lots of colorful vegetables (half the plate) and fruits -5-7 servings of vegetables and fruits per day (fresh or steamed is best), exp. 2 servings of vegetables with lunch and dinner and 2 servings of fruit per day. Berries and greens such as kale and collards are great choices.  -consume on a regular basis: whole grains (make sure first ingredient on label contains the word "whole"), fresh fruits, fish, nuts, seeds, healthy oils (such as olive oil, avocado oil, grape seed oil) -may eat small amounts of dairy and lean meat on occasion, but avoid processed meats such as ham, bacon, lunch  meat, etc. -drink water -try to avoid fast food and pre-packaged foods, processed meat -most experts advise limiting sodium to < 2300mg  per day, should limit further is any chronic conditions such as high blood pressure, heart disease, diabetes, etc. The American Heart Association advised that < 1500mg  is is ideal -try to avoid foods that contain any ingredients with names you do not recognize  -try to avoid sugar/sweets (except for the natural sugar that occurs in fresh fruit) -try to avoid sweet drinks -try to avoid white rice, white bread, pasta (unless whole grain), white or yellow potatoes  EXERCISE GUIDELINES FOR ADULTS: -if you wish to increase your physical activity, do so gradually and with the approval of your doctor -STOP and seek medical care immediately if you have any chest pain, chest discomfort or trouble breathing when starting or increasing exercise  -move and stretch your body,  legs, feet and arms when sitting for long periods -Physical activity guidelines for optimal health in adults: -least 150 minutes per week of aerobic exercise (can talk, but not sing) once approved by your doctor, 20-30 minutes of sustained activity or two 10 minute episodes of sustained activity every day.  -resistance training at least 2 days per week if approved by your doctor -balance exercises 3+ days per week:   Stand somewhere where you have something sturdy to hold onto if you lose balance.    1) lift up on toes, start with 5x per day and work up to 20x   2) stand and lift on leg straight out to the side so that foot is a few inches of the floor, start with 5x each side and work up to 20x each side   3) stand on one foot, start with 5 seconds each side and work up to 20 seconds on each side  If you need ideas or help with getting more active:  -Silver sneakers https://tools.silversneakers.com  -Walk with a Doc: http://www.duncan-williams.com/  -try to include resistance (weight lifting/strength building) and balance exercises twice per week: or the following link for ideas: http://castillo-powell.com/  BuyDucts.dk  STRESS MANAGEMENT: -can try meditating, or just sitting quietly with deep breathing while intentionally relaxing all parts of your body for 5 minutes daily -if you need further help with stress, anxiety or depression please follow up with your primary doctor or contact the wonderful folks at [facility]: [phone]  SOCIAL CONNECTIONS: -options in [place] if you wish to engage in more social and exercise related activities:  -Silver sneakers https://tools.silversneakers.com  -Walk with a Doc: http://www.duncan-williams.com/  -Check out the [facility] Active Adults 50+ section on the [place] [employer] (hiking clubs, book clubs, cards and games, chess, exercise  classes, aquatic classes and much more) - see the website for details: https://www.[place].gov/departments/parks-recreation/active-adults50  -YouTube has lots of exercise videos for different ages and abilities as well  -[facility] (a variety of indoor and outdoor inperson activities for adults). (509)665-0066. [address].  -Virtual Online Classes (a variety of topics): see seniorplanet.org or call 216-133-7293  -consider volunteering at a school, hospice center, church, senior center or elsewhere           Terressa Koyanagi, DO

## 2022-10-19 ENCOUNTER — Ambulatory Visit
Admission: RE | Admit: 2022-10-19 | Discharge: 2022-10-19 | Disposition: A | Discharge status: Home / Self Care | Payer: Medicare PPO | Source: Ambulatory Visit | Attending: Cardiology | Admitting: Cardiology

## 2022-10-19 DIAGNOSIS — I700 Atherosclerosis of aorta: Secondary | ICD-10-CM | POA: Diagnosis not present

## 2022-10-19 DIAGNOSIS — I712 Thoracic aortic aneurysm, without rupture, unspecified: Secondary | ICD-10-CM

## 2022-10-19 DIAGNOSIS — I2510 Atherosclerotic heart disease of native coronary artery without angina pectoris: Secondary | ICD-10-CM | POA: Diagnosis not present

## 2022-10-19 DIAGNOSIS — I7121 Aneurysm of the ascending aorta, without rupture: Secondary | ICD-10-CM | POA: Diagnosis not present

## 2022-10-19 MED ORDER — IOPAMIDOL (ISOVUE-370) INJECTION 76%
75.0000 mL | Freq: Once | INTRAVENOUS | Status: AC | PRN
  Administered 2022-10-19: 75 mL via INTRAVENOUS

## 2022-10-20 ENCOUNTER — Encounter: Payer: Self-pay | Admitting: *Deleted

## 2022-10-31 ENCOUNTER — Other Ambulatory Visit: Payer: Self-pay | Admitting: Family Medicine

## 2022-11-04 DIAGNOSIS — J301 Allergic rhinitis due to pollen: Secondary | ICD-10-CM | POA: Diagnosis not present

## 2022-11-04 DIAGNOSIS — J3081 Allergic rhinitis due to animal (cat) (dog) hair and dander: Secondary | ICD-10-CM | POA: Diagnosis not present

## 2022-11-04 DIAGNOSIS — J3089 Other allergic rhinitis: Secondary | ICD-10-CM | POA: Diagnosis not present

## 2022-11-07 ENCOUNTER — Other Ambulatory Visit: Payer: Self-pay | Admitting: Family Medicine

## 2022-11-09 DIAGNOSIS — J301 Allergic rhinitis due to pollen: Secondary | ICD-10-CM | POA: Diagnosis not present

## 2022-11-15 DIAGNOSIS — J301 Allergic rhinitis due to pollen: Secondary | ICD-10-CM | POA: Diagnosis not present

## 2022-11-15 DIAGNOSIS — J3089 Other allergic rhinitis: Secondary | ICD-10-CM | POA: Diagnosis not present

## 2022-11-15 DIAGNOSIS — J3081 Allergic rhinitis due to animal (cat) (dog) hair and dander: Secondary | ICD-10-CM | POA: Diagnosis not present

## 2022-11-29 DIAGNOSIS — J3081 Allergic rhinitis due to animal (cat) (dog) hair and dander: Secondary | ICD-10-CM | POA: Diagnosis not present

## 2022-11-29 DIAGNOSIS — J301 Allergic rhinitis due to pollen: Secondary | ICD-10-CM | POA: Diagnosis not present

## 2022-11-29 DIAGNOSIS — J3089 Other allergic rhinitis: Secondary | ICD-10-CM | POA: Diagnosis not present

## 2022-12-06 DIAGNOSIS — J3089 Other allergic rhinitis: Secondary | ICD-10-CM | POA: Diagnosis not present

## 2022-12-06 DIAGNOSIS — J3081 Allergic rhinitis due to animal (cat) (dog) hair and dander: Secondary | ICD-10-CM | POA: Diagnosis not present

## 2022-12-06 DIAGNOSIS — J301 Allergic rhinitis due to pollen: Secondary | ICD-10-CM | POA: Diagnosis not present

## 2022-12-12 ENCOUNTER — Ambulatory Visit: Payer: Medicare PPO | Admitting: Podiatry

## 2022-12-12 DIAGNOSIS — J301 Allergic rhinitis due to pollen: Secondary | ICD-10-CM | POA: Diagnosis not present

## 2022-12-12 DIAGNOSIS — J3081 Allergic rhinitis due to animal (cat) (dog) hair and dander: Secondary | ICD-10-CM | POA: Diagnosis not present

## 2022-12-12 DIAGNOSIS — J3089 Other allergic rhinitis: Secondary | ICD-10-CM | POA: Diagnosis not present

## 2022-12-16 DIAGNOSIS — H90A31 Mixed conductive and sensorineural hearing loss, unilateral, right ear with restricted hearing on the contralateral side: Secondary | ICD-10-CM | POA: Diagnosis not present

## 2022-12-16 DIAGNOSIS — R42 Dizziness and giddiness: Secondary | ICD-10-CM | POA: Diagnosis not present

## 2022-12-16 DIAGNOSIS — H90A22 Sensorineural hearing loss, unilateral, left ear, with restricted hearing on the contralateral side: Secondary | ICD-10-CM | POA: Diagnosis not present

## 2022-12-16 DIAGNOSIS — Z4589 Encounter for adjustment and management of other implanted devices: Secondary | ICD-10-CM | POA: Diagnosis not present

## 2022-12-16 DIAGNOSIS — H918X3 Other specified hearing loss, bilateral: Secondary | ICD-10-CM | POA: Diagnosis not present

## 2022-12-16 DIAGNOSIS — R2689 Other abnormalities of gait and mobility: Secondary | ICD-10-CM | POA: Diagnosis not present

## 2022-12-16 DIAGNOSIS — H6993 Unspecified Eustachian tube disorder, bilateral: Secondary | ICD-10-CM | POA: Diagnosis not present

## 2022-12-16 DIAGNOSIS — Z9089 Acquired absence of other organs: Secondary | ICD-10-CM | POA: Diagnosis not present

## 2022-12-19 DIAGNOSIS — H8193 Unspecified disorder of vestibular function, bilateral: Secondary | ICD-10-CM | POA: Diagnosis not present

## 2022-12-19 DIAGNOSIS — H832X3 Labyrinthine dysfunction, bilateral: Secondary | ICD-10-CM | POA: Diagnosis not present

## 2022-12-19 IMAGING — CT CT ANGIO CHEST
2 of 6 series · 13 of 36 positions shown · IV contrast (iopamidol)
Comparison: 10/18/2019

CLINICAL DATA: 85-year-old male with a history of thoracic aortic
aneurysm

EXAM:
CT ANGIOGRAPHY CHEST WITH CONTRAST
TECHNIQUE: Multidetector CT imaging of the chest was performed using the
standard protocol during bolus administration of intravenous
contrast. Multiplanar CT image reconstructions and MIPs were
obtained to evaluate the vascular anatomy.
CONTRAST:  75mL SZ1IEP-J5G IOPAMIDOL (SZ1IEP-J5G) INJECTION 76%

[Series 6: cta thorax 2.00 bv36 s3 axial arterial · axial · arterial · 0.79mm/px · z∈[+1676,+1940]mm · 12 of 158 slices shown]
[im 13/158  lung]
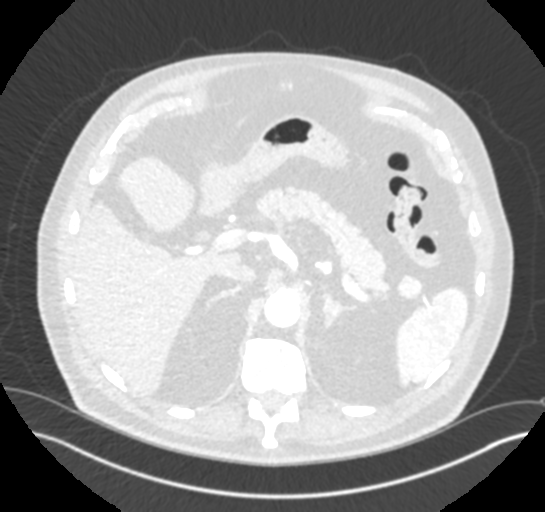
[im 25/158  mediastinal]
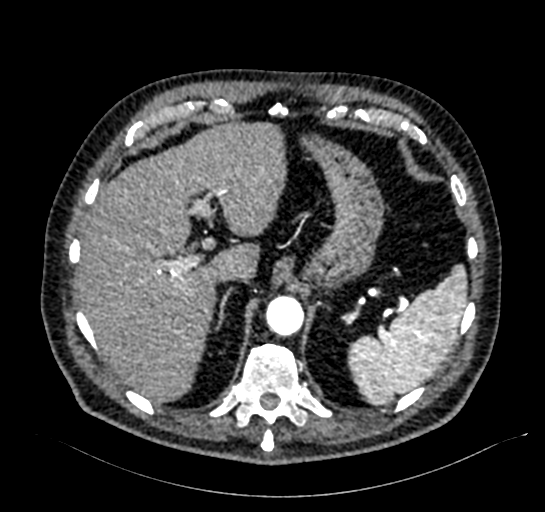
[im 37/158  lung]
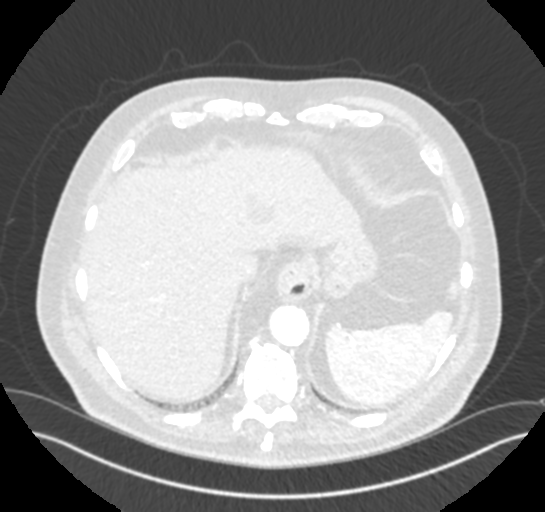
[im 49/158  mediastinal]
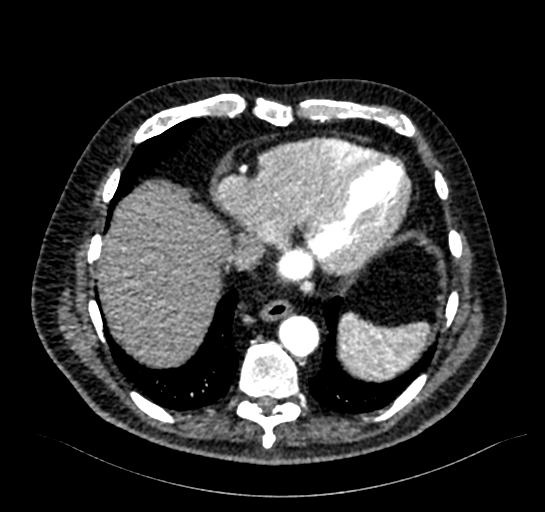
[im 61/158  lung]
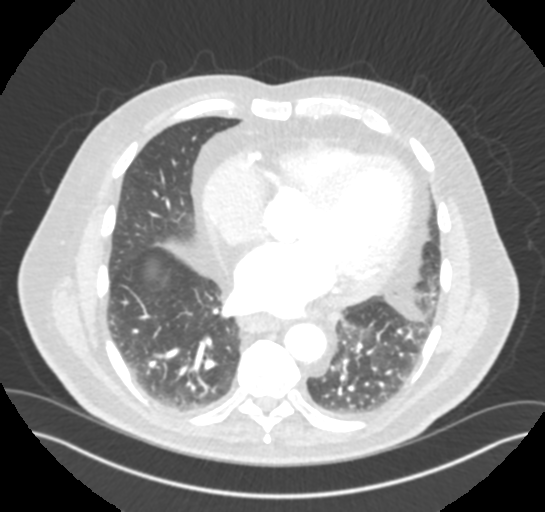
[im 73/158  mediastinal]
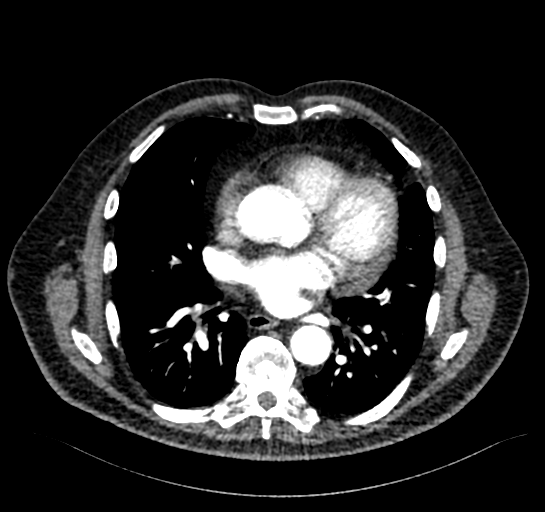
[im 85/158  lung]
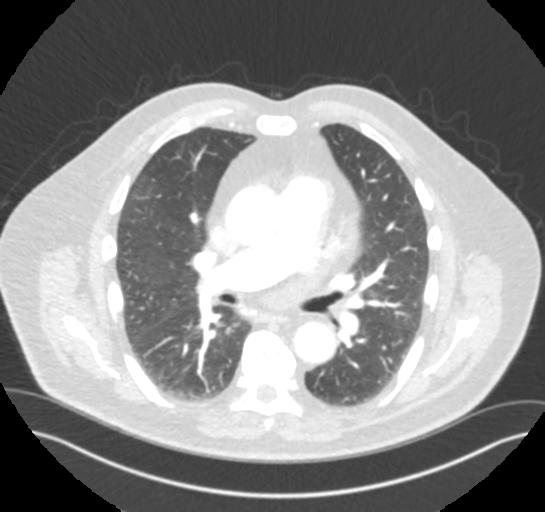
[im 97/158  mediastinal]
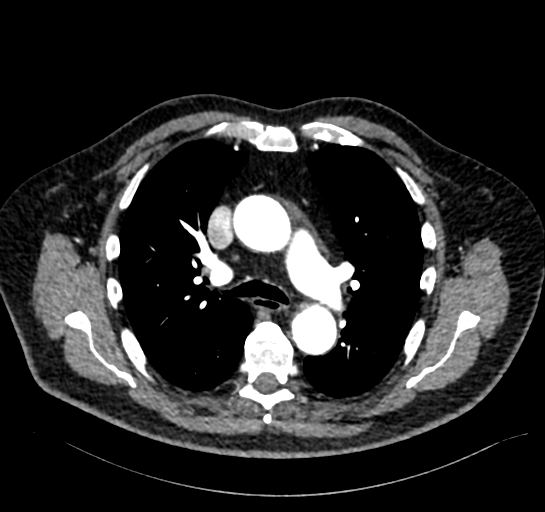
[im 109/158  lung]
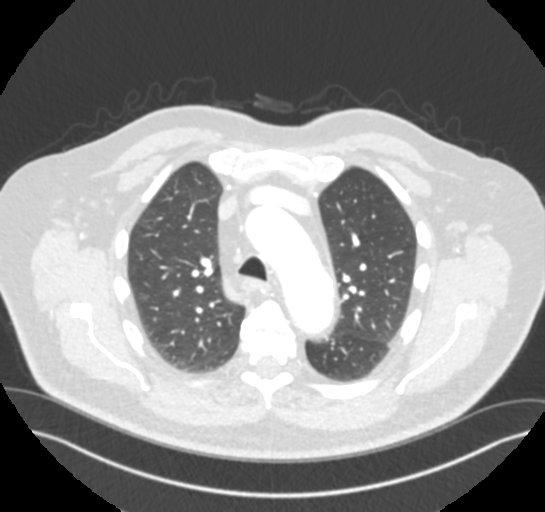
[im 121/158  mediastinal]
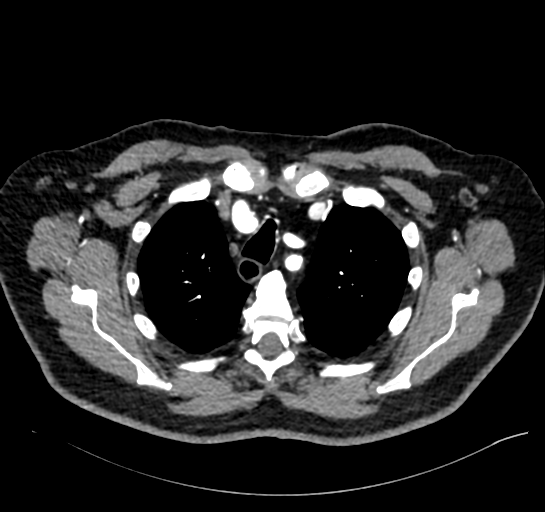
[im 133/158  lung]
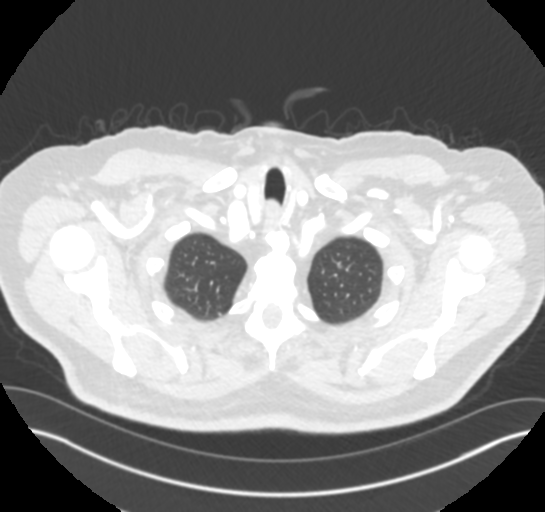
[im 145/158  mediastinal]
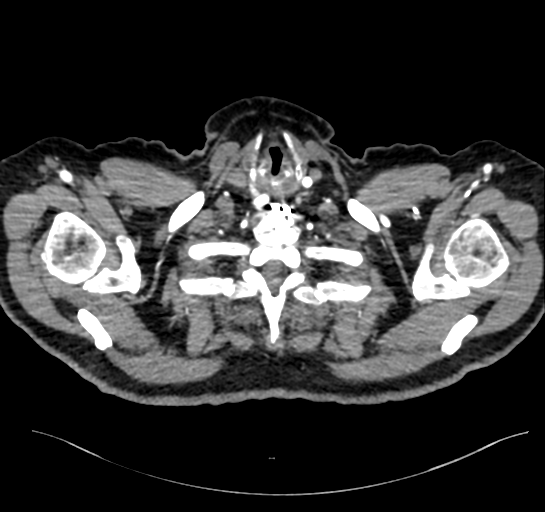

[Series 11: cta thorax 2.00 bv36 s3 cor st · coronal · 0.62mm/px · 1 of 202 slices shown]
[im 101/202  mediastinal]
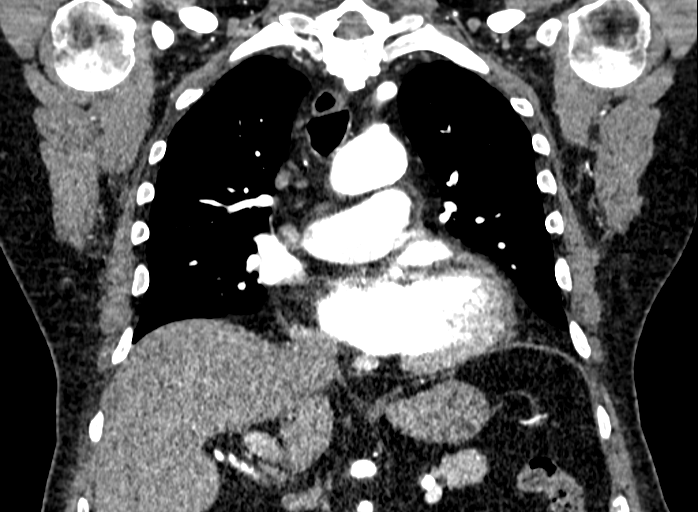

[13 of 36 positions shown; findings below may reference images not displayed]

FINDINGS: Cardiovascular:

Heart:

Heart size unchanged with cardiomegaly. No pericardial
fluid/thickening. Calcifications of left main, left anterior
descending, right coronary arteries.

Aorta:

No significant aortic valve calcifications. Greatest estimated
diameter of the ascending aorta is essentially unchanged from the
prior on this non gated study, approximately 4.2 cm.

Minimal atherosclerosis of the aortic arch. Three vessel arch.
Branch vessels are patent. Cervical cerebral arteries are patent.

No dissection.  No periaortic fluid.

Pulmonary arteries:

Timing of the contrast bolus is not optimized for evaluation of
pulmonary artery filling defects. Diameter of the main pulmonary
artery within normal limits.

Mediastinum/Nodes: Small lymph nodes, unchanged from the prior.
Unremarkable appearance of the thoracic esophagus.

Hiatal hernia.  Unremarkable thoracic inlet.

Lungs/Pleura: Central airways are clear. No pleural effusion. No
confluent airspace disease.

No pneumothorax.  Mild bronchial wall thickening.

Upper Abdomen: No acute. Redemonstration of low-density lesion
within the left liver, segment 2, compatible with cyst.

Musculoskeletal: Degenerative changes of the spine. Kyphotic
deformity is similar to prior. No acute displaced fracture. No bony
canal narrowing. Cervical changes of the cervical region.

Review of the MIP images confirms the above findings.

.
IMPRESSION: Essentially unchanged size/appearance of ascending aorta, estimated
4.2 cm on the current CT angiogram. Recommend annual imaging
followup by CTA or MRA. This recommendation follows 6565
ACCF/AHA/AATS/ACR/ASA/SCA/MATEO/TARQUINO/FITZGERALD/RINI Guidelines for the
Diagnosis and Management of Patients with Thoracic Aortic Disease.
Circulation. 6565; 121: E266-e369. Aortic aneurysm NOS (77FFA-KPZ.X)

Aortic Atherosclerosis (77FFA-J0R.R).

## 2022-12-21 DIAGNOSIS — J3081 Allergic rhinitis due to animal (cat) (dog) hair and dander: Secondary | ICD-10-CM | POA: Diagnosis not present

## 2022-12-21 DIAGNOSIS — J301 Allergic rhinitis due to pollen: Secondary | ICD-10-CM | POA: Diagnosis not present

## 2022-12-21 DIAGNOSIS — J3089 Other allergic rhinitis: Secondary | ICD-10-CM | POA: Diagnosis not present

## 2022-12-28 DIAGNOSIS — J301 Allergic rhinitis due to pollen: Secondary | ICD-10-CM | POA: Diagnosis not present

## 2022-12-28 DIAGNOSIS — J3081 Allergic rhinitis due to animal (cat) (dog) hair and dander: Secondary | ICD-10-CM | POA: Diagnosis not present

## 2022-12-28 DIAGNOSIS — J3089 Other allergic rhinitis: Secondary | ICD-10-CM | POA: Diagnosis not present

## 2023-01-02 DIAGNOSIS — J301 Allergic rhinitis due to pollen: Secondary | ICD-10-CM | POA: Diagnosis not present

## 2023-01-02 DIAGNOSIS — J3081 Allergic rhinitis due to animal (cat) (dog) hair and dander: Secondary | ICD-10-CM | POA: Diagnosis not present

## 2023-01-02 DIAGNOSIS — J3089 Other allergic rhinitis: Secondary | ICD-10-CM | POA: Diagnosis not present

## 2023-01-13 DIAGNOSIS — J3081 Allergic rhinitis due to animal (cat) (dog) hair and dander: Secondary | ICD-10-CM | POA: Diagnosis not present

## 2023-01-13 DIAGNOSIS — J301 Allergic rhinitis due to pollen: Secondary | ICD-10-CM | POA: Diagnosis not present

## 2023-01-13 DIAGNOSIS — J3089 Other allergic rhinitis: Secondary | ICD-10-CM | POA: Diagnosis not present

## 2023-01-17 ENCOUNTER — Other Ambulatory Visit: Payer: Self-pay | Admitting: Family Medicine

## 2023-01-17 DIAGNOSIS — J3089 Other allergic rhinitis: Secondary | ICD-10-CM | POA: Diagnosis not present

## 2023-01-17 DIAGNOSIS — J301 Allergic rhinitis due to pollen: Secondary | ICD-10-CM | POA: Diagnosis not present

## 2023-01-17 DIAGNOSIS — J3081 Allergic rhinitis due to animal (cat) (dog) hair and dander: Secondary | ICD-10-CM | POA: Diagnosis not present

## 2023-01-23 ENCOUNTER — Other Ambulatory Visit: Payer: Self-pay | Admitting: Cardiology

## 2023-01-23 DIAGNOSIS — H1045 Other chronic allergic conjunctivitis: Secondary | ICD-10-CM | POA: Diagnosis not present

## 2023-01-23 DIAGNOSIS — J3089 Other allergic rhinitis: Secondary | ICD-10-CM | POA: Diagnosis not present

## 2023-01-23 DIAGNOSIS — J301 Allergic rhinitis due to pollen: Secondary | ICD-10-CM | POA: Diagnosis not present

## 2023-01-23 DIAGNOSIS — J3081 Allergic rhinitis due to animal (cat) (dog) hair and dander: Secondary | ICD-10-CM | POA: Diagnosis not present

## 2023-01-23 DIAGNOSIS — E785 Hyperlipidemia, unspecified: Secondary | ICD-10-CM

## 2023-01-24 ENCOUNTER — Ambulatory Visit: Payer: Medicare PPO | Admitting: Podiatry

## 2023-01-24 DIAGNOSIS — D689 Coagulation defect, unspecified: Secondary | ICD-10-CM | POA: Diagnosis not present

## 2023-01-24 DIAGNOSIS — M79674 Pain in right toe(s): Secondary | ICD-10-CM | POA: Diagnosis not present

## 2023-01-24 DIAGNOSIS — L84 Corns and callosities: Secondary | ICD-10-CM | POA: Diagnosis not present

## 2023-01-24 DIAGNOSIS — M79675 Pain in left toe(s): Secondary | ICD-10-CM

## 2023-01-24 DIAGNOSIS — B351 Tinea unguium: Secondary | ICD-10-CM

## 2023-01-24 NOTE — Progress Notes (Signed)
Subjective:   Patient ID: Isaiah Wilson, male   DOB: 88 y.o.   MRN: [id number]   HPI Patient presents with keratotic lesions subfirst metatarsal both feet that are painful and nail disease 1-5 both feet that are thickened yellow brittle and has pain associated with these and is on blood thinner   ROS      Objective:  Physical Exam  High risk patient with keratotic lesion bilateral painful that he cannot cut himself in nail disease with mycotic component 1-5 both feet      Assessment:  Chronic nails chronic lesions bilateral     Plan:  Debridement of nailbeds and lesions bilateral with no iatrogenic bleeding repeat as needed

## 2023-01-25 DIAGNOSIS — Z96651 Presence of right artificial knee joint: Secondary | ICD-10-CM | POA: Diagnosis not present

## 2023-01-25 DIAGNOSIS — I10 Essential (primary) hypertension: Secondary | ICD-10-CM | POA: Diagnosis not present

## 2023-01-25 DIAGNOSIS — M545 Low back pain, unspecified: Secondary | ICD-10-CM | POA: Diagnosis not present

## 2023-01-25 DIAGNOSIS — Z96652 Presence of left artificial knee joint: Secondary | ICD-10-CM | POA: Diagnosis not present

## 2023-01-31 ENCOUNTER — Ambulatory Visit: Payer: Medicare PPO | Admitting: Cardiology

## 2023-01-31 ENCOUNTER — Ambulatory Visit: Payer: Medicare PPO | Admitting: General Practice

## 2023-01-31 DIAGNOSIS — J301 Allergic rhinitis due to pollen: Secondary | ICD-10-CM | POA: Diagnosis not present

## 2023-02-03 ENCOUNTER — Other Ambulatory Visit: Payer: Self-pay | Admitting: Family Medicine

## 2023-02-07 DIAGNOSIS — J3081 Allergic rhinitis due to animal (cat) (dog) hair and dander: Secondary | ICD-10-CM | POA: Diagnosis not present

## 2023-02-07 DIAGNOSIS — J301 Allergic rhinitis due to pollen: Secondary | ICD-10-CM | POA: Diagnosis not present

## 2023-02-07 DIAGNOSIS — J3089 Other allergic rhinitis: Secondary | ICD-10-CM | POA: Diagnosis not present

## 2023-02-13 ENCOUNTER — Other Ambulatory Visit: Payer: Self-pay | Admitting: Family Medicine

## 2023-02-13 NOTE — Progress Notes (Deleted)
Cardiology Clinic Note   Patient Name: Isaiah Wilson Date of Encounter: 02/13/2023  Primary Care Provider:  Kristian Covey, MD Primary Cardiologist:  Olga Millers, MD  Patient Profile    Isaiah Wilson 88 year old male presents the clinic today for follow-up evaluation of his essential hypertension and hyperlipidemia.  Past Medical History    Past Medical History:  Diagnosis Date   Abnormal EKG    hx left anterior fasicular block on old ekg 2017   Allergic rhinitis    Allergy    Barrett's esophagus    Benign prostatic hypertrophy    Colitis, ischemic (HCC)    X 2   Complication of anesthesia    SLOW TO AWAKEN AFTER 1 SURGERY 20  YRS AGO   DJD (degenerative joint disease)    SPINE, AND OA   GERD (gastroesophageal reflux disease)    H/O hiatal hernia    HOH (hard of hearing)    BOTH EARS   Hyperlipidemia    no medication per pt.   Hypertension    pt denies says cardura for bladder   Neuropathy     LEFT HAND AND ARM NUMB ALL THE TIME   Overweight(278.02)    Past Surgical History:  Procedure Laterality Date   2 neck surgeries  2005-2006   Dr. Georgia Dom cx decompression, diskectomy,fusion C6-7 allograftWITH PLATE   APPENDECTOMY     EYE SURGERY     bilateral cataract surgerywith lens implant   FOOT SURGERY     right x 3   INNER EAR SURGERY Right    07/2020   LAPAROSCOPIC CHOLECYSTECTOMY  1993   Dr. Luan Moore   left total knee replacement  07/2007   Dr. Eulah Pont    right total knee replacement  2012   Dr. Romie Jumper  1975, May 2013   x3   TONSILLECTOMY  as child   TOTAL HIP ARTHROPLASTY Left 04/18/2018   Procedure: LEFT TOTAL HIP ARTHROPLASTY ANTERIOR APPROACH;  Surgeon: Ollen Gross, MD;  Location: [facility];  Service: Orthopedics;  Laterality: Left;   TOTAL HIP ARTHROPLASTY Right 03/13/2019   Procedure: TOTAL HIP ARTHROPLASTY ANTERIOR APPROACH;  Surgeon: Ollen Gross, MD;  Location: [facility];  Service: Orthopedics;  Laterality: Right;     TOTAL KNEE REVISION Left 11/11/2015   Procedure: LEFT KNEE POLYETHELENE REVISION;  Surgeon: Ollen Gross, MD;  Location: [facility];  Service: Orthopedics;  Laterality: Left;   TOTAL KNEE REVISION Left 12/07/2016   Procedure: LEFT TOTAL KNEE REVISION;  Surgeon: Ollen Gross, MD;  Location: [facility];  Service: Orthopedics;  Laterality: Left;  Adductor Block    Allergies  No Known Allergies  History of Present Illness    DEKON TANN has a PMH of HLD, HTN, thoracic aortic aneurysm, and coronary artery disease.  He had a brain MRI 3/21 which showed moderate chronic microvascular ischemic changes.  His echocardiogram 5/21 showed normal LV function, severe LVH, G2 DD, left atrial enlargement, mild aortic insufficiency, and dilated ascending aorta measuring 40 felt more millimeters.  He underwent nuclear stress test 5/21 which showed an EF of 62% no ischemia.  He had a CTA 5/22 which showed a stable aortic aneurysm measuring 4.2 cm and atherosclerosis including his coronary arteries and aorta.  He had repeat CT angio chest 10/08/2021 which showed stable ascending aortic aneurysm measuring 4.2 cm.  He was seen in follow-up by Dr. Jens Som on 12/09/2020.  During that time reported no dyspnea or chest pain.  He did note dizziness with standing which should improve with sitting.  He felt his dizziness was related to his ear.  He presented to the clinic 12/06/21 for follow-up evaluation and stated he felt tired d/t caring for his spouse.  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  He was somewhat limited in his physical activity due to back pain.  We reviewed his most recent CT and he expressed understanding.  His EKG showed sinus bradycardia with first-degree AV block 54 bpm.  We planned repeat CT angio chest in 1 year, contact [facility] to reach out about respite care, and planned follow-up in 1 year.  He presents to the clinic today for follow-up evaluation  and states***.    Today he denies chest pain, shortness of breath, lower extremity edema, fatigue, palpitations, melena, hematuria, hemoptysis, diaphoresis, weakness, presyncope, syncope, orthopnea, and PND.   Home Medications    Prior to Admission medications   Medication Sig Start Date End Date Taking? Authorizing Provider  acetaminophen (TYLENOL) 650 MG CR tablet Take 650 mg by mouth every 8 (eight) hours as needed for pain.    [provider]  amLODipine (NORVASC) 5 MG tablet TAKE ONE TABLET BY MOUTH DAILY 01/08/21   [name], MD  aspirin EC 81 MG tablet Take 81 mg by mouth daily.    [provider]  azelastine (ASTELIN) 0.1 % nasal spray Place 1 spray into both nostrils 2 (two) times daily as needed (allergies.).     [provider]  clobetasol (TEMOVATE) 0.05 % external solution     [provider]  doxazosin (CARDURA) 8 MG tablet Take 1 tablet (8 mg total) by mouth at bedtime. 11/24/21   Burchette, Elberta Fortis, MD  EPINEPHrine 0.3 mg/0.3 mL IJ SOAJ injection Inject 0.3 mg into the muscle as needed (allergic reaction).     [provider]  esomeprazole (NEXIUM) 40 MG capsule TAKE ONE CAPSULE BY MOUTH DAILY (TAKE ON AN EMPTY STOMACH 30 MINUTES PRIOR TO A MEAL) FOR BARRETT'S ESOPHAGUS 06/17/21   Hilarie Fredrickson, MD  fluticasone [facility]) 50 MCG/ACT nasal spray Place 2 sprays into both nostrils daily.     [provider]  loratadine (CLARITIN) 10 MG tablet     [provider]  losartan (COZAAR) 100 MG tablet TAKE ONE TABLET BY MOUTH DAILY 08/31/21   Burchette, Elberta Fortis, MD  montelukast (SINGULAIR) 10 MG tablet TAKE ONE TABLET BY MOUTH EVERY NIGHT AT BEDTIME 10/11/21   Burchette, Elberta Fortis, MD  mupirocin ointment (BACTROBAN) 2 %  12/26/19   [provider]  rosuvastatin (CRESTOR) 40 MG tablet TAKE ONE-HALF TABLET BY MOUTH ONCE A DAY 02/20/21   [provider]  tobramycin-dexamethasone Wallene Dales) ophthalmic solution  Apply to eye. 12/26/19   [provider]  vancomycin (VANCOCIN) 1 g injection     [provider]  zinc gluconate 50 MG tablet Take 50 mg by mouth daily.    [provider]    Family History    Family History  Problem Relation Age of Onset   Leukemia Mother    Heart disease Father    Heart disease Sister    Colon cancer Neg Hx    Stomach cancer Neg Hx    Rectal cancer Neg Hx    Esophageal cancer Neg Hx    He indicated that his mother is deceased. He indicated that his father is deceased. He indicated that his  sister is alive. He indicated that the status of his neg hx is unknown. He indicated that the status of his other is unknown and reported the following: sibling alive.  Social History    Social History   Socioeconomic History   Marital status: Married    Spouse name: Not on file   Number of children: 2   Years of education: some college   Highest education level: Not on file  Occupational History   Occupation: retired  Tobacco Use   Smoking status: Never   Smokeless tobacco: Never  Vaping Use   Vaping status: Never Used  Substance and Sexual Activity   Alcohol use: No    Alcohol/week: 0.0 standard drinks of alcohol   Drug use: No   Sexual activity: Not on file  Other Topics Concern   Not on file  Social History Narrative   Married   HH 2   2 children; 3 granchildren; 2 great grandchildren   Social Determinants of Health   Financial Resource Strain: Low Risk  (10/11/2022)   Overall Financial Resource Strain (CARDIA)    Difficulty of Paying Living Expenses: Not hard at all  Food Insecurity: Low Risk  (12/16/2022)   Received from [facility]   Hunger Vital Sign    Worried About Running Out of Food in the Last Year: Never true    Ran Out of Food in the Last Year: Never true  Transportation Needs: Not on file (12/16/2022)  Physical Activity: Inactive (05/03/2021)   Exercise Vital Sign    Days of Exercise per Week: 0 days    Minutes  of Exercise per Session: 0 min  Stress: Stress Concern Present (05/03/2021)   [employer] of Occupational Health - Occupational Stress Questionnaire    Feeling of Stress : To some extent  Social Connections: Socially Integrated (06/11/2019)   Social Connection and Isolation Panel [NHANES]    Frequency of Communication with Friends and Family: More than three times a week    Frequency of Social Gatherings with Friends and Family: Once a week    Attends Religious Services: More than 4 times per year    Active Member of [employer] or Organizations: Yes    Attends Engineer, structural: More than 4 times per year    Marital Status: Married  Catering manager Violence: Not on file     Review of Systems    General:  No chills, fever, night sweats or weight changes.  Cardiovascular:  No chest pain, dyspnea on exertion, edema, orthopnea, palpitations, paroxysmal nocturnal dyspnea. Dermatological: No rash, lesions/masses Respiratory: No cough, dyspnea Urologic: No hematuria, dysuria Abdominal:   No nausea, vomiting, diarrhea, bright red blood per rectum, melena, or hematemesis Neurologic:  No visual changes, wkns, changes in mental status. All other systems reviewed and are otherwise negative except as noted above.  Physical Exam    VS:  There were no vitals taken for this visit. , BMI There is no height or weight on file to calculate BMI. GEN: Well nourished, well developed, in no acute distress. HEENT: normal. Neck: Supple, no JVD, carotid bruits, or masses. Cardiac: RRR, no murmurs, rubs, or gallops. No clubbing, cyanosis, edema.  Radials/DP/PT 2+ and equal bilaterally.  Respiratory:  Respirations regular and unlabored, clear to auscultation bilaterally. GI: Soft, nontender, nondistended, BS + x 4. MS: no deformity or atrophy. Skin: warm and dry, no rash. Neuro:  Strength and sensation are intact. Psych: Normal affect.  Accessory Clinical Findings    Recent  Labs: No results  found for requested labs within last 365 days.   Recent Lipid Panel    Component Value Date/Time   CHOL 118 02/10/2021 1356   TRIG 162 (H) 02/10/2021 1356   HDL 39 (L) 02/10/2021 1356   CHOLHDL 3.0 02/10/2021 1356   CHOLHDL 4 11/22/2017 1048   VLDL 32.8 11/22/2017 1048   LDLCALC 52 02/10/2021 1356   LDLDIRECT 101.0 11/16/2016 0737    ECG personally reviewed by me today-***  EKG 12/06/2021  sinus bradycardia with first-degree AV block right superior axis deviation inferior infarct undetermined age is 54 bpm- No acute changes  Echocardiogram 10/07/2019 IMPRESSIONS     1. Left ventricular ejection fraction, by estimation, is 60 to 65%. The  left ventricle has normal function. The left ventricle has no regional  wall motion abnormalities. There is severe concentric left ventricular  hypertrophy. Left ventricular diastolic   parameters are consistent with Grade II diastolic dysfunction  (pseudonormalization). Elevated left ventricular end-diastolic pressure.   2. Right ventricular systolic function is normal. The right ventricular  size is normal. There is normal pulmonary artery systolic pressure. The  estimated right ventricular systolic pressure is 19.0 mmHg.   3. Left atrial size was mildly dilated.   4. The mitral valve is normal in structure. Trivial mitral valve  regurgitation. No evidence of mitral stenosis.   5. The aortic valve is tricuspid. Aortic valve regurgitation is mild.  Mild aortic valve sclerosis is present, with no evidence of aortic valve  stenosis.   6. Aortic dilatation noted. 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   7. The inferior vena cava is normal in size with greater than 50%  respiratory variability, suggesting right atrial pressure of 3 mmHg.   FINDINGS   Left Ventricle: Left ventricular ejection fraction, by estimation, is 60  to 65%. The left ventricle has normal function.  The left ventricle has no  regional wall motion abnormalities. The left ventricular internal cavity  size was normal in size. There is   severe concentric left ventricular hypertrophy. Left ventricular  diastolic parameters are consistent with Grade II diastolic dysfunction  (pseudonormalization). Elevated left ventricular end-diastolic pressure.   Right Ventricle: The right ventricular size is normal. No increase in  right ventricular wall thickness. Right ventricular systolic function is  normal. There is normal pulmonary artery systolic pressure. The tricuspid  regurgitant velocity is 2.00 m/s, and   with an assumed right atrial pressure of 3 mmHg, the estimated right  ventricular systolic pressure is 19.0 mmHg.   Left Atrium: Left atrial size was mildly dilated.   Right Atrium: Right atrial size was normal in size.   Pericardium: Trivial pericardial effusion is present. The pericardial  effusion is circumferential.   Mitral Valve: The mitral valve is normal in structure. Normal mobility of  the mitral valve leaflets. Trivial mitral valve regurgitation. No evidence  of mitral valve stenosis.   Tricuspid Valve: The tricuspid valve is normal in structure. Tricuspid  valve regurgitation is trivial. No evidence of tricuspid stenosis.   Aortic Valve: The aortic valve is tricuspid. Aortic valve regurgitation is  mild. Aortic regurgitation PHT measures 690 msec. Mild aortic valve  sclerosis is present, with no evidence of aortic valve stenosis.   Pulmonic Valve: The pulmonic valve was normal in structure. Pulmonic valve  regurgitation is trivial. No evidence of pulmonic stenosis.   Aorta: Aortic dilatation noted. There is mild  dilatation of the ascending  aorta and of the aortic root measuring 44 mm.   Venous: The inferior vena cava is normal in size with greater than 50%  respiratory variability, suggesting right atrial pressure of 3 mmHg.   IAS/Shunts: No atrial level shunt  detected by color flow Doppler.  CT angio chest aorta 10/08/2021  COMPARISON:  Oct 13, 2020.   FINDINGS: Cardiovascular: Grossly stable 4.2 cm ascending thoracic aortic aneurysm is noted. No dissection is noted. Great vessels are widely patent without significant stenosis. Stable cardiomegaly. No pericardial effusion. Atherosclerosis of thoracic aorta is noted. Mild coronary calcifications are noted.   Mediastinum/Nodes: No enlarged mediastinal, hilar, or axillary lymph nodes. Thyroid gland, trachea, and esophagus demonstrate no significant findings.   Lungs/Pleura: Lungs are clear. No pleural effusion or pneumothorax.   Upper Abdomen: No acute abnormality.   Musculoskeletal: No chest wall abnormality. No acute or significant osseous findings.   Review of the MIP images confirms the above findings.   IMPRESSION: Grossly stable 4.2 cm ascending thoracic aortic aneurysm. Recommend annual imaging followup by CTA or MRA. This recommendation follows 2010 ACCF/AHA/AATS/ACR/ASA/SCA/SCAI/SIR/STS/SVM Guidelines for the Diagnosis and Management of Patients with Thoracic Aortic Disease. Circulation. 2010; 121: Z610-R604. Aortic aneurysm NOS (ICD10-I71.9).   Aortic Atherosclerosis (ICD10-I70.0).     Electronically Signed   By: Lupita Raider M.D.   On: 10/08/2021 14:40  CT angio chest 5/24  EXAM: CT ANGIOGRAPHY CHEST WITH CONTRAST   TECHNIQUE: Multidetector CT imaging of the chest was performed using the standard protocol during bolus administration of intravenous contrast. Multiplanar CT image reconstructions and MIPs were obtained to evaluate the vascular anatomy.   RADIATION DOSE REDUCTION: This exam was performed according to the departmental dose-optimization program which includes automated exposure control, adjustment of the mA and/or kV according to patient size and/or use of iterative reconstruction technique.   CONTRAST:  75mL ISOVUE-370 IOPAMIDOL (ISOVUE-370)  INJECTION 76%   COMPARISON:  10/08/2021   FINDINGS: Cardiovascular: The thoracic aorta is normal in course. There is stable dilation of the thoracic aorta measuring 4.2 cm in diameter in its ascending segment, 3.9 cm in diameter in its proximal descending segment, and 3.2 cm in diameter in its distal descending segment at the level of the left atrium. No superimposed intramural hematoma or dissection. Mild atherosclerotic calcification. Arch vasculature demonstrates classic anatomic configuration and is widely patent proximally.   Extensive multi-vessel coronary artery calcification. Mild global cardiomegaly, stable since prior examination. No pericardial effusion. Central pulmonary arteries are enlarged in keeping with changes of pulmonary arterial hypertension, stable since prior examination.   Mediastinum/Nodes: Multiple tiny hypoenhancing nodules are seen within the right thyroid lobe, unlikely of clinical significance. No follow-up imaging is recommended for these lesions. The esophagus is unremarkable. Small hiatal hernia. No pathologic thoracic adenopathy.   Lungs/Pleura: Mild bibasilar atelectasis. Lungs are otherwise clear. No pneumothorax or pleural effusion.   Upper Abdomen: Stable cyst within the left hepatic lobe. Cholecystectomy has been performed. No acute abnormality within the visualized upper abdomen.   Musculoskeletal: Osseous structures are age-appropriate. No acute bone abnormality.   Review of the MIP images confirms the above findings.   IMPRESSION: 1. Stable dilation of the thoracic aorta measuring 4.2 cm in diameter in its ascending segment. No superimposed intramural hematoma or dissection. Recommend annual imaging followup by CTA or MRA. This recommendation follows 2010 ACCF/AHA/AATS/ACR/ASA/SCA/SCAI/SIR/STS/SVM Guidelines for the Diagnosis and Management of Patients with Thoracic Aortic Disease. Circulation. 2010; 121: V409-W119. Aortic  aneurysm NOS (ICD10-I71.9) 2. Extensive  multi-vessel coronary artery calcification. Mild global cardiomegaly. 3. Morphologic changes in keeping with pulmonary arterial hypertension, stable since prior examination. 4. Small hiatal hernia.   Aortic Atherosclerosis (ICD10-I70.0).     Electronically Signed   By: Helyn Numbers M.D.   On: 10/19/2022 21:01  Assessment & Plan   1.  Essential hypertension-BP today 122***/60.   Continue amlodipine, doxazosin, losartan Heart healthy low-sodium diet-reviewed Increase physical activity as tolerated  Coronary artery disease-no chest pain.  Denies exertional chest discomfort.  Had stress testing 5/21 which showed EF of 62% no ischemia. Continue aspirin, rosuvastatin, amlodipine Maintain physical activity  Hyperlipidemia-LDL 52 on 02/10/21. Continue rosuvastatin, aspirin Heart healthy low-sodium high-fiber diet Increase physical activity as tolerated Repeat fasting lipids and LFTs  Thoracic aortic aneurysm-blood pressure well controlled at home.  Importance of continued good blood pressure control reviewed.  CT angio chest 10/08/2021 showed stable aneurysm.  5/24 CT angio chest showed 4.2 cm unchanged thoracic aortic aneurysm. Maintain good blood pressure control. Repeat CT angio chest aorta 5/25.  Disposition: Follow-up with Dr. Jens Som or me in 12 months.   Thomasene Ripple. Joniya Boberg NP-C     02/13/2023, 9:44 AM [facility] [address] Office [phone] Fax [phone]  Notice: This dictation was prepared with Dragon dictation along with smaller phrase technology. Any transcriptional errors that result from this process are unintentional and may not be corrected upon review.  I spent 14 ***minutes examining this patient, reviewing medications, and using patient centered shared decision making involving her cardiac care.  Prior to her visit I spent greater than 20 minutes reviewing her past medical  history,  medications, and prior cardiac tests.

## 2023-02-14 ENCOUNTER — Ambulatory Visit: Payer: Medicare PPO | Admitting: General Practice

## 2023-02-17 DIAGNOSIS — J301 Allergic rhinitis due to pollen: Secondary | ICD-10-CM | POA: Diagnosis not present

## 2023-02-17 DIAGNOSIS — J3089 Other allergic rhinitis: Secondary | ICD-10-CM | POA: Diagnosis not present

## 2023-02-17 DIAGNOSIS — J3081 Allergic rhinitis due to animal (cat) (dog) hair and dander: Secondary | ICD-10-CM | POA: Diagnosis not present

## 2023-02-25 DIAGNOSIS — T2110XA Burn of first degree of trunk, unspecified site, initial encounter: Secondary | ICD-10-CM | POA: Diagnosis not present

## 2023-02-25 DIAGNOSIS — Z23 Encounter for immunization: Secondary | ICD-10-CM | POA: Diagnosis not present

## 2023-02-27 ENCOUNTER — Other Ambulatory Visit: Payer: Self-pay | Admitting: Cardiology

## 2023-02-27 DIAGNOSIS — E785 Hyperlipidemia, unspecified: Secondary | ICD-10-CM

## 2023-02-28 DIAGNOSIS — J3089 Other allergic rhinitis: Secondary | ICD-10-CM | POA: Diagnosis not present

## 2023-02-28 DIAGNOSIS — J301 Allergic rhinitis due to pollen: Secondary | ICD-10-CM | POA: Diagnosis not present

## 2023-02-28 DIAGNOSIS — J3081 Allergic rhinitis due to animal (cat) (dog) hair and dander: Secondary | ICD-10-CM | POA: Diagnosis not present

## 2023-03-01 ENCOUNTER — Telehealth: Payer: Self-pay | Admitting: Family Medicine

## 2023-03-01 DIAGNOSIS — Z96653 Presence of artificial knee joint, bilateral: Secondary | ICD-10-CM | POA: Diagnosis not present

## 2023-03-01 DIAGNOSIS — I700 Atherosclerosis of aorta: Secondary | ICD-10-CM | POA: Diagnosis not present

## 2023-03-01 DIAGNOSIS — M51369 Other intervertebral disc degeneration, lumbar region without mention of lumbar back pain or lower extremity pain: Secondary | ICD-10-CM | POA: Diagnosis not present

## 2023-03-01 DIAGNOSIS — E785 Hyperlipidemia, unspecified: Secondary | ICD-10-CM | POA: Diagnosis not present

## 2023-03-01 DIAGNOSIS — Z96643 Presence of artificial hip joint, bilateral: Secondary | ICD-10-CM | POA: Diagnosis not present

## 2023-03-01 NOTE — Progress Notes (Addendum)
Cardiology Clinic Note   Patient Name: Isaiah Wilson Date of Encounter: 03/03/2023  Primary Care Provider:  Kristian Covey, MD Primary Cardiologist:  Olga Millers, MD  Patient Profile    Isaiah Wilson 88 year old male presents the clinic today for follow-up evaluation of his essential hypertension and hyperlipidemia.  Past Medical History    Past Medical History:  Diagnosis Date   Abnormal EKG    hx left anterior fasicular block on old ekg 2017   Allergic rhinitis    Allergy    Barrett's esophagus    Benign prostatic hypertrophy    Colitis, ischemic (HCC)    X 2   Complication of anesthesia    SLOW TO AWAKEN AFTER 1 SURGERY 20  YRS AGO   DJD (degenerative joint disease)    SPINE, AND OA   GERD (gastroesophageal reflux disease)    H/O hiatal hernia    HOH (hard of hearing)    BOTH EARS   Hyperlipidemia    no medication per pt.   Hypertension    pt denies says cardura for bladder   Neuropathy     LEFT HAND AND ARM NUMB ALL THE TIME   Overweight(278.02)    Past Surgical History:  Procedure Laterality Date   2 neck surgeries  2005-2006   Dr. Georgia Dom cx decompression, diskectomy,fusion C6-7 allograftWITH PLATE   APPENDECTOMY     EYE SURGERY     bilateral cataract surgerywith lens implant   FOOT SURGERY     right x 3   INNER EAR SURGERY Right    07/2020   LAPAROSCOPIC CHOLECYSTECTOMY  1993   Dr. Luan Moore   left total knee replacement  07/2007   Dr. Eulah Pont    right total knee replacement  2012   Dr. Romie Jumper  1975, May 2013   x3   TONSILLECTOMY  as child   TOTAL HIP ARTHROPLASTY Left 04/18/2018   Procedure: LEFT TOTAL HIP ARTHROPLASTY ANTERIOR APPROACH;  Surgeon: Ollen Gross, MD;  Location: [facility];  Service: Orthopedics;  Laterality: Left;   TOTAL HIP ARTHROPLASTY Right 03/13/2019   Procedure: TOTAL HIP ARTHROPLASTY ANTERIOR APPROACH;  Surgeon: Ollen Gross, MD;  Location: [facility];  Service: Orthopedics;  Laterality: Right;     TOTAL KNEE REVISION Left 11/11/2015   Procedure: LEFT KNEE POLYETHELENE REVISION;  Surgeon: Ollen Gross, MD;  Location: [facility];  Service: Orthopedics;  Laterality: Left;   TOTAL KNEE REVISION Left 12/07/2016   Procedure: LEFT TOTAL KNEE REVISION;  Surgeon: Ollen Gross, MD;  Location: [facility];  Service: Orthopedics;  Laterality: Left;  Adductor Block    Allergies  No Known Allergies  History of Present Illness    Isaiah Wilson has a PMH of HLD, HTN, thoracic aortic aneurysm, and coronary artery disease.  He had a brain MRI 3/21 which showed moderate chronic microvascular ischemic changes.  His echocardiogram 5/21 showed normal LV function, severe LVH, G2 DD, left atrial enlargement, mild aortic insufficiency, and dilated ascending aorta measuring 40 felt more millimeters.  He underwent nuclear stress test 5/21 which showed an EF of 62% no ischemia.  He had a CTA 5/22 which showed a stable aortic aneurysm measuring 4.2 cm and atherosclerosis including his coronary arteries and aorta.  He had repeat CT angio chest 10/08/2021 which showed stable ascending aortic aneurysm measuring 4.2 cm.  He was seen in follow-up by Dr. Jens Som on 12/09/2020.  During that time reported no dyspnea or chest pain.  He did note dizziness with standing which should improve with sitting.  He felt his dizziness was related to his ear.  He presented to the clinic 12/06/21 for follow-up evaluation and stated he felt tired d/t caring for his spouse.  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  He was somewhat limited in his physical activity due to back pain.  We reviewed his most recent CT and he expressed understanding.  His EKG showed sinus bradycardia with first-degree AV block 54 bpm.  We planned repeat CT angio chest in 1 year, contact Isabel to reach out about respite care, and planned follow-up in 1 year.  He presents to the clinic today for follow-up evaluation  and states he continues to be the primary caregiver for his wife.  He has noticed some lower extremity swelling.  He has not been able to wear lower extremity support stockings.  We reviewed the importance of low-sodium diet.  We reviewed his thoracic aortic aneurysm.  His blood pressure has been well-controlled.  I will order a repeat CT angio chest aorta for May and repeat echocardiogram.  Will plan follow-up in 12 months.  Today he denies chest pain, shortness of breath, lower extremity edema, fatigue, palpitations, melena, hematuria, hemoptysis, diaphoresis, weakness, presyncope, syncope, orthopnea, and PND.   Home Medications    Prior to Admission medications   Medication Sig Start Date End Date Taking? Authorizing Provider  acetaminophen (TYLENOL) 650 MG CR tablet Take 650 mg by mouth every 8 (eight) hours as needed for pain.    [provider]  amLODipine (NORVASC) 5 MG tablet TAKE ONE TABLET BY MOUTH DAILY 01/08/21   [name], MD  aspirin EC 81 MG tablet Take 81 mg by mouth daily.    [provider]  azelastine (ASTELIN) 0.1 % nasal spray Place 1 spray into both nostrils 2 (two) times daily as needed (allergies.).     [provider]  clobetasol (TEMOVATE) 0.05 % external solution     [provider]  doxazosin (CARDURA) 8 MG tablet Take 1 tablet (8 mg total) by mouth at bedtime. 11/24/21   Burchette, Elberta Fortis, MD  EPINEPHrine 0.3 mg/0.3 mL IJ SOAJ injection Inject 0.3 mg into the muscle as needed (allergic reaction).     [provider]  esomeprazole (NEXIUM) 40 MG capsule TAKE ONE CAPSULE BY MOUTH DAILY (TAKE ON AN EMPTY STOMACH 30 MINUTES PRIOR TO A MEAL) FOR BARRETT'S ESOPHAGUS 06/17/21   Hilarie Fredrickson, MD  fluticasone [facility]) 50 MCG/ACT nasal spray Place 2 sprays into both nostrils daily.     [provider]  loratadine (CLARITIN) 10 MG tablet     [provider]  losartan (COZAAR) 100 MG tablet TAKE ONE TABLET BY  MOUTH DAILY 08/31/21   Burchette, Elberta Fortis, MD  montelukast (SINGULAIR) 10 MG tablet TAKE ONE TABLET BY MOUTH EVERY NIGHT AT BEDTIME 10/11/21   Burchette, Elberta Fortis, MD  mupirocin ointment (BACTROBAN) 2 %  12/26/19   [provider]  rosuvastatin (CRESTOR) 40 MG tablet TAKE ONE-HALF TABLET BY MOUTH ONCE A DAY 02/20/21   [provider]  tobramycin-dexamethasone Wallene Dales) ophthalmic solution Apply to eye. 12/26/19   [provider]  vancomycin (VANCOCIN) 1 g injection     [provider]  zinc gluconate 50 MG tablet Take 50 mg by mouth daily.    [provider]    Family History    Family  History  Problem Relation Age of Onset   Leukemia Mother    Heart disease Father    Heart disease Sister    Colon cancer Neg Hx    Stomach cancer Neg Hx    Rectal cancer Neg Hx    Esophageal cancer Neg Hx    He indicated that his mother is deceased. He indicated that his father is deceased. He indicated that his sister is alive. He indicated that the status of his neg hx is unknown. He indicated that the status of his other is unknown and reported the following: sibling alive.  Social History    Social History   Socioeconomic History   Marital status: Married    Spouse name: Not on file   Number of children: 2   Years of education: some college   Highest education level: Not on file  Occupational History   Occupation: retired  Tobacco Use   Smoking status: Never   Smokeless tobacco: Never  Vaping Use   Vaping status: Never Used  Substance and Sexual Activity   Alcohol use: No    Alcohol/week: 0.0 standard drinks of alcohol   Drug use: No   Sexual activity: Not on file  Other Topics Concern   Not on file  Social History Narrative   Married   HH 2   2 children; 3 granchildren; 2 great grandchildren   Social Determinants of Health   Financial Resource Strain: Low Risk  (10/11/2022)   Overall Financial Resource Strain (CARDIA)    Difficulty of  Paying Living Expenses: Not hard at all  Food Insecurity: Low Risk  (12/16/2022)   Received from [facility]   Hunger Vital Sign    Worried About Running Out of Food in the Last Year: Never true    Ran Out of Food in the Last Year: Never true  Transportation Needs: Not on file (12/16/2022)  Physical Activity: Inactive (05/03/2021)   Exercise Vital Sign    Days of Exercise per Week: 0 days    Minutes of Exercise per Session: 0 min  Stress: Stress Concern Present (05/03/2021)   [employer] of Occupational Health - Occupational Stress Questionnaire    Feeling of Stress : To some extent  Social Connections: Socially Integrated (06/11/2019)   Social Connection and Isolation Panel [NHANES]    Frequency of Communication with Friends and Family: More than three times a week    Frequency of Social Gatherings with Friends and Family: Once a week    Attends Religious Services: More than 4 times per year    Active Member of [employer] or Organizations: Yes    Attends Engineer, structural: More than 4 times per year    Marital Status: Married  Catering manager Violence: Not on file     Review of Systems    General:  No chills, fever, night sweats or weight changes.  Cardiovascular:  No chest pain, dyspnea on exertion, edema, orthopnea, palpitations, paroxysmal nocturnal dyspnea. Dermatological: No rash, lesions/masses Respiratory: No cough, dyspnea Urologic: No hematuria, dysuria Abdominal:   No nausea, vomiting, diarrhea, bright red blood per rectum, melena, or hematemesis Neurologic:  No visual changes, wkns, changes in mental status. All other systems reviewed and are otherwise negative except as noted above.  Physical Exam    VS:  BP (!) 117/44 (BP Location: Left Arm, Patient Position: Sitting, Cuff Size: Normal)   Pulse 79   Ht 5\' 11"  (1.803 m)   Wt 254 lb (115.2 kg)  BMI 35.43 kg/m  , BMI Body mass index is 35.43 kg/m. GEN: Well nourished, well developed, in no acute  distress. HEENT: normal. Neck: Supple, no JVD, carotid bruits, or masses. Cardiac: RRR, no murmurs, rubs, or gallops. No clubbing, cyanosis, generalized bilateral nonpitting lower extremity edema.  Radials/DP/PT 2+ and equal bilaterally.  Respiratory:  Respirations regular and unlabored, clear to auscultation bilaterally. GI: Soft, nontender, nondistended, BS + x 4. MS: no deformity or atrophy. Skin: warm and dry, no rash. Neuro:  Strength and sensation are intact. Psych: Normal affect.  Accessory Clinical Findings    Recent Labs: No results found for requested labs within last 365 days.   Recent Lipid Panel    Component Value Date/Time   CHOL 118 02/10/2021 1356   TRIG 162 (H) 02/10/2021 1356   HDL 39 (L) 02/10/2021 1356   CHOLHDL 3.0 02/10/2021 1356   CHOLHDL 4 11/22/2017 1048   VLDL 32.8 11/22/2017 1048   LDLCALC 52 02/10/2021 1356   LDLDIRECT 101.0 11/16/2016 0737    ECG personally reviewed by me today-EKG Interpretation Date/Time:  Wednesday January 25 2023 10:14:10 EDT Ventricular Rate:  79 PR Interval:  166 QRS Duration:  92 QT Interval:  358 QTC Calculation: 410 R Axis:   58  Text Interpretation: Normal sinus rhythm Normal ECG When compared with ECG of 18-Aug-2019 18:41, PREVIOUS ECG IS PRESENT Confirmed by Edd Fabian [phone]) on 03/03/2023 2:54:14 PM   EKG 12/06/2021  sinus bradycardia with first-degree AV block right superior axis deviation inferior infarct undetermined age is 54 bpm- No acute changes  Echocardiogram 10/07/2019 IMPRESSIONS     1. Left ventricular ejection fraction, by estimation, is 60 to 65%. The  left ventricle has normal function. The left ventricle has no regional  wall motion abnormalities. There is severe concentric left ventricular  hypertrophy. Left ventricular diastolic   parameters are consistent with Grade II diastolic dysfunction  (pseudonormalization). Elevated left ventricular end-diastolic pressure.   2. Right ventricular  systolic function is normal. The right ventricular  size is normal. There is normal pulmonary artery systolic pressure. The  estimated right ventricular systolic pressure is 19.0 mmHg.   3. Left atrial size was mildly dilated.   4. The mitral valve is normal in structure. Trivial mitral valve  regurgitation. No evidence of mitral stenosis.   5. The aortic valve is tricuspid. Aortic valve regurgitation is mild.  Mild aortic valve sclerosis is present, with no evidence of aortic valve  stenosis.   6. Aortic dilatation noted. 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   7. The inferior vena cava is normal in size with greater than 50%  respiratory variability, suggesting right atrial pressure of 3 mmHg.   FINDINGS   Left Ventricle: Left ventricular ejection fraction, by estimation, is 60  to 65%. The left ventricle has normal function. The left ventricle has no  regional wall motion abnormalities. The left ventricular internal cavity  size was normal in size. There is   severe concentric left ventricular hypertrophy. Left ventricular  diastolic parameters are consistent with Grade II diastolic dysfunction  (pseudonormalization). Elevated left ventricular end-diastolic pressure.   Right Ventricle: The right ventricular size is normal. No increase in  right ventricular wall thickness. Right ventricular systolic function is  normal. There is normal pulmonary artery systolic pressure. The tricuspid  regurgitant velocity is 2.00 m/s, and   with an assumed right atrial pressure of 3 mmHg,  the estimated right  ventricular systolic pressure is 19.0 mmHg.   Left Atrium: Left atrial size was mildly dilated.   Right Atrium: Right atrial size was normal in size.   Pericardium: Trivial pericardial effusion is present. The pericardial  effusion is circumferential.   Mitral Valve: The mitral valve is normal in structure. Normal  mobility of  the mitral valve leaflets. Trivial mitral valve regurgitation. No evidence  of mitral valve stenosis.   Tricuspid Valve: The tricuspid valve is normal in structure. Tricuspid  valve regurgitation is trivial. No evidence of tricuspid stenosis.   Aortic Valve: The aortic valve is tricuspid. Aortic valve regurgitation is  mild. Aortic regurgitation PHT measures 690 msec. Mild aortic valve  sclerosis is present, with no evidence of aortic valve stenosis.   Pulmonic Valve: The pulmonic valve was normal in structure. Pulmonic valve  regurgitation is trivial. No evidence of pulmonic stenosis.   Aorta: Aortic dilatation noted. There is mild dilatation of the ascending  aorta and of the aortic root measuring 44 mm.   Venous: The inferior vena cava is normal in size with greater than 50%  respiratory variability, suggesting right atrial pressure of 3 mmHg.   IAS/Shunts: No atrial level shunt detected by color flow Doppler.  CT angio chest aorta 10/08/2021  COMPARISON:  Oct 13, 2020.   FINDINGS: Cardiovascular: Grossly stable 4.2 cm ascending thoracic aortic aneurysm is noted. No dissection is noted. Great vessels are widely patent without significant stenosis. Stable cardiomegaly. No pericardial effusion. Atherosclerosis of thoracic aorta is noted. Mild coronary calcifications are noted.   Mediastinum/Nodes: No enlarged mediastinal, hilar, or axillary lymph nodes. Thyroid gland, trachea, and esophagus demonstrate no significant findings.   Lungs/Pleura: Lungs are clear. No pleural effusion or pneumothorax.   Upper Abdomen: No acute abnormality.   Musculoskeletal: No chest wall abnormality. No acute or significant osseous findings.   Review of the MIP images confirms the above findings.   IMPRESSION: Grossly stable 4.2 cm ascending thoracic aortic aneurysm. Recommend annual imaging followup by CTA or MRA. This recommendation follows 2010  ACCF/AHA/AATS/ACR/ASA/SCA/SCAI/SIR/STS/SVM Guidelines for the Diagnosis and Management of Patients with Thoracic Aortic Disease. Circulation. 2010; 121: Z610-R604. Aortic aneurysm NOS (ICD10-I71.9).   Aortic Atherosclerosis (ICD10-I70.0).     Electronically Signed   By: Lupita Raider M.D.   On: 10/08/2021 14:40  CT angio chest 5/24  EXAM: CT ANGIOGRAPHY CHEST WITH CONTRAST   TECHNIQUE: Multidetector CT imaging of the chest was performed using the standard protocol during bolus administration of intravenous contrast. Multiplanar CT image reconstructions and MIPs were obtained to evaluate the vascular anatomy.   RADIATION DOSE REDUCTION: This exam was performed according to the departmental dose-optimization program which includes automated exposure control, adjustment of the mA and/or kV according to patient size and/or use of iterative reconstruction technique.   CONTRAST:  75mL ISOVUE-370 IOPAMIDOL (ISOVUE-370) INJECTION 76%   COMPARISON:  10/08/2021   FINDINGS: Cardiovascular: The thoracic aorta is normal in course. There is stable dilation of the thoracic aorta measuring 4.2 cm in diameter in its ascending segment, 3.9 cm in diameter in its proximal descending segment, and 3.2 cm in diameter in its distal descending segment at the level of the left atrium. No superimposed intramural hematoma or dissection. Mild atherosclerotic calcification. Arch vasculature demonstrates classic anatomic configuration and is widely patent proximally.   Extensive multi-vessel coronary artery calcification. Mild global cardiomegaly, stable since prior examination. No pericardial effusion. Central pulmonary arteries are enlarged in keeping with changes of pulmonary arterial  hypertension, stable since prior examination.   Mediastinum/Nodes: Multiple tiny hypoenhancing nodules are seen within the right thyroid lobe, unlikely of clinical significance. No follow-up imaging is  recommended for these lesions. The esophagus is unremarkable. Small hiatal hernia. No pathologic thoracic adenopathy.   Lungs/Pleura: Mild bibasilar atelectasis. Lungs are otherwise clear. No pneumothorax or pleural effusion.   Upper Abdomen: Stable cyst within the left hepatic lobe. Cholecystectomy has been performed. No acute abnormality within the visualized upper abdomen.   Musculoskeletal: Osseous structures are age-appropriate. No acute bone abnormality.   Review of the MIP images confirms the above findings.   IMPRESSION: 1. Stable dilation of the thoracic aorta measuring 4.2 cm in diameter in its ascending segment. No superimposed intramural hematoma or dissection. Recommend annual imaging followup by CTA or MRA. This recommendation follows 2010 ACCF/AHA/AATS/ACR/ASA/SCA/SCAI/SIR/STS/SVM Guidelines for the Diagnosis and Management of Patients with Thoracic Aortic Disease. Circulation. 2010; 121: N829-F621. Aortic aneurysm NOS (ICD10-I71.9) 2. Extensive multi-vessel coronary artery calcification. Mild global cardiomegaly. 3. Morphologic changes in keeping with pulmonary arterial hypertension, stable since prior examination. 4. Small hiatal hernia.   Aortic Atherosclerosis (ICD10-I70.0).     Electronically Signed   By: Helyn Numbers M.D.   On: 10/19/2022 21:01  Assessment & Plan   1.  Essential hypertension-BP today 117/44. Continue amlodipine, doxazosin, losartan Heart healthy low-sodium diet-reviewed Increase physical activity as tolerated  Coronary artery disease-no chest pain.  Denies exertional chest discomfort.  Had stress testing 5/21 which showed EF of 62% no ischemia. Continue aspirin, rosuvastatin, amlodipine Maintain physical activity  Hyperlipidemia-LDL 52 on 02/10/21. Continue rosuvastatin, aspirin Heart healthy low-sodium high-fiber diet Increase physical activity as tolerated Repeat fasting lipids and LFTs  Thoracic aortic aneurysm-blood  pressure well controlled at home.  Importance of continued good blood pressure control reviewed.  CT angio chest 10/08/2021 showed stable aneurysm.  5/24 CT angio chest showed 4.2 cm unchanged thoracic aortic aneurysm. Maintain good blood pressure control. Repeat CT angio chest aorta 5/25.  Fatigue, diastolic function-reports increased fatigue over the last several months.  Does note some generalized lower extremity swelling.  Appears to be dependent edema.  Will repeat echocardiogram for further prognostication Heart healthy low-sodium diet Elevate lower extremities when not active   Disposition: Follow-up with Dr. Jens Som or me in 12 months.   Thomasene Ripple. Austyn Seier NP-C     03/03/2023, 2:52 PM [facility] [address] Office [phone] Fax [phone]  Notice: This dictation was prepared with Dragon dictation along with smaller phrase technology. Any transcriptional errors that result from this process are unintentional and may not be corrected upon review.  I spent 14 minutes examining this patient, reviewing medications, and using patient centered shared decision making involving her cardiac care.  Prior to her visit I spent greater than 20 minutes reviewing her past medical history,  medications, and prior cardiac tests.

## 2023-03-01 NOTE — Telephone Encounter (Signed)
Patient informed of the message below and voiced understanding  

## 2023-03-01 NOTE — Telephone Encounter (Signed)
Pt would like to know if he should get rsv and new covid vaccine

## 2023-03-03 ENCOUNTER — Ambulatory Visit: Payer: Medicare PPO | Attending: Cardiology | Admitting: General Practice

## 2023-03-03 ENCOUNTER — Encounter: Payer: Self-pay | Admitting: General Practice

## 2023-03-03 VITALS — BP 117/44 | HR 79 | Ht 71.0 in | Wt 254.0 lb

## 2023-03-03 DIAGNOSIS — I2510 Atherosclerotic heart disease of native coronary artery without angina pectoris: Secondary | ICD-10-CM

## 2023-03-03 DIAGNOSIS — R5383 Other fatigue: Secondary | ICD-10-CM

## 2023-03-03 DIAGNOSIS — I712 Thoracic aortic aneurysm, without rupture, unspecified: Secondary | ICD-10-CM | POA: Diagnosis not present

## 2023-03-03 DIAGNOSIS — I10 Essential (primary) hypertension: Secondary | ICD-10-CM | POA: Diagnosis not present

## 2023-03-03 DIAGNOSIS — I5030 Unspecified diastolic (congestive) heart failure: Secondary | ICD-10-CM | POA: Diagnosis not present

## 2023-03-03 DIAGNOSIS — E785 Hyperlipidemia, unspecified: Secondary | ICD-10-CM

## 2023-03-03 NOTE — Addendum Note (Signed)
Addended by: Corey Harold T on: 03/03/2023 03:30 PM   Modules accepted: Orders

## 2023-03-03 NOTE — Patient Instructions (Addendum)
Medication Instructions:  NO CHANGES *If you need a refill on your cardiac medications before your next appointment, please call your pharmacy*   Lab Work: NO LABS If you have labs (blood work) drawn today and your tests are completely normal, you will receive your results only by: MyChart Message (if you have MyChart) OR A paper copy in the mail If you have any lab test that is abnormal or we need to change your treatment, we will call you to review the results.   Testing/Procedures: May 2025 [facility] Non-Cardiac CT Angiography (CTA), is a special type of CT scan that uses a computer to produce multi-dimensional views of major blood vessels throughout the body. In CT angiography, a contrast material is injected through an IV to help visualize the blood vessels   [address] Your physician has requested that you have an echocardiogram. Echocardiography is a painless test that uses sound waves to create images of your heart. It provides your doctor with information about the size and shape of your heart and how well your heart's chambers and valves are working. This procedure takes approximately one hour. There are no restrictions for this procedure. Please do NOT wear cologne, perfume, aftershave, or lotions (deodorant is allowed). Please arrive 15 minutes prior to your appointment time.    Follow-Up: At [facility], you and your health needs are our priority.  As part of our continuing mission to provide you with exceptional heart care, we have created designated Provider Care Teams.  These Care Teams include your primary Cardiologist (physician) and Advanced Practice Providers (APPs -  Physician Assistants and Nurse Practitioners) who all work together to provide you with the care you need, when you need it.  We recommend signing up for the patient portal called "MyChart".  Sign up information is provided on this After Visit Summary.  MyChart is used to  connect with patients for Virtual Visits (Telemedicine).  Patients are able to view lab/test results, encounter notes, upcoming appointments, etc.  Non-urgent messages can be sent to your provider as well.   To learn more about what you can do with MyChart, go to [email or web address].    Your next appointment:   1 year(s)  Provider:   Edd Fabian, NP or Olga Millers, MD  Other Instructions

## 2023-03-06 ENCOUNTER — Encounter: Payer: Self-pay | Admitting: General Practice

## 2023-03-10 ENCOUNTER — Other Ambulatory Visit (HOSPITAL_BASED_OUTPATIENT_CLINIC_OR_DEPARTMENT_OTHER): Payer: Self-pay

## 2023-03-10 MED ORDER — COVID-19 MRNA VAC-TRIS(PFIZER) 30 MCG/0.3ML IM SUSY
0.3000 mL | PREFILLED_SYRINGE | Freq: Once | INTRAMUSCULAR | 0 refills | Status: AC
  Filled 2023-03-10: qty 0.3, 1d supply, fill #0

## 2023-03-12 ENCOUNTER — Other Ambulatory Visit: Payer: Self-pay | Admitting: Family Medicine

## 2023-03-13 ENCOUNTER — Telehealth: Payer: Self-pay | Admitting: Family Medicine

## 2023-03-13 NOTE — Telephone Encounter (Signed)
error 

## 2023-03-17 DIAGNOSIS — H918X3 Other specified hearing loss, bilateral: Secondary | ICD-10-CM | POA: Diagnosis not present

## 2023-03-17 DIAGNOSIS — H6993 Unspecified Eustachian tube disorder, bilateral: Secondary | ICD-10-CM | POA: Diagnosis not present

## 2023-03-17 DIAGNOSIS — H90A31 Mixed conductive and sensorineural hearing loss, unilateral, right ear with restricted hearing on the contralateral side: Secondary | ICD-10-CM | POA: Diagnosis not present

## 2023-03-17 DIAGNOSIS — H832X3 Labyrinthine dysfunction, bilateral: Secondary | ICD-10-CM | POA: Diagnosis not present

## 2023-03-17 DIAGNOSIS — Z9089 Acquired absence of other organs: Secondary | ICD-10-CM | POA: Diagnosis not present

## 2023-03-17 DIAGNOSIS — H90A22 Sensorineural hearing loss, unilateral, left ear, with restricted hearing on the contralateral side: Secondary | ICD-10-CM | POA: Diagnosis not present

## 2023-03-20 ENCOUNTER — Other Ambulatory Visit: Payer: Self-pay | Admitting: Cardiology

## 2023-03-20 ENCOUNTER — Ambulatory Visit
Admission: RE | Admit: 2023-03-20 | Discharge: 2023-03-20 | Disposition: A | Discharge status: Home / Self Care | Payer: Medicare PPO | Source: Ambulatory Visit | Attending: General Practice | Admitting: General Practice

## 2023-03-20 DIAGNOSIS — I712 Thoracic aortic aneurysm, without rupture, unspecified: Secondary | ICD-10-CM

## 2023-03-20 DIAGNOSIS — I7121 Aneurysm of the ascending aorta, without rupture: Secondary | ICD-10-CM | POA: Diagnosis not present

## 2023-03-20 MED ORDER — IOPAMIDOL (ISOVUE-370) INJECTION 76%
500.0000 mL | Freq: Once | INTRAVENOUS | Status: AC | PRN
  Administered 2023-03-20: 75 mL via INTRAVENOUS

## 2023-03-25 ENCOUNTER — Other Ambulatory Visit: Payer: Self-pay | Admitting: Cardiology

## 2023-03-25 DIAGNOSIS — E785 Hyperlipidemia, unspecified: Secondary | ICD-10-CM

## 2023-03-27 ENCOUNTER — Other Ambulatory Visit: Payer: Self-pay

## 2023-03-27 DIAGNOSIS — I712 Thoracic aortic aneurysm, without rupture, unspecified: Secondary | ICD-10-CM

## 2023-03-28 ENCOUNTER — Ambulatory Visit (HOSPITAL_COMMUNITY): Payer: Medicare PPO | Attending: General Practice

## 2023-03-28 DIAGNOSIS — R5383 Other fatigue: Secondary | ICD-10-CM | POA: Diagnosis not present

## 2023-03-28 DIAGNOSIS — I5030 Unspecified diastolic (congestive) heart failure: Secondary | ICD-10-CM | POA: Diagnosis not present

## 2023-03-28 LAB — ECHOCARDIOGRAM COMPLETE
Area-P 1/2: 3.42 cm2
S' Lateral: 2.8 cm

## 2023-03-29 ENCOUNTER — Ambulatory Visit: Payer: Medicare PPO | Admitting: Podiatry

## 2023-03-29 DIAGNOSIS — L84 Corns and callosities: Secondary | ICD-10-CM

## 2023-03-29 DIAGNOSIS — M79675 Pain in left toe(s): Secondary | ICD-10-CM | POA: Diagnosis not present

## 2023-03-29 DIAGNOSIS — D689 Coagulation defect, unspecified: Secondary | ICD-10-CM

## 2023-03-29 DIAGNOSIS — B351 Tinea unguium: Secondary | ICD-10-CM | POA: Diagnosis not present

## 2023-03-29 DIAGNOSIS — M79674 Pain in right toe(s): Secondary | ICD-10-CM

## 2023-03-30 NOTE — Progress Notes (Signed)
Subjective:   Patient ID: Isaiah Wilson, male   DOB: 88 y.o.   MRN: [id number]   HPI Patient presents with chronic keratotic lesion subfirst metatarsal head both feet and nail disease with elongation thickness and pain 1-5 both feet.  Patient is on blood thinner   ROS      Objective:  Physical Exam  Neurovascular status unchanged with keratotic painful lesion first metatarsal head bilateral plantar and nail disease 1-5 both feet thick and irritated     Assessment:  Chronic mycotic nail infection with pain 1-5 both feet with lesion formation plantar aspect first metatarsal head bilateral with risk of coagulation deficit     Plan:  Careful debridement of lesions bilateral no iatrogenic bleeding with sharp sterile equipment and debridement of nailbeds 1-5 both feet no intra genic bleeding reappoint for routine care

## 2023-04-03 DIAGNOSIS — Z5181 Encounter for therapeutic drug level monitoring: Secondary | ICD-10-CM | POA: Diagnosis not present

## 2023-04-03 DIAGNOSIS — M545 Low back pain, unspecified: Secondary | ICD-10-CM | POA: Diagnosis not present

## 2023-04-03 DIAGNOSIS — M51369 Other intervertebral disc degeneration, lumbar region without mention of lumbar back pain or lower extremity pain: Secondary | ICD-10-CM | POA: Diagnosis not present

## 2023-04-03 DIAGNOSIS — Z79899 Other long term (current) drug therapy: Secondary | ICD-10-CM | POA: Diagnosis not present

## 2023-04-05 ENCOUNTER — Other Ambulatory Visit (HOSPITAL_BASED_OUTPATIENT_CLINIC_OR_DEPARTMENT_OTHER): Payer: Self-pay

## 2023-04-05 ENCOUNTER — Telehealth: Payer: Self-pay | Admitting: Cardiology

## 2023-04-05 MED ORDER — AREXVY 120 MCG/0.5ML IM SUSR
0.5000 mL | Freq: Once | INTRAMUSCULAR | 0 refills | Status: AC
  Filled 2023-04-05: qty 0.5, 1d supply, fill #0

## 2023-04-05 NOTE — Telephone Encounter (Signed)
Spoke to patient's son and relayed message from ECHO. He stated patient will come by and pick up a copy of ECHO results. Medical records release form ready when patient arrive.     Please contact Mr. Mannor and let him know that his echocardiogram is been reviewed.  His pumping function was normal.  He was noted to have grade 2 diastolic dysfunction.  Trivial aortic valve regurgitation was noted.  His aortic root was noted to have mild dilation measuring 40 mm.  He was noted to have moderate dilation of his ascending aorta measuring 47 mm.  No further testing is needed at this time.  Thank you.

## 2023-04-05 NOTE — Telephone Encounter (Signed)
Pt's son would like a c/b to discuss Echo results. He states pt is confused and doesn't understand.

## 2023-04-10 ENCOUNTER — Other Ambulatory Visit: Payer: Self-pay | Admitting: Family Medicine

## 2023-04-10 DIAGNOSIS — J3089 Other allergic rhinitis: Secondary | ICD-10-CM | POA: Diagnosis not present

## 2023-04-10 DIAGNOSIS — J3081 Allergic rhinitis due to animal (cat) (dog) hair and dander: Secondary | ICD-10-CM | POA: Diagnosis not present

## 2023-04-10 DIAGNOSIS — J301 Allergic rhinitis due to pollen: Secondary | ICD-10-CM | POA: Diagnosis not present

## 2023-04-21 DIAGNOSIS — J301 Allergic rhinitis due to pollen: Secondary | ICD-10-CM | POA: Diagnosis not present

## 2023-04-21 DIAGNOSIS — J3089 Other allergic rhinitis: Secondary | ICD-10-CM | POA: Diagnosis not present

## 2023-04-21 DIAGNOSIS — M51369 Other intervertebral disc degeneration, lumbar region without mention of lumbar back pain or lower extremity pain: Secondary | ICD-10-CM | POA: Diagnosis not present

## 2023-04-21 DIAGNOSIS — J3081 Allergic rhinitis due to animal (cat) (dog) hair and dander: Secondary | ICD-10-CM | POA: Diagnosis not present

## 2023-05-02 ENCOUNTER — Ambulatory Visit: Payer: Medicare PPO | Admitting: Family Medicine

## 2023-05-02 VITALS — BP 114/60 | HR 59 | Temp 97.4°F | Ht 71.0 in | Wt 254.4 lb

## 2023-05-02 DIAGNOSIS — G589 Mononeuropathy, unspecified: Secondary | ICD-10-CM

## 2023-05-02 DIAGNOSIS — I7121 Aneurysm of the ascending aorta, without rupture: Secondary | ICD-10-CM | POA: Diagnosis not present

## 2023-05-02 DIAGNOSIS — I10 Essential (primary) hypertension: Secondary | ICD-10-CM | POA: Diagnosis not present

## 2023-05-02 DIAGNOSIS — Z23 Encounter for immunization: Secondary | ICD-10-CM

## 2023-05-02 DIAGNOSIS — R300 Dysuria: Secondary | ICD-10-CM

## 2023-05-02 DIAGNOSIS — R350 Frequency of micturition: Secondary | ICD-10-CM

## 2023-05-02 LAB — POC URINALSYSI DIPSTICK (AUTOMATED)
Bilirubin, UA: POSITIVE
Blood, UA: NEGATIVE
Glucose, UA: NEGATIVE
Leukocytes, UA: NEGATIVE
Nitrite, UA: NEGATIVE
Protein, UA: POSITIVE — AB
Spec Grav, UA: >=1.03 — AB (ref 1.010–1.025)
Urobilinogen, UA: 0.2 U/dL
pH, UA: 5 (ref 5.0–8.0)

## 2023-05-02 LAB — POCT GLYCOSYLATED HEMOGLOBIN (HGB A1C): Hemoglobin A1C: 5.9 % — AB (ref 4.0–5.6)

## 2023-05-02 MED ORDER — GABAPENTIN 100 MG PO CAPS: 100.0000 mg | ORAL_CAPSULE | Freq: Every day | ORAL | 3 refills | Status: DC

## 2023-05-02 MED ORDER — GEMTESA 75 MG PO TABS: 75.0000 mg | ORAL_TABLET | Freq: Every day | ORAL | 5 refills | Status: DC

## 2023-05-02 NOTE — Progress Notes (Signed)
Established Patient Office Visit  Subjective   Patient ID: Isaiah Wilson, male    DOB: 1935/01/19  Age: 88 y.o. MRN: [id number]  Chief Complaint  Patient presents with   Urinary Frequency    HPI   [name] Caller is seen today for the following complaints  Urine frequency especially at night.  He has significant nocturia some nights.  Avoids late day use of caffeine generally.  Usually no caffeine after late morning.  No alcohol use.  No recent burning with urination.  Does have history of prediabetes range blood sugars.  Last A1c on record was 6.1% of [place].  This was several months ago. He has urgency symptoms especially at night.  No obstructive urinary symptoms.  Does take doxazosin 8 mg nightly  He has intermittent burning in feet secondary to neuropathy.  Is taken low-dose gabapentin 100 mg nightly in the past.  Did not tolerate daytime use secondary to dizziness.  Requesting prescription for gabapentin for nighttime use.  Low-dose gabapentin helped in the past  History of ascending aortic aneurysm with recent CT angiogram of chest measuring 4.4 cm.  This apparently was 4.2 cm a year ago.  Followed by cardiology.  No recent chest pains.  Hypertension treated with amlodipine 5 mg daily and losartan 100 mg daily.  Blood pressure stable.  No recent orthostatic symptoms.  Past Medical History:  Diagnosis Date   Abnormal EKG    hx left anterior fasicular block on old ekg 2017   Allergic rhinitis    Allergy    Barrett's esophagus    Benign prostatic hypertrophy    Colitis, ischemic (HCC)    X 2   Complication of anesthesia    SLOW TO AWAKEN AFTER 1 SURGERY 20  YRS AGO   DJD (degenerative joint disease)    SPINE, AND OA   GERD (gastroesophageal reflux disease)    H/O hiatal hernia    HOH (hard of hearing)    BOTH EARS   Hyperlipidemia    no medication per pt.   Hypertension    pt denies says cardura for bladder   Neuropathy     LEFT HAND AND ARM NUMB ALL THE TIME    Overweight(278.02)    Past Surgical History:  Procedure Laterality Date   2 neck surgeries  2005-2006   Dr. Georgia Dom cx decompression, diskectomy,fusion C6-7 allograftWITH PLATE   APPENDECTOMY     EYE SURGERY     bilateral cataract surgerywith lens implant   FOOT SURGERY     right x 3   INNER EAR SURGERY Right    07/2020   LAPAROSCOPIC CHOLECYSTECTOMY  1993   Dr. Luan Moore   left total knee replacement  07/2007   Dr. Eulah Pont    right total knee replacement  2012   Dr. Romie Jumper  1975, May 2013   x3   TONSILLECTOMY  as child   TOTAL HIP ARTHROPLASTY Left 04/18/2018   Procedure: LEFT TOTAL HIP ARTHROPLASTY ANTERIOR APPROACH;  Surgeon: Ollen Gross, MD;  Location: [facility];  Service: Orthopedics;  Laterality: Left;   TOTAL HIP ARTHROPLASTY Right 03/13/2019   Procedure: TOTAL HIP ARTHROPLASTY ANTERIOR APPROACH;  Surgeon: Ollen Gross, MD;  Location: [facility];  Service: Orthopedics;  Laterality: Right;    TOTAL KNEE REVISION Left 11/11/2015   Procedure: LEFT KNEE POLYETHELENE REVISION;  Surgeon: Ollen Gross, MD;  Location: [facility];  Service: Orthopedics;  Laterality: Left;   TOTAL KNEE REVISION Left 12/07/2016  Procedure: LEFT TOTAL KNEE REVISION;  Surgeon: Ollen Gross, MD;  Location: [facility];  Service: Orthopedics;  Laterality: Left;  Adductor Block    reports that he has never smoked. He has never used smokeless tobacco. He reports that he does not drink alcohol and does not use drugs. family history includes Heart disease in his father and sister; Leukemia in his mother. No Known Allergies   Review of Systems  Constitutional:  Negative for chills, fever and malaise/fatigue.  Eyes:  Negative for blurred vision.  Respiratory:  Negative for shortness of breath.   Cardiovascular:  Negative for chest pain.  Genitourinary:  Positive for frequency and urgency. Negative for flank pain and hematuria.  Neurological:  Negative for dizziness, weakness and headaches.       Objective:     BP 114/60 (BP Location: Left Arm, Patient Position: Sitting, Cuff Size: Large)   Pulse (!) 59   Temp (!) 97.4 F (36.3 C) (Oral)   Ht 5\' 11"  (1.803 m)   Wt 254 lb 6.4 oz (115.4 kg)   SpO2 98%   BMI 35.48 kg/m  BP Readings from Last 3 Encounters:  05/02/23 114/60  03/03/23 (!) 117/44  03/23/22 118/60   Wt Readings from Last 3 Encounters:  05/02/23 254 lb 6.4 oz (115.4 kg)  03/03/23 254 lb (115.2 kg)  10/11/22 255 lb (115.7 kg)      Physical Exam Vitals reviewed.  Constitutional:      General: He is not in acute distress.    Appearance: He is well-developed. He is not toxic-appearing.  Eyes:     Pupils: Pupils are equal, round, and reactive to light.  Neck:     Thyroid: No thyromegaly.  Cardiovascular:     Rate and Rhythm: Normal rate and regular rhythm.  Pulmonary:     Effort: Pulmonary effort is normal. No respiratory distress.     Breath sounds: Normal breath sounds. No wheezing or rales.  Musculoskeletal:     Cervical back: Neck supple.  Neurological:     Mental Status: He is alert and oriented to person, place, and time.      Results for orders placed or performed in visit on 05/02/23  POCT Urinalysis Dipstick (Automated)  Result Value Ref Range   Color, UA Yellow    Clarity, UA Clear    Glucose, UA Negative Negative   Bilirubin, UA Positive    Ketones, UA Trace    Spec Grav, UA >=1.030 (A) 1.010 - 1.025   Blood, UA Negative    pH, UA 5.0 5.0 - 8.0   Protein, UA Positive (A) Negative   Urobilinogen, UA 0.2 0.2 or 1.0 E.U./dL   Nitrite, UA Negative    Leukocytes, UA Negative Negative  POC HgB A1c  Result Value Ref Range   Hemoglobin A1C 5.9 (A) 4.0 - 5.6 %   HbA1c POC (<> result, manual entry)     HbA1c, POC (prediabetic range)     HbA1c, POC (controlled diabetic range)        The ASCVD Risk score (Arnett DK, et al., 2019) failed to calculate for the following reasons:   The 2019 ASCVD risk score is only valid for ages  20 to 17    Assessment & Plan:   #1 urine frequency and urgency.  Urine dipstick reveals no signs of infection.  Denies any current urinary obstructive symptoms.  This seems to be more of a urgency incontinence.  Reminded her to avoid late the use of caffeine  and excessive fluids at night.  Consider trial of Gemtesa 75 mg nightly if he can get this covered through insurance We did recheck A1c today to make sure he did not have evidence for significant hyperglycemia.  He had no glucosuria on urine dipstick and also A1c today stable 5.9% -Set up 45-month follow-up to reassess  #2 chronic neuropathy symptoms in legs.  Symptoms worse at night.  Refill gabapentin 100 mg nightly which has helped him in the past.  He is aware of potential side effects  #3 hypertension stable and at goal on amlodipine and losartan.  Also takes Cardura 8 mg.  No recent orthostatic symptoms  #4 thoracic ascending aortic aneurysm 4.4 cm by recent CTA chest follow-up cardiology.  Continue annual follow-up   Return in about 3 months (around 07/31/2023).    Evelena Peat, MD

## 2023-05-02 NOTE — Patient Instructions (Addendum)
A1C was 5.9% which is good.    Let's try the Gemtesa at night for the urine frequency--- if we can get insurance coverage.

## 2023-05-22 ENCOUNTER — Other Ambulatory Visit: Payer: Self-pay | Admitting: Family Medicine

## 2023-05-29 ENCOUNTER — Other Ambulatory Visit: Payer: Self-pay | Admitting: Cardiology

## 2023-05-29 DIAGNOSIS — E785 Hyperlipidemia, unspecified: Secondary | ICD-10-CM

## 2023-06-07 ENCOUNTER — Ambulatory Visit: Payer: Medicare PPO | Admitting: Podiatry

## 2023-06-07 DIAGNOSIS — M7752 Other enthesopathy of left foot: Secondary | ICD-10-CM

## 2023-06-07 DIAGNOSIS — M7751 Other enthesopathy of right foot: Secondary | ICD-10-CM | POA: Diagnosis not present

## 2023-06-07 DIAGNOSIS — D689 Coagulation defect, unspecified: Secondary | ICD-10-CM | POA: Diagnosis not present

## 2023-06-07 DIAGNOSIS — L84 Corns and callosities: Secondary | ICD-10-CM

## 2023-06-11 DIAGNOSIS — M7752 Other enthesopathy of left foot: Secondary | ICD-10-CM | POA: Diagnosis not present

## 2023-06-11 DIAGNOSIS — M7751 Other enthesopathy of right foot: Secondary | ICD-10-CM | POA: Diagnosis not present

## 2023-06-11 DIAGNOSIS — L84 Corns and callosities: Secondary | ICD-10-CM | POA: Diagnosis not present

## 2023-06-11 DIAGNOSIS — D689 Coagulation defect, unspecified: Secondary | ICD-10-CM | POA: Diagnosis not present

## 2023-06-11 MED ORDER — TRIAMCINOLONE ACETONIDE 10 MG/ML IJ SUSP
10.0000 mg | Freq: Once | INTRAMUSCULAR | Status: AC
  Administered 2023-06-11: 10 mg via INTRA_ARTICULAR

## 2023-06-11 NOTE — Progress Notes (Signed)
 Subjective:   Patient ID: Isaiah Wilson, male   DOB: 89 y.o.   MRN: [id number]   HPI Patient presents stating his nails are bothering him and he gets bad calluses subfirst metatarsal both feet that are hard to walk with and he does take a blood thinner which creates higher risk.  Also is complaining currently about inflammation pain of his ankle joint of both feet   ROS      Objective:  Physical Exam  Neurovascular status unchanged with inflammation of the sinus tarsi bilateral fluid buildup and discomfort of the lesions and nailbeds that are thick 1-5 both feet that he cannot take care of     Assessment:  Inflammation of the capsule bilateral with fluid buildup that are painful along with nail disease 1-5 both feet painful and lesions subfirst metatarsal with high risk due to blood thinner     Plan:  H&P reviewed all conditions and at this point sterile prep injected the sinus tarsi bilateral 3 mg Kenalog  5 mg Xylocaine  with sterile dressings debrided lesions no iatrogenic bleeding courtesy debridement of nailbeds 1-5 both feet.  Reappoint routine care

## 2023-06-30 DIAGNOSIS — H918X3 Other specified hearing loss, bilateral: Secondary | ICD-10-CM | POA: Diagnosis not present

## 2023-06-30 DIAGNOSIS — H90A31 Mixed conductive and sensorineural hearing loss, unilateral, right ear with restricted hearing on the contralateral side: Secondary | ICD-10-CM | POA: Diagnosis not present

## 2023-06-30 DIAGNOSIS — H6993 Unspecified Eustachian tube disorder, bilateral: Secondary | ICD-10-CM | POA: Diagnosis not present

## 2023-06-30 DIAGNOSIS — H832X3 Labyrinthine dysfunction, bilateral: Secondary | ICD-10-CM | POA: Diagnosis not present

## 2023-06-30 DIAGNOSIS — H00015 Hordeolum externum left lower eyelid: Secondary | ICD-10-CM | POA: Diagnosis not present

## 2023-06-30 DIAGNOSIS — Z9089 Acquired absence of other organs: Secondary | ICD-10-CM | POA: Diagnosis not present

## 2023-06-30 DIAGNOSIS — H90A22 Sensorineural hearing loss, unilateral, left ear, with restricted hearing on the contralateral side: Secondary | ICD-10-CM | POA: Diagnosis not present

## 2023-07-02 ENCOUNTER — Other Ambulatory Visit: Payer: Self-pay | Admitting: Family Medicine

## 2023-07-03 ENCOUNTER — Other Ambulatory Visit: Payer: Self-pay

## 2023-07-03 MED ORDER — LOSARTAN POTASSIUM 100 MG PO TABS: 100.0000 mg | ORAL_TABLET | Freq: Every day | ORAL | 0 refills | Status: DC

## 2023-07-06 ENCOUNTER — Other Ambulatory Visit (HOSPITAL_COMMUNITY): Payer: Self-pay

## 2023-08-16 ENCOUNTER — Ambulatory Visit: Payer: Medicare PPO | Admitting: Podiatry

## 2023-08-16 DIAGNOSIS — M7752 Other enthesopathy of left foot: Secondary | ICD-10-CM | POA: Diagnosis not present

## 2023-08-16 DIAGNOSIS — L84 Corns and callosities: Secondary | ICD-10-CM

## 2023-08-16 DIAGNOSIS — D689 Coagulation defect, unspecified: Secondary | ICD-10-CM

## 2023-08-16 DIAGNOSIS — M7751 Other enthesopathy of right foot: Secondary | ICD-10-CM

## 2023-08-16 MED ORDER — TRIAMCINOLONE ACETONIDE 10 MG/ML IJ SUSP
10.0000 mg | Freq: Once | INTRAMUSCULAR | Status: AC
  Administered 2023-08-16: 10 mg via INTRA_ARTICULAR

## 2023-08-17 NOTE — Progress Notes (Signed)
 Subjective:   Patient ID: Isaiah Wilson, male   DOB: 89 y.o.   MRN: [id number]   HPI Patient presents with chronic pain in the ankles of both feet that have become worse with lesions on the first metatarsal head plantar bilateral which become painful with patient on blood thinner and high risk   ROS      Objective:  Physical Exam  Neurovascular status stable with inflammation fluid of the sinus tarsi bilateral painful when pressed and lesions subfirst metatarsal head bilateral painful with patient on blood thinner     Assessment:  Inflammatory capsulitis sinus tarsi bilateral fluid buildup with lesion plantar aspect both feet     Plan:  H&P reviewed sterile prep injected the sinus tarsi bilateral 3 mg Kenalog 5 mg Xylocaine with sterile dressings and debrided lesions bilateral no iatrogenic bleeding reappoint routine care

## 2023-09-29 DIAGNOSIS — H7311 Chronic myringitis, right ear: Secondary | ICD-10-CM | POA: Diagnosis not present

## 2023-09-29 DIAGNOSIS — H832X3 Labyrinthine dysfunction, bilateral: Secondary | ICD-10-CM | POA: Diagnosis not present

## 2023-09-29 DIAGNOSIS — H90A22 Sensorineural hearing loss, unilateral, left ear, with restricted hearing on the contralateral side: Secondary | ICD-10-CM | POA: Diagnosis not present

## 2023-09-29 DIAGNOSIS — Z9089 Acquired absence of other organs: Secondary | ICD-10-CM | POA: Diagnosis not present

## 2023-09-29 DIAGNOSIS — H90A31 Mixed conductive and sensorineural hearing loss, unilateral, right ear with restricted hearing on the contralateral side: Secondary | ICD-10-CM | POA: Diagnosis not present

## 2023-09-29 DIAGNOSIS — H6993 Unspecified Eustachian tube disorder, bilateral: Secondary | ICD-10-CM | POA: Diagnosis not present

## 2023-09-29 DIAGNOSIS — H918X3 Other specified hearing loss, bilateral: Secondary | ICD-10-CM | POA: Diagnosis not present

## 2023-10-06 ENCOUNTER — Other Ambulatory Visit: Payer: Self-pay | Admitting: Family Medicine

## 2023-10-16 ENCOUNTER — Encounter: Payer: Self-pay | Admitting: Family Medicine

## 2023-10-16 ENCOUNTER — Ambulatory Visit: Admitting: Family Medicine

## 2023-10-16 VITALS — BP 120/60 | HR 64 | Temp 97.6°F | Wt 256.5 lb

## 2023-10-16 DIAGNOSIS — M170 Bilateral primary osteoarthritis of knee: Secondary | ICD-10-CM | POA: Diagnosis not present

## 2023-10-16 DIAGNOSIS — E785 Hyperlipidemia, unspecified: Secondary | ICD-10-CM | POA: Diagnosis not present

## 2023-10-16 DIAGNOSIS — R3915 Urgency of urination: Secondary | ICD-10-CM

## 2023-10-16 DIAGNOSIS — I10 Essential (primary) hypertension: Secondary | ICD-10-CM | POA: Diagnosis not present

## 2023-10-16 MED ORDER — GEMTESA 75 MG PO TABS: 75.0000 mg | ORAL_TABLET | Freq: Every day | ORAL | 2 refills | Status: DC

## 2023-10-16 NOTE — Progress Notes (Signed)
 Established Patient Office Visit  Subjective   Patient ID: Isaiah Wilson, male    DOB: 1935/04/20  Age: 89 y.o. MRN: [id number]  Chief Complaint  Patient presents with   Medical Management of Chronic Issues    HPI   Isaiah Wilson is seen for chronic medical follow-up.  He has history of a ascending aortic aneurysm, hypertension, GERD with Barrett's esophagus, chronic neuropathy, sensorineural hearing loss, osteoarthritis involving multiple joints, hyperlipidemia.  He complains of diffuse bodyaches.  He was recently seen at [facility] and had discussion with ENT and they had mention a drug called Journavx-as potentially a safer option to opioids for chronic pain.  He has history of ascending aortic aneurysm but most recent measurement 4.4 cm.  He was told that he would not be a surgical candidate because of age.  He is questioning why he needs annual follow-up since he is not a surgical candidate.  He remains on losartan  100 mg daily for hypertension.  Also takes doxazosin  8 mg nightly.  He is on rosuvastatin  20 mg daily for hyperlipidemia.  Also takes some vitamin supplements.  Takes montelukast  10 mg daily for allergies.  Is also on low-dose gabapentin .  He does go to the [place] but is not sure he has had any labs recently regarding his lipids.  Our last record of labs for chemistries and lipids was 2023  Does complain of urinary urgency and states he usually gets up about 4 times at night to urinate.  No obstructive symptoms.  Has urgency symptoms during the day as well.  Past Medical History:  Diagnosis Date   Abnormal EKG    hx left anterior fasicular block on old ekg 2017   Allergic rhinitis    Allergy    Barrett's esophagus    Benign prostatic hypertrophy    Colitis, ischemic (HCC)    X 2   Complication of anesthesia    SLOW TO AWAKEN AFTER 1 SURGERY 20  YRS AGO   DJD (degenerative joint disease)    SPINE, AND OA   GERD (gastroesophageal reflux disease)    H/O hiatal  hernia    HOH (hard of hearing)    BOTH EARS   Hyperlipidemia    no medication per pt.   Hypertension    pt denies says cardura  for bladder   Neuropathy     LEFT HAND AND ARM NUMB ALL THE TIME   Overweight(278.02)    Past Surgical History:  Procedure Laterality Date   2 neck surgeries  2005-2006   Dr. Franchester Inoue cx decompression, diskectomy,fusion C6-7 allograftWITH PLATE   APPENDECTOMY     EYE SURGERY     bilateral cataract surgerywith lens implant   FOOT SURGERY     right x 3   INNER EAR SURGERY Right    07/2020   LAPAROSCOPIC CHOLECYSTECTOMY  1993   Dr. Lucinda Saber   left total knee replacement  07/2007   Dr. Abigail Abler    right total knee replacement  2012   Dr. Cynthea Drier  1975, May 2013   x3   TONSILLECTOMY  as child   TOTAL HIP ARTHROPLASTY Left 04/18/2018   Procedure: LEFT TOTAL HIP ARTHROPLASTY ANTERIOR APPROACH;  Surgeon: Liliane Rei, MD;  Location: [facility];  Service: Orthopedics;  Laterality: Left;   TOTAL HIP ARTHROPLASTY Right 03/13/2019   Procedure: TOTAL HIP ARTHROPLASTY ANTERIOR APPROACH;  Surgeon: Liliane Rei, MD;  Location: [facility];  Service: Orthopedics;  Laterality: Right;   TOTAL KNEE REVISION Left 11/11/2015   Procedure: LEFT KNEE POLYETHELENE REVISION;  Surgeon: Liliane Rei, MD;  Location: [facility];  Service: Orthopedics;  Laterality: Left;   TOTAL KNEE REVISION Left 12/07/2016   Procedure: LEFT TOTAL KNEE REVISION;  Surgeon: Liliane Rei, MD;  Location: [facility];  Service: Orthopedics;  Laterality: Left;  Adductor Block    reports that he has never smoked. He has never used smokeless tobacco. He reports that he does not drink alcohol and does not use drugs. family history includes Heart disease in his father and sister; Leukemia in his mother. No Known Allergies  Review of Systems  Constitutional:  Negative for malaise/fatigue.  Eyes:  Negative for blurred vision.  Respiratory:  Negative for shortness of breath.   Cardiovascular:   Negative for chest pain.  Musculoskeletal:  Positive for back pain and joint pain.  Neurological:  Negative for dizziness, weakness and headaches.      Objective:     BP 120/60 (BP Location: Left Arm, Patient Position: Sitting, Cuff Size: Normal)   Pulse 64   Temp 97.6 F (36.4 C) (Oral)   Wt 256 lb 8 oz (116.3 kg)   SpO2 95%   BMI 35.77 kg/m  BP Readings from Last 3 Encounters:  10/16/23 120/60  05/02/23 114/60  03/03/23 (!) 117/44   Wt Readings from Last 3 Encounters:  10/16/23 256 lb 8 oz (116.3 kg)  05/02/23 254 lb 6.4 oz (115.4 kg)  03/03/23 254 lb (115.2 kg)      Physical Exam Vitals reviewed.  Constitutional:      General: He is not in acute distress.    Appearance: Normal appearance.  Cardiovascular:     Comments: Regular rhythm.  Rate is mildly bradycardic at about 56-he states this is usual for him Pulmonary:     Effort: Pulmonary effort is normal.     Breath sounds: Normal breath sounds.  Musculoskeletal:     Right lower leg: No edema.     Left lower leg: No edema.  Neurological:     Mental Status: He is alert.      No results found for any visits on 10/16/23.    The ASCVD Risk score (Arnett DK, et al., 2019) failed to calculate for the following reasons:   The 2019 ASCVD risk score is only valid for ages 64 to 31    Assessment & Plan:   #1 hyperlipidemia.  Patient on rosuvastatin  20 mg daily.  Recheck lipid today along with CMP.  #2 hypertension.  Currently well-controlled.  Continue losartan  100 mg daily.  Continue low-sodium diet  #3 ascending thoracic aortic aneurysm.  4.4 cm by last measurement.  Patient has been advised for annual reassessment.  #4 urinary urgency.  This has been chronic.  Very disruptive to quality of life.  Usually gets up about 4 times at night to urinate.  Consider trial of Gemtesa  75 mg nightly  #5 osteoarthritis involving multiple joints.  He states his quality of life is fairly poor because of his chronic pain.   He asked us  to look into new pain medicine option which is Journavx.  I expressed that I would do research to look at possible use of this if this appears to be a safe alternative for him   No follow-ups on file.    Glean Lamy, MD

## 2023-10-17 ENCOUNTER — Ambulatory Visit: Payer: Self-pay | Admitting: Family Medicine

## 2023-10-17 LAB — LIPID PANEL
Cholesterol: 108 mg/dL (ref 0–200)
HDL: 35.1 mg/dL — ABNORMAL LOW (ref >39.00)
LDL Cholesterol: 24 mg/dL (ref 0–99)
NonHDL: 72.89
Total CHOL/HDL Ratio: 3
Triglycerides: 245 mg/dL — ABNORMAL HIGH (ref 0.0–149.0)
VLDL: 49 mg/dL — ABNORMAL HIGH (ref 0.0–40.0)

## 2023-10-17 LAB — COMPREHENSIVE METABOLIC PANEL WITH GFR
ALT: 20 U/L (ref 0–53)
AST: 26 U/L (ref 0–37)
Albumin: 4.3 g/dL (ref 3.5–5.2)
Alkaline Phosphatase: 46 U/L (ref 39–117)
BUN: 14 mg/dL (ref 6–23)
CO2: 22 meq/L (ref 19–32)
Calcium: 9 mg/dL (ref 8.4–10.5)
Chloride: 107 meq/L (ref 96–112)
Creatinine, Ser: 0.97 mg/dL (ref 0.40–1.50)
GFR: 70.68 mL/min (ref >60.00)
Glucose, Bld: 116 mg/dL — ABNORMAL HIGH (ref 70–99)
Potassium: 4.2 meq/L (ref 3.5–5.1)
Sodium: 141 meq/L (ref 135–145)
Total Bilirubin: 0.5 mg/dL (ref 0.2–1.2)
Total Protein: 6.8 g/dL (ref 6.0–8.3)

## 2023-10-18 ENCOUNTER — Telehealth: Payer: Self-pay

## 2023-10-18 NOTE — Telephone Encounter (Signed)
 Please see result note

## 2023-10-18 NOTE — Telephone Encounter (Signed)
 Copied from CRM [id number]. Topic: General - Call Back - No Documentation >> Oct 18, 2023  9:27 AM Juleen Oakland F wrote: Reason for CRM: Patient says he's returning Dr. Vara Gentle nurses phone call - no documentation. Please call him back at [phone] (H)

## 2023-10-25 ENCOUNTER — Encounter: Payer: Self-pay | Admitting: Podiatry

## 2023-10-25 ENCOUNTER — Ambulatory Visit: Admitting: Podiatry

## 2023-10-25 DIAGNOSIS — B351 Tinea unguium: Secondary | ICD-10-CM | POA: Diagnosis not present

## 2023-10-25 DIAGNOSIS — M79675 Pain in left toe(s): Secondary | ICD-10-CM

## 2023-10-25 DIAGNOSIS — D689 Coagulation defect, unspecified: Secondary | ICD-10-CM | POA: Diagnosis not present

## 2023-10-25 DIAGNOSIS — L84 Corns and callosities: Secondary | ICD-10-CM | POA: Diagnosis not present

## 2023-10-25 DIAGNOSIS — M7751 Other enthesopathy of right foot: Secondary | ICD-10-CM

## 2023-10-25 DIAGNOSIS — M79674 Pain in right toe(s): Secondary | ICD-10-CM

## 2023-10-25 DIAGNOSIS — M7752 Other enthesopathy of left foot: Secondary | ICD-10-CM

## 2023-10-25 MED ORDER — TRIAMCINOLONE ACETONIDE 10 MG/ML IJ SUSP
10.0000 mg | Freq: Once | INTRAMUSCULAR | Status: AC
  Administered 2023-10-25: 10 mg via INTRA_ARTICULAR

## 2023-10-25 NOTE — Progress Notes (Signed)
 Subjective:   Patient ID: Isaiah Wilson, male   DOB: 89 y.o.   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  I went ahead today debrided lesions on first metatarsal bilateral no iatrogenic bleeding reappoint routine care for this I did sterile prep injected the sinus tarsi bilateral 3 mg Kenalog  5 mg Xylocaine  with sterile dressings and I debrided nailbeds 1-5 both feet iatrogenic bleeding reappoint routine care

## 2023-11-03 ENCOUNTER — Telehealth: Payer: Self-pay

## 2023-11-03 NOTE — Telephone Encounter (Signed)
 Copied from CRM [id number]. Topic: Clinical - Medication Question >> Nov 03, 2023  2:21 PM Allyne Areola wrote: Reason for CRM: Patient is calling to verify if Dr.Burchette has been able to research the pain medication they discussed during their last appointment. Patient does not remember the name of the medication but it has been about two weeks and he has not gotten a response.

## 2023-11-06 NOTE — Telephone Encounter (Signed)
 Patient informed of the message below and voiced understanding. Patient inquired if rx can be sent into his pharmacy.

## 2023-11-15 ENCOUNTER — Other Ambulatory Visit: Payer: Self-pay | Admitting: Family Medicine

## 2023-12-12 ENCOUNTER — Telehealth: Payer: Self-pay | Admitting: Family Medicine

## 2023-12-12 ENCOUNTER — Ambulatory Visit (INDEPENDENT_AMBULATORY_CARE_PROVIDER_SITE_OTHER): Admitting: Family Medicine

## 2023-12-12 DIAGNOSIS — Z0000 Encounter for general adult medical examination without abnormal findings: Secondary | ICD-10-CM | POA: Diagnosis not present

## 2023-12-12 MED ORDER — JOURNAVX 50 MG PO TABS: 50.0000 mg | ORAL_TABLET | Freq: Two times a day (BID) | ORAL | 2 refills | Status: DC | PRN

## 2023-12-12 NOTE — Telephone Encounter (Signed)
 Patient has history of severe arthritis pain involving multiple joints.  Has not gotten much relief with Tylenol .  He had recently inquired about Journavx .  He has checked with his insurance and states that they will cover this.  He apparently will have to pay about $20 per month.  We sent a prescription for medication to start 1 twice daily #60 with 2 refills and he will give some feedback in the next couple weeks.  Wolm LELON Scarlet MD [facility]

## 2023-12-12 NOTE — Progress Notes (Signed)
 PATIENT CHECK-IN and HEALTH RISK ASSESSMENT QUESTIONNAIRE:  -completed by phone/video for upcoming Medicare Preventive Visit   Pre-Visit Check-in: 1)Vitals (height, wt, BP, etc) - record in vitals section for visit on day of visit Request home vitals (wt, BP, etc.) and enter into vitals, THEN update Vital Signs SmartPhrase below at the top of the HPI. See below.  2)Review and Update Medications, Allergies PMH, Surgeries, Social history in Epic 3)Hospitalizations in the last year with date/reason? no  4)Review and Update Care Team (patient's specialists) in Epic 5) Complete PHQ9 in Epic  6) Complete Fall Screening in Epic 7)Review all Health Maintenance Due and order under PCP if not done.  Medicare Wellness Patient Questionnaire:  Answer theses question about your habits: How often do you have a drink containing alcohol?n How many drinks containing alcohol do you have on a typical day when you are drinking?na How often do you have six or more drinks on one occasion?na Have you ever smoked?n Quit date if applicable? na  How many packs a day do/did you smoke? 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 Walks up and down and all over his house.  On average, how many minutes do you engage in exercise at this level?n Typical diet: mostly prepared foods as wife is sick. Mostly frozen meals and take out - but tries to choose veggies and lean proteins. Sometimes sandwiches.   Beverages: water    Answer theses question about your everyday activities: Can you perform most household chores?y Are you deaf or have significant trouble hearing?some - using hearing aids Do you feel that you have a problem with memory?some, feels is ok for age Do you feel safe at home?y Last dentist visit? He agrees to get to the dentist 8. Do you  have any difficulty performing your everyday activities?n Are you having any difficulty walking, taking medications on your own, and or difficulty managing daily home needs?n Do you have difficulty walking or climbing stairs?n Do you have difficulty dressing or bathing?n Do you have difficulty doing errands alone such as visiting a doctor's office or shopping?n Do you currently have any difficulty preparing food and eating?n Do you currently have any difficulty using the toilet?n Do you have any difficulty managing your finances?n Do you have any difficulties with housekeeping of managing your housekeeping?n   Do you have Advanced Directives in place (Living Will, Healthcare Power or Attorney)? y   [place] and [place], [place] 6 [place] - going next week   Do you currently use prescribed or non-prescribed narcotic or opioid pain medications?y  Do you have a history or close family history of breast, ovarian, tubal or peritoneal cancer or a family member with BRCA (breast cancer susceptibility 1 and 2) gene mutations?n    ----------------------------------------------------------------------------------------------------------------------------------------------------------------------------------------------------------------------  Because this visit was a virtual/telehealth visit, some criteria may be missing or patient reported. Any vitals not documented were not able to be obtained and vitals that have been documented are patient reported.    MEDICARE ANNUAL PREVENTIVE CARE VISIT WITH PROVIDER (Welcome to Medicare, initial annual wellness or annual wellness exam)  Virtual Visit via Phone Note  I connected with Isaiah Wilson on 12/12/23  by phone and verified that I am speaking with the correct person using two identifiers.  Location patient: home Location  provider:work or home office Persons participating in the virtual visit: patient, provider  Concerns  and/or follow up today: stable - no new concerns. Reports is working with Dr. Micheal for his chronic pain. Uses cane all the time.    See HM section in Epic for other details of completed HM.    ROS: negative for report of fevers, unintentional weight loss, vision changes, vision loss, hearing loss or change, chest pain, sob, hemoptysis, melena, hematochezia, hematuria, falls, bleeding or bruising, thoughts of suicide or self harm, memory loss  Patient-completed extensive health risk assessment - reviewed and discussed with the patient: See Health Risk Assessment completed with patient prior to the visit either above or in recent phone note. This was reviewed in detailed with the patient today and appropriate recommendations, orders and referrals were placed as needed per Summary below and patient instructions.   Review of Medical History: -PMH, PSH, Family History and current specialty and care providers reviewed and updated and listed below   Patient Care Team: Micheal Wolm ORN, MD as PCP - General (Family Medicine) Pietro Redell RAMAN, MD as PCP - Cardiology (Cardiology)   Past Medical History:  Diagnosis Date   Abnormal EKG    hx left anterior fasicular block on old ekg 2017   Allergic rhinitis    Allergy    Barrett's esophagus    Benign prostatic hypertrophy    Colitis, ischemic (HCC)    X 2   Complication of anesthesia    SLOW TO AWAKEN AFTER 1 SURGERY 20  YRS AGO   DJD (degenerative joint disease)    SPINE, AND OA   GERD (gastroesophageal reflux disease)    H/O hiatal hernia    HOH (hard of hearing)    BOTH EARS   Hyperlipidemia    no medication per pt.   Hypertension    pt denies says cardura  for bladder   Neuropathy     LEFT HAND AND ARM NUMB ALL THE TIME   Overweight(278.02)     Past Surgical History:  Procedure Laterality Date   2 neck surgeries  2005-2006   Dr. Fate cx decompression, diskectomy,fusion C6-7 allograftWITH PLATE   APPENDECTOMY      EYE SURGERY     bilateral cataract surgerywith lens implant   FOOT SURGERY     right x 3   INNER EAR SURGERY Right    07/2020   LAPAROSCOPIC CHOLECYSTECTOMY  1993   Dr. MYRTIS Salt   left total knee replacement  07/2007   Dr. erskine    right total knee replacement  2012   Dr. Hiram REPINE  1975, May 2013   x3   TONSILLECTOMY  as child   TOTAL HIP ARTHROPLASTY Left 04/18/2018   Procedure: LEFT TOTAL HIP ARTHROPLASTY ANTERIOR APPROACH;  Surgeon: Melodi Lerner, MD;  Location: [facility];  Service: Orthopedics;  Laterality: Left;   TOTAL HIP ARTHROPLASTY Right 03/13/2019   Procedure: TOTAL HIP ARTHROPLASTY ANTERIOR APPROACH;  Surgeon: Melodi Lerner, MD;  Location: [facility];  Service: Orthopedics;  Laterality: Right;    TOTAL KNEE REVISION Left 11/11/2015   Procedure: LEFT KNEE POLYETHELENE REVISION;  Surgeon: Lerner Melodi, MD;  Location: [facility];  Service: Orthopedics;  Laterality: Left;   TOTAL KNEE REVISION Left 12/07/2016   Procedure: LEFT TOTAL KNEE REVISION;  Surgeon: Melodi Lerner, MD;  Location: [facility];  Service: Orthopedics;  Laterality: Left;  Adductor Block    Social History   Socioeconomic History   Marital status: Married  Spouse name: Not on file   Number of children: 2   Years of education: some college   Highest education level: Not on file  Occupational History   Occupation: retired  Tobacco Use   Smoking status: Never   Smokeless tobacco: Never  Vaping Use   Vaping status: Never Used  Substance and Sexual Activity   Alcohol use: No    Alcohol/week: 0.0 standard drinks of alcohol   Drug use: No   Sexual activity: Not on file  Other Topics Concern   Not on file  Social History Narrative   Married   HH 2   2 children; 3 granchildren; 2 great grandchildren   Social Drivers of Corporate investment banker Strain: Low Risk  (10/11/2022)   Overall Financial Resource Strain (CARDIA)    Difficulty of Paying Living Expenses: Not hard at all  Food  Insecurity: Low Risk  (09/29/2023)   Received from [facility]   Hunger Vital Sign    Within the past 12 months, you worried that your food would run out before you got money to buy more: Never true    Within the past 12 months, the food you bought just didn't last and you didn't have money to get more. : Never true  Transportation Needs: No Transportation Needs (09/29/2023)   Received from [facility]    In the past 12 months, has lack of reliable transportation kept you from medical appointments, meetings, work or from getting things needed for daily living? : No  Physical Activity: Inactive (05/03/2021)   Exercise Vital Sign    Days of Exercise per Week: 0 days    Minutes of Exercise per Session: 0 min  Stress: Stress Concern Present (05/03/2021)   [employer] of Occupational Health - Occupational Stress Questionnaire    Feeling of Stress : To some extent  Social Connections: Socially Integrated (06/11/2019)   Social Connection and Isolation Panel    Frequency of Communication with Friends and Family: More than three times a week    Frequency of Social Gatherings with Friends and Family: Once a week    Attends Religious Services: More than 4 times per year    Active Member of [employer] or Organizations: Yes    Attends Engineer, structural: More than 4 times per year    Marital Status: Married  Catering manager Violence: Not on file    Family History  Problem Relation Age of Onset   Leukemia Mother    Heart disease Father    Heart disease Sister    Colon cancer Neg Hx    Stomach cancer Neg Hx    Rectal cancer Neg Hx    Esophageal cancer Neg Hx     Current Outpatient Medications on File Prior to Visit  Medication Sig Dispense Refill   acetaminophen  (TYLENOL ) 650 MG CR tablet Take 650 mg by mouth every 8 (eight) hours as needed for pain.     amLODipine  (NORVASC ) 5 MG tablet TAKE 1 TABLET BY MOUTH DAILY 90 tablet 3   aspirin  EC 81 MG tablet Take 81 mg  by mouth daily.     azelastine  (ASTELIN ) 0.1 % nasal spray Place 1 spray into both nostrils 2 (two) times daily as needed (allergies.).      doxazosin  (CARDURA ) 8 MG tablet TAKE 1 TABLET BY MOUTH EVERY NIGHT AT BEDTIME 90 tablet 1   EPINEPHrine  0.3 mg/0.3 mL IJ SOAJ injection Inject 0.3 mg into the muscle as  needed (allergic reaction).      esomeprazole  (NEXIUM ) 40 MG capsule TAKE ONE CAPSULE BY MOUTH DAILY (TAKE ON AN EMPTY STOMACH 30 MINUTES PRIOR TO A MEAL) FOR BARRETT'S ESOPHAGUS 90 capsule 0   fluticasone  (FLONASE ) 50 MCG/ACT nasal spray Place 2 sprays into both nostrils daily.      gabapentin  (NEURONTIN ) 100 MG capsule Take 1 capsule (100 mg total) by mouth at bedtime. 90 capsule 3   loratadine  (CLARITIN ) 10 MG tablet      losartan  (COZAAR ) 100 MG tablet TAKE 1 TABLET BY MOUTH DAILY 90 tablet 0   montelukast  (SINGULAIR ) 10 MG tablet TAKE 1 TABLET BY MOUTH EVERY NIGHT AT BEDTIME 90 tablet 1   mupirocin ointment (BACTROBAN) 2 %      OXYCODONE  HCL PO Take 2 tablets by mouth at bedtime.     rosuvastatin  (CRESTOR ) 20 MG tablet TAKE 1 TABLET BY MOUTH DAILY 30 tablet 9   vancomycin  (VANCOCIN ) 1 g injection      Vibegron  (GEMTESA ) 75 MG TABS Take 1 tablet (75 mg total) by mouth daily. 30 tablet 5   Vibegron  (GEMTESA ) 75 MG TABS Take 1 tablet (75 mg total) by mouth daily. 30 tablet 2   zinc  gluconate 50 MG tablet Take 50 mg by mouth daily.     No current facility-administered medications on file prior to visit.    No Known Allergies     Physical Exam Vitals requested from patient and listed below if patient had equipment and was able to obtain at home for this virtual visit: There were no vitals filed for this visit. Estimated body mass index is 35.77 kg/m as calculated from the following:   Height as of 05/02/23: 5' 11 (1.803 m).   Weight as of 10/16/23: 256 lb 8 oz (116.3 kg).  EKG (optional): deferred due to virtual visit  GENERAL: alert, oriented, no acute distress detected; full  vision exam deferred due to pandemic and/or virtual encounter   PSYCH/NEURO: pleasant and cooperative, no obvious depression or anxiety, speech and thought processing grossly intact, Cognitive function grossly intact  Flowsheet Row Office Visit from 10/11/2022 in [facility]  PHQ-9 Total Score 9        12/12/2023    3:12 PM 10/16/2023    2:17 PM 10/11/2022    4:30 PM 05/03/2021   10:01 AM 06/11/2019    3:31 PM  Depression screen PHQ 2/9  Decreased Interest 0 0 0 0 0  Down, Depressed, Hopeless 0 0 0 0 0  PHQ - 2 Score 0 0 0 0 0  Altered sleeping   3    Tired, decreased energy   3    Change in appetite   3    Feeling bad or failure about yourself    0    Trouble concentrating   0    Moving slowly or fidgety/restless   0    Suicidal thoughts   0    PHQ-9 Score   9    Difficult doing work/chores   Not difficult at all         06/11/2019    3:31 PM 08/18/2019    5:52 PM 05/03/2021    9:59 AM 10/11/2022    4:31 PM 12/12/2023    3:12 PM  Fall Risk  Falls in the past year? 0   1 1 0  Was there an injury with Fall?   0 1 0  Fall Risk Category Calculator   1  3 0  Fall Risk Category (Retired)   Low     (RETIRED) Patient Fall Risk Level Low fall risk  Low fall risk  Moderate fall risk     Patient at Risk for Falls Due to Medication side effect  Impaired balance/gait;Medication side effect No Fall Risks   Fall risk Follow up Falls prevention discussed;Education provided;Falls evaluation completed   Falls evaluation completed;Education provided;Falls prevention discussed  Falls evaluation completed Falls evaluation completed;Education provided     Data saved with a previous flowsheet row definition     SUMMARY AND PLAN:  Encounter for Medicare annual wellness exam  Discussed applicable health maintenance/preventive health measures and advised and referred or ordered per patient preferences: -discussed vaccines due, he reports he will get them at the  pharmacy at [facility] Maintenance  Topic Date Due   DTaP/Tdap/Td (2 - Tdap) 06/23/2019   COVID-19 Vaccine (8 - Pfizer risk 2024-25 season) 09/08/2023   INFLUENZA VACCINE  12/29/2023   Medicare Annual Wellness (AWV)  12/11/2024   Pneumococcal Vaccine: 50+ Years  Completed   Zoster Vaccines- Shingrix  Completed   Hepatitis B Vaccines  Aged Out   HPV VACCINES  Aged Out   Meningococcal B Vaccine  Aged [employer] and counseling on the following was provided based on the above review of health and a plan/checklist for the patient, along with additional information discussed, was provided for the patient in the patient instructions :  -Provided counseling and plan for difficulty hearing  - he feels the hearing aid is working for now.  -Provided counseling and plan for increased risk of falling if applicable per above screening. He is using caution and holding onto wall or cane when ambulating. Provided safe balance exercises that can be done at home to improve balance and discussed exercise guidelines for adults with include balance exercises at least 3 days per week.  -Advised and counseled on a healthy lifestyle - including the importance of a healthy diet, regular physical activity, social connections  -Reviewed patient's current diet. Advised and counseled on a whole foods based healthy diet. A summary of a healthy diet was provided in the Patient Instructions.  -reviewed patient's current physical activity level and discussed exercise guidelines for adults. Discussed community resources and ideas for safe exercise at home to assist in meeting exercise guideline recommendations in a safe and healthy way.  -Advise yearly dental visits at minimum and regular eye exams   Follow up: see patient instructions   Patient Instructions  I really enjoyed getting to talk with you today! I am available on Tuesdays and Thursdays for virtual visits if you have any questions or concerns, or  if I can be of any further assistance.   CHECKLIST FROM ANNUAL WELLNESS VISIT:  -Follow up (please call to schedule if not scheduled after visit):   -yearly for annual wellness visit with primary care office  Here is a list of your preventive care/health maintenance measures and the plan for each if any are due:  PLAN For any measures below that may be due:   1. Can get the flu and covid vaccine at the pharmacy - new ones should be available in the fall. Please let us  know when you do so that we can update your record.    2. Can get the Tdap at the pharmacy. Please let us  know if you do so that we can update your record.   Health Maintenance  Topic Date Due  DTaP/Tdap/Td (2 - Tdap) 06/23/2019   COVID-19 Vaccine (8 - Pfizer risk 2024-25 season) 09/08/2023   INFLUENZA VACCINE  12/29/2023   Medicare Annual Wellness (AWV)  12/11/2024   Pneumococcal Vaccine: 50+ Years  Completed   Zoster Vaccines- Shingrix  Completed   Hepatitis B Vaccines  Aged Out   HPV VACCINES  Aged Out   Meningococcal B Vaccine  Aged Out    -See a dentist at least yearly  -Get your eyes checked and then per your eye specialist's recommendations  -Other issues addressed today:   -I have included below further information regarding a healthy whole foods based diet, physical activity guidelines for adults, stress management and opportunities for social connections. I hope you find this information useful.   -----------------------------------------------------------------------------------------------------------------------------------------------------------------------------------------------------------------------------------------------------------    NUTRITION: -eat real food: lots of colorful vegetables (half the plate) and fruits -5-7 servings of vegetables and fruits per day (fresh or steamed is best), exp. 2 servings of vegetables with lunch and dinner and 2 servings of fruit per day. Berries and  greens such as kale and collards are great choices.  -consume on a regular basis:  fresh fruits, fresh veggies, fish, nuts, seeds, healthy oils (such as olive oil, avocado oil), whole grains (make sure for bread/pasta/crackers/etc., that the first ingredient on label contains the word whole), legumes. -can eat small amounts of dairy and lean meat (no larger than the palm of your hand), but avoid processed meats such as ham, bacon, lunch meat, etc. -drink water  -try to avoid fast food and pre-packaged foods, processed meat, ultra processed foods/beverages (donuts, candy, etc.) -most experts advise limiting sodium to < 2300mg  per day, should limit further is any chronic conditions such as high blood pressure, heart disease, diabetes, etc. The American Heart Association advised that < 1500mg  is is ideal -try to avoid foods/beverages that contain any ingredients with names you do not recognize  -try to avoid foods/beverages  with added sugar or sweeteners/sweets  -try to avoid sweet drinks (including diet drinks): soda, juice, Gatorade, sweet tea, power drinks, diet drinks -try to avoid white rice, white bread, pasta (unless whole grain)  EXERCISE GUIDELINES FOR ADULTS: -if you wish to increase your physical activity, do so gradually and with the approval of your doctor -STOP and seek medical care immediately if you have any chest pain, chest discomfort or trouble breathing when starting or increasing exercise  -move and stretch your body, legs, feet and arms when sitting for long periods -Physical activity guidelines for optimal health in adults: -get at least 150 minutes per week of moderate exercise (can talk, but not sing); this is about 20-30 minutes of sustained activity 5-7 days per week or two 10-15 minute episodes of sustained activity 5-7 days per week -do some muscle building/resistance training/strength training at least 2 days per week  -balance exercises 3+ days per week:   Stand  somewhere where you have something sturdy to hold onto if you lose balance    1) lift up on toes, then back down, start with 5x per day and work up to 20x   2) stand and lift one leg straight out to the side so that foot is a few inches of the floor, start with 5x each side and work up to 20x each side   3) stand on one foot, start with 5 seconds each side and work up to 20 seconds on each side  If you need ideas or help with getting more active:  -Silver sneakers https://tools.silversneakers.com  -  Walk with a Doc: http://www.duncan-williams.com/  -try to include resistance (weight lifting/strength building) and balance exercises twice per week: or the following link for ideas: http://castillo-powell.com/  BuyDucts.dk  STRESS MANAGEMENT: -can try meditating, or just sitting quietly with deep breathing while intentionally relaxing all parts of your body for 5 minutes daily -if you need further help with stress, anxiety or depression please follow up with your primary doctor or contact the wonderful folks at [facility]: [phone]  SOCIAL CONNECTIONS: -options in [place] if you wish to engage in more social and exercise related activities:  -Silver sneakers https://tools.silversneakers.com  -Walk with a Doc: http://www.duncan-williams.com/  -Check out the [facility] Active Adults 50+ section on the [place] of [employer] (hiking clubs, book clubs, cards and games, chess, exercise classes, aquatic classes and much more) - see the website for details: https://www.[place].gov/departments/parks-recreation/active-adults50  -YouTube has lots of exercise videos for different ages and abilities as well  -[facility] (a variety of indoor and outdoor inperson activities for adults). [phone]. [address]  -Virtual Online Classes (a variety of topics): see  seniorplanet.org or call [phone]  -consider volunteering at a school, hospice center, church, senior center or elsewhere            Chiquita JONELLE Cramp, DO

## 2023-12-12 NOTE — Patient Instructions (Signed)
 I really enjoyed getting to talk with you today! I am available on Tuesdays and Thursdays for virtual visits if you have any questions or concerns, or if I can be of any further assistance.   CHECKLIST FROM ANNUAL WELLNESS VISIT:  -Follow up (please call to schedule if not scheduled after visit):   -yearly for annual wellness visit with primary care office  Here is a list of your preventive care/health maintenance measures and the plan for each if any are due:  PLAN For any measures below that may be due:   1. Can get the flu and covid vaccine at the pharmacy - new ones should be available in the fall. Please let us  know when you do so that we can update your record.    2. Can get the Tdap at the pharmacy. Please let us  know if you do so that we can update your record.   Health Maintenance  Topic Date Due   DTaP/Tdap/Td (2 - Tdap) 06/23/2019   COVID-19 Vaccine (8 - Pfizer risk 2024-25 season) 09/08/2023   INFLUENZA VACCINE  12/29/2023   Medicare Annual Wellness (AWV)  12/11/2024   Pneumococcal Vaccine: 50+ Years  Completed   Zoster Vaccines- Shingrix  Completed   Hepatitis B Vaccines  Aged Out   HPV VACCINES  Aged Out   Meningococcal B Vaccine  Aged Out    -See a dentist at least yearly  -Get your eyes checked and then per your eye specialist's recommendations  -Other issues addressed today:   -I have included below further information regarding a healthy whole foods based diet, physical activity guidelines for adults, stress management and opportunities for social connections. I hope you find this information useful.   -----------------------------------------------------------------------------------------------------------------------------------------------------------------------------------------------------------------------------------------------------------    NUTRITION: -eat real food: lots of colorful vegetables (half the plate) and fruits -5-7 servings of  vegetables and fruits per day (fresh or steamed is best), exp. 2 servings of vegetables with lunch and dinner and 2 servings of fruit per day. Berries and greens such as kale and collards are great choices.  -consume on a regular basis:  fresh fruits, fresh veggies, fish, nuts, seeds, healthy oils (such as olive oil, avocado oil), whole grains (make sure for bread/pasta/crackers/etc., that the first ingredient on label contains the word whole), legumes. -can eat small amounts of dairy and lean meat (no larger than the palm of your hand), but avoid processed meats such as ham, bacon, lunch meat, etc. -drink water  -try to avoid fast food and pre-packaged foods, processed meat, ultra processed foods/beverages (donuts, candy, etc.) -most experts advise limiting sodium to < 2300mg  per day, should limit further is any chronic conditions such as high blood pressure, heart disease, diabetes, etc. The American Heart Association advised that < 1500mg  is is ideal -try to avoid foods/beverages that contain any ingredients with names you do not recognize  -try to avoid foods/beverages  with added sugar or sweeteners/sweets  -try to avoid sweet drinks (including diet drinks): soda, juice, Gatorade, sweet tea, power drinks, diet drinks -try to avoid white rice, white bread, pasta (unless whole grain)  EXERCISE GUIDELINES FOR ADULTS: -if you wish to increase your physical activity, do so gradually and with the approval of your doctor -STOP and seek medical care immediately if you have any chest pain, chest discomfort or trouble breathing when starting or increasing exercise  -move and stretch your body, legs, feet and arms when sitting for long periods -Physical activity guidelines for optimal health in adults: -get at  least 150 minutes per week of moderate exercise (can talk, but not sing); this is about 20-30 minutes of sustained activity 5-7 days per week or two 10-15 minute episodes of sustained activity 5-7  days per week -do some muscle building/resistance training/strength training at least 2 days per week  -balance exercises 3+ days per week:   Stand somewhere where you have something sturdy to hold onto if you lose balance    1) lift up on toes, then back down, start with 5x per day and work up to 20x   2) stand and lift one leg straight out to the side so that foot is a few inches of the floor, start with 5x each side and work up to 20x each side   3) stand on one foot, start with 5 seconds each side and work up to 20 seconds on each side  If you need ideas or help with getting more active:  -Silver sneakers https://tools.silversneakers.com  -Walk with a Doc: http://www.duncan-williams.com/  -try to include resistance (weight lifting/strength building) and balance exercises twice per week: or the following link for ideas: http://castillo-powell.com/  BuyDucts.dk  STRESS MANAGEMENT: -can try meditating, or just sitting quietly with deep breathing while intentionally relaxing all parts of your body for 5 minutes daily -if you need further help with stress, anxiety or depression please follow up with your primary doctor or contact the wonderful folks at [facility]: [phone]  SOCIAL CONNECTIONS: -options in [place] if you wish to engage in more social and exercise related activities:  -Silver sneakers https://tools.silversneakers.com  -Walk with a Doc: http://www.duncan-williams.com/  -Check out the [facility] Active Adults 50+ section on the [place] of [employer] (hiking clubs, book clubs, cards and games, chess, exercise classes, aquatic classes and much more) - see the website for details: https://www.[place].gov/departments/parks-recreation/active-adults50  -YouTube has lots of exercise videos for different ages and abilities as well  -[facility] (a  variety of indoor and outdoor inperson activities for adults). [phone]. [address]  -Virtual Online Classes (a variety of topics): see seniorplanet.org or call [phone])[phone]  -consider volunteering at a school, hospice center, church, senior center or elsewhere

## 2023-12-14 ENCOUNTER — Telehealth: Payer: Self-pay | Admitting: *Deleted

## 2023-12-14 NOTE — Telephone Encounter (Signed)
 Copied from CRM [id number]. Topic: Clinical - Medication Prior Auth >> Dec 14, 2023  3:10 PM Mia F wrote: Reason for CRM: Pt is calling to ask for a prior auth Suzetrigine  (JOURNAVX ) 50 MG TABS  If any questions call pt at [phone]  ----------------------------------------------------------------------- From previous Reason for Contact - Prescription Issue: Reason for CRM:

## 2023-12-15 ENCOUNTER — Telehealth: Payer: Self-pay

## 2023-12-15 ENCOUNTER — Other Ambulatory Visit (HOSPITAL_COMMUNITY): Payer: Self-pay

## 2023-12-15 NOTE — Telephone Encounter (Signed)
 Pharmacy Patient Advocate Encounter  Received notification from [employer] that Prior Authorization for Journavx  50 has been DENIED.  Full denial letter will be uploaded to the media tab. See denial reason below.   PA #/Case ID/Reference #: [id number]

## 2023-12-15 NOTE — Telephone Encounter (Signed)
 Pharmacy Patient Advocate Encounter   Received notification from CoverMyMeds that prior authorization for Journavx  50 is required/requested.   Insurance verification completed.   The patient is insured through [employer] .   Per test claim: PA required; PA submitted to above mentioned insurance via CoverMyMeds Key/confirmation #/[id number] Status is pending

## 2023-12-18 ENCOUNTER — Telehealth: Payer: Self-pay

## 2023-12-18 ENCOUNTER — Telehealth: Payer: Self-pay | Admitting: Family Medicine

## 2023-12-18 NOTE — Telephone Encounter (Signed)
 MD sent message to cma to call patient will close this encounter

## 2023-12-18 NOTE — Telephone Encounter (Signed)
 Per pharm this med has been denied. Pt would like a callback

## 2023-12-18 NOTE — Telephone Encounter (Signed)
 Patient would like a call back pertaining to a prior authorization that has been submitted for Suzetrigine  (JOURNAVX ) 50 MG TABS            CB#[phone]

## 2023-12-18 NOTE — Telephone Encounter (Signed)
 Please see previous encounter

## 2023-12-18 NOTE — Telephone Encounter (Signed)
 Patient's wife informed of the message below and voiced understanding

## 2023-12-18 NOTE — Telephone Encounter (Signed)
 Copied from CRM [id number]. Topic: Clinical - Medication Prior Auth >> Dec 18, 2023  8:22 AM Suzen RAMAN wrote: Reason for RMF:Ejupzwu would like a call back pertaining to a prior authorization that has been submitted for Suzetrigine  (JOURNAVX ) 50 MG TABS   CB#[phone] >> Dec 18, 2023  2:07 PM Mesmerise C wrote: Patient advised Doctor was supposed to [employer] pertaining PA fro his Journvax advised insurance denied the PA stated he was denied due to doctor not giving insurance a call patient keeps requesting that the doctor has to call the insurance for approval patient can be reached at [phone]

## 2023-12-18 NOTE — Telephone Encounter (Signed)
 Patient was informed that Journavx  medication has been denied by insurance

## 2023-12-27 ENCOUNTER — Ambulatory Visit: Admitting: Podiatry

## 2023-12-27 ENCOUNTER — Encounter: Payer: Self-pay | Admitting: Podiatry

## 2023-12-27 DIAGNOSIS — L84 Corns and callosities: Secondary | ICD-10-CM

## 2023-12-27 DIAGNOSIS — D689 Coagulation defect, unspecified: Secondary | ICD-10-CM

## 2023-12-27 DIAGNOSIS — M7751 Other enthesopathy of right foot: Secondary | ICD-10-CM | POA: Diagnosis not present

## 2023-12-27 DIAGNOSIS — M7752 Other enthesopathy of left foot: Secondary | ICD-10-CM | POA: Diagnosis not present

## 2023-12-27 MED ORDER — TRIAMCINOLONE ACETONIDE 10 MG/ML IJ SUSP
10.0000 mg | Freq: Once | INTRAMUSCULAR | Status: AC
Start: 2023-12-27 — End: 2023-12-27
  Administered 2023-12-27: 10 mg via INTRA_ARTICULAR

## 2023-12-28 NOTE — Progress Notes (Signed)
 Subjective:   Patient ID: Isaiah Wilson, male   DOB: 89 y.o.   MRN: [id number]   HPI Patient presents with a lot of different problems with continued inflammation in his ankles understanding that we are having to do these injections more frequently than I would like but at this point it is the only thing that gives him relief and it seems to only last a couple months.  Chronic lesions first metatarsal head bilateral and nail disease 1-5 both feet that he cannot take care of   ROS      Objective:  Physical Exam  Neurovascular status unchanged from previous visit with diminishment of neuropathic sensation with patient also on blood thinner.  He is found to have intense discomfort that just started again in the sinus tarsi bilateral and is noted to have keratotic tissue subfirst metatarsal bilateral and elongated nailbeds 1-5 both feet that are thick and can become painful     Assessment:  Inflammatory capsulitis sinus tarsi bilateral with fluid buildup along with mycotic nail infection 1-5 both feet and lesions of the plantar first metatarsal bilateral with patient on blood thinner and cannot take care of himself     Plan:  H&P all conditions reviewed sterile prep injected the sinus tarsi bilateral 3 mg Dexasone Kenalog  5 mg Xylocaine  with sterile dressings and then I debrided nailbeds 1-5 both feet no iatrogenic bleeding and carefully debrided lesions subfirst metatarsal head bilateral no iatrogenic bleeding and reappoint approximate 10 weeks of routine care

## 2024-01-11 ENCOUNTER — Other Ambulatory Visit: Payer: Self-pay | Admitting: Family Medicine

## 2024-01-18 ENCOUNTER — Other Ambulatory Visit: Payer: Self-pay | Admitting: Family Medicine

## 2024-02-28 ENCOUNTER — Encounter: Payer: Self-pay | Admitting: Podiatry

## 2024-02-28 ENCOUNTER — Ambulatory Visit: Admitting: Podiatry

## 2024-02-28 DIAGNOSIS — D689 Coagulation defect, unspecified: Secondary | ICD-10-CM

## 2024-02-28 DIAGNOSIS — M7752 Other enthesopathy of left foot: Secondary | ICD-10-CM

## 2024-02-28 DIAGNOSIS — M7751 Other enthesopathy of right foot: Secondary | ICD-10-CM | POA: Diagnosis not present

## 2024-02-28 DIAGNOSIS — L84 Corns and callosities: Secondary | ICD-10-CM

## 2024-02-28 MED ORDER — TRIAMCINOLONE ACETONIDE 10 MG/ML IJ SUSP
10.0000 mg | Freq: Once | INTRAMUSCULAR | Status: AC
  Administered 2024-02-28: 10 mg via INTRA_ARTICULAR

## 2024-02-29 NOTE — Progress Notes (Signed)
 Subjective:   Patient ID: Isaiah Wilson, male   DOB: 89 y.o.   MRN: [id number]   HPI Patient presents with chronic pain in both subtalar joints that only do well for about 8 weeks with injections but really do help and keratotic lesion subfirst metatarsal head bilateral painful when pressed with patient on blood thinner   ROS      Objective:  Physical Exam  Neurovascular status unchanged from previous visits with continued inflammation exquisite discomfort in the sinus tarsi bilateral with keratotic lesion subfirst metatarsal head bilateral     Assessment:  Chronic lesion formation of the plantar first metatarsal bilateral painful along with inflammation of the sinus tarsi bilateral with fluid buildup     Plan:  H&P reviewed both conditions and today I went ahead sterile prep injected the sinus tarsi bilateral 3 mg dexamethasone  Kenalog  5 mg Xylocaine  applied sterile dressing and I debrided lesions first metatarsal bilateral no iatrogenic bleeding reappoint routine care

## 2024-03-04 ENCOUNTER — Other Ambulatory Visit (HOSPITAL_BASED_OUTPATIENT_CLINIC_OR_DEPARTMENT_OTHER): Payer: Self-pay

## 2024-03-04 MED ORDER — COMIRNATY 30 MCG/0.3ML IM SUSY
0.3000 mL | PREFILLED_SYRINGE | Freq: Once | INTRAMUSCULAR | 0 refills | Status: AC
  Filled 2024-03-04: qty 0.3, 1d supply, fill #0

## 2024-03-04 MED ORDER — FLUZONE HIGH-DOSE 0.5 ML IM SUSY
0.5000 mL | PREFILLED_SYRINGE | Freq: Once | INTRAMUSCULAR | 0 refills | Status: AC
Start: 2024-03-04 — End: 2024-03-05
  Filled 2024-03-04: qty 0.5, 1d supply, fill #0

## 2024-04-02 ENCOUNTER — Other Ambulatory Visit: Payer: Self-pay | Admitting: Cardiology

## 2024-04-06 ENCOUNTER — Other Ambulatory Visit: Payer: Self-pay | Admitting: Cardiology

## 2024-04-06 ENCOUNTER — Other Ambulatory Visit: Payer: Self-pay | Admitting: Family Medicine

## 2024-04-06 DIAGNOSIS — E785 Hyperlipidemia, unspecified: Secondary | ICD-10-CM

## 2024-04-07 NOTE — Progress Notes (Unsigned)
 Cardiology Clinic Note   Patient Name: Isaiah Wilson Date of Encounter: 04/07/2024  Primary Care Provider:  Micheal Wolm ORN, MD Primary Cardiologist:  Isaiah Shallow, MD  Patient Profile    Isaiah Wilson 89 year old male presents the clinic today for follow-up evaluation of his essential hypertension and hyperlipidemia.  Past Medical History    Past Medical History:  Diagnosis Date   Abnormal EKG    hx left anterior fasicular block on old ekg 2017   Allergic rhinitis    Allergy    Barrett's esophagus    Benign prostatic hypertrophy    Colitis, ischemic    X 2   Complication of anesthesia    SLOW TO AWAKEN AFTER 1 SURGERY 20  YRS AGO   DJD (degenerative joint disease)    SPINE, AND OA   GERD (gastroesophageal reflux disease)    H/O hiatal hernia    HOH (hard of hearing)    BOTH EARS   Hyperlipidemia    no medication per pt.   Hypertension    pt denies says cardura  for bladder   Neuropathy     LEFT HAND AND ARM NUMB ALL THE TIME   Overweight(278.02)    Past Surgical History:  Procedure Laterality Date   2 neck surgeries  2005-2006   Dr. Fate cx decompression, diskectomy,fusion C6-7 allograftWITH PLATE   APPENDECTOMY     EYE SURGERY     bilateral cataract surgerywith lens implant   FOOT SURGERY     right x 3   INNER EAR SURGERY Right    07/2020   LAPAROSCOPIC CHOLECYSTECTOMY  1993   Dr. MYRTIS Salt   left total knee replacement  07/2007   Dr. erskine    right total knee replacement  2012   Dr. Hiram REPINE  1975, May 2013   x3   TONSILLECTOMY  as child   TOTAL HIP ARTHROPLASTY Left 04/18/2018   Procedure: LEFT TOTAL HIP ARTHROPLASTY ANTERIOR APPROACH;  Surgeon: Melodi Lerner, MD;  Location: [facility];  Service: Orthopedics;  Laterality: Left;   TOTAL HIP ARTHROPLASTY Right 03/13/2019   Procedure: TOTAL HIP ARTHROPLASTY ANTERIOR APPROACH;  Surgeon: Melodi Lerner, MD;  Location: [facility];  Service: Orthopedics;  Laterality: Right;     TOTAL KNEE REVISION Left 11/11/2015   Procedure: LEFT KNEE POLYETHELENE REVISION;  Surgeon: Lerner Melodi, MD;  Location: [facility];  Service: Orthopedics;  Laterality: Left;   TOTAL KNEE REVISION Left 12/07/2016   Procedure: LEFT TOTAL KNEE REVISION;  Surgeon: Melodi Lerner, MD;  Location: [facility];  Service: Orthopedics;  Laterality: Left;  Adductor Block    Allergies  No Known Allergies  History of Present Illness    Isaiah Wilson has a PMH of HLD, HTN, thoracic aortic aneurysm, and coronary artery disease.  He had a brain MRI 3/21 which showed moderate chronic microvascular ischemic changes.  His echocardiogram 5/21 showed normal LV function, severe LVH, G2 DD, left atrial enlargement, mild aortic insufficiency, and dilated ascending aorta measuring 40 felt more millimeters.  He underwent nuclear stress test 5/21 which showed an EF of 62% no ischemia.  He had a CTA 5/22 which showed a stable aortic aneurysm measuring 4.2 cm and atherosclerosis including his coronary arteries and aorta.  He had repeat CT angio chest 10/08/2021 which showed stable ascending aortic aneurysm measuring 4.2 cm.  CT angio chest aorta 03/20/2023 showed ascending aorta measuring 4.4 cm.  He was seen in follow-up by Dr. Shallow on  12/09/2020.  During that time reported no dyspnea or chest pain.  He did note dizziness with standing which should improve with sitting.  He felt his dizziness was related to his ear.  He presented to the clinic 12/06/21 for follow-up evaluation and stated he felt tired d/t caring for his spouse.  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  He was somewhat limited in his physical activity due to back pain.  We reviewed his most recent CT and he expressed understanding.  His EKG showed sinus bradycardia with first-degree AV block 54 bpm.  We planned repeat CT angio chest in 1 year, contact Isaiah Wilson to reach out about respite care, and planned follow-up in 1  year.  He presented to the clinic 03/03/23 for follow-up evaluation and stated he continued to be the primary caregiver for his wife.  He had noticed some lower extremity swelling.  He had not been able to wear lower extremity support stockings.  We reviewed the importance of low-sodium diet.  We reviewed his thoracic aortic aneurysm.  His blood pressure had been well-controlled.  I order a repeat CT angio chest aorta for May and repeat echocardiogram.  Follow-up in 12 months was planned.   He presents to the clinic today for follow-up evaluation and states***.  Today he denies chest pain, shortness of breath, lower extremity edema, fatigue, palpitations, melena, hematuria, hemoptysis, diaphoresis, weakness, presyncope, syncope, orthopnea, and PND.   Home Medications    Prior to Admission medications   Medication Sig Start Date End Date Taking? Authorizing Provider  acetaminophen  (TYLENOL ) 650 MG CR tablet Take 650 mg by mouth every 8 (eight) hours as needed for pain.    [provider]  amLODipine  (NORVASC ) 5 MG tablet TAKE ONE TABLET BY MOUTH DAILY 01/08/21   Pietro Isaiah RAMAN, MD  aspirin  EC 81 MG tablet Take 81 mg by mouth daily.    [provider]  azelastine  (ASTELIN ) 0.1 % nasal spray Place 1 spray into both nostrils 2 (two) times daily as needed (allergies.).     [provider]  clobetasol (TEMOVATE) 0.05 % external solution     [provider]  doxazosin  (CARDURA ) 8 MG tablet Take 1 tablet (8 mg total) by mouth at bedtime. 11/24/21   Burchette, Wolm ORN, MD  EPINEPHrine  0.3 mg/0.3 mL IJ SOAJ injection Inject 0.3 mg into the muscle as needed (allergic reaction).     [provider]  esomeprazole  (NEXIUM ) 40 MG capsule TAKE ONE CAPSULE BY MOUTH DAILY (TAKE ON AN EMPTY STOMACH 30 MINUTES PRIOR TO A MEAL) FOR BARRETT'S ESOPHAGUS 06/17/21   Abran Norleen SAILOR, MD  fluticasone  (FLONASE ) 50 MCG/ACT nasal spray Place 2 sprays into both nostrils daily.      [provider]  loratadine  (CLARITIN ) 10 MG tablet     [provider]  losartan  (COZAAR ) 100 MG tablet TAKE ONE TABLET BY MOUTH DAILY 08/31/21   Burchette, Wolm ORN, MD  montelukast  (SINGULAIR ) 10 MG tablet TAKE ONE TABLET BY MOUTH EVERY NIGHT AT BEDTIME 10/11/21   Burchette, Wolm ORN, MD  mupirocin ointment (BACTROBAN) 2 %  12/26/19   [provider]  rosuvastatin  (CRESTOR ) 40 MG tablet TAKE ONE-HALF TABLET BY MOUTH ONCE A DAY 02/20/21   [provider]  tobramycin-dexamethasone  ANTHONEY) ophthalmic solution Apply to eye. 12/26/19   [provider]  vancomycin  (VANCOCIN ) 1 g injection     [provider]  zinc  gluconate  50 MG tablet Take 50 mg by mouth daily.    [provider]    Family History    Family History  Problem Relation Age of Onset   Leukemia Mother    Heart disease Father    Heart disease Sister    Colon cancer Neg Hx    Stomach cancer Neg Hx    Rectal cancer Neg Hx    Esophageal cancer Neg Hx    He indicated that his mother is deceased. He indicated that his father is deceased. He indicated that his sister is alive. He indicated that the status of his neg hx is unknown. He indicated that the status of his other is unknown and reported the following: sibling alive.  Social History    Social History   Socioeconomic History   Marital status: Married    Spouse name: Not on file   Number of children: 2   Years of education: some college   Highest education level: Not on file  Occupational History   Occupation: retired  Tobacco Use   Smoking status: Never   Smokeless tobacco: Never  Vaping Use   Vaping status: Never Used  Substance and Sexual Activity   Alcohol use: No    Alcohol/week: 0.0 standard drinks of alcohol   Drug use: No   Sexual activity: Not on file  Other Topics Concern   Not on file  Social History Narrative   Married   HH 2   2 children; 3 granchildren; 2 great grandchildren    Social Drivers of Corporate Investment Banker Strain: Low Risk  (10/11/2022)   Overall Financial Resource Strain (CARDIA)    Difficulty of Paying Living Expenses: Not hard at all  Food Insecurity: Low Risk  (09/29/2023)   Received from [facility]   Hunger Vital Sign    Within the past 12 months, you worried that your food would run out before you got money to buy more: Never true    Within the past 12 months, the food you bought just didn't last and you didn't have money to get more. : Never true  Transportation Needs: No Transportation Needs (09/29/2023)   Received from [facility]    In the past 12 months, has lack of reliable transportation kept you from medical appointments, meetings, work or from getting things needed for daily living? : No  Physical Activity: Inactive (05/03/2021)   Exercise Vital Sign    Days of Exercise per Week: 0 days    Minutes of Exercise per Session: 0 min  Stress: Stress Concern Present (05/03/2021)   [employer] of Occupational Health - Occupational Stress Questionnaire    Feeling of Stress : To some extent  Social Connections: Socially Integrated (06/11/2019)   Social Connection and Isolation Panel    Frequency of Communication with Friends and Family: More than three times a week    Frequency of Social Gatherings with Friends and Family: Once a week    Attends Religious Services: More than 4 times per year    Active Member of [employer] or Organizations: Yes    Attends Engineer, Structural: More than 4 times per year    Marital Status: Married  Catering Manager Violence: Not on file     Review of Systems    General:  No chills, fever, night sweats or weight changes.  Cardiovascular:  No chest pain, dyspnea on exertion, edema, orthopnea, palpitations, paroxysmal nocturnal dyspnea. Dermatological: No rash, lesions/masses Respiratory:  No cough, dyspnea Urologic: No hematuria, dysuria Abdominal:   No nausea, vomiting,  diarrhea, bright red blood per rectum, melena, or hematemesis Neurologic:  No visual changes, wkns, changes in mental status. All other systems reviewed and are otherwise negative except as noted above.  Physical Exam    VS:  There were no vitals taken for this visit. , BMI There is no height or weight on file to calculate BMI. GEN: Well nourished, well developed, in no acute distress. HEENT: normal. Neck: Supple, no JVD, carotid bruits, or masses. Cardiac: RRR, no murmurs, rubs, or gallops. No clubbing, cyanosis, generalized bilateral nonpitting lower extremity edema.  Radials/DP/PT 2+ and equal bilaterally.  Respiratory:  Respirations regular and unlabored, clear to auscultation bilaterally. GI: Soft, nontender, nondistended, BS + x 4. MS: no deformity or atrophy. Skin: warm and dry, no rash. Neuro:  Strength and sensation are intact. Psych: Normal affect.  Accessory Clinical Findings    Recent Labs: 10/16/2023: ALT 20; BUN 14; Creatinine, Ser 0.97; Potassium 4.2; Sodium 141   Recent Lipid Panel    Component Value Date/Time   CHOL 108 10/16/2023 1506   CHOL 118 02/10/2021 1356   TRIG 245.0 (H) 10/16/2023 1506   HDL 35.10 (L) 10/16/2023 1506   HDL 39 (L) 02/10/2021 1356   CHOLHDL 3 10/16/2023 1506   VLDL 49.0 (H) 10/16/2023 1506   LDLCALC 24 10/16/2023 1506   LDLCALC 52 02/10/2021 1356   LDLDIRECT 101.0 11/16/2016 0737    ECG personally reviewed by me today-EKG Interpretation Date/Time:    Ventricular Rate:    PR Interval:    QRS Duration:    QT Interval:    QTC Calculation:   R Axis:      Text Interpretation:     EKG 12/06/2021  sinus bradycardia with first-degree AV block right superior axis deviation inferior infarct undetermined age is 54 bpm- No acute changes  Echocardiogram 10/07/2019 IMPRESSIONS     1. Left ventricular ejection fraction, by estimation, is 60 to 65%. The  left ventricle has normal function. The left ventricle has no regional  wall motion  abnormalities. There is severe concentric left ventricular  hypertrophy. Left ventricular diastolic   parameters are consistent with Grade II diastolic dysfunction  (pseudonormalization). Elevated left ventricular end-diastolic pressure.   2. Right ventricular systolic function is normal. The right ventricular  size is normal. There is normal pulmonary artery systolic pressure. The  estimated right ventricular systolic pressure is 19.0 mmHg.   3. Left atrial size was mildly dilated.   4. The mitral valve is normal in structure. Trivial mitral valve  regurgitation. No evidence of mitral stenosis.   5. The aortic valve is tricuspid. Aortic valve regurgitation is mild.  Mild aortic valve sclerosis is present, with no evidence of aortic valve  stenosis.   6. Aortic dilatation noted. 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   7. The inferior vena cava is normal in size with greater than 50%  respiratory variability, suggesting right atrial pressure of 3 mmHg.   FINDINGS   Left Ventricle: Left ventricular ejection fraction, by estimation, is 60  to 65%. The left ventricle has normal function. The left ventricle has no  regional wall motion abnormalities. The left ventricular internal cavity  size was normal in size. There is   severe concentric left ventricular hypertrophy. Left ventricular  diastolic parameters are consistent with Grade II diastolic dysfunction  (pseudonormalization). Elevated  left ventricular end-diastolic pressure.   Right Ventricle: The right ventricular size is normal. No increase in  right ventricular wall thickness. Right ventricular systolic function is  normal. There is normal pulmonary artery systolic pressure. The tricuspid  regurgitant velocity is 2.00 m/s, and   with an assumed right atrial pressure of 3 mmHg, the estimated right  ventricular systolic pressure is 19.0 mmHg.   Left Atrium:  Left atrial size was mildly dilated.   Right Atrium: Right atrial size was normal in size.   Pericardium: Trivial pericardial effusion is present. The pericardial  effusion is circumferential.   Mitral Valve: The mitral valve is normal in structure. Normal mobility of  the mitral valve leaflets. Trivial mitral valve regurgitation. No evidence  of mitral valve stenosis.   Tricuspid Valve: The tricuspid valve is normal in structure. Tricuspid  valve regurgitation is trivial. No evidence of tricuspid stenosis.   Aortic Valve: The aortic valve is tricuspid. Aortic valve regurgitation is  mild. Aortic regurgitation PHT measures 690 msec. Mild aortic valve  sclerosis is present, with no evidence of aortic valve stenosis.   Pulmonic Valve: The pulmonic valve was normal in structure. Pulmonic valve  regurgitation is trivial. No evidence of pulmonic stenosis.   Aorta: Aortic dilatation noted. There is mild dilatation of the ascending  aorta and of the aortic root measuring 44 mm.   Venous: The inferior vena cava is normal in size with greater than 50%  respiratory variability, suggesting right atrial pressure of 3 mmHg.   IAS/Shunts: No atrial level shunt detected by color flow Doppler.  CT angio chest aorta 10/08/2021  COMPARISON:  Oct 13, 2020.   FINDINGS: Cardiovascular: Grossly stable 4.2 cm ascending thoracic aortic aneurysm is noted. No dissection is noted. Great vessels are widely patent without significant stenosis. Stable cardiomegaly. No pericardial effusion. Atherosclerosis of thoracic aorta is noted. Mild coronary calcifications are noted.   Mediastinum/Nodes: No enlarged mediastinal, hilar, or axillary lymph nodes. Thyroid  gland, trachea, and esophagus demonstrate no significant findings.   Lungs/Pleura: Lungs are clear. No pleural effusion or pneumothorax.   Upper Abdomen: No acute abnormality.   Musculoskeletal: No chest wall abnormality. No acute or  significant osseous findings.   Review of the MIP images confirms the above findings.   IMPRESSION: Grossly stable 4.2 cm ascending thoracic aortic aneurysm. Recommend annual imaging followup by CTA or MRA. This recommendation follows 2010 ACCF/AHA/AATS/ACR/ASA/SCA/SCAI/SIR/STS/SVM Guidelines for the Diagnosis and Management of Patients with Thoracic Aortic Disease. Circulation. 2010; 121: Z733-z630. Aortic aneurysm NOS (ICD10-I71.9).   Aortic Atherosclerosis (ICD10-I70.0).     Electronically Signed   By: Lynwood Landy Raddle M.D.   On: 10/08/2021 14:40  CT angio chest 5/24  EXAM: CT ANGIOGRAPHY CHEST WITH CONTRAST   TECHNIQUE: Multidetector CT imaging of the chest was performed using the standard protocol during bolus administration of intravenous contrast. Multiplanar CT image reconstructions and MIPs were obtained to evaluate the vascular anatomy.   RADIATION DOSE REDUCTION: This exam was performed according to the departmental dose-optimization program which includes automated exposure control, adjustment of the mA and/or kV according to patient size and/or use of iterative reconstruction technique.   CONTRAST:  75mL ISOVUE -370 IOPAMIDOL  (ISOVUE -370) INJECTION 76%   COMPARISON:  10/08/2021   FINDINGS: Cardiovascular: The thoracic aorta is normal in course. There is stable dilation of the thoracic aorta measuring 4.2 cm in diameter in its ascending segment, 3.9 cm in diameter in its proximal descending segment, and 3.2 cm in diameter in its distal descending segment  at the level of the left atrium. No superimposed intramural hematoma or dissection. Mild atherosclerotic calcification. Arch vasculature demonstrates classic anatomic configuration and is widely patent proximally.   Extensive multi-vessel coronary artery calcification. Mild global cardiomegaly, stable since prior examination. No pericardial effusion. Central pulmonary arteries are enlarged in keeping  with changes of pulmonary arterial hypertension, stable since prior examination.   Mediastinum/Nodes: Multiple tiny hypoenhancing nodules are seen within the right thyroid  lobe, unlikely of clinical significance. No follow-up imaging is recommended for these lesions. The esophagus is unremarkable. Small hiatal hernia. No pathologic thoracic adenopathy.   Lungs/Pleura: Mild bibasilar atelectasis. Lungs are otherwise clear. No pneumothorax or pleural effusion.   Upper Abdomen: Stable cyst within the left hepatic lobe. Cholecystectomy has been performed. No acute abnormality within the visualized upper abdomen.   Musculoskeletal: Osseous structures are age-appropriate. No acute bone abnormality.   Review of the MIP images confirms the above findings.   IMPRESSION: 1. Stable dilation of the thoracic aorta measuring 4.2 cm in diameter in its ascending segment. No superimposed intramural hematoma or dissection. Recommend annual imaging followup by CTA or MRA. This recommendation follows 2010 ACCF/AHA/AATS/ACR/ASA/SCA/SCAI/SIR/STS/SVM Guidelines for the Diagnosis and Management of Patients with Thoracic Aortic Disease. Circulation. 2010; 121: [id number]. Aortic aneurysm NOS (ICD10-I71.9) 2. Extensive multi-vessel coronary artery calcification. Mild global cardiomegaly. 3. Morphologic changes in keeping with pulmonary arterial hypertension, stable since prior examination. 4. Small hiatal hernia.   Aortic Atherosclerosis (ICD10-I70.0).     Electronically Signed   By: Dorethia Molt M.D.   On: 10/19/2022 21:01  Echocardiogram 03/28/2023 IMPRESSIONS     1. Left ventricular ejection fraction, by estimation, is 60 to 65%. Left  ventricular ejection fraction by 3D volume is 61 %. The left ventricle has  normal function. The left ventricle has no regional wall motion  abnormalities. There is moderate  concentric left ventricular hypertrophy. Left ventricular diastolic  parameters  are consistent with Grade II diastolic dysfunction  (pseudonormalization). Elevated left atrial pressure.   2. Right ventricular systolic function is normal. The right ventricular  size is normal. There is normal pulmonary artery systolic pressure. The  estimated right ventricular systolic pressure is 23.6 mmHg.   3. Left atrial size was mildly dilated.   4. The mitral valve is normal in structure. Trivial mitral valve  regurgitation.   5. The aortic valve is tricuspid. Aortic valve regurgitation is trivial.   6. Aortic dilatation noted. There is mild dilatation of the aortic root,  measuring 40 mm. There is moderate dilatation of the ascending aorta,  measuring 47 mm.   Comparison(s): Prior images reviewed side by side. The left ventricular  function is unchanged. The left ventricular diastolic function is  unchanged. The ascending aorta appears to be more dilated. Recommend  confirmation with CT or MRI.   FINDINGS   Left Ventricle: Left ventricular ejection fraction, by estimation, is 60  to 65%. Left ventricular ejection fraction by 3D volume is 61 %. The left  ventricle has normal function. The left ventricle has no regional wall  motion abnormalities. The left  ventricular internal cavity size was normal in size. There is moderate  concentric left ventricular hypertrophy. Left ventricular diastolic  parameters are consistent with Grade II diastolic dysfunction  (pseudonormalization). Elevated left atrial pressure.   Right Ventricle: The right ventricular size is normal. Right vetricular  wall thickness was not well visualized. Right ventricular systolic  function is normal. There is normal pulmonary artery systolic pressure.  The tricuspid regurgitant velocity is  2.27  m/s, and with an assumed right atrial pressure of 3 mmHg, the estimated  right ventricular systolic pressure is 23.6 mmHg.   Left Atrium: Left atrial size was mildly dilated.   Right Atrium: Right atrial size  was normal in size.   Pericardium: There is no evidence of pericardial effusion.   Mitral Valve: The mitral valve is normal in structure. Trivial mitral  valve regurgitation.   Tricuspid Valve: The tricuspid valve is normal in structure. Tricuspid  valve regurgitation is trivial.   Aortic Valve: The aortic valve is tricuspid. Aortic valve regurgitation is  trivial.   Pulmonic Valve: The pulmonic valve was grossly normal. Pulmonic valve  regurgitation is trivial. No evidence of pulmonic stenosis.   Aorta: Aortic dilatation noted. There is mild dilatation of the aortic  root, measuring 40 mm. There is moderate dilatation of the ascending  aorta, measuring 47 mm.   Venous: The inferior vena cava was not well visualized.   IAS/Shunts: No atrial level shunt detected by color flow Doppler.    CT angio chest aorta 03/20/2023  IMPRESSION: 1. Aortic atherosclerosis with aneurysmal dilatation of the ascending aorta measuring 4.4 cm. Recommend annual imaging followup by CTA or MRA. This recommendation follows 2010 ACCF/AHA/AATS/ACR/ASA/SCA/SCAI/SIR/STS/SVM Guidelines for the Diagnosis and Management of Patients with Thoracic Aortic Disease. Circulation. 2010; 121: [id number]. Aortic aneurysm NOS (ICD10-I71.9). 2. Distended pulmonary trunk suggesting underlying pulmonary artery hypertension. 3. Cardiomegaly with small pericardial effusion and coronary artery calcifications. 4. Small hiatal hernia.     Electronically Signed   By: Leita Birmingham M.D.   On: 03/26/2023 01:39  Assessment & Plan   1.  Thoracic aortic aneurysm-continues with well-controlled blood pressure.  Chest CT 03/20/2023 showed ascending aorta measuring 4.4 cm CT angio chest 10/08/2021 showed stable aneurysm.  5/24 CT angio chest showed 4.2 cm unchanged thoracic aortic aneurysm. Maintain good blood pressure control. Repeat CT angio chest aorta   Essential hypertension-BP today 117***/44. Continue amlodipine ,  doxazosin , losartan  Heart healthy low-sodium diet-reviewed Increase physical activity as tolerated  Coronary artery disease-stable.  Denies recent exertional chest discomfort.  He underwent stress testing 5/21 which showed EF of 62% no ischemia. Continue aspirin , rosuvastatin , amlodipine  Maintain physical activity  Hyperlipidemia-LDL 5***2 on 02/10/21. Continue rosuvastatin , aspirin  Heart healthy low-sodium high-fiber diet Increase physical activity as tolerated Repeat fasting lipids and LFTs   Fatigue, diastolic dysfunction-activity level stable.  No increased DOE.  Continues with generalized lower extremity swelling.  Repeat echocardiogram Heart healthy low-sodium diet-reviewed Elevate lower extremities when not active-encouraged lower extremity support stockings Repeat echocardiogram  Disposition: Follow-up with Dr. Pietro or me in 12 months.   Josefa HERO. Verdon Ferrante NP-C     04/07/2024, 2:37 PM [facility] [address] Office [phone] Fax [phone]  Notice: This dictation was prepared with Dragon dictation along with smaller phrase technology. Any transcriptional errors that result from this process are unintentional and may not be corrected upon review.  I spent 14*** minutes examining this patient, reviewing medications, and using patient centered shared decision making involving her cardiac care.  Prior to her visit I spent greater than 20 minutes reviewing her past medical history,  medications, and prior cardiac tests.

## 2024-04-07 NOTE — H&P (View-Only) (Signed)
 Cardiology Clinic Note   Patient Name: Isaiah Wilson Date of Encounter: 04/07/2024  Primary Care Provider:  Micheal Wolm ORN, MD Primary Cardiologist:  Redell Shallow, MD  Patient Profile    Isaiah Wilson 89 year old male presents the clinic today for follow-up evaluation of his essential hypertension and hyperlipidemia.  Past Medical History    Past Medical History:  Diagnosis Date   Abnormal EKG    hx left anterior fasicular block on old ekg 2017   Allergic rhinitis    Allergy    Barrett's esophagus    Benign prostatic hypertrophy    Colitis, ischemic    X 2   Complication of anesthesia    SLOW TO AWAKEN AFTER 1 SURGERY 20  YRS AGO   DJD (degenerative joint disease)    SPINE, AND OA   GERD (gastroesophageal reflux disease)    H/O hiatal hernia    HOH (hard of hearing)    BOTH EARS   Hyperlipidemia    no medication per pt.   Hypertension    pt denies says cardura  for bladder   Neuropathy     LEFT HAND AND ARM NUMB ALL THE TIME   Overweight(278.02)    Past Surgical History:  Procedure Laterality Date   2 neck surgeries  2005-2006   Dr. Fate cx decompression, diskectomy,fusion C6-7 allograftWITH PLATE   APPENDECTOMY     EYE SURGERY     bilateral cataract surgerywith lens implant   FOOT SURGERY     right x 3   INNER EAR SURGERY Right    07/2020   LAPAROSCOPIC CHOLECYSTECTOMY  1993   Dr. MYRTIS Salt   left total knee replacement  07/2007   Dr. erskine    right total knee replacement  2012   Dr. Hiram REPINE  1975, May 2013   x3   TONSILLECTOMY  as child   TOTAL HIP ARTHROPLASTY Left 04/18/2018   Procedure: LEFT TOTAL HIP ARTHROPLASTY ANTERIOR APPROACH;  Surgeon: Melodi Lerner, MD;  Location: [facility];  Service: Orthopedics;  Laterality: Left;   TOTAL HIP ARTHROPLASTY Right 03/13/2019   Procedure: TOTAL HIP ARTHROPLASTY ANTERIOR APPROACH;  Surgeon: Melodi Lerner, MD;  Location: [facility];  Service: Orthopedics;  Laterality: Right;     TOTAL KNEE REVISION Left 11/11/2015   Procedure: LEFT KNEE POLYETHELENE REVISION;  Surgeon: Lerner Melodi, MD;  Location: [facility];  Service: Orthopedics;  Laterality: Left;   TOTAL KNEE REVISION Left 12/07/2016   Procedure: LEFT TOTAL KNEE REVISION;  Surgeon: Melodi Lerner, MD;  Location: [facility];  Service: Orthopedics;  Laterality: Left;  Adductor Block    Allergies  No Known Allergies  History of Present Illness    Isaiah Wilson has a PMH of HLD, HTN, thoracic aortic aneurysm, and coronary artery disease.  He had a brain MRI 3/21 which showed moderate chronic microvascular ischemic changes.  His echocardiogram 5/21 showed normal LV function, severe LVH, G2 DD, left atrial enlargement, mild aortic insufficiency, and dilated ascending aorta measuring 40 felt more millimeters.  He underwent nuclear stress test 5/21 which showed an EF of 62% no ischemia.  He had a CTA 5/22 which showed a stable aortic aneurysm measuring 4.2 cm and atherosclerosis including his coronary arteries and aorta.  He had repeat CT angio chest 10/08/2021 which showed stable ascending aortic aneurysm measuring 4.2 cm.  CT angio chest aorta 03/20/2023 showed ascending aorta measuring 4.4 cm.  He was seen in follow-up by Dr. Shallow on  12/09/2020.  During that time reported no dyspnea or chest pain.  He did note dizziness with standing which should improve with sitting.  He felt his dizziness was related to his ear.  He presented to the clinic 12/06/21 for follow-up evaluation and stated he felt tired d/t caring for his spouse.  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  He was somewhat limited in his physical activity due to back pain.  We reviewed his most recent CT and he expressed understanding.  His EKG showed sinus bradycardia with first-degree AV block 54 bpm.  We planned repeat CT angio chest in 1 year, contact Isabel to reach out about respite care, and planned follow-up in 1  year.  He presented to the clinic 03/03/23 for follow-up evaluation and stated he continued to be the primary caregiver for his wife.  He had noticed some lower extremity swelling.  He had not been able to wear lower extremity support stockings.  We reviewed the importance of low-sodium diet.  We reviewed his thoracic aortic aneurysm.  His blood pressure had been well-controlled.  I order a repeat CT angio chest aorta for May and repeat echocardiogram.  Follow-up in 12 months was planned.   He presents to the clinic today for follow-up evaluation and states he has had increased stress recently related to his church.  He reports that his church is dividing.  He continues to care for his wife.  He notes that he occasionally has chest discomfort with increased physical activity.  He also reports some increased fatigue recently.  He attributes this to not being able to sleep well.  He notes that he previously was told that he may have sleep apnea.  He does not want to wear CPAP or wear oral appliance.  We reviewed his previous CT angio chest and aneurysm.  He does not wish to proceed with further diagnostic testing.  He notes that he would not want to undergo surgery for this.  His EKG today shows atrial fibrillation.  We reviewed CVA risk.  He expressed understanding.  I will start him on apixaban  5 mg twice daily.  I will have him come back to the office in 3 to 4 weeks to discuss possibility of DCCV.  I encouraged him to not miss any doses of his blood thinner.  Today he denies chest pain, shortness of breath, lower extremity edema, fatigue, palpitations, melena, hematuria, hemoptysis, diaphoresis, weakness, presyncope, syncope, orthopnea, and PND.   Home Medications    Prior to Admission medications   Medication Sig Start Date End Date Taking? Authorizing Provider  acetaminophen  (TYLENOL ) 650 MG CR tablet Take 650 mg by mouth every 8 (eight) hours as needed for pain.    [provider]   amLODipine  (NORVASC ) 5 MG tablet TAKE ONE TABLET BY MOUTH DAILY 01/08/21   Pietro Redell RAMAN, MD  aspirin  EC 81 MG tablet Take 81 mg by mouth daily.    [provider]  azelastine  (ASTELIN ) 0.1 % nasal spray Place 1 spray into both nostrils 2 (two) times daily as needed (allergies.).     [provider]  clobetasol (TEMOVATE) 0.05 % external solution     [provider]  doxazosin  (CARDURA ) 8 MG tablet Take 1 tablet (8 mg total) by mouth at bedtime. 11/24/21   Burchette, Wolm ORN, MD  EPINEPHrine  0.3 mg/0.3 mL IJ SOAJ injection Inject 0.3 mg into the muscle as needed (allergic reaction).  [provider]  esomeprazole  (NEXIUM ) 40 MG capsule TAKE ONE CAPSULE BY MOUTH DAILY (TAKE ON AN EMPTY STOMACH 30 MINUTES PRIOR TO A MEAL) FOR BARRETT'S ESOPHAGUS 06/17/21   Abran Norleen SAILOR, MD  fluticasone  (FLONASE ) 50 MCG/ACT nasal spray Place 2 sprays into both nostrils daily.     [provider]  loratadine  (CLARITIN ) 10 MG tablet     [provider]  losartan  (COZAAR ) 100 MG tablet TAKE ONE TABLET BY MOUTH DAILY 08/31/21   Burchette, Wolm ORN, MD  montelukast  (SINGULAIR ) 10 MG tablet TAKE ONE TABLET BY MOUTH EVERY NIGHT AT BEDTIME 10/11/21   Burchette, Wolm ORN, MD  mupirocin ointment (BACTROBAN) 2 %  12/26/19   [provider]  rosuvastatin  (CRESTOR ) 40 MG tablet TAKE ONE-HALF TABLET BY MOUTH ONCE A DAY 02/20/21   [provider]  tobramycin-dexamethasone  (TOBRADEX) ophthalmic solution Apply to eye. 12/26/19   [provider]  vancomycin  (VANCOCIN ) 1 g injection     [provider]  zinc  gluconate 50 MG tablet Take 50 mg by mouth daily.    [provider]    Family History    Family History  Problem Relation Age of Onset   Leukemia Mother    Heart disease Father    Heart disease Sister    Colon cancer Neg Hx    Stomach cancer Neg Hx    Rectal cancer Neg Hx    Esophageal cancer Neg Hx    He indicated that his  mother is deceased. He indicated that his father is deceased. He indicated that his sister is alive. He indicated that the status of his neg hx is unknown. He indicated that the status of his other is unknown and reported the following: sibling alive.  Social History    Social History   Socioeconomic History   Marital status: Married    Spouse name: Not on file   Number of children: 2   Years of education: some college   Highest education level: Not on file  Occupational History   Occupation: retired  Tobacco Use   Smoking status: Never   Smokeless tobacco: Never  Vaping Use   Vaping status: Never Used  Substance and Sexual Activity   Alcohol use: No    Alcohol/week: 0.0 standard drinks of alcohol   Drug use: No   Sexual activity: Not on file  Other Topics Concern   Not on file  Social History Narrative   Married   HH 2   2 children; 3 granchildren; 2 great grandchildren   Social Drivers of Corporate Investment Banker Strain: Low Risk  (10/11/2022)   Overall Financial Resource Strain (CARDIA)    Difficulty of Paying Living Expenses: Not hard at all  Food Insecurity: Low Risk  (09/29/2023)   Received from [facility]   Hunger Vital Sign    Within the past 12 months, you worried that your food would run out before you got money to buy more: Never true    Within the past 12 months, the food you bought just didn't last and you didn't have money to get more. : Never true  Transportation Needs: No Transportation Needs (09/29/2023)   Received from [facility]    In the past 12 months, has lack of reliable transportation kept you from medical appointments, meetings, work or from getting things needed for daily living? : No  Physical Activity: Inactive (05/03/2021)   Exercise Vital Sign    Days of  Exercise per Week: 0 days    Minutes of Exercise per Session: 0 min  Stress: Stress Concern Present (05/03/2021)   [employer] of Occupational Health -  Occupational Stress Questionnaire    Feeling of Stress : To some extent  Social Connections: Socially Integrated (06/11/2019)   Social Connection and Isolation Panel    Frequency of Communication with Friends and Family: More than three times a week    Frequency of Social Gatherings with Friends and Family: Once a week    Attends Religious Services: More than 4 times per year    Active Member of [employer] or Organizations: Yes    Attends Engineer, Structural: More than 4 times per year    Marital Status: Married  Catering Manager Violence: Not on file     Review of Systems    General:  No chills, fever, night sweats or weight changes.  Cardiovascular:  No chest pain, dyspnea on exertion, edema, orthopnea, palpitations, paroxysmal nocturnal dyspnea. Dermatological: No rash, lesions/masses Respiratory: No cough, dyspnea Urologic: No hematuria, dysuria Abdominal:   No nausea, vomiting, diarrhea, bright red blood per rectum, melena, or hematemesis Neurologic:  No visual changes, wkns, changes in mental status. All other systems reviewed and are otherwise negative except as noted above.  Physical Exam    VS:  There were no vitals taken for this visit. , BMI There is no height or weight on file to calculate BMI. GEN: Well nourished, well developed, in no acute distress. HEENT: normal. Neck: Supple, no JVD, carotid bruits, or masses. Cardiac: Irregularly irregular, no murmurs, rubs, or gallops. No clubbing, cyanosis, generalized bilateral nonpitting lower extremity edema.  Radials/DP/PT 2+ and equal bilaterally.  Respiratory:  Respirations regular and unlabored, clear to auscultation bilaterally. GI: Soft, nontender, nondistended, BS + x 4. MS: no deformity or atrophy. Skin: warm and dry, no rash. Neuro:  Strength and sensation are intact. Psych: Normal affect.  Accessory Clinical Findings    Recent Labs: 10/16/2023: ALT 20; BUN 14; Creatinine, Ser 0.97; Potassium 4.2; Sodium 141    Recent Lipid Panel    Component Value Date/Time   CHOL 108 10/16/2023 1506   CHOL 118 02/10/2021 1356   TRIG 245.0 (H) 10/16/2023 1506   HDL 35.10 (L) 10/16/2023 1506   HDL 39 (L) 02/10/2021 1356   CHOLHDL 3 10/16/2023 1506   VLDL 49.0 (H) 10/16/2023 1506   LDLCALC 24 10/16/2023 1506   LDLCALC 52 02/10/2021 1356   LDLDIRECT 101.0 11/16/2016 0737    ECG personally reviewed by me today-EKG Interpretation Date/Time:  Tuesday April 09 2024 14:06:12 EST Ventricular Rate:  88 PR Interval:    QRS Duration:  90 QT Interval:  374 QTC Calculation: 452 R Axis:   -58  Text Interpretation: Atrial fibrillation Left anterior fascicular block Confirmed by Emelia Hazy [phone]) on 04/09/2024 2:22:55 PM     EKG 12/06/2021  sinus bradycardia with first-degree AV block right superior axis deviation inferior infarct undetermined age is 54 bpm- No acute changes  Echocardiogram 10/07/2019 IMPRESSIONS     1. Left ventricular ejection fraction, by estimation, is 60 to 65%. The  left ventricle has normal function. The left ventricle has no regional  wall motion abnormalities. There is severe concentric left ventricular  hypertrophy. Left ventricular diastolic   parameters are consistent with Grade II diastolic dysfunction  (pseudonormalization). Elevated left ventricular end-diastolic pressure.   2. Right ventricular systolic function is normal. The right ventricular  size is normal. There is normal pulmonary artery  systolic pressure. The  estimated right ventricular systolic pressure is 19.0 mmHg.   3. Left atrial size was mildly dilated.   4. The mitral valve is normal in structure. Trivial mitral valve  regurgitation. No evidence of mitral stenosis.   5. The aortic valve is tricuspid. Aortic valve regurgitation is mild.  Mild aortic valve sclerosis is present, with no evidence of aortic valve  stenosis.   6. Aortic dilatation noted. There is mild dilatation of the ascending  aorta  and of the aortic root measuring 44 mm and 41mm respectively.   7. The inferior vena cava is normal in size with greater than 50%  respiratory variability, suggesting right atrial pressure of 3 mmHg.   FINDINGS   Left Ventricle: Left ventricular ejection fraction, by estimation, is 60  to 65%. The left ventricle has normal function. The left ventricle has no  regional wall motion abnormalities. The left ventricular internal cavity  size was normal in size. There is   severe concentric left ventricular hypertrophy. Left ventricular  diastolic parameters are consistent with Grade II diastolic dysfunction  (pseudonormalization). Elevated left ventricular end-diastolic pressure.   Right Ventricle: The right ventricular size is normal. No increase in  right ventricular wall thickness. Right ventricular systolic function is  normal. There is normal pulmonary artery systolic pressure. The tricuspid  regurgitant velocity is 2.00 m/s, and   with an assumed right atrial pressure of 3 mmHg, the estimated right  ventricular systolic pressure is 19.0 mmHg.   Left Atrium: Left atrial size was mildly dilated.   Right Atrium: Right atrial size was normal in size.   Pericardium: Trivial pericardial effusion is present. The pericardial  effusion is circumferential.   Mitral Valve: The mitral valve is normal in structure. Normal mobility of  the mitral valve leaflets. Trivial mitral valve regurgitation. No evidence  of mitral valve stenosis.   Tricuspid Valve: The tricuspid valve is normal in structure. Tricuspid  valve regurgitation is trivial. No evidence of tricuspid stenosis.   Aortic Valve: The aortic valve is tricuspid. Aortic valve regurgitation is  mild. Aortic regurgitation PHT measures 690 msec. Mild aortic valve  sclerosis is present, with no evidence of aortic valve stenosis.   Pulmonic Valve: The pulmonic valve was normal in structure. Pulmonic valve  regurgitation is trivial. No  evidence of pulmonic stenosis.   Aorta: Aortic dilatation noted. There is mild dilatation of the ascending  aorta and of the aortic root measuring 44 mm.   Venous: The inferior vena cava is normal in size with greater than 50%  respiratory variability, suggesting right atrial pressure of 3 mmHg.   IAS/Shunts: No atrial level shunt detected by color flow Doppler.  CT angio chest aorta 10/08/2021  COMPARISON:  Oct 13, 2020.   FINDINGS: Cardiovascular: Grossly stable 4.2 cm ascending thoracic aortic aneurysm is noted. No dissection is noted. Great vessels are widely patent without significant stenosis. Stable cardiomegaly. No pericardial effusion. Atherosclerosis of thoracic aorta is noted. Mild coronary calcifications are noted.   Mediastinum/Nodes: No enlarged mediastinal, hilar, or axillary lymph nodes. Thyroid  gland, trachea, and esophagus demonstrate no significant findings.   Lungs/Pleura: Lungs are clear. No pleural effusion or pneumothorax.   Upper Abdomen: No acute abnormality.   Musculoskeletal: No chest wall abnormality. No acute or significant osseous findings.   Review of the MIP images confirms the above findings.   IMPRESSION: Grossly stable 4.2 cm ascending thoracic aortic aneurysm. Recommend annual imaging followup by CTA or MRA. This recommendation follows 2010  ACCF/AHA/AATS/ACR/ASA/SCA/SCAI/SIR/STS/SVM Guidelines for the Diagnosis and Management of Patients with Thoracic Aortic Disease. Circulation. 2010; 121: Z733-z630. Aortic aneurysm NOS (ICD10-I71.9).   Aortic Atherosclerosis (ICD10-I70.0).     Electronically Signed   By: Lynwood Landy Raddle M.D.   On: 10/08/2021 14:40  CT angio chest 5/24  EXAM: CT ANGIOGRAPHY CHEST WITH CONTRAST   TECHNIQUE: Multidetector CT imaging of the chest was performed using the standard protocol during bolus administration of intravenous contrast. Multiplanar CT image reconstructions and MIPs were obtained to evaluate  the vascular anatomy.   RADIATION DOSE REDUCTION: This exam was performed according to the departmental dose-optimization program which includes automated exposure control, adjustment of the mA and/or kV according to patient size and/or use of iterative reconstruction technique.   CONTRAST:  75mL ISOVUE -370 IOPAMIDOL  (ISOVUE -370) INJECTION 76%   COMPARISON:  10/08/2021   FINDINGS: Cardiovascular: The thoracic aorta is normal in course. There is stable dilation of the thoracic aorta measuring 4.2 cm in diameter in its ascending segment, 3.9 cm in diameter in its proximal descending segment, and 3.2 cm in diameter in its distal descending segment at the level of the left atrium. No superimposed intramural hematoma or dissection. Mild atherosclerotic calcification. Arch vasculature demonstrates classic anatomic configuration and is widely patent proximally.   Extensive multi-vessel coronary artery calcification. Mild global cardiomegaly, stable since prior examination. No pericardial effusion. Central pulmonary arteries are enlarged in keeping with changes of pulmonary arterial hypertension, stable since prior examination.   Mediastinum/Nodes: Multiple tiny hypoenhancing nodules are seen within the right thyroid  lobe, unlikely of clinical significance. No follow-up imaging is recommended for these lesions. The esophagus is unremarkable. Small hiatal hernia. No pathologic thoracic adenopathy.   Lungs/Pleura: Mild bibasilar atelectasis. Lungs are otherwise clear. No pneumothorax or pleural effusion.   Upper Abdomen: Stable cyst within the left hepatic lobe. Cholecystectomy has been performed. No acute abnormality within the visualized upper abdomen.   Musculoskeletal: Osseous structures are age-appropriate. No acute bone abnormality.   Review of the MIP images confirms the above findings.   IMPRESSION: 1. Stable dilation of the thoracic aorta measuring 4.2 cm in diameter in  its ascending segment. No superimposed intramural hematoma or dissection. Recommend annual imaging followup by CTA or MRA. This recommendation follows 2010 ACCF/AHA/AATS/ACR/ASA/SCA/SCAI/SIR/STS/SVM Guidelines for the Diagnosis and Management of Patients with Thoracic Aortic Disease. Circulation. 2010; 121: [id number]. Aortic aneurysm NOS (ICD10-I71.9) 2. Extensive multi-vessel coronary artery calcification. Mild global cardiomegaly. 3. Morphologic changes in keeping with pulmonary arterial hypertension, stable since prior examination. 4. Small hiatal hernia.   Aortic Atherosclerosis (ICD10-I70.0).     Electronically Signed   By: Dorethia Molt M.D.   On: 10/19/2022 21:01  Echocardiogram 03/28/2023 IMPRESSIONS     1. Left ventricular ejection fraction, by estimation, is 60 to 65%. Left  ventricular ejection fraction by 3D volume is 61 %. The left ventricle has  normal function. The left ventricle has no regional wall motion  abnormalities. There is moderate  concentric left ventricular hypertrophy. Left ventricular diastolic  parameters are consistent with Grade II diastolic dysfunction  (pseudonormalization). Elevated left atrial pressure.   2. Right ventricular systolic function is normal. The right ventricular  size is normal. There is normal pulmonary artery systolic pressure. The  estimated right ventricular systolic pressure is 23.6 mmHg.   3. Left atrial size was mildly dilated.   4. The mitral valve is normal in structure. Trivial mitral valve  regurgitation.   5. The aortic valve is tricuspid. Aortic valve regurgitation is trivial.  6. Aortic dilatation noted. There is mild dilatation of the aortic root,  measuring 40 mm. There is moderate dilatation of the ascending aorta,  measuring 47 mm.   Comparison(s): Prior images reviewed side by side. The left ventricular  function is unchanged. The left ventricular diastolic function is  unchanged. The ascending aorta  appears to be more dilated. Recommend  confirmation with CT or MRI.   FINDINGS   Left Ventricle: Left ventricular ejection fraction, by estimation, is 60  to 65%. Left ventricular ejection fraction by 3D volume is 61 %. The left  ventricle has normal function. The left ventricle has no regional wall  motion abnormalities. The left  ventricular internal cavity size was normal in size. There is moderate  concentric left ventricular hypertrophy. Left ventricular diastolic  parameters are consistent with Grade II diastolic dysfunction  (pseudonormalization). Elevated left atrial pressure.   Right Ventricle: The right ventricular size is normal. Right vetricular  wall thickness was not well visualized. Right ventricular systolic  function is normal. There is normal pulmonary artery systolic pressure.  The tricuspid regurgitant velocity is 2.27  m/s, and with an assumed right atrial pressure of 3 mmHg, the estimated  right ventricular systolic pressure is 23.6 mmHg.   Left Atrium: Left atrial size was mildly dilated.   Right Atrium: Right atrial size was normal in size.   Pericardium: There is no evidence of pericardial effusion.   Mitral Valve: The mitral valve is normal in structure. Trivial mitral  valve regurgitation.   Tricuspid Valve: The tricuspid valve is normal in structure. Tricuspid  valve regurgitation is trivial.   Aortic Valve: The aortic valve is tricuspid. Aortic valve regurgitation is  trivial.   Pulmonic Valve: The pulmonic valve was grossly normal. Pulmonic valve  regurgitation is trivial. No evidence of pulmonic stenosis.   Aorta: Aortic dilatation noted. There is mild dilatation of the aortic  root, measuring 40 mm. There is moderate dilatation of the ascending  aorta, measuring 47 mm.   Venous: The inferior vena cava was not well visualized.   IAS/Shunts: No atrial level shunt detected by color flow Doppler.    CT angio chest aorta  03/20/2023  IMPRESSION: 1. Aortic atherosclerosis with aneurysmal dilatation of the ascending aorta measuring 4.4 cm. Recommend annual imaging followup by CTA or MRA. This recommendation follows 2010 ACCF/AHA/AATS/ACR/ASA/SCA/SCAI/SIR/STS/SVM Guidelines for the Diagnosis and Management of Patients with Thoracic Aortic Disease. Circulation. 2010; 121: [id number]. Aortic aneurysm NOS (ICD10-I71.9). 2. Distended pulmonary trunk suggesting underlying pulmonary artery hypertension. 3. Cardiomegaly with small pericardial effusion and coronary artery calcifications. 4. Small hiatal hernia.     Electronically Signed   By: Leita Birmingham M.D.   On: 03/26/2023 01:39  Assessment & Plan   1.  Atrial fibrillation-Notes increased fatigue.  He attributes this in part due to poor sleep habits.  He notes that he would not want to have CPAP or oral appliance for OSA.  We reviewed risk for stroke.  He agrees to take DOAC. Start Eliquis  5 mg twice daily Avoid triggers caffeine, chocolate, EtOH, dehydration excetra. Will plan for DCCV at follow-up in 3-4 weeks.  Thoracic aortic aneurysm-continues with well-controlled blood pressure.  Chest CT 03/20/2023 showed ascending aorta measuring 4.4 cm CT angio chest 10/08/2021 showed stable aneurysm.  5/24 CT angio chest showed 4.2 cm unchanged thoracic aortic aneurysm. Maintain good blood pressure control. Wishes to defer CT angio chest aorta- does not want to have surgery  Essential hypertension-BP today 118/58 Continue amlodipine , doxazosin , losartan   Heart healthy low-sodium diet-reviewed Increase physical activity as tolerated  Coronary artery disease-stable.  Denies recent exertional chest discomfort.  He underwent stress testing 5/21 which showed EF of 62% no ischemia. Continue aspirin , rosuvastatin , amlodipine  Maintain physical activity  Hyperlipidemia-LDL 24 on 10/16/23 Continue rosuvastatin , aspirin  Heart healthy low-sodium high-fiber  diet   Fatigue, diastolic dysfunction-Notes increased fatigue and continues to care for his wife.SABRA  Continues with generalized lower extremity swelling.  Repeat echocardiogram Heart healthy low-sodium diet-reviewed Elevate lower extremities when not active-encouraged lower extremity support stockings Repeat echocardiogram when clinically indicated  Disposition: Follow-up with Dr. Pietro or me in 3-4 weeks   Josefa HERO. Lovell Roe NP-C     04/07/2024, 2:37 PM [facility] [address] Office [phone] Fax [phone]  Notice: This dictation was prepared with Dragon dictation along with smaller phrase technology. Any transcriptional errors that result from this process are unintentional and may not be corrected upon review.  I spent 14 minutes examining this patient, reviewing medications, and using patient centered shared decision making involving her cardiac care.  Prior to her visit I spent greater than 20 minutes reviewing her past medical history,  medications, and prior cardiac tests.

## 2024-04-09 ENCOUNTER — Other Ambulatory Visit (HOSPITAL_COMMUNITY): Payer: Self-pay

## 2024-04-09 ENCOUNTER — Encounter: Payer: Self-pay | Admitting: General Practice

## 2024-04-09 ENCOUNTER — Ambulatory Visit: Attending: General Practice | Admitting: General Practice

## 2024-04-09 VITALS — BP 118/58 | HR 88 | Ht 70.0 in | Wt 259.0 lb

## 2024-04-09 DIAGNOSIS — I2510 Atherosclerotic heart disease of native coronary artery without angina pectoris: Secondary | ICD-10-CM | POA: Diagnosis not present

## 2024-04-09 DIAGNOSIS — E785 Hyperlipidemia, unspecified: Secondary | ICD-10-CM

## 2024-04-09 DIAGNOSIS — I5030 Unspecified diastolic (congestive) heart failure: Secondary | ICD-10-CM

## 2024-04-09 DIAGNOSIS — I10 Essential (primary) hypertension: Secondary | ICD-10-CM | POA: Diagnosis not present

## 2024-04-09 DIAGNOSIS — I712 Thoracic aortic aneurysm, without rupture, unspecified: Secondary | ICD-10-CM | POA: Diagnosis not present

## 2024-04-09 DIAGNOSIS — I4891 Unspecified atrial fibrillation: Secondary | ICD-10-CM

## 2024-04-09 DIAGNOSIS — R5383 Other fatigue: Secondary | ICD-10-CM | POA: Diagnosis not present

## 2024-04-09 MED ORDER — APIXABAN 5 MG PO TABS
5.0000 mg | ORAL_TABLET | Freq: Two times a day (BID) | ORAL | 0 refills | Status: DC
  Filled 2024-04-09: qty 60, 30d supply, fill #0

## 2024-04-09 NOTE — Patient Instructions (Addendum)
 Medication Instructions:  START Eliquis 5mg  Take 1 tablet once a day  *If you need a refill on your cardiac medications before your next appointment, please call your pharmacy*  Lab Work: None ordered If you have labs (blood work) drawn today and your tests are completely normal, you will receive your results only by: MyChart Message (if you have MyChart) OR A paper copy in the mail If you have any lab test that is abnormal or we need to change your treatment, we will call you to review the results.  Testing/Procedures: None ordered  Follow-Up: At [facility], you and your health needs are our priority.  As part of our continuing mission to provide you with exceptional heart care, our providers are all part of one team.  This team includes your primary Cardiologist (physician) and Advanced Practice Providers or APPs (Physician Assistants and Nurse Practitioners) who all work together to provide you with the care you need, when you need it.  Your next appointment:   3-4 week(s)  Provider:   Redell Shallow, MD or Josefa Beauvais, NP   We recommend signing up for the patient portal called MyChart.  Sign up information is provided on this After Visit Summary.  MyChart is used to connect with patients for Virtual Visits (Telemedicine).  Patients are able to view lab/test results, encounter notes, upcoming appointments, etc.  Non-urgent messages can be sent to your provider as well.   To learn more about what you can do with MyChart, go to [email or web address].   Other Instructions STAY HYDRATED  Please try to avoid these triggers: Do not use any products that have nicotine or tobacco in them. These include cigarettes, e-cigarettes, and chewing tobacco. If you need help quitting, ask your doctor. Eat heart-healthy foods. Talk with your doctor about the right eating plan for you. Exercise regularly as told by your doctor. Stay hydrated Do not drink alcohol, Caffeine or  chocolate. Lose weight if you are overweight. Do not use drugs, including cannabis

## 2024-04-15 ENCOUNTER — Telehealth: Payer: Self-pay | Admitting: Cardiology

## 2024-04-15 DIAGNOSIS — Z01818 Encounter for other preprocedural examination: Secondary | ICD-10-CM

## 2024-04-15 DIAGNOSIS — I4891 Unspecified atrial fibrillation: Secondary | ICD-10-CM

## 2024-04-15 NOTE — Telephone Encounter (Signed)
 Pt called and stated that Isaiah Wilson to him about having a procedure for his Afib.  He as undecided at the time but would like him to go ahead and get it set up.  He decided he would like to go head and move forward with it  Best number [phone]

## 2024-04-16 NOTE — Progress Notes (Signed)
 04/19/2024    Location: [facility] at [place]    Precautions: Gloves were worn during the patient's evaluation. SARS-CoV-2                         precautions were followed.  Provider: Camellia EMERSON Milliner, M.D., M.S., F.A.C.S.  Visit Type:  Return Adult  Encounter Date: April 19, 2024  Patient Name:  Isaiah Wilson     Medical Record Number (MRN): [id number]                          Sex:  male  Date of Birth:  Jun 17, 1934                                Age: 89 y.o.  Phone number(s): [phone] (home)                       [phone] (mobile)  Primary Dr:  Wolm LELON Scarlet, MD  Referring Provider: No ref. provider found  Person(s) accompanying patient: wife  Interpreter: None     CC: 3-years 58-month postop: R CWU tympanomastoidectomy  Chief Complaint  Patient presents with  . Follow-up    6 month     Patient Active Problem List  Diagnosis  . Suppurative otitis media of right ear without rupture of tympanic membrane  . Eustachian tube dysfunction, bilateral  . Sensorineural hearing loss (SNHL) of left ear with restricted hearing of right ear  . Chronic diffuse otitis externa of right ear  . Mixed conductive and sensorineural hearing loss of right ear with restricted hearing of left ear  . Acquired trigger finger of right ring finger  . Primary hypertension  . History of right mastoidectomy  . Asymmetrical hearing loss  . Orthostatic hypotension  . Vestibular hypofunction of both ears      Isaiah Wilson is a 89 y.o. male who is here today with the chief complaint of follow up after undergoing a R canal wall up tympanomastoidectomy to treat chronic mastoiditis.   He developed chronic right mastoiditis and  underwent a right canal wall up tympanomastoidectomy with placement of a tube in March 2022.  The procedure was delayed due to his wife's illness.  His right tube required replacement.        06-27-19 HPI:    The patient is  well-known to me.  This is his first visit to see me at the Otolaryngology Clinic in [place].    The patient has had a lifelong history of noise exposure.  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  He has complained of bilateral tinnitus and losing his hearing in the left ear dating back to at least June  2002.  He complains of constant nonpulsatile tinnitus and a fluid feeling in his ear at that time.  He was age 65.  Audiometric testing on June 01, 1998 showed bilateral moderate to moderately severe symmetric high-frequency sensorineural hearing losses  with an SRT of 20 DB right ear and 10 DB left ear with 100% discrimination bilaterally.  He did have some acoustic reflex decay to 1000 Hz in the right ear after 4 seconds in the left ear after 6 seconds.    In 2002 he was treated with a Sterapred Dosepak for  right aural fullness.  He was recommended for binaural amplification.    His last visit to see me was on August 08, 2018 at age 89.  I had placed a right T-tube 6 weeks earlier.  This was done as he had had a long history of chronic bilateral eustachian tube dysfunction.  Following the T-tube, right ear, he closed his air-bone  gap.  He continued to have moderately severe to severe downsloping high-frequency sensorineural hearing loss and was counseled to continue to wear his binaural digital BTE hearing aids.    An audiogram was performed on August 08, 2018 which showed moderately severe to profound bilateral high-frequency sensorineural hearing losses with an SRT of 25 DB right ear and 20 DB left ear with 60% discrimination bilaterally.  His right T-tube was  in place at that time.    Today, patient reports some intermittent drainage from his right ear and continued bilateral hearing loss.  He denies otalgia, tinnitus or vertigo.  He has not been wearing hearing aids.  He wanted to have his ears cleaned and his T-tube checked.        11-26-19: Visit  with Chiquita Kurk, PA    The patient was seen by Chiquita on 11/26/2019 for drainage from his right ear.  He had had a T-tube inserted in the right ear by myself in February 2020.  His right ear was cultured and he was started on Ciprodex .  The culture grew MRSA.  The patient was  continued on Ciprodex  and started on oral Septra.        12-26-19 HPI:     Today, the patient reports that his right ear began to drain last night.  He had had a MRSA infection documented on a culture recently.  He has been started on oral Septra.  He right ear continues to drain.  He denied any otalgia or tinnitus.  He has  noted decreased hearing in the right ear.  He is known to have a right T-tube in place.        02-06-20 HPI:    Today, the patient reports tolerating the oral Septra DS and topical Tobradex drops, right ear.  However, he began to drain again from his right ear recently.  He reports that the fluid gushed out of his right ear  He denied otalgia, tinnitus or vertigo.   He does report hearing loss.    The patient continues to have repeat MRSA infections.  He responds to medication and then relapses.        03-20-20 HPI:    Today, the patient reports that his right ear is continuing to drain intermittently.  I have scheduled him for a right canal wall up tympanomastoidectomy to treat chronic MRSA mastoiditis.    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  He denied otorrhea, tinnitus, vertigo, headache, periauricular swelling, or any other focal neurologic  signs or symptoms related to his ear today.  He does use a cane to help him ambulate.        04-30-20 HPI:    During the patient's last visit, due to persistent MRSA infection of his right ear, the patient was placed on 10 days of TobraDex topical drops and 6 weeks of oral doxycycline.  Today, he reports that his right ear has stopped  draining.  He has been tolerating  the oral antibiotic and the drops.  He  reported any some crusting from the right external auditory canal.  He denied any vertigo or tinnitus.    He is still caring for his wife Clara who has developed a foot drop and is being worked up by neurology.    The patient has developed a right ring finger trigger deformity.        07/02/2020 HPI:    Today, the patient reports that his right ear continues to drain.  His last otorrhea was 3 weeks ago.  He denied any otalgia, tinnitus or vertigo.  He reports that the otorrhea 3 weeks ago dripped off his right earlobe.        08/19/2020 - right canal wall up tympanomastoidectomy with placement of a Touma tube    PREOPERATIVE DIAGNOSES:     1.  Chronic right mastoiditis with chronic suppurative otitis media unresponsive to conservative medical management  2.  Moderately severe to severe mixed mid and high-frequency hearing loss, right ear  3.  Moderately severe to severe mid and high-frequency sensorineural hearing loss, left ear        POSTOPERATIVE DIAGNOSIS:     1.  Chronic right mastoiditis with chronic suppurative otitis media unresponsive to conservative medical management  2.  Moderately severe to severe mixed mid and high-frequency hearing loss, right ear  3.  Moderately severe to severe mid and high-frequency sensorineural hearing loss, left ear     PROCEDURE(S):     1. 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   Patient has a history of chronic eustachian tube dysfunction, right ear, and currently has a patent T-tube in place that has been stable for several  years.  In June, July and September of 2021 the patient grew MRSA from his right ear.  He finished  taking oral doxycycline for almost the past 6 weeks and using TobraDex drops.  He failed Septra DS.    A CT scan of his temporal bones documented chronic right mastoiditis and otitis media.   He has had several orthopedic procedures and  recently underwent a left hip replacement.  I am concerned that he could seed his hip implant from his chronic right mastoiditis.      He had been recommended for right canal wall up tympanomastoidectomy to treat chronic right mastoiditis caused by recurrent MRSA infections.  He postponed the procedure due to the fact that he is the sole caretaker for his wife, Valentin, who has been  developing a chronic progressive neurologic disorder with a left foot drop and Wegener's granulomatosis affecting her lungs and kidneys.     His ear has been draining on and off and he reports that he began to drain again from his right ear yesterday.  He was recommended for a right canal wall up tympanomastoidectomy, possible ossiculoplasty, possible modified radical mastoidectomy.  He  would like to proceed with the operation.  Risks, complications, and alternatives of the procedure were explained to the patient.  Questions were invited and answered.  Informed consent was provided, signed, and witnessed.  Preoperative teaching  and counseling were provided.        FINDINGS:      --Right mastoid, attic and middle ear completely filled  with chronic granulation tissue and fibroreactive tissue. Ossicles were engulfed in disease but were intact and mobile.  Middle ear mucosa was removed.  --Orange purulent fluid was draining from the right ear canal after the patient had been intubated and positioned for the procedure.  A culture was sent for aerobic anaerobic bacteria.  --1.0 cm of dura was exposed at the tegmen mastoideum posteriorly and close to the sinodural angle.  The exposed dura was covered  by a temporalis fascia graft.  --Facial nerve not exposed and stimulated throughout the procedure.  --Chorda tympani nerve was dissected free and preserved.    --Ossicles were intact and mobile but cleaned of chronic disease.    --Previously placed T-tube was removed and replaced with a  Touma tube  --A right canal wall up procedure was performed.  --No complications.        08/27/2020 HPI: First postoperative visit with Chiquita Kurk, PA for suture removal    The patient's right postauricular incision was healing well without signs of infection.  Chiquita removed his sutures without difficulty.  He was continuing to have some drainage from the right ear.  This would have been expected as some Gelfoam packing  was placed both in his mastoid as well as in his middle ear space.  His intraoperative culture grew corynebacteria.        09/18/2020 HPI: Second postoperative visit with Chiquita Kurk, PA    The patient reported continued drainage from his right ear however the drainage had stopped for 1 week prior to seeing [facility].  He did not report any pain and he had stopped TobraDex drops.  He reported that his hearing was decreased in the right ear.    Chiquita was able to culture brown mucoid drainage from the right ear, and the culture grew 3+ corynebacteria.        10/09/2020: 51 days postop, right canal wall up tympanomastoidectomy with tube    Today, the patient reports that he is continuing to have some intermittent drainage from the right ear.  He has been using TobraDex drops but has stopped the oral antibiotic.  He denied any otalgia, tinnitus or vertigo.  He does report that he is not  able to hear much from the right ear.  He denied any other focal neurologic signs or symptoms.    Chiquita performed a culture on 09/18/2020.  He grew a corynebacteria species.  See the report below.    10/29/2020 HPI: 2 months plus postop, right canal wall up tympanomastoidectomy with tube    During the  patient's last visit, he was continuing to report some intermittent otorrhea from his right ear.  He was placed on vancomycin  drops from [facility] in [place], [place] .    Today, he reports that his right ear stopped draining but his hearing has not improved in the right ear.  He reports some orthostatic hypotension symptoms.  He is on amlodipine , losartan , and Cardura .  He may need to have his medications adjusted.    He has not been wearing his hearing aids due to the previous drainage from the right ear.  He denied any otorrhea, otalgia, or true rotational vertigo.  He does walk with a cane.    02/12/2021 HPI: 6 months postoperative, right canal wall up tympanomastoidectomy    Today, the patient reports that he has not been hearing well from his right ear.  He was having difficulty hearing the audiologist today.  He does not report any  otorrhea, otalgia, or vertigo.  He does have tinnitus in the right ear.    On February 01, 2021 he fell due to being dizzy.  He lost his balance and cracked a marble table.    He has been having some bradycardia.  His low heart rate today was 42 with a stable blood pressure.  I recommended that he contact his cardiologist, Dr. Pietro in [place], [place] .    He was awake and alert today and walking with a cane.        04/02/2021 HPI: 7 months postop right canal wall up tympanomastoidectomy    During the patient's last visit 6 weeks ago, he underwent a revision Paparella type I tube, right ear.    Today, the patient reports that he feels as if his right ear is moist.  He also thinks that his skin rash of his face is due to his right ear.  He has seen a dermatologist who thinks that his facial rash is related to some type of aftershave lotion.    Patient does not report any frank otorrhea, otalgia, tinnitus or vertigo.  He continues to report that he does not hear from his right ear.  He does wear hearing aid in the left ear.       05/14/2021 HPI: 8 months postop, right canal wall up tympanomastoidectomy    During the patient's last visit, he was found to have some low-grade chronic myringitis specifically around his Paparella type 1 tube.  A culture grew Staph epidermidis and turicella.  His right ear was treated with TobraDex ophthalmic drops twice daily.    Today, the patient reports that his right ear has stopped draining.  His right ear has become sensitive to sounds and to his hearing aid.  He stopped wearing the aid in the right ear.  He did not report report any other focal neurologic signs or symptoms  today.    His wife, Valentin, has been ill with diarrhea recently.        11/12/2021 HPI: 10-months postop: Right canal wall up tympanomastoidectomy    Today, the patient reports loud tinnitus in the right ear which is worse when there is loud sound or many people talking to him.  He denied otorrhea, otalgia or vertigo.        15-24 HPI: 67-months postop: R CWU tympanomastoidectomy    Today, the patient reports that he continues not to hear well from his left ear.  [facility]  He was to [facility] with him but forgot it at home.  He denied any otorrhea, otalgia, or vertigo.  He does  complain of orthostatic hypotension.   09/09/2022  HPI: 30-months postop: R CWU tympanomastoidectomy  Today, the patient reports that he is doing well.  He denied any otorrhea, otalgia, or vertigo.  He was here to have both ears checked and cleaned.   12/16/2022  HPI: 20-months postop: R CWU tympanomastoidectomy  Today, the patient reports he is doing pretty well. No otalgia or otorrhea. Chronic tinnitus.   He is having some constant dizziness, without vertigo,  and feeling off-balance. Worse when walking around. Has not had any formal vestibular evaluation.    03/17/2023  HPI: 30-months postop: R CWU tympanomastoidectomy  Today, the patient reports that he is not hearing  well typically from his left ear.  He has been having difficulty obtaining hearing aids through the [employer] in [place].  He denied any otorrhea, otalgia, or vertigo.     06/30/2023 HPI: 2-years 69-months postop: R CWU tympanomastoidectomy  Today, the patient reports that he is stable from an ear standpoint.  He does feel some fullness in his right ear but denied any otorrhea, otalgia or vertigo.  He does not hear well from the right ear.  During the last several days he has developed redness and swelling of his left lower eyelid.  His son had a similar episode recently.  He has been using some over-the-counter eyedrops.  He has not been in any oral antibiotics or any topical antibiotic ophthalmic drops.  His left lower eyelid is causing some discomfort.  He does not report any change in his vision.    09/29/2023 HPI: : 3-years 46-month postop: R CWU tympanomastoidectomy  Today, the patient reports that he feels as if he has fluid in his right ear.  He was interested in having a myringotomy and tube if needed.  He denied any otorrhea, otalgia, or vertigo.  He is going to obtain new hearing aids from the [facility].  It sounds as if they are going to fit him with [employer] hearing aids.    04/19/2024 HPI:  3-years 47-month postop: R CWU tympanomastoidectomy  Today, the patient reports that he was asymptomatic.  He denied otorrhea, otalgia, tinnitus or vertigo.  He is walking with a cane.  He fell recently with some minor injury to his left elbow and right knee.    No Known Allergies    Current Outpatient Medications on File Prior to Visit  Medication Sig Dispense Refill  . acetaminophen  (TYLENOL ) 500 mg tablet Take 1,000 mg by mouth 2 (two) times a day.    . amLODIPine  (NORVASC ) 5 mg tablet Take 5 mg by mouth nightly.    . apixaban  (ELIQUIS ) 5 mg tab Take 5 mg by mouth 2 (two) times a day.    . cetirizine (ZyrTEC) 10 mg tablet Take 10 mg by mouth daily.    . diphenhydrAMINE   (BENADRYL ) 25 mg tablet Take 25 mg by mouth nightly. (Patient taking differently: Take 50 mg by mouth nightly.)    . doxazosin  (CARDURA ) 8 mg tablet Take 8 mg by mouth nightly.    . EPINEPHrine  (EPIPEN ) 0.3 mg/0.3 mL injection syringe Inject as directed.    . fluticasone  propionate (FLONASE ) 50 mcg/spray nasal spray Administer 1 spray into each nostril Once Daily.    . fluticasone  propionate 0.05 % crea Apply 1 Application topically 2 (two) times a day as needed.    . gabapentin  (NEURONTIN ) 100 mg capsule Take 100 mg by mouth nightly.    . losartan  (COZAAR ) 100 mg tablet Take 100 mg by mouth Once Daily.    . montelukast  (SINGULAIR ) 10 mg tablet Take 10 mg by mouth nightly.    . multivit-min-folic acid-lycopen-lutein (Centrum Silver Men) 300-60-600-300 mcg tab Take 1 tablet by mouth Once Daily.    SABRA olopatadine (PATANOL) 0.1 % ophthalmic solution Administer 1 drop into affected eye(s) daily as needed for allergies. place 1 drop into both eyes    . omeprazole (PriLOSEC) 10 mg DR capsule Take 10 mg by mouth in the morning.    . rosuvastatin  (CRESTOR ) 20 mg tablet Take 20 mg by mouth Once Daily.    . zinc  gluconate 50 mg tab tablet Take 50 mg by mouth Once Daily.    . [DISCONTINUED] aspirin  81 mg EC tablet Take 81 mg by mouth Once Daily.     No current  facility-administered medications on file prior to visit.      Past Medical History:  Diagnosis Date  . Arthritis   . Barrett esophagus   . Chronic serous otitis media   . Chronic sinusitis   . Conductive hearing loss   . Eustachian tube dysfunction   . Hyperlipemia   . Hypertension   . Reflux esophagitis       Past Surgical History:  Procedure Laterality Date  . APPENDECTOMY     Procedure: APPENDECTOMY  . CHOLECYSTECTOMY     Procedure: CHOLECYSTECTOMY  . COLONOSCOPY     Procedure: COLONOSCOPY  . EYE SURGERY     Procedure: EYE SURGERY  . FOOT SURGERY     Procedure: FOOT SURGERY  . KNEE SURGERY     Procedure: KNEE SURGERY  .  MASTOIDECTOMY Right 08/19/2020   Procedure: Right tympanomastoidectomy,  , removal and replacement of right T-tube;  Surgeon: Camellia Layman Milliner, MD;  Location: [facility] OUTPATIENT OR;  Service: ENT;  Laterality: Right;  . MYRINGOTOMY W/ TUBES     Procedure: MYRINGOTOMY W/ TUBES  . NECK SURGERY     Procedure: NECK SURGERY  . SEPTOPLASTY     Procedure: SEPTOPLASTY  . SINUS SURGERY     Procedure: SINUS SURGERY  . TONSILLECTOMY     Procedure: TONSILLECTOMY  . TYMPANOSTOMY TUBE PLACEMENT     Procedure: TYMPANOSTOMY TUBE PLACEMENT     Family History  Problem Relation Name Age of Onset  . Cancer Other    . Allergic rhinitis Other    . Heart disease Other    . Rheum arthritis Other    . Cancer Mother    . Heart attack Father    . Diabetes Maternal Grandmother          Reports that he has never smoked. He has never used smokeless tobacco. He reports that he does not drink alcohol and does not use drugs.    ROS: Reports continued hearing loss in both ears and some intermittent drainage from the right ear.  He also reports visual loss swelling of  his legs.  A complete ROS was performed with pertinent positives/negatives noted in the HPI. The remainder of the ROS are negative.    Prior to this encounter, I have reviewed the patient's current medications and allergies, medical/family/and social histories as well as the patient's vital signs as available.       PHYSICAL EXAMINATION:  Vitals:   04/19/24 1222  BP: 149/66  Pulse: 53  Temp: 97.4 F (36.3 C)  SpO2: 97%     General: Well-developed, well-nourished.  No recognizable syndromes or patterns of malformation.  No acute distress.  Awake, alert, coherent, spontaneous and logical.  Speech: Speech is within normal limits.  Communicates without difficulty.  Head: Normocephalic without lesions or trauma  Face: Facial function is intact and symmetric.  Eyes: PERRLA with EOMI. Hordeolum left lower medial eyelid has completely resolved.    He does not report any change in his vision.  Ears: Otomicroscopic examination performed with the use of the microscope  Right Auricle: Normal location, size, shape, and angulation and No skin lesions or signs of inflammation or infection  Left Auricle:       Normal location, size, shape, and angulation and No skin lesions or signs of inflammation or infection  Right Mastoid Cavity:   Left Mastoid Cavity:   R External Auditory Canal: Normal canal width and length., No signs of infection., and No stenosis.  A moderate amount  of cerumen was cleared.  L External Auditory Canal: Normal canal width and length., No signs of infection., and No stenosis.  A moderate amount of cerumen was cleared.    R Tympanic Membrane: Right tympanic membrane is intact but atrophic.  There is a shallow attic retraction no evidence of cholesteatoma or infection.  No middle ear effusion today.  He had a mobile tympanic membrane on pneumatic otoscopy.  L Tympanic Membrane: The tympanic membrane was clear and mobile., There was an air containing middle ear space., and No evidence of infection.  [place] Testing:   Nose: Normal external nasal examination.  Oral Cavity:   Nasopharynx:   Larynx:   Neck:   Chest:   Cardiac:   Neurologic: Oriented x3 , cranial nerves II-XII were intact except for VIIIth nerve(s)  Other: Slightly dizzy when sitting up from supine position, no nystagmus    Procedure: Debridement of both external auditory canals  With the patient's permission, he was placed in the supine position.  Using the microscope and a cerumen curette, moderate cerumen impactions were removed from both external auditory canals.  His right tympanic membrane is stable without evidence of a middle ear effusion   No signs of infection.  The left tympanic membrane is intact and mobile.  He tolerated the cleaning well and left the clinic in stable condition.      Audiometric Testing:     06-27-19 Audiogram:    My  independent interpretation of the patient's audiogram dated June 27, 2019 is that the patient has bilateral moderately severe to severe steeply sloping mid and high-frequency sensorineural hearing losses with SRT's of 30 DB right ear and 25 DB left  ear with 84% discrimination bilaterally.  Patient would benefit from binaural hearing aid amplification.             02-06-20 Audiogram    My independent interpretation of today's audiogram is that the patient has a moderately severe to profound mixed hearing loss in the right ear with an SRT of 45 DB and 96% discrimination.  He had an SRT of 20 DB in the left ear with 100% discrimination  along with a moderately severe downsloping sensorineural hearing loss in the mid and high frequencies.  He had a type B tympanogram on the right and a type A tympanogram on the left.             10/29/2020 Audiogram:    My independent interpretation of today's audiogram is that the patient has a moderately severe to severe mixed hearing loss in the right ear, mostly in the low and mid frequencies with an SRT of 70 DB and 56% discrimination at 85 DB HTL.  He has an SRT  of 20 DB in the left ear and discrimination was not tested.  He had had 88% word recognition in the left ear on July 21, 2020.              02/12/2021 Audiogram:    My independent interpretation of today's audiogram is that the patient  Has asymmetric hearing loss.  The right ear has a moderately severe to severe mixed hearing loss with an SAT of 60 DB.  The audiologist found 0% word recognition at 85 DB HTL.  This does not correlate with using a speaking tube during which time the patient  was able to hear my voice very well in the right ear.  Patient had previously had 56% word recognition on 10/29/2020.  He was also found to have a moderately severe downsloping sensorineural hearing loss in the left ear with an SRT of 25 DB and 96% discrimination.                04/02/2021  Audiogram:    My independent interpretation of today's audiogram is that the patient has asymmetric hearing loss right ear greater than left ear.  The right ear has a moderately severe to severe mixed hearing loss with an SRT of 60 DB and untestable word recognition.   The left ear has an SRT of 40 DB with 92% discrimination.  His test is essentially unchanged as compared to February 12, 2021 audiogram.              11/12/2021 Audiogram:    My independent interpretation of today's audiogram is that the patient has asymmetric hearing loss.  He has a moderately severe to severe downsloping high-frequency sensorineural hearing loss in the left ear with an SRT of 40 DB and 96% discrimination.   He has a flat mixed hearing loss in the right ear SRT of 55 DB and untestable word recognition.          Diagnostic Studies:    11-26-19 R ear culture Randa)    1+ Methicillin resistant staphylococcus aureusAbnormal                2+ Turicella otitidis               3+ Corynebacterium striatumAbnormal                        Resulting Agency: [facility]    12-26-19 Right Ear Culture:    2+ Methicillin resistant staphylococcus aureusAbnormal                3+ Corynebacterium striatumAbnormal                              Ear culture ID  3+ Corynebacterium striatum Abnormal                              02-12-20 CT scan Temporal Bones:    CONCLUSION:     1.  Thickening of the tympanic membrane with soft tissue density along the surfaces of the middle ear in combination with a large right mastoid effusion could represent chronic otomastoiditis with suspected granulation tissue in the middle ear, however  cholesteatoma is not ruled out. MRI could be performed to further assess for the presence of cholesteatoma.  2.  Small left mastoid effusion.                  04/02/2021 Right ear culture:        Ear culture ID  1+ Staphylococcus epidermidis (CoNS) Abnormal                2+  Turicella otitidis                  12/19/2022 Vestibular Evaluation by Nola Montclair, AUD  REASON FOR VISIT - Latwan Luchsinger is a 89 y.o. male seen today for vestibular assessment due to longstanding disequilibrium and dizziness associated with an upright posture at the request of Dr. Hadassah Parody, MD.   CASE HISTORY- Provided by- patient and medical record   Mr. Mcneill reports disequilibrium  and tunnel vision associated with  an upright posture.  He also reports vague dizziness associated with postural change and with quick head movement.  He is unsure when the symptoms began but reported that they have been present for at least 3 years.  The symptoms seem stable and are neither progressive or improving since initial onset.  He is asymptomatic anytime he is on his feet and ambulating and the symptoms quickly resolve when sitting or lying down.  He has an astigmatism but otherwise has good distance vision per his report.  He reports previous cardiology workup was unrevealing for a cause of his symptoms.  He denies prior treatment with IV antibiotics or any antineoplastic agents.   Mr. Laurie has a longstanding history of bilateral eustachian tube dysfunction and chronic mastoiditis. He underwent a right canal wall up tympanomastoidectomy to treat the mastoiditis in March of 2022. Mr. 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  His last audiogram was completed on 11/12/2021 at which time he was found to have a moderately severe to profound mixed hearing loss in the right ear and a mild sloping to profound high-frequency sensorineural hearing loss in the left ear.  He is followed regularly by otolaryngology.  He was last seen by Dr. Camellia Milliner on 12/16/2022.    SUMMARY This evaluation reveals a partial, bilateral, peripheral vestibular hypofunction.  The patient does appear to have some residual labyrinthine function.  There  is no evidence of a central vestibular abnormality.  This bilateral vestibular hypofunction is likely at least partially contributing to his imbalance and dizziness associated with head movement and position change.   RECOMMENDATIONS  1.The patient as counseled on the pathophysiology and sensory substitution process due to bilateral peripheral vestibular hypofunction. They were advised that they will likely be reliant on their senses of vision and proprioception due to this bilateral vestibular hypofunction and that they should maximize these other sensory inputs when possible to maximize their balance and to reduce fall risk.  The patient may also benefit from working with a physical therapist experienced in vestibular rehabilitation.   06/30/2023 left lower eyelid culture: (L lower lid hordeolum)  No growth after 3 days    Impression:    89 y.o.  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), who is here today for an 3-year 68-months postoperative follow-up visit after undergoing a right canal wall up tympanomastoidectomy for chronic right mastoiditis.  He had had a MRSA  infection in the past and since that time has been growing corynebacteria that has some resistance.  He had failed topical drops and oral antibiotics.  During 1 visit, he was started on custom made vancomycin  1% otic drops 3 drops 3 times a day for 10 days.    On physical examination, his right tympanic membrane was intact and mobile without evidence of any granulation tissue or perforation.  He has responded to the medication very well.  He did not have a middle ear effusion today.  He did have a shallow attic retraction pocket needs to be watched.  I did not recommend a tube, right TM, today as he has no word recognition ability and there was no sign of any fluid. His left tympanic membrane was intact and stable.  Bilateral cerumen impactions were removed.    On previous audiometric testing, the patient has a  moderately severe to severe high-frequency sensorineural hearing loss in the left ear with an SRT of 40 DB with 96%  word recognition.  He has a moderate sensorineural hearing loss right ear but no testable  word recognition.  This is the cause of his chronic tinnitus in the right ear.     He has been fit with BICROS hearing aids at the [facility].    He is neurologically stable other than his gait (walks with a cane) and is complaining of some orthostatic symptoms. He remains constantly dizzy (? Cerebral vascular in nature).  In the past, I recommended that he contact his cardiologist as he may need to have his amlodipine , losartan , and or Cardura  doses adjusted as his heart rate was 50 today.  He has been having difficulty with bradycardia.  He recently fell and had some minor trauma to his left elbow and right knee.  I have recommended a vestibular evaluation previously.  If his vestibular system is functioning, I would refer him to neurology.       Plan:    --Observation  --He was not recommended for a myringotomy and transtympanic tube, right ear, today as he had no middle ear effusion present  --He is obtaining BICROS hearing aids from the [place]  --His cardiologist is following his medications.  He may need to have the amlodipine , losartan , and/or Cardura  doses adjusted as he is experiencing some orthostatic hypotension and bradycardia.  Heart rate was 53 today.   --Will recommend vestibular physical therapy if needed once his cardiac status has been addressed.  --Return to see me in 34-months for follow-up.       1. Mixed conductive and sensorineural hearing loss of right ear with restricted hearing of left ear      2. Sensorineural hearing loss (SNHL) of left ear with restricted hearing of right ear      3. Vestibular hypofunction of both ears      4. Asymmetrical hearing loss      5. Eustachian tube dysfunction, bilateral      6. History of right mastoidectomy          Documentation of time spent today with the patient and any additional time spent today on the patient's behalf:  Category Time (minutes)  Time in room:   Time out of room:   Total time with patient:   Additional time (audio, scans, tests, etc.):   Total time spent today:      Voice-to-Text Disclaimer:   The contents of this note were partially generated with the assistance of the voice-to-text dictation system, Dragon Medical One. I attest to the above information and documentation. However, this note has been created using voice recognition software and may have errors that were not dictated and were not seen during editing.  Camellia EMERSON Milliner, M.D., M.S., F.A.C.S. Assistant Professor, Otology/Neurotology Department of Otolaryngology-Head & Neck Surgery [facility]  [place], [place] [address] [place] Phone: [phone] Email: [email or web address]

## 2024-04-18 NOTE — Telephone Encounter (Signed)
 Schedule the patient a cardioversion for 04/23/2024 at 10am with a 9am arrival.          Dear Isaiah Wilson   You are scheduled for a Cardioversion on Tuesday, November 25 with Dr. Raford.  Please arrive at the [facility] (Main Entrance A) at [facility]: [address] [place], [place] [address] at 8:45am (This time is 1.25 hour(s) before your procedure to ensure your preparation).   Free valet parking service is available. You will check in at ADMITTING.   *Please Note: You will receive a call the day before your procedure to confirm the appointment time. That time may have changed from the original time based on the schedule for that day.*    DIET:  Nothing to eat or drink after midnight except a sip of water  with medications (see medication instructions below)  MEDICATION INSTRUCTIONS: !!IF ANY NEW MEDICATIONS ARE STARTED AFTER TODAY, PLEASE NOTIFY YOUR PROVIDER AS SOON AS POSSIBLE!!  FYI: Medications such as Semaglutide (Ozempic, Wegovy), Tirzepatide (Mounjaro, Zepbound), Dulaglutide (Trulicity), etc (GLP1 agonists) AND Canagliflozin (Invokana), Dapagliflozin (Farxiga), Empagliflozin (Jardiance), Ertugliflozin (Steglatro), Bexagliflozin [employer]) or any combination with one of these drugs such as Invokamet (Canagliflozin/Metformin), Synjardy (Empagliflozin/Metformin), etc (SGLT2 inhibitors) must be held around the time of a procedure. This is not a comprehensive list of all of these drugs. Please review all of your medications and talk to your provider if you take any one of these. If you are not sure, ask your provider.   Continue taking your anticoagulant (blood thinner): Apixaban  (Eliquis ).  You will need to continue this after your procedure until you are told by your provider that it is safe to stop.    LABS:   Come to the lab at the [facility] ([address], [place], 1st Floor) between the hours of 8:00 am and  4:30 pm. You do NOT have to be fasting.   Your support person will be asked to wait in the waiting room during your procedure.  It is OK to have someone drop you off and come back when you are ready to be discharged.  You cannot drive after the procedure and will need someone to drive you home.  Bring your insurance cards.  *Special Note: Every effort is made to have your procedure done on time. Occasionally there are emergencies that occur at the hospital that may cause delays. Please be patient if a delay does occur.

## 2024-04-18 NOTE — Telephone Encounter (Signed)
 Contacted the patient to answer questions. Left message for him to contact the office.

## 2024-04-18 NOTE — Telephone Encounter (Signed)
 Patient called back and states he would like to proceed with the DCCV. He requested to not have the procedure performed on a Thursday and if he could have the test completed in the afternoon. I informed him that most procedures are performed in the am. He voiced understanding and states he will arrange care fpor his disabled wife. He states he does not need his follow up appointment after his DCCV. I tried to explain to the patient that the follow up appt is needed to determine the next steps in his health care. He is going to thing about it and let me know. He states he is running off to an appointment and its ok to leave procedure results on his home phone voicemail.

## 2024-04-19 DIAGNOSIS — H818X3 Other disorders of vestibular function, bilateral: Secondary | ICD-10-CM | POA: Diagnosis not present

## 2024-04-19 DIAGNOSIS — H90A31 Mixed conductive and sensorineural hearing loss, unilateral, right ear with restricted hearing on the contralateral side: Secondary | ICD-10-CM | POA: Diagnosis not present

## 2024-04-19 DIAGNOSIS — H832X3 Labyrinthine dysfunction, bilateral: Secondary | ICD-10-CM | POA: Diagnosis not present

## 2024-04-19 DIAGNOSIS — H6993 Unspecified Eustachian tube disorder, bilateral: Secondary | ICD-10-CM | POA: Diagnosis not present

## 2024-04-19 DIAGNOSIS — H6123 Impacted cerumen, bilateral: Secondary | ICD-10-CM | POA: Diagnosis not present

## 2024-04-19 DIAGNOSIS — Z9089 Acquired absence of other organs: Secondary | ICD-10-CM | POA: Diagnosis not present

## 2024-04-19 DIAGNOSIS — H918X3 Other specified hearing loss, bilateral: Secondary | ICD-10-CM | POA: Diagnosis not present

## 2024-04-19 DIAGNOSIS — H90A22 Sensorineural hearing loss, unilateral, left ear, with restricted hearing on the contralateral side: Secondary | ICD-10-CM | POA: Diagnosis not present

## 2024-04-22 ENCOUNTER — Other Ambulatory Visit: Payer: Self-pay

## 2024-04-22 DIAGNOSIS — Z01818 Encounter for other preprocedural examination: Secondary | ICD-10-CM

## 2024-04-22 DIAGNOSIS — I4891 Unspecified atrial fibrillation: Secondary | ICD-10-CM

## 2024-04-22 NOTE — Telephone Encounter (Signed)
 Reviewed cardioversion instructions with patient. Patient verbalized understanding and expressed appreciation for call.

## 2024-04-22 NOTE — Telephone Encounter (Signed)
 Patient called to follow-up on instructions for upcoming procedures.

## 2024-04-23 ENCOUNTER — Ambulatory Visit (HOSPITAL_COMMUNITY)

## 2024-04-23 ENCOUNTER — Encounter (HOSPITAL_COMMUNITY): Payer: Self-pay | Admitting: Cardiovascular Disease

## 2024-04-23 ENCOUNTER — Ambulatory Visit (HOSPITAL_COMMUNITY)
Admission: RE | Admit: 2024-04-23 | Discharge: 2024-04-23 | Disposition: A | Discharge status: Home / Self Care | Attending: Cardiovascular Disease | Admitting: Cardiovascular Disease

## 2024-04-23 ENCOUNTER — Other Ambulatory Visit: Payer: Self-pay

## 2024-04-23 ENCOUNTER — Ambulatory Visit (HOSPITAL_COMMUNITY): Admitting: Anesthesiology

## 2024-04-23 ENCOUNTER — Encounter (HOSPITAL_COMMUNITY)
Admission: RE | Disposition: A | Discharge status: Home / Self Care | Payer: Self-pay | Source: Home / Self Care | Attending: Cardiovascular Disease

## 2024-04-23 DIAGNOSIS — Z7982 Long term (current) use of aspirin: Secondary | ICD-10-CM | POA: Insufficient documentation

## 2024-04-23 DIAGNOSIS — E785 Hyperlipidemia, unspecified: Secondary | ICD-10-CM | POA: Diagnosis not present

## 2024-04-23 DIAGNOSIS — I4891 Unspecified atrial fibrillation: Secondary | ICD-10-CM

## 2024-04-23 DIAGNOSIS — I10 Essential (primary) hypertension: Secondary | ICD-10-CM

## 2024-04-23 DIAGNOSIS — G4733 Obstructive sleep apnea (adult) (pediatric): Secondary | ICD-10-CM | POA: Insufficient documentation

## 2024-04-23 DIAGNOSIS — Z79899 Other long term (current) drug therapy: Secondary | ICD-10-CM | POA: Diagnosis not present

## 2024-04-23 DIAGNOSIS — I712 Thoracic aortic aneurysm, without rupture, unspecified: Secondary | ICD-10-CM | POA: Diagnosis not present

## 2024-04-23 DIAGNOSIS — I2510 Atherosclerotic heart disease of native coronary artery without angina pectoris: Secondary | ICD-10-CM | POA: Insufficient documentation

## 2024-04-23 DIAGNOSIS — Z7901 Long term (current) use of anticoagulants: Secondary | ICD-10-CM | POA: Insufficient documentation

## 2024-04-23 DIAGNOSIS — F419 Anxiety disorder, unspecified: Secondary | ICD-10-CM

## 2024-04-23 DIAGNOSIS — Z006 Encounter for examination for normal comparison and control in clinical research program: Secondary | ICD-10-CM

## 2024-04-23 HISTORY — PX: CARDIOVERSION: EP1203

## 2024-04-23 HISTORY — PX: TRANSESOPHAGEAL ECHOCARDIOGRAM (CATH LAB): EP1270

## 2024-04-23 LAB — POCT I-STAT, CHEM 8
BUN: 20 mg/dL (ref 8–23)
Calcium, Ion: 1.23 mmol/L (ref 1.15–1.40)
Chloride: 107 mmol/L (ref 98–111)
Creatinine, Ser: 1.3 mg/dL — ABNORMAL HIGH (ref 0.61–1.24)
Glucose, Bld: 116 mg/dL — ABNORMAL HIGH (ref 70–99)
HCT: 40 % (ref 39.0–52.0)
Hemoglobin: 13.6 g/dL (ref 13.0–17.0)
Potassium: 4.2 mmol/L (ref 3.5–5.1)
Sodium: 143 mmol/L (ref 135–145)
TCO2: 26 mmol/L (ref 22–32)

## 2024-04-23 LAB — ECHO TEE

## 2024-04-23 SURGERY — CARDIOVERSION (CATH LAB)
Anesthesia: General

## 2024-04-23 MED ORDER — EPHEDRINE SULFATE-NACL 50-0.9 MG/10ML-% IV SOSY
PREFILLED_SYRINGE | INTRAVENOUS | Status: DC | PRN
  Administered 2024-04-23: 7.5 mg via INTRAVENOUS

## 2024-04-23 MED ORDER — SODIUM CHLORIDE 0.9 % IV SOLN: INTRAVENOUS | Status: DC

## 2024-04-23 MED ORDER — PROPOFOL 1000 MG/100ML IV EMUL
INTRAVENOUS | Status: AC
  Filled 2024-04-23: qty 100

## 2024-04-23 MED ORDER — PROPOFOL 10 MG/ML IV BOLUS
INTRAVENOUS | Status: DC | PRN
  Administered 2024-04-23: 200 ug/kg/min via INTRAVENOUS
  Administered 2024-04-23: 40 mg via INTRAVENOUS

## 2024-04-23 SURGICAL SUPPLY — 1 items: PAD DEFIB RADIO PHYSIO CONN (PAD) ×2 IMPLANT

## 2024-04-23 NOTE — Discharge Instructions (Signed)
Electrical Cardioversion Electrical cardioversion is the delivery of a jolt of electricity to restore a normal rhythm to the heart. A rhythm that is too fast or is not regular keeps the heart from pumping well. In this procedure, sticky patches or metal paddles are placed on the chest to deliver electricity to the heart from a device. This procedure may be done in an emergency if: There is low or no blood pressure as a result of the heart rhythm. Normal rhythm must be restored as fast as possible to protect the brain and heart from further damage. It may save a life. This may also be a scheduled procedure for irregular or fast heart rhythms that are not immediately life-threatening.  What can I expect after the procedure? Your blood pressure, heart rate, breathing rate, and blood oxygen level will be monitored until you leave the hospital or clinic. Your heart rhythm will be watched to make sure it does not change. You may have some redness on the skin where the shocks were given. Over the counter cortizone cream may be helpful.  Follow these instructions at home: Do not drive for 24 hours if you were given a sedative during your procedure. Take over-the-counter and prescription medicines only as told by your health care provider. Ask your health care provider how to check your pulse. Check it often. Rest for 48 hours after the procedure or as told by your health care provider. Avoid or limit your caffeine use as told by your health care provider. Keep all follow-up visits as told by your health care provider. This is important. Contact a health care provider if: You feel like your heart is beating too quickly or your pulse is not regular. You have a serious muscle cramp that does not go away. Get help right away if: You have discomfort in your chest. You are dizzy or you feel faint. You have trouble breathing or you are short of breath. Your speech is slurred. You have trouble moving an  arm or leg on one side of your body. Your fingers or toes turn cold or blue. Summary Electrical cardioversion is the delivery of a jolt of electricity to restore a normal rhythm to the heart. This procedure may be done right away in an emergency or may be a scheduled procedure if the condition is not an emergency. Generally, this is a safe procedure. After the procedure, check your pulse often as told by your health care provider. This information is not intended to replace advice given to you by your health care provider. Make sure you discuss any questions you have with your health care provider. Document Revised: 12/17/2018 Document Reviewed: 12/17/2018 Elsevier Patient Education  2020 Elsevier Inc. TEE  YOU HAD AN CARDIAC PROCEDURE TODAY: Refer to the procedure report and other information in the discharge instructions given to you for any specific questions about what was found during the examination. If this information does not answer your questions, please call [facility] office at [phone] to clarify.   DIET: Your first meal following the procedure should be a light meal and then it is ok to progress to your normal diet. A half-sandwich or bowl of soup is an example of a good first meal. Heavy or fried foods are harder to digest and may make you feel nauseous or bloated. Drink plenty of fluids but you should avoid alcoholic beverages for 24 hours. If you had a esophageal dilation, please see attached instructions for diet.   ACTIVITY: Your   care partner should take you home directly after the procedure. You should plan to take it easy, moving slowly for the rest of the day. You can resume normal activity the day after the procedure however YOU SHOULD NOT DRIVE, use power tools, machinery or perform tasks that involve climbing or major physical exertion for 24 hours (because of the sedation medicines used during the test).   SYMPTOMS TO REPORT IMMEDIATELY: A cardiologist can be  reached at any hour. Please call [phone] for any of the following symptoms:  Vomiting of blood or coffee ground material  New, significant abdominal pain  New, significant chest pain or pain under the shoulder blades  Painful or persistently difficult swallowing  New shortness of breath  Black, tarry-looking or red, bloody stools  FOLLOW UP:  Please also call with any specific questions about appointments or follow up tests.  

## 2024-04-23 NOTE — Anesthesia Preprocedure Evaluation (Addendum)
 Anesthesia Evaluation  Patient identified by MRN, date of birth, ID band Patient awake    Reviewed: Allergy & Precautions, NPO status , Patient's Chart, lab work & pertinent test results  Airway Mallampati: II  TM Distance: >3 FB Neck ROM: Full    Dental no notable dental hx.    Pulmonary neg pulmonary ROS   Pulmonary exam normal        Cardiovascular hypertension, + dysrhythmias Atrial Fibrillation  Rhythm:Irregular Rate:Normal  Echocardiogram 10/07/2019 IMPRESSIONS   1. Left ventricular ejection fraction, by estimation, is 60 to 65%. The  left ventricle has normal function. The left ventricle has no regional  wall motion abnormalities. There is severe concentric left ventricular  hypertrophy. Left ventricular diastolic   parameters are consistent with Grade II diastolic dysfunction  (pseudonormalization). Elevated left ventricular end-diastolic pressure.   2. Right ventricular systolic function is normal. The right ventricular  size is normal. There is normal pulmonary artery systolic pressure. The  estimated right ventricular systolic pressure is 19.0 mmHg.   3. Left atrial size was mildly dilated.   4. The mitral valve is normal in structure. Trivial mitral valve  regurgitation. No evidence of mitral stenosis.   5. The aortic valve is tricuspid. Aortic valve regurgitation is mild.  Mild aortic valve sclerosis is present, with no evidence of aortic valve  stenosis.   6. Aortic dilatation noted. 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   7. The inferior vena cava is normal in size with greater than 50%  respiratory variability, suggesting right atrial pressure of 3 mmHg.     Neuro/Psych   Anxiety     negative neurological ROS     GI/Hepatic Neg liver ROS, hiatal hernia,GERD  ,,  Endo/Other  negative endocrine ROS    Renal/GU negative Renal ROS   negative genitourinary   Musculoskeletal  (+) Arthritis ,    Abdominal Normal abdominal exam  (+)   Peds  Hematology negative hematology ROS (+)   Anesthesia Other Findings   Reproductive/Obstetrics                              Anesthesia Physical Anesthesia Plan  ASA: 3  Anesthesia Plan: MAC   Post-op Pain Management:    Induction: Intravenous  PONV Risk Score and Plan: 2 and Treatment may vary due to age or medical condition and Propofol  infusion  Airway Management Planned: Simple Face Mask and Nasal Cannula  Additional Equipment: None  Intra-op Plan:   Post-operative Plan:   Informed Consent: I have reviewed the patients History and Physical, chart, labs and discussed the procedure including the risks, benefits and alternatives for the proposed anesthesia with the patient or authorized representative who has indicated his/her understanding and acceptance.     Dental advisory given  Plan Discussed with: CRNA  Anesthesia Plan Comments:          Anesthesia Quick Evaluation

## 2024-04-23 NOTE — CV Procedure (Signed)
 Brief TEE Note  LVEF 60-65% Trivial MR and TR. No LA/LAA thrombus or masses.   For additional details see full report.    Electrical Cardioversion Procedure Note Isaiah Wilson [id number] 1934-08-24  Procedure: Electrical Cardioversion Indications:  Atrial Fibrillation  Procedure Details Consent: Risks of procedure as well as the alternatives and risks of each were explained to the (patient/caregiver).  Consent for procedure obtained. Time Out: Verified patient identification, verified procedure, site/side was marked, verified correct patient position, special equipment/implants available, medications/allergies/relevent history reviewed, required imaging and test results available.  Performed  Patient placed on cardiac monitor, pulse oximetry, supplemental oxygen as necessary.  Sedation given: propofol  Pacer pads placed anterior and posterior chest.  Cardioverted 1 time(s).  Cardioverted at 200J.  Evaluation Findings: Post procedure EKG shows: NSR Complications: None Patient did tolerate procedure well.   Annabella Scarce, MD 04/23/2024, 10:16 AM

## 2024-04-23 NOTE — Research (Signed)
 [facility] Cardioversion Informed Consent   Subject Name: Isaiah Wilson  Subject met inclusion and exclusion criteria.  The informed consent form, study requirements and expectations were reviewed with the subject and questions and concerns were addressed prior to the signing of the consent form.  The subject verbalized understanding of the trial requirements.  The subject agreed to participate in the [facility] Cardioversion trial and signed the informed consent at 0839 on 25/Nov/2025.  The informed consent was obtained prior to performance of any protocol-specific procedures for the subject.  A copy of the signed informed consent was given to the subject and a copy was placed in the subject's medical record.   [name] [name]

## 2024-04-23 NOTE — Interval H&P Note (Signed)
 History and Physical Interval Note:  04/23/2024 9:32 AM  Isaiah Wilson  has presented today for surgery, with the diagnosis of AFIB.  The various methods of treatment have been discussed with the patient and family. After consideration of risks, benefits and other options for treatment, the patient has consented to  Procedure(s): CARDIOVERSION (N/A) as a surgical intervention.  The patient's history has been reviewed, patient examined, no change in status, stable for surgery.  I have reviewed the patient's chart and labs.  Questions were answered to the patient's satisfaction.    Patient has only been anticoagulated for two weeks.  Therefore, we will add a transesophageal echo prior to the cardioversion.  Patient agrees and consents.    Annabella Scarce, MD

## 2024-04-23 NOTE — Transfer of Care (Signed)
 Immediate Anesthesia Transfer of Care Note  Patient: Isaiah Wilson  Procedure(s) Performed: CARDIOVERSION TRANSESOPHAGEAL ECHOCARDIOGRAM  Patient Location: PACU  Anesthesia Type:MAC  Level of Consciousness: drowsy  Airway & Oxygen Therapy: Patient Spontanous Breathing  Post-op Assessment: Report given to RN  Post vital signs: Reviewed and stable  Last Vitals:  Vitals Value Taken Time  BP 107/60 04/23/24 10:27  Temp 36.7 C 04/23/24 10:20  Pulse 49 04/23/24 10:29  Resp 21 04/23/24 10:29  SpO2 94 % 04/23/24 10:29  Vitals shown include unfiled device data.  Last Pain:  Vitals:   04/23/24 1020  TempSrc: Tympanic  PainSc: Asleep         Complications: No notable events documented.

## 2024-04-23 NOTE — Progress Notes (Signed)
  Echocardiogram Echocardiogram Transesophageal has been performed.  Isaiah Wilson 04/23/2024, 10:28 AM

## 2024-04-25 NOTE — Anesthesia Postprocedure Evaluation (Signed)
 Anesthesia Post Note  Patient: Isaiah Wilson  Procedure(s) Performed: CARDIOVERSION TRANSESOPHAGEAL ECHOCARDIOGRAM     Patient location during evaluation: PACU Anesthesia Type: General Level of consciousness: awake and alert Pain management: pain level controlled Vital Signs Assessment: post-procedure vital signs reviewed and stable Respiratory status: spontaneous breathing, nonlabored ventilation, respiratory function stable and patient connected to nasal cannula oxygen Cardiovascular status: blood pressure returned to baseline and stable Postop Assessment: no apparent nausea or vomiting Anesthetic complications: no   No notable events documented.  Last Vitals:  Vitals:   04/23/24 1040 04/23/24 1050  BP: 101/60 116/62  Pulse: (!) 50 65  Resp: 18 17  Temp:    SpO2: 93% 95%    Last Pain:  Vitals:   04/23/24 1050  TempSrc:   PainSc: 0-No pain                 Cordella P Shatonia Hoots

## 2024-04-27 ENCOUNTER — Ambulatory Visit: Payer: Self-pay | Admitting: Cardiovascular Disease

## 2024-05-01 ENCOUNTER — Ambulatory Visit: Admitting: Podiatry

## 2024-05-01 ENCOUNTER — Encounter: Payer: Self-pay | Admitting: Podiatry

## 2024-05-01 VITALS — Ht 70.0 in | Wt 250.0 lb

## 2024-05-01 DIAGNOSIS — M79674 Pain in right toe(s): Secondary | ICD-10-CM | POA: Diagnosis not present

## 2024-05-01 DIAGNOSIS — B351 Tinea unguium: Secondary | ICD-10-CM | POA: Diagnosis not present

## 2024-05-01 DIAGNOSIS — D689 Coagulation defect, unspecified: Secondary | ICD-10-CM

## 2024-05-01 DIAGNOSIS — M79675 Pain in left toe(s): Secondary | ICD-10-CM | POA: Diagnosis not present

## 2024-05-01 DIAGNOSIS — L84 Corns and callosities: Secondary | ICD-10-CM | POA: Diagnosis not present

## 2024-05-04 NOTE — Progress Notes (Signed)
 Subjective:   Patient ID: Isaiah Wilson, male   DOB: 89 y.o.   MRN: [id number]   HPI Patient presents currently with chronic lesion first metatarsal head bilateral and thickened yellowed brittle nailbeds 1-5 both feet with history of ankle pain it is under reasonable control right now.  Patient is on blood thinner high risk with treatment   ROS      Objective:  Physical Exam  Neurovascular status intact digital perfusion good with thick yellow elongated brittle nailbeds 1-5 both feet that are irritating and keratotic lesion first metatarsal bilateral painful when pressed     Assessment:  Chronic mycotic nail infection with pain 1-5 both feet lesions first metatarsal head bilateral     Plan:  H&P reviewed high risk due to blood thinner debrided lesions carefully first metatarsal no iatrogenic bleeding and debridement nailbeds 1-5 both feet no iatrogenic bleeding noted

## 2024-05-07 ENCOUNTER — Telehealth: Payer: Self-pay | Admitting: Cardiology

## 2024-05-07 DIAGNOSIS — I4891 Unspecified atrial fibrillation: Secondary | ICD-10-CM

## 2024-05-07 MED ORDER — APIXABAN 5 MG PO TABS: 5.0000 mg | ORAL_TABLET | Freq: Two times a day (BID) | ORAL | 1 refills | Status: AC

## 2024-05-07 NOTE — Telephone Encounter (Signed)
 Prescription sent in and pt contacted and advised.

## 2024-05-07 NOTE — Telephone Encounter (Signed)
 Okay with refill

## 2024-05-07 NOTE — Telephone Encounter (Signed)
 Pt c/o medication issue:  1. Name of Medication:   apixaban  (ELIQUIS ) 5 MG TABS tablet   2. How are you currently taking this medication (dosage and times per day)?   Yes.  3. Are you having a reaction (difficulty breathing--STAT)?   4. What is your medication issue?    Patient stated his insurance company advised him to request a 3 month prescription for this medication as this will save him a considerable amount.  Patient stated he currently only has 2 days left of this medication and his pharmacy is [facility] [id number] - [place], [place] - [address]

## 2024-05-13 ENCOUNTER — Other Ambulatory Visit: Payer: Self-pay | Admitting: Cardiology

## 2024-05-14 NOTE — Progress Notes (Unsigned)
 Cardiology Clinic Note   Patient Name: Isaiah Wilson Date of Encounter: 05/14/2024  Primary Care Provider:  Micheal Wolm ORN, MD Primary Cardiologist:  Redell Shallow, MD  Patient Profile    Isaiah Wilson 89 year old male presents the clinic today for follow-up evaluation of his essential hypertension and hyperlipidemia.  Past Medical History    Past Medical History:  Diagnosis Date   Abnormal EKG    hx left anterior fasicular block on old ekg 2017   Allergic rhinitis    Allergy    Barrett's esophagus    Benign prostatic hypertrophy    Colitis, ischemic    X 2   Complication of anesthesia    SLOW TO AWAKEN AFTER 1 SURGERY 20  YRS AGO   DJD (degenerative joint disease)    SPINE, AND OA   GERD (gastroesophageal reflux disease)    H/O hiatal hernia    HOH (hard of hearing)    BOTH EARS   Hyperlipidemia    no medication per pt.   Hypertension    pt denies says cardura  for bladder   Neuropathy     LEFT HAND AND ARM NUMB ALL THE TIME   Overweight(278.02)    Past Surgical History:  Procedure Laterality Date   2 neck surgeries  2005-2006   Dr. Fate cx decompression, diskectomy,fusion C6-7 allograftWITH PLATE   APPENDECTOMY     CARDIOVERSION N/A 04/23/2024   Procedure: CARDIOVERSION;  Surgeon: Raford Riggs, MD;  Location: [facility] INVASIVE CV LAB;  Service: Cardiovascular;  Laterality: N/A;   EYE SURGERY     bilateral cataract surgerywith lens implant   FOOT SURGERY     right x 3   INNER EAR SURGERY Right    07/2020   LAPAROSCOPIC CHOLECYSTECTOMY  1993   Dr. MYRTIS Salt   left total knee replacement  07/2007   Dr. erskine    right total knee replacement  2012   Dr. Hiram REPINE  1975, May 2013   x3   TONSILLECTOMY  as child   TOTAL HIP ARTHROPLASTY Left 04/18/2018   Procedure: LEFT TOTAL HIP ARTHROPLASTY ANTERIOR APPROACH;  Surgeon: Melodi Lerner, MD;  Location: [facility] ORS;  Service: Orthopedics;  Laterality: Left;   TOTAL HIP ARTHROPLASTY  Right 03/13/2019   Procedure: TOTAL HIP ARTHROPLASTY ANTERIOR APPROACH;  Surgeon: Melodi Lerner, MD;  Location: [facility];  Service: Orthopedics;  Laterality: Right;    TOTAL KNEE REVISION Left 11/11/2015   Procedure: LEFT KNEE POLYETHELENE REVISION;  Surgeon: Lerner Melodi, MD;  Location: [facility];  Service: Orthopedics;  Laterality: Left;   TOTAL KNEE REVISION Left 12/07/2016   Procedure: LEFT TOTAL KNEE REVISION;  Surgeon: Melodi Lerner, MD;  Location: [facility];  Service: Orthopedics;  Laterality: Left;  Adductor Block   TRANSESOPHAGEAL ECHOCARDIOGRAM (CATH LAB) N/A 04/23/2024   Procedure: TRANSESOPHAGEAL ECHOCARDIOGRAM;  Surgeon: Raford Riggs, MD;  Location: [facility] INVASIVE CV LAB;  Service: Cardiovascular;  Laterality: N/A;    Allergies  No Known Allergies  History of Present Illness    Isaiah Wilson has a PMH of HLD, HTN, thoracic aortic aneurysm, and coronary artery disease.  He had a brain MRI 3/21 which showed moderate chronic microvascular ischemic changes.  His echocardiogram 5/21 showed normal LV function, severe LVH, G2 DD, left atrial enlargement, mild aortic insufficiency, and dilated ascending aorta measuring 40 felt more millimeters.  He underwent nuclear stress test 5/21 which showed an EF of 62% no ischemia.  He had a CTA 5/22  which showed a stable aortic aneurysm measuring 4.2 cm and atherosclerosis including his coronary arteries and aorta.  He had repeat CT angio chest 10/08/2021 which showed stable ascending aortic aneurysm measuring 4.2 cm.  CT angio chest aorta 03/20/2023 showed ascending aorta measuring 4.4 cm.  He was seen in follow-up by Dr. Pietro on 12/09/2020.  During that time reported no dyspnea or chest pain.  He did note dizziness with standing which should improve with sitting.  He felt his dizziness was related to his ear.  He presented to the clinic 12/06/21 for follow-up evaluation and stated he felt tired d/t caring for his spouse.  He reported that he  was her primary caregiver and had done so for the last 3 years.  He was somewhat limited in his physical activity due to back pain.  We reviewed his most recent CT and he expressed understanding.  His EKG showed sinus bradycardia with first-degree AV block 54 bpm.  We planned repeat CT angio chest in 1 year, contact Isabel to reach out about respite care, and planned follow-up in 1 year.  He presented to the clinic 03/03/23 for follow-up evaluation and stated he continued to be the primary caregiver for his wife.  He had noticed some lower extremity swelling.  He had not been able to wear lower extremity support stockings.  We reviewed the importance of low-sodium diet.  We reviewed his thoracic aortic aneurysm.  His blood pressure had been well-controlled.  I order a repeat CT angio chest aorta for May and repeat echocardiogram.  Follow-up in 12 months was planned.   He presents to the clinic today for follow-up evaluation and states he has had increased stress recently related to his church.  He reports that his church is dividing.  He continues to care for his wife.  He notes that he occasionally has chest discomfort with increased physical activity.  He also reports some increased fatigue recently.  He attributes this to not being able to sleep well.  He notes that he previously was told that he may have sleep apnea.  He does not want to wear CPAP or wear oral appliance.  We reviewed his previous CT angio chest and aneurysm.  He does not wish to proceed with further diagnostic testing.  He notes that he would not want to undergo surgery for this.  His EKG today shows atrial fibrillation.  We reviewed CVA risk.  He expressed understanding.  I will start him on apixaban  5 mg twice daily.  I will have him come back to the office in 3 to 4 weeks to discuss possibility of DCCV.  I encouraged him to not miss any doses of his blood thinner.  Today he denies chest pain, shortness of breath, lower extremity edema,  fatigue, palpitations, melena, hematuria, hemoptysis, diaphoresis, weakness, presyncope, syncope, orthopnea, and PND.   Home Medications    Prior to Admission medications   Medication Sig Start Date End Date Taking? Authorizing Provider  acetaminophen  (TYLENOL ) 650 MG CR tablet Take 650 mg by mouth every 8 (eight) hours as needed for pain.    [provider]  amLODipine  (NORVASC ) 5 MG tablet TAKE ONE TABLET BY MOUTH DAILY 01/08/21   Pietro Redell RAMAN, MD  aspirin  EC 81 MG tablet Take 81 mg by mouth daily.    [provider]  azelastine  (ASTELIN ) 0.1 % nasal spray Place 1 spray into both nostrils 2 (two) times daily as needed (allergies.).     [provider]  clobetasol (TEMOVATE) 0.05 % external solution     [provider]  doxazosin  (CARDURA ) 8 MG tablet Take 1 tablet (8 mg total) by mouth at bedtime. 11/24/21   Burchette, Wolm ORN, MD  EPINEPHrine  0.3 mg/0.3 mL IJ SOAJ injection Inject 0.3 mg into the muscle as needed (allergic reaction).     [provider]  esomeprazole  (NEXIUM ) 40 MG capsule TAKE ONE CAPSULE BY MOUTH DAILY (TAKE ON AN EMPTY STOMACH 30 MINUTES PRIOR TO A MEAL) FOR BARRETT'S ESOPHAGUS 06/17/21   Abran Norleen SAILOR, MD  fluticasone  (FLONASE ) 50 MCG/ACT nasal spray Place 2 sprays into both nostrils daily.     [provider]  loratadine  (CLARITIN ) 10 MG tablet     [provider]  losartan  (COZAAR ) 100 MG tablet TAKE ONE TABLET BY MOUTH DAILY 08/31/21   Burchette, Wolm ORN, MD  montelukast  (SINGULAIR ) 10 MG tablet TAKE ONE TABLET BY MOUTH EVERY NIGHT AT BEDTIME 10/11/21   Burchette, Wolm ORN, MD  mupirocin ointment (BACTROBAN) 2 %  12/26/19   [provider]  rosuvastatin  (CRESTOR ) 40 MG tablet TAKE ONE-HALF TABLET BY MOUTH ONCE A DAY 02/20/21   [provider]  tobramycin-dexamethasone  ANTHONEY) ophthalmic solution Apply to eye. 12/26/19   [provider]  vancomycin  (VANCOCIN ) 1 g injection      [provider]  zinc  gluconate 50 MG tablet Take 50 mg by mouth daily.    [provider]    Family History    Family History  Problem Relation Age of Onset   Leukemia Mother    Heart disease Father    Heart disease Sister    Colon cancer Neg Hx    Stomach cancer Neg Hx    Rectal cancer Neg Hx    Esophageal cancer Neg Hx    He indicated that his mother is deceased. He indicated that his father is deceased. He indicated that his sister is alive. He indicated that the status of his neg hx is unknown. He indicated that the status of his other is unknown and reported the following: sibling alive.  Social History    Social History   Socioeconomic History   Marital status: Married    Spouse name: Not on file   Number of children: 2   Years of education: some college   Highest education level: Not on file  Occupational History   Occupation: retired  Tobacco Use   Smoking status: Never   Smokeless tobacco: Never  Vaping Use   Vaping status: Never Used  Substance and Sexual Activity   Alcohol use: No    Alcohol/week: 0.0 standard drinks of alcohol   Drug use: No   Sexual activity: Not on file  Other Topics Concern   Not on file  Social History Narrative   Married   HH 2   2 children; 3 granchildren; 2 great grandchildren   Social Drivers of Health   Tobacco Use: Low Risk (05/01/2024)   Patient History    Smoking Tobacco Use: Never    Smokeless Tobacco Use: Never    Passive Exposure: Not on file  Financial Resource Strain: Low Risk (10/11/2022)   Overall Financial Resource Strain (CARDIA)    Difficulty of Paying Living Expenses: Not hard at all  Food Insecurity: Low Risk (04/19/2024)   Received from [facility]   Epic    Within the past 12 months, you worried that your food would run out before you got money to buy more: Never  true    Within the past 12 months, the food you bought just didn't last and you didn't have money to get more. : Never  true  Transportation Needs: No Transportation Needs (04/19/2024)   Received from [facility]    In the past 12 months, has lack of reliable transportation kept you from medical appointments, meetings, work or from getting things needed for daily living? : No  Physical Activity: Inactive (05/03/2021)   Exercise Vital Sign    Days of Exercise per Week: 0 days    Minutes of Exercise per Session: 0 min  Stress: Stress Concern Present (05/03/2021)   [employer] - Occupational Stress Questionnaire    Feeling of Stress : To some extent  Social Connections: Not on file  Intimate Partner Violence: Not on file  Depression [phone]): Low Risk (12/12/2023)   Depression (PHQ2-9)    PHQ-2 Score: 0  Alcohol Screen: Not on file  Housing: Low Risk (04/19/2024)   Received from [facility]   Epic    What is your living situation today?: I have a steady place to live    Think about the place you live. Do you have problems with any of the following? Choose all that apply:: None/None on this list  Utilities: Low Risk (04/19/2024)   Received from [facility]   Utilities    In the past 12 months has the electric, gas, oil, or water  company threatened to shut off services in your home? : No  Health Literacy: Not on file     Review of Systems    General:  No chills, fever, night sweats or weight changes.  Cardiovascular:  No chest pain, dyspnea on exertion, edema, orthopnea, palpitations, paroxysmal nocturnal dyspnea. Dermatological: No rash, lesions/masses Respiratory: No cough, dyspnea Urologic: No hematuria, dysuria Abdominal:   No nausea, vomiting, diarrhea, bright red blood per rectum, melena, or hematemesis Neurologic:  No visual changes, wkns, changes in mental status. All other systems reviewed and are otherwise negative except as noted above.  Physical Exam    VS:  There were no vitals taken for this visit. , BMI There is no height or weight  on file to calculate BMI. GEN: Well nourished, well developed, in no acute distress. HEENT: normal. Neck: Supple, no JVD, carotid bruits, or masses. Cardiac: Irregularly irregular, no murmurs, rubs, or gallops. No clubbing, cyanosis, generalized bilateral nonpitting lower extremity edema.  Radials/DP/PT 2+ and equal bilaterally.  Respiratory:  Respirations regular and unlabored, clear to auscultation bilaterally. GI: Soft, nontender, nondistended, BS + x 4. MS: no deformity or atrophy. Skin: warm and dry, no rash. Neuro:  Strength and sensation are intact. Psych: Normal affect.  Accessory Clinical Findings    Recent Labs: 10/16/2023: ALT 20 04/23/2024: BUN 20; Creatinine, Ser 1.30; Hemoglobin 13.6; Potassium 4.2; Sodium 143   Recent Lipid Panel    Component Value Date/Time   CHOL 108 10/16/2023 1506   CHOL 118 02/10/2021 1356   TRIG 245.0 (H) 10/16/2023 1506   HDL 35.10 (L) 10/16/2023 1506   HDL 39 (L) 02/10/2021 1356   CHOLHDL 3 10/16/2023 1506   VLDL 49.0 (H) 10/16/2023 1506   LDLCALC 24 10/16/2023 1506   LDLCALC 52 02/10/2021 1356   LDLDIRECT 101.0 11/16/2016 0737    ECG personally reviewed by me today-EKG Interpretation Date/Time:    Ventricular Rate:    PR Interval:    QRS Duration:    QT Interval:    QTC Calculation:  R Axis:      Text Interpretation:       EKG 12/06/2021  sinus bradycardia with first-degree AV block right superior axis deviation inferior infarct undetermined age is 54 bpm- No acute changes  Echocardiogram 10/07/2019 IMPRESSIONS     1. Left ventricular ejection fraction, by estimation, is 60 to 65%. The  left ventricle has normal function. The left ventricle has no regional  wall motion abnormalities. There is severe concentric left ventricular  hypertrophy. Left ventricular diastolic   parameters are consistent with Grade II diastolic dysfunction  (pseudonormalization). Elevated left ventricular end-diastolic pressure.   2. Right  ventricular systolic function is normal. The right ventricular  size is normal. There is normal pulmonary artery systolic pressure. The  estimated right ventricular systolic pressure is 19.0 mmHg.   3. Left atrial size was mildly dilated.   4. The mitral valve is normal in structure. Trivial mitral valve  regurgitation. No evidence of mitral stenosis.   5. The aortic valve is tricuspid. Aortic valve regurgitation is mild.  Mild aortic valve sclerosis is present, with no evidence of aortic valve  stenosis.   6. Aortic dilatation noted. 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   7. The inferior vena cava is normal in size with greater than 50%  respiratory variability, suggesting right atrial pressure of 3 mmHg.   FINDINGS   Left Ventricle: Left ventricular ejection fraction, by estimation, is 60  to 65%. The left ventricle has normal function. The left ventricle has no  regional wall motion abnormalities. The left ventricular internal cavity  size was normal in size. There is   severe concentric left ventricular hypertrophy. Left ventricular  diastolic parameters are consistent with Grade II diastolic dysfunction  (pseudonormalization). Elevated left ventricular end-diastolic pressure.   Right Ventricle: The right ventricular size is normal. No increase in  right ventricular wall thickness. Right ventricular systolic function is  normal. There is normal pulmonary artery systolic pressure. The tricuspid  regurgitant velocity is 2.00 m/s, and   with an assumed right atrial pressure of 3 mmHg, the estimated right  ventricular systolic pressure is 19.0 mmHg.   Left Atrium: Left atrial size was mildly dilated.   Right Atrium: Right atrial size was normal in size.   Pericardium: Trivial pericardial effusion is present. The pericardial  effusion is circumferential.   Mitral Valve: The mitral valve is normal in  structure. Normal mobility of  the mitral valve leaflets. Trivial mitral valve regurgitation. No evidence  of mitral valve stenosis.   Tricuspid Valve: The tricuspid valve is normal in structure. Tricuspid  valve regurgitation is trivial. No evidence of tricuspid stenosis.   Aortic Valve: The aortic valve is tricuspid. Aortic valve regurgitation is  mild. Aortic regurgitation PHT measures 690 msec. Mild aortic valve  sclerosis is present, with no evidence of aortic valve stenosis.   Pulmonic Valve: The pulmonic valve was normal in structure. Pulmonic valve  regurgitation is trivial. No evidence of pulmonic stenosis.   Aorta: Aortic dilatation noted. There is mild dilatation of the ascending  aorta and of the aortic root measuring 44 mm.   Venous: The inferior vena cava is normal in size with greater than 50%  respiratory variability, suggesting right atrial pressure of 3 mmHg.   IAS/Shunts: No atrial level shunt detected by color flow Doppler.  CT angio chest aorta 10/08/2021  COMPARISON:  Oct 13, 2020.   FINDINGS: Cardiovascular:  Grossly stable 4.2 cm ascending thoracic aortic aneurysm is noted. No dissection is noted. Great vessels are widely patent without significant stenosis. Stable cardiomegaly. No pericardial effusion. Atherosclerosis of thoracic aorta is noted. Mild coronary calcifications are noted.   Mediastinum/Nodes: No enlarged mediastinal, hilar, or axillary lymph nodes. Thyroid  gland, trachea, and esophagus demonstrate no significant findings.   Lungs/Pleura: Lungs are clear. No pleural effusion or pneumothorax.   Upper Abdomen: No acute abnormality.   Musculoskeletal: No chest wall abnormality. No acute or significant osseous findings.   Review of the MIP images confirms the above findings.   IMPRESSION: Grossly stable 4.2 cm ascending thoracic aortic aneurysm. Recommend annual imaging followup by CTA or MRA. This recommendation follows 2010  ACCF/AHA/AATS/ACR/ASA/SCA/SCAI/SIR/STS/SVM Guidelines for the Diagnosis and Management of Patients with Thoracic Aortic Disease. Circulation. 2010; 121: Z733-z630. Aortic aneurysm NOS (ICD10-I71.9).   Aortic Atherosclerosis (ICD10-I70.0).     Electronically Signed   By: Lynwood Landy Raddle M.D.   On: 10/08/2021 14:40  CT angio chest 5/24  EXAM: CT ANGIOGRAPHY CHEST WITH CONTRAST   TECHNIQUE: Multidetector CT imaging of the chest was performed using the standard protocol during bolus administration of intravenous contrast. Multiplanar CT image reconstructions and MIPs were obtained to evaluate the vascular anatomy.   RADIATION DOSE REDUCTION: This exam was performed according to the departmental dose-optimization program which includes automated exposure control, adjustment of the mA and/or kV according to patient size and/or use of iterative reconstruction technique.   CONTRAST:  75mL ISOVUE -370 IOPAMIDOL  (ISOVUE -370) INJECTION 76%   COMPARISON:  10/08/2021   FINDINGS: Cardiovascular: The thoracic aorta is normal in course. There is stable dilation of the thoracic aorta measuring 4.2 cm in diameter in its ascending segment, 3.9 cm in diameter in its proximal descending segment, and 3.2 cm in diameter in its distal descending segment at the level of the left atrium. No superimposed intramural hematoma or dissection. Mild atherosclerotic calcification. Arch vasculature demonstrates classic anatomic configuration and is widely patent proximally.   Extensive multi-vessel coronary artery calcification. Mild global cardiomegaly, stable since prior examination. No pericardial effusion. Central pulmonary arteries are enlarged in keeping with changes of pulmonary arterial hypertension, stable since prior examination.   Mediastinum/Nodes: Multiple tiny hypoenhancing nodules are seen within the right thyroid  lobe, unlikely of clinical significance. No follow-up imaging is  recommended for these lesions. The esophagus is unremarkable. Small hiatal hernia. No pathologic thoracic adenopathy.   Lungs/Pleura: Mild bibasilar atelectasis. Lungs are otherwise clear. No pneumothorax or pleural effusion.   Upper Abdomen: Stable cyst within the left hepatic lobe. Cholecystectomy has been performed. No acute abnormality within the visualized upper abdomen.   Musculoskeletal: Osseous structures are age-appropriate. No acute bone abnormality.   Review of the MIP images confirms the above findings.   IMPRESSION: 1. Stable dilation of the thoracic aorta measuring 4.2 cm in diameter in its ascending segment. No superimposed intramural hematoma or dissection. Recommend annual imaging followup by CTA or MRA. This recommendation follows 2010 ACCF/AHA/AATS/ACR/ASA/SCA/SCAI/SIR/STS/SVM Guidelines for the Diagnosis and Management of Patients with Thoracic Aortic Disease. Circulation. 2010; 121: [id number]. Aortic aneurysm NOS (ICD10-I71.9) 2. Extensive multi-vessel coronary artery calcification. Mild global cardiomegaly. 3. Morphologic changes in keeping with pulmonary arterial hypertension, stable since prior examination. 4. Small hiatal hernia.   Aortic Atherosclerosis (ICD10-I70.0).     Electronically Signed   By: Dorethia Molt M.D.   On: 10/19/2022 21:01  Echocardiogram 03/28/2023 IMPRESSIONS     1. Left ventricular ejection fraction, by estimation, is 60 to 65%. Left  ventricular ejection fraction by 3D volume is 61 %. The left ventricle has  normal function. The left ventricle has no regional wall motion  abnormalities. There is moderate  concentric left ventricular hypertrophy. Left ventricular diastolic  parameters are consistent with Grade II diastolic dysfunction  (pseudonormalization). Elevated left atrial pressure.   2. Right ventricular systolic function is normal. The right ventricular  size is normal. There is normal pulmonary artery systolic  pressure. The  estimated right ventricular systolic pressure is 23.6 mmHg.   3. Left atrial size was mildly dilated.   4. The mitral valve is normal in structure. Trivial mitral valve  regurgitation.   5. The aortic valve is tricuspid. Aortic valve regurgitation is trivial.   6. Aortic dilatation noted. There is mild dilatation of the aortic root,  measuring 40 mm. There is moderate dilatation of the ascending aorta,  measuring 47 mm.   Comparison(s): Prior images reviewed side by side. The left ventricular  function is unchanged. The left ventricular diastolic function is  unchanged. The ascending aorta appears to be more dilated. Recommend  confirmation with CT or MRI.   FINDINGS   Left Ventricle: Left ventricular ejection fraction, by estimation, is 60  to 65%. Left ventricular ejection fraction by 3D volume is 61 %. The left  ventricle has normal function. The left ventricle has no regional wall  motion abnormalities. The left  ventricular internal cavity size was normal in size. There is moderate  concentric left ventricular hypertrophy. Left ventricular diastolic  parameters are consistent with Grade II diastolic dysfunction  (pseudonormalization). Elevated left atrial pressure.   Right Ventricle: The right ventricular size is normal. Right vetricular  wall thickness was not well visualized. Right ventricular systolic  function is normal. There is normal pulmonary artery systolic pressure.  The tricuspid regurgitant velocity is 2.27  m/s, and with an assumed right atrial pressure of 3 mmHg, the estimated  right ventricular systolic pressure is 23.6 mmHg.   Left Atrium: Left atrial size was mildly dilated.   Right Atrium: Right atrial size was normal in size.   Pericardium: There is no evidence of pericardial effusion.   Mitral Valve: The mitral valve is normal in structure. Trivial mitral  valve regurgitation.   Tricuspid Valve: The tricuspid valve is normal in  structure. Tricuspid  valve regurgitation is trivial.   Aortic Valve: The aortic valve is tricuspid. Aortic valve regurgitation is  trivial.   Pulmonic Valve: The pulmonic valve was grossly normal. Pulmonic valve  regurgitation is trivial. No evidence of pulmonic stenosis.   Aorta: Aortic dilatation noted. There is mild dilatation of the aortic  root, measuring 40 mm. There is moderate dilatation of the ascending  aorta, measuring 47 mm.   Venous: The inferior vena cava was not well visualized.   IAS/Shunts: No atrial level shunt detected by color flow Doppler.    CT angio chest aorta 03/20/2023  IMPRESSION: 1. Aortic atherosclerosis with aneurysmal dilatation of the ascending aorta measuring 4.4 cm. Recommend annual imaging followup by CTA or MRA. This recommendation follows 2010 ACCF/AHA/AATS/ACR/ASA/SCA/SCAI/SIR/STS/SVM Guidelines for the Diagnosis and Management of Patients with Thoracic Aortic Disease. Circulation. 2010; 121: [id number]. Aortic aneurysm NOS (ICD10-I71.9). 2. Distended pulmonary trunk suggesting underlying pulmonary artery hypertension. 3. Cardiomegaly with small pericardial effusion and coronary artery calcifications. 4. Small hiatal hernia.     Electronically Signed   By: Leita Birmingham M.D.   On: 03/26/2023 01:39  Assessment & Plan   1.  Atrial fibrillation-Notes increased fatigue.  He attributes this in part due to poor sleep habits.  He notes that he would not want to have CPAP or oral appliance for OSA.  We reviewed risk for stroke.  He agrees to take DOAC. Start Eliquis  5 mg twice daily Avoid triggers caffeine, chocolate, EtOH, dehydration excetra. Will plan for DCCV at follow-up in 3-4 weeks.  Thoracic aortic aneurysm-continues with well-controlled blood pressure.  Chest CT 03/20/2023 showed ascending aorta measuring 4.4 cm CT angio chest 10/08/2021 showed stable aneurysm.  5/24 CT angio chest showed 4.2 cm unchanged thoracic aortic  aneurysm. Maintain good blood pressure control. Wishes to defer CT angio chest aorta- does not want to have surgery  Essential hypertension-BP today 118/58 Continue amlodipine , doxazosin , losartan  Heart healthy low-sodium diet-reviewed Increase physical activity as tolerated  Coronary artery disease-stable.  Denies recent exertional chest discomfort.  He underwent stress testing 5/21 which showed EF of 62% no ischemia. Continue aspirin , rosuvastatin , amlodipine  Maintain physical activity  Hyperlipidemia-LDL 24 on 10/16/23 Continue rosuvastatin , aspirin  Heart healthy low-sodium high-fiber diet   Fatigue, diastolic dysfunction-Notes increased fatigue and continues to care for his wife.SABRA  Continues with generalized lower extremity swelling.  Repeat echocardiogram Heart healthy low-sodium diet-reviewed Elevate lower extremities when not active-encouraged lower extremity support stockings Repeat echocardiogram when clinically indicated  Disposition: Follow-up with Dr. Pietro or me in 3-4 weeks   Josefa HERO. Nabor Thomann NP-C     05/14/2024, 8:42 AM [facility] [address] Office [phone] Fax [phone]  Notice: This dictation was prepared with Dragon dictation along with smaller phrase technology. Any transcriptional errors that result from this process are unintentional and may not be corrected upon review.  I spent 14*** minutes examining this patient, reviewing medications, and using patient centered shared decision making involving her cardiac care.  Prior to her visit I spent greater than 20 minutes reviewing her past medical history,  medications, and prior cardiac tests.

## 2024-05-17 ENCOUNTER — Encounter: Payer: Self-pay | Admitting: General Practice

## 2024-05-17 ENCOUNTER — Ambulatory Visit: Attending: General Practice | Admitting: General Practice

## 2024-05-17 VITALS — BP 98/56 | HR 76 | Ht 70.0 in | Wt 250.0 lb

## 2024-05-17 DIAGNOSIS — R5383 Other fatigue: Secondary | ICD-10-CM | POA: Diagnosis not present

## 2024-05-17 DIAGNOSIS — I4891 Unspecified atrial fibrillation: Secondary | ICD-10-CM | POA: Diagnosis not present

## 2024-05-17 DIAGNOSIS — I2510 Atherosclerotic heart disease of native coronary artery without angina pectoris: Secondary | ICD-10-CM

## 2024-05-17 DIAGNOSIS — I10 Essential (primary) hypertension: Secondary | ICD-10-CM | POA: Diagnosis not present

## 2024-05-17 DIAGNOSIS — I5030 Unspecified diastolic (congestive) heart failure: Secondary | ICD-10-CM | POA: Diagnosis not present

## 2024-05-17 DIAGNOSIS — E785 Hyperlipidemia, unspecified: Secondary | ICD-10-CM

## 2024-05-17 MED ORDER — DILTIAZEM HCL ER COATED BEADS 120 MG PO CP24: 120.0000 mg | ORAL_CAPSULE | Freq: Every day | ORAL | 1 refills | Status: DC

## 2024-05-17 NOTE — Patient Instructions (Signed)
 Medication Instructions:  STOP Amlodipine  5 mg  START Diltiazem 120 mg take 1 tablet daily  *If you need a refill on your cardiac medications before your next appointment, please call your pharmacy*  Lab Work: NONE ORDERED If you have labs (blood work) drawn today and your tests are completely normal, you will receive your results only by: MyChart Message (if you have MyChart) OR A paper copy in the mail If you have any lab test that is abnormal or we need to change your treatment, we will call you to review the results.  Testing/Procedures: NONE ORDERED  Follow-Up:  At [facility], you and your health needs are our priority.  As part of our continuing mission to provide you with exceptional heart care, our providers are all part of one team.  This team includes your primary Cardiologist (physician) and Advanced Practice Providers or APPs (Physician Assistants and Nurse Practitioners) who all work together to provide you with the care you need, when you need it.  Your next appointment:   2-3 month(s)  Provider:   Redell Shallow, MD or Josefa Beauvais, NP   Follow in 1-2 weeks with [facility]  We recommend signing up for the patient portal called MyChart.  Sign up information is provided on this After Visit Summary.  MyChart is used to connect with patients for Virtual Visits (Telemedicine).  Patients are able to view lab/test results, encounter notes, upcoming appointments, etc.  Non-urgent messages can be sent to your provider as well.   To learn more about what you can do with MyChart, go to [email or web address].   Other Instructions Increase hydration (drink at least 3 bottles of water  a day)

## 2024-05-29 ENCOUNTER — Other Ambulatory Visit: Payer: Self-pay | Admitting: Family Medicine

## 2024-06-04 ENCOUNTER — Ambulatory Visit (HOSPITAL_COMMUNITY)
Admission: RE | Admit: 2024-06-04 | Discharge: 2024-06-04 | Disposition: A | Discharge status: Home / Self Care | Source: Ambulatory Visit | Attending: Physician Assistant | Admitting: Physician Assistant

## 2024-06-04 VITALS — BP 142/68 | HR 47 | Ht 70.0 in | Wt 249.2 lb

## 2024-06-04 DIAGNOSIS — I4891 Unspecified atrial fibrillation: Secondary | ICD-10-CM

## 2024-06-04 DIAGNOSIS — D6869 Other thrombophilia: Secondary | ICD-10-CM | POA: Diagnosis not present

## 2024-06-04 DIAGNOSIS — I4819 Other persistent atrial fibrillation: Secondary | ICD-10-CM | POA: Diagnosis not present

## 2024-06-04 DIAGNOSIS — I10 Essential (primary) hypertension: Secondary | ICD-10-CM

## 2024-06-04 MED ORDER — AMLODIPINE BESYLATE 5 MG PO TABS: 5.0000 mg | ORAL_TABLET | Freq: Every day | ORAL | 4 refills | Status: DC

## 2024-06-04 NOTE — Patient Instructions (Signed)
Stop cardizem. Start amlodipine 5mg  once a day.

## 2024-06-04 NOTE — Progress Notes (Addendum)
 "   Primary Care Physician: Micheal Wolm ORN, MD Primary Cardiologist: Redell Shallow, MD Electrophysiologist: None  Referring Physician: Josefa Beauvais NP   Isaiah Wilson is a 89 y.o. male with a history of HLD, HTN, thoracic aortic aneurysm, CAD, atrial fibrillation who presents for follow up in the [facility].  The patient was initially diagnosed with atrial fibrillation 04/09/24 after presenting for follow up with Josefa Beauvais. Patient was started on Eliquis  for stroke prevention and underwent DCCV on 04/23/24. He had quick return of afib but did feel more energetic in SR. He was started on diltiazem  for rate control.    Patient presents today for follow up for atrial fibrillation. He is suprisingly in SR today. He does still continue to feel fatigued. No bleeding issues on anticoagulation. He does admit to snoring.   Today, he denies symptoms of palpitations, chest pain, shortness of breath, orthopnea, PND, lower extremity edema, dizziness, presyncope, syncope, bleeding, or neurologic sequela. The patient is tolerating medications without difficulties and is otherwise without complaint today.    Atrial Fibrillation Risk Factors:  he does have symptoms or diagnosis of sleep apnea. Patient previously declined evaluation.  he does not have a history of rheumatic fever.   Atrial Fibrillation Management history:  Previous antiarrhythmic drugs: none Previous cardioversions: 04/23/24 Previous ablations: none Anticoagulation history: Eliquis   ROS- All systems are reviewed and negative except as per the HPI above.  Past Medical History:  Diagnosis Date   Abnormal EKG    hx left anterior fasicular block on old ekg 2017   Allergic rhinitis    Allergy    Barrett's esophagus    Benign prostatic hypertrophy    Colitis, ischemic    X 2   Complication of anesthesia    SLOW TO AWAKEN AFTER 1 SURGERY 20  YRS AGO   DJD (degenerative joint disease)     SPINE, AND OA   GERD (gastroesophageal reflux disease)    H/O hiatal hernia    HOH (hard of hearing)    BOTH EARS   Hyperlipidemia    no medication per pt.   Hypertension    pt denies says cardura  for bladder   Neuropathy     LEFT HAND AND ARM NUMB ALL THE TIME   Overweight(278.02)     Current Outpatient Medications  Medication Sig Dispense Refill   acetaminophen  (TYLENOL ) 500 MG tablet Take 1,500 mg by mouth 2 (two) times daily.     amLODipine  (NORVASC ) 5 MG tablet Take 1 tablet (5 mg total) by mouth daily. 30 tablet 4   apixaban  (ELIQUIS ) 5 MG TABS tablet Take 1 tablet (5 mg total) by mouth 2 (two) times daily. 180 tablet 1   cetirizine (ZYRTEC) 10 MG tablet Take 10 mg by mouth daily.     diphenhydrAMINE  HCl, Sleep, (SLEEP AID) 50 MG/30ML LIQD Take by mouth. With Benadryl - 30 mls at night (Patient taking differently: With Benadryl - 30 mls at night)     doxazosin  (CARDURA ) 8 MG tablet TAKE 1 TABLET BY MOUTH EVERY NIGHT AT BEDTIME 90 tablet 1   fluticasone  (FLONASE ) 50 MCG/ACT nasal spray Place 2 sprays into both nostrils 2 (two) times daily.     gabapentin  (NEURONTIN ) 100 MG capsule TAKE 1 CAPSULE BY MOUTH AT BEDTIME 90 capsule 3   losartan  (COZAAR ) 100 MG tablet TAKE 1 TABLET BY MOUTH DAILY 90 tablet 1   montelukast  (SINGULAIR ) 10 MG tablet TAKE 1 TABLET BY MOUTH EVERY NIGHT AT BEDTIME 90  tablet 1   Multiple Vitamins-Minerals (MULTIVITAMIN WITH MINERALS) tablet Take 1 tablet by mouth daily.     olopatadine (PATANOL) 0.1 % ophthalmic solution Place 1 drop into both eyes daily as needed for allergies. (Patient taking differently: Place 1 drop into both eyes as needed for allergies.)     rosuvastatin  (CRESTOR ) 20 MG tablet TAKE 1 TABLET BY MOUTH DAILY 90 tablet 3   zinc  gluconate 50 MG tablet Take 50 mg by mouth daily.     No current facility-administered medications for this encounter.    Physical Exam: BP (!) 142/68   Pulse (!) 47   Ht 5' 10 (1.778 m)   Wt 113 kg   BMI 35.76  kg/m   GEN: Well nourished, well developed in no acute distress CARDIAC: Regular rate and rhythm, no murmurs, rubs, gallops RESPIRATORY:  Clear to auscultation without rales, wheezing or rhonchi  ABDOMEN: Soft, non-tender, non-distended EXTREMITIES:  No edema; No deformity   Wt Readings from Last 3 Encounters:  06/04/24 113 kg  05/17/24 113.4 kg  05/01/24 113.4 kg     EKG Interpretation Date/Time:  Tuesday June 04 2024 14:06:49 EST Ventricular Rate:  47 PR Interval:  190 QRS Duration:  102 QT Interval:  468 QTC Calculation: 414 R Axis:   -63  Text Interpretation: Sinus bradycardia Left anterior fascicular block Cannot rule out Inferior infarct (cited on or before 09-Apr-2024) Possible Anterior infarct (cited on or before 09-Apr-2024) Abnormal ECG When compared with ECG of 17-May-2024 14:04, Sinus rhythm has replaced Atrial fibrillation Confirmed by Junah Yam ([id number]) on 06/04/2024 2:48:43 PM    Echo 03/28/23 demonstrated   1. Left ventricular ejection fraction, by estimation, is 60 to 65%. Left  ventricular ejection fraction by 3D volume is 61 %. The left ventricle has  normal function. The left ventricle has no regional wall motion  abnormalities. There is moderate concentric left ventricular hypertrophy. Left ventricular diastolic parameters are consistent with Grade II diastolic dysfunction (pseudonormalization). Elevated left atrial pressure.   2. Right ventricular systolic function is normal. The right ventricular  size is normal. There is normal pulmonary artery systolic pressure. The  estimated right ventricular systolic pressure is 23.6 mmHg.   3. Left atrial size was mildly dilated.   4. The mitral valve is normal in structure. Trivial mitral valve  regurgitation.   5. The aortic valve is tricuspid. Aortic valve regurgitation is trivial.   6. Aortic dilatation noted. There is mild dilatation of the aortic root,  measuring 40 mm. There is moderate dilatation of the  ascending aorta,  measuring 47 mm.   Comparison(s): Prior images reviewed side by side. The left ventricular  function is unchanged. The left ventricular diastolic function is  unchanged. The ascending aorta appears to be more dilated. Recommend  confirmation with CT or MRI.    CHA2DS2-VASc Score = 4  The patient's score is based upon: CHF History: 0 HTN History: 1 Diabetes History: 0 Stroke History: 0 Vascular Disease History: 1 Age Score: 2 Gender Score: 0       ASSESSMENT AND PLAN: Persistent Atrial Fibrillation (ICD10:  I48.19) The patient's CHA2DS2-VASc score is 4, indicating a 4.8% annual risk of stroke.   Patient spontaneously converted to SR but bradycardic. Suspect bradycardia contributing to fatigue. Will change diltiazem  back to amlodipine  5 mg daily. May have a component of tachybradycardia. Will have him wear a 2 week Zio monitor to assess arrhythmia burden.  For rhythm control, age likely prohibitive for ablation. Would avoid  class IC with h/o CAD. Can consider Multaq, dofetilide, or amiodarone. Patient would like to wait on further changes until monitor results.  Continue Eliquis  5 mg BID  Secondary Hypercoagulable State (ICD10:  D68.69) The patient is at significant risk for stroke/thromboembolism based upon his CHA2DS2-VASc Score of 4.  Continue Apixaban  (Eliquis ). No bleeding issues.   CAD No anginal symptoms Followed by Dr Pietro  HTN Stable. Stop diltiazem  and resume amlodipine  5 mg daily.  Obesity Body mass index is 35.76 kg/m.  Encouraged lifestyle modification    Follow up in the AF clinic in one month.     [facility] [address] [place], [place] [address] [phone] "

## 2024-06-11 ENCOUNTER — Telehealth: Payer: Self-pay | Admitting: Cardiology

## 2024-06-11 NOTE — Telephone Encounter (Signed)
 Spoke with pt.Pt feels like he can not get around. Feels dizziness, SOB and had some chest pain while making the bed that was relieved by sitting. This has started since Afib clinic changed him back to amlodipine . Gave pt 911/ED precautions and had a conversation about his disabled wife and how he needs to take care of himself in order to take care of her. Pt has someone coming over later to get his BP and HR and will call back with those readings. Advised I would send over to Josefa Beauvais to review.

## 2024-06-11 NOTE — Telephone Encounter (Signed)
 Pt states he was taking Amlodipine  but was advised to take Diltiazem  to slow HR, states that it was way too slow and was put back on amlodipine . Pt also states he is unable to walk 10 ft without being out of breath. Pt would like a c/b regarding this matter, please advise.

## 2024-06-11 NOTE — Telephone Encounter (Signed)
 Patient called to report per RN Spivie that his Oxygen was 93-95 and his HR was 53-55 this afternoon.

## 2024-06-12 NOTE — Telephone Encounter (Signed)
 Being addressed in another encounter. 06/11/24

## 2024-06-13 NOTE — Progress Notes (Unsigned)
 "   Cardiology Clinic Note   Patient Name: [name] Date of Encounter: 06/14/2024  Primary Care Provider:  Micheal Wolm ORN, MD Primary Cardiologist:  Redell Shallow, MD  Patient Profile    Isaiah Wilson 89 year old male presents the clinic today for follow-up evaluation of his atrial fibrillation and medication review.  Past Medical History    Past Medical History:  Diagnosis Date   Abnormal EKG    hx left anterior fasicular block on old ekg 2017   Allergic rhinitis    Allergy    Barrett's esophagus    Benign prostatic hypertrophy    Colitis, ischemic    X 2   Complication of anesthesia    SLOW TO AWAKEN AFTER 1 SURGERY 20  YRS AGO   DJD (degenerative joint disease)    SPINE, AND OA   GERD (gastroesophageal reflux disease)    H/O hiatal hernia    HOH (hard of hearing)    BOTH EARS   Hyperlipidemia    no medication per pt.   Hypertension    pt denies says cardura  for bladder   Neuropathy     LEFT HAND AND ARM NUMB ALL THE TIME   Overweight(278.02)    Past Surgical History:  Procedure Laterality Date   2 neck surgeries  2005-2006   Dr. Fate cx decompression, diskectomy,fusion C6-7 allograftWITH PLATE   APPENDECTOMY     CARDIOVERSION N/A 04/23/2024   Procedure: CARDIOVERSION;  Surgeon: Raford Riggs, MD;  Location: [facility] INVASIVE CV LAB;  Service: Cardiovascular;  Laterality: N/A;   EYE SURGERY     bilateral cataract surgerywith lens implant   FOOT SURGERY     right x 3   INNER EAR SURGERY Right    07/2020   LAPAROSCOPIC CHOLECYSTECTOMY  1993   Dr. MYRTIS Salt   left total knee replacement  07/2007   Dr. erskine    right total knee replacement  2012   Dr. Hiram REPINE  1975, May 2013   x3   TONSILLECTOMY  as child   TOTAL HIP ARTHROPLASTY Left 04/18/2018   Procedure: LEFT TOTAL HIP ARTHROPLASTY ANTERIOR APPROACH;  Surgeon: Melodi Lerner, MD;  Location: [facility] ORS;  Service: Orthopedics;  Laterality: Left;   TOTAL HIP ARTHROPLASTY  Right 03/13/2019   Procedure: TOTAL HIP ARTHROPLASTY ANTERIOR APPROACH;  Surgeon: Melodi Lerner, MD;  Location: [facility];  Service: Orthopedics;  Laterality: Right;    TOTAL KNEE REVISION Left 11/11/2015   Procedure: LEFT KNEE POLYETHELENE REVISION;  Surgeon: Lerner Melodi, MD;  Location: [facility];  Service: Orthopedics;  Laterality: Left;   TOTAL KNEE REVISION Left 12/07/2016   Procedure: LEFT TOTAL KNEE REVISION;  Surgeon: Melodi Lerner, MD;  Location: [facility];  Service: Orthopedics;  Laterality: Left;  Adductor Block   TRANSESOPHAGEAL ECHOCARDIOGRAM (CATH LAB) N/A 04/23/2024   Procedure: TRANSESOPHAGEAL ECHOCARDIOGRAM;  Surgeon: Raford Riggs, MD;  Location: [facility] INVASIVE CV LAB;  Service: Cardiovascular;  Laterality: N/A;    Allergies  Allergies  Allergen Reactions   Mixed Grasses     And trees    History of Present Illness    Isaiah Wilson has a PMH of HLD, HTN, thoracic aortic aneurysm, and coronary artery disease.  He had a brain MRI 3/21 which showed moderate chronic microvascular ischemic changes.  His echocardiogram 5/21 showed normal LV function, severe LVH, G2 DD, left atrial enlargement, mild aortic insufficiency, and dilated ascending aorta measuring 40 felt more millimeters.  He underwent nuclear stress test  5/21 which showed an EF of 62% no ischemia.  He had a CTA 5/22 which showed a stable aortic aneurysm measuring 4.2 cm and atherosclerosis including his coronary arteries and aorta.  He had repeat CT angio chest 10/08/2021 which showed stable ascending aortic aneurysm measuring 4.2 cm.  CT angio chest aorta 03/20/2023 showed ascending aorta measuring 4.4 cm.  He was seen in follow-up by Dr. Pietro on 12/09/2020.  During that time reported no dyspnea or chest pain.  He did note dizziness with standing which should improve with sitting.  He felt his dizziness was related to his ear.  He presented to the clinic 12/06/21 for follow-up evaluation and stated he felt tired  d/t caring for his spouse.  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  He was somewhat limited in his physical activity due to back pain.  We reviewed his most recent CT and he expressed understanding.  His EKG showed sinus bradycardia with first-degree AV block 54 bpm.  We planned repeat CT angio chest in 1 year, contact Isabel to reach out about respite care, and planned follow-up in 1 year.  He presented to the clinic 03/03/23 for follow-up evaluation and stated he continued to be the primary caregiver for his wife.  He had noticed some lower extremity swelling.  He had not been able to wear lower extremity support stockings.  We reviewed the importance of low-sodium diet.  We reviewed his thoracic aortic aneurysm.  His blood pressure had been well-controlled.  I order a repeat CT angio chest aorta for May and repeat echocardiogram.  Follow-up in 12 months was planned.   He presented to the clinic 04/09/24 for follow-up evaluation and stated he  had increased stress recently related to his church.  He reported that his church was dividing.  He continued to care for his wife.  He noted that he occasionally had chest discomfort with increased physical activity.  He also reported some increased fatigue.  He attributed this to not being able to sleep well.  He noted that he previously was told that he may have sleep apnea.  He did not want to wear CPAP or wear oral appliance.  We reviewed his previous CT angio chest and aneurysm.  He did not wish to proceed with further diagnostic testing.  He reported that he would not want to undergo surgery for this.  His EKG  showed atrial fibrillation.  We reviewed CVA risk.  He expressed understanding.  I  started apixaban  5 mg twice daily.  I planned DCCV and follow-up after.  He underwent DCCV on 04/23/2024. He received 1 shock with 200 J and converted successfully to NSR.  He tolerated the procedure well.  He presented to the  clinic 05/17/24 for follow-up evaluation and states he noted that he was in normal rhythm for 2 days and then noted increased fatigue again.  He continued to have increased stress related to caring for his wife and reports that he is worried about his wife's uncle as well.    He reported that he stayed well-hydrated but when asked how much fluid he drinks a day he reported that he drank 1 bottle of water .  I discontinued his amlodipine  and started diltiazem  120 daily, had him increase his p.o. hydration, planned follow-up with A-fib clinic in the next few days and plan follow-up with general cardiology in 2 to 3 months.  He was seen by [facility]  PA-C in the A-fib clinic on 06/04/2024.  His prior DCCV was reviewed.  He reported being more energetic while in sinus rhythm.  He was in sinus rhythm during evaluation.  His heart rate was noted to be 47.  His blood pressure was 142/68.  It was felt that his fatigue was related to bradycardia.  His diltiazem  was stopped and he was started back on amlodipine  5 mg daily.  He was seen in follow-up by the A-fib clinic.  He was noted to have low heart rate on diltiazem  and was transition back to amlodipine .  He contacted nurse triage line on 06/11/2024.  At the time of the call on 06/13/2024 he denied dizziness.  ED precautions were reviewed.  He presents to the clinic today for evaluation.  He states since the weekend he has felt very ill.  He reports dizziness and fatigue.  We reviewed his previous visit with the atrial fibrillation clinic.  We reviewed his medication changes.  I will stop his amlodipine  and put him back on diltiazem  120 mg daily.  He continues to wear his cardiac event monitor.  I will have him keep his follow-up on 07/09/2024 and on 08/06/2024 for further review and evaluation.  He reports that he was only drinking about 2 bottles of water  per day.  I encouraged him to drink 3 bottles or more.  He expressed understanding.  Today he denies chest pain,  shortness of breath, lower extremity edema, fatigue, palpitations, melena, hematuria, hemoptysis, diaphoresis, weakness, presyncope, syncope, orthopnea, and PND.   Home Medications    Prior to Admission medications   Medication Sig Start Date End Date Taking? Authorizing Provider  acetaminophen  (TYLENOL ) 650 MG CR tablet Take 650 mg by mouth every 8 (eight) hours as needed for pain.    [provider]  amLODipine  (NORVASC ) 5 MG tablet TAKE ONE TABLET BY MOUTH DAILY 01/08/21   Pietro Redell RAMAN, MD  aspirin  EC 81 MG tablet Take 81 mg by mouth daily.    [provider]  azelastine  (ASTELIN ) 0.1 % nasal spray Place 1 spray into both nostrils 2 (two) times daily as needed (allergies.).     [provider]  clobetasol (TEMOVATE) 0.05 % external solution     [provider]  doxazosin  (CARDURA ) 8 MG tablet Take 1 tablet (8 mg total) by mouth at bedtime. 11/24/21   Burchette, Wolm ORN, MD  EPINEPHrine  0.3 mg/0.3 mL IJ SOAJ injection Inject 0.3 mg into the muscle as needed (allergic reaction).     [provider]  esomeprazole  (NEXIUM ) 40 MG capsule TAKE ONE CAPSULE BY MOUTH DAILY (TAKE ON AN EMPTY STOMACH 30 MINUTES PRIOR TO A MEAL) FOR BARRETT'S ESOPHAGUS 06/17/21   Abran Norleen SAILOR, MD  fluticasone  (FLONASE ) 50 MCG/ACT nasal spray Place 2 sprays into both nostrils daily.     [provider]  loratadine  (CLARITIN ) 10 MG tablet     [provider]  losartan  (COZAAR ) 100 MG tablet TAKE ONE TABLET BY MOUTH DAILY 08/31/21   Burchette, Wolm ORN, MD  montelukast  (SINGULAIR ) 10 MG tablet TAKE ONE TABLET BY MOUTH EVERY NIGHT AT BEDTIME 10/11/21   Burchette, Wolm ORN, MD  mupirocin ointment (BACTROBAN) 2 %  12/26/19   [provider]  rosuvastatin  (CRESTOR ) 40 MG tablet TAKE ONE-HALF TABLET BY MOUTH ONCE A DAY 02/20/21   [provider]  tobramycin-dexamethasone  ANTHONEY) ophthalmic solution Apply to eye. 12/26/19   [provider]   vancomycin  (VANCOCIN ) 1 g injection  [provider]  zinc  gluconate 50 MG tablet Take 50 mg by mouth daily.    [provider]    Family History    Family History  Problem Relation Age of Onset   Leukemia Mother    Heart disease Father    Heart disease Sister    Colon cancer Neg Hx    Stomach cancer Neg Hx    Rectal cancer Neg Hx    Esophageal cancer Neg Hx    He indicated that his mother is deceased. He indicated that his father is deceased. He indicated that his sister is alive. He indicated that the status of his neg hx is unknown. He indicated that the status of his other is unknown and reported the following: sibling alive.  Social History    Social History   Socioeconomic History   Marital status: Married    Spouse name: Not on file   Number of children: 2   Years of education: some college   Highest education level: Not on file  Occupational History   Occupation: retired  Tobacco Use   Smoking status: Never   Smokeless tobacco: Never  Vaping Use   Vaping status: Never Used  Substance and Sexual Activity   Alcohol use: No    Alcohol/week: 0.0 standard drinks of alcohol   Drug use: No   Sexual activity: Not on file  Other Topics Concern   Not on file  Social History Narrative   Married   HH 2   2 children; 3 granchildren; 2 great grandchildren   Social Drivers of Health   Tobacco Use: Low Risk (06/14/2024)   Patient History    Smoking Tobacco Use: Never    Smokeless Tobacco Use: Never    Passive Exposure: Not on file  Financial Resource Strain: Low Risk (10/11/2022)   Overall Financial Resource Strain (CARDIA)    Difficulty of Paying Living Expenses: Not hard at all  Food Insecurity: Low Risk (04/19/2024)   Received from [facility]   Epic    Within the past 12 months, you worried that your food would run out before you got money to buy more: Never true    Within the past 12 months, the food you bought just didn't last and you  didn't have money to get more. : Never true  Transportation Needs: No Transportation Needs (04/19/2024)   Received from [facility]    In the past 12 months, has lack of reliable transportation kept you from medical appointments, meetings, work or from getting things needed for daily living? : No  Physical Activity: Not on file  Stress: Not on file  Social Connections: Not on file  Intimate Partner Violence: Not on file  Depression (PHQ2-9): Low Risk (12/12/2023)   Depression (PHQ2-9)    PHQ-2 Score: 0  Alcohol Screen: Not on file  Housing: Low Risk (04/19/2024)   Received from [facility]   Epic    What is your living situation today?: I have a steady place to live    Think about the place you live. Do you have problems with any of the following? Choose all that apply:: None/None on this list  Utilities: Low Risk (04/19/2024)   Received from [facility]   Utilities    In the past 12 months has the electric, gas, oil, or water  company threatened to shut off services in your home? : No  Health Literacy: Not on file     Review of Systems  General:  No chills, fever, night sweats or weight changes.  Cardiovascular:  No chest pain, dyspnea on exertion, edema, orthopnea, palpitations, paroxysmal nocturnal dyspnea. Dermatological: No rash, lesions/masses Respiratory: No cough, dyspnea Urologic: No hematuria, dysuria Abdominal:   No nausea, vomiting, diarrhea, bright red blood per rectum, melena, or hematemesis Neurologic:  No visual changes, wkns, changes in mental status. All other systems reviewed and are otherwise negative except as noted above.  Physical Exam    VS:  BP 100/62 (BP Location: Left Arm, Patient Position: Sitting, Cuff Size: Large)   Pulse 80   Ht 5' 10 (1.778 m)   Wt 250 lb 6.4 oz (113.6 kg)   SpO2 96%   BMI 35.93 kg/m  , BMI Body mass index is 35.93 kg/m. GEN: Well nourished, well developed, in no acute distress. HEENT:  normal. Neck: Supple, no JVD, carotid bruits, or masses. Cardiac: Irregularly irregular, no murmurs, rubs, or gallops. No clubbing, cyanosis, generalized bilateral nonpitting lower extremity edema.  Radials/DP/PT 2+ and equal bilaterally.  Respiratory:  Respirations regular and unlabored, clear to auscultation bilaterally. GI: Soft, nontender, nondistended, BS + x 4. MS: no deformity or atrophy. Skin: warm and dry, no rash. Neuro:  Strength and sensation are intact. Psych: Normal affect.  Accessory Clinical Findings    Recent Labs: 10/16/2023: ALT 20 04/23/2024: BUN 20; Creatinine, Ser 1.30; Hemoglobin 13.6; Potassium 4.2; Sodium 143   Recent Lipid Panel    Component Value Date/Time   CHOL 108 10/16/2023 1506   CHOL 118 02/10/2021 1356   TRIG 245.0 (H) 10/16/2023 1506   HDL 35.10 (L) 10/16/2023 1506   HDL 39 (L) 02/10/2021 1356   CHOLHDL 3 10/16/2023 1506   VLDL 49.0 (H) 10/16/2023 1506   LDLCALC 24 10/16/2023 1506   LDLCALC 52 02/10/2021 1356   LDLDIRECT 101.0 11/16/2016 0737    ECG personally reviewed by me today-EKG Interpretation Date/Time:  Friday June 14 2024 13:50:39 EST Ventricular Rate:  80 PR Interval:    QRS Duration:  88 QT Interval:  374 QTC Calculation: 431 R Axis:   -78  Text Interpretation: Atrial flutter with variable A-V block Left axis deviation Low voltage QRS Confirmed by Emelia Hazy [id number]) on 06/14/2024 2:10:21 PM       EKG 12/06/2021  sinus bradycardia with first-degree AV block right superior axis deviation inferior infarct undetermined age is 54 bpm- No acute changes  Echocardiogram 10/07/2019 IMPRESSIONS     1. Left ventricular ejection fraction, by estimation, is 60 to 65%. The  left ventricle has normal function. The left ventricle has no regional  wall motion abnormalities. There is severe concentric left ventricular  hypertrophy. Left ventricular diastolic   parameters are consistent with Grade II diastolic dysfunction   (pseudonormalization). Elevated left ventricular end-diastolic pressure.   2. Right ventricular systolic function is normal. The right ventricular  size is normal. There is normal pulmonary artery systolic pressure. The  estimated right ventricular systolic pressure is 19.0 mmHg.   3. Left atrial size was mildly dilated.   4. The mitral valve is normal in structure. Trivial mitral valve  regurgitation. No evidence of mitral stenosis.   5. The aortic valve is tricuspid. Aortic valve regurgitation is mild.  Mild aortic valve sclerosis is present, with no evidence of aortic valve  stenosis.   6. Aortic dilatation noted. 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   7. The inferior vena cava is normal  in size with greater than 50%  respiratory variability, suggesting right atrial pressure of 3 mmHg.   FINDINGS   Left Ventricle: Left ventricular ejection fraction, by estimation, is 60  to 65%. The left ventricle has normal function. The left ventricle has no  regional wall motion abnormalities. The left ventricular internal cavity  size was normal in size. There is   severe concentric left ventricular hypertrophy. Left ventricular  diastolic parameters are consistent with Grade II diastolic dysfunction  (pseudonormalization). Elevated left ventricular end-diastolic pressure.   Right Ventricle: The right ventricular size is normal. No increase in  right ventricular wall thickness. Right ventricular systolic function is  normal. There is normal pulmonary artery systolic pressure. The tricuspid  regurgitant velocity is 2.00 m/s, and   with an assumed right atrial pressure of 3 mmHg, the estimated right  ventricular systolic pressure is 19.0 mmHg.   Left Atrium: Left atrial size was mildly dilated.   Right Atrium: Right atrial size was normal in size.   Pericardium: Trivial pericardial effusion is present. The pericardial   effusion is circumferential.   Mitral Valve: The mitral valve is normal in structure. Normal mobility of  the mitral valve leaflets. Trivial mitral valve regurgitation. No evidence  of mitral valve stenosis.   Tricuspid Valve: The tricuspid valve is normal in structure. Tricuspid  valve regurgitation is trivial. No evidence of tricuspid stenosis.   Aortic Valve: The aortic valve is tricuspid. Aortic valve regurgitation is  mild. Aortic regurgitation PHT measures 690 msec. Mild aortic valve  sclerosis is present, with no evidence of aortic valve stenosis.   Pulmonic Valve: The pulmonic valve was normal in structure. Pulmonic valve  regurgitation is trivial. No evidence of pulmonic stenosis.   Aorta: Aortic dilatation noted. There is mild dilatation of the ascending  aorta and of the aortic root measuring 44 mm.   Venous: The inferior vena cava is normal in size with greater than 50%  respiratory variability, suggesting right atrial pressure of 3 mmHg.   IAS/Shunts: No atrial level shunt detected by color flow Doppler.  CT angio chest aorta 10/08/2021  COMPARISON:  Oct 13, 2020.   FINDINGS: Cardiovascular: Grossly stable 4.2 cm ascending thoracic aortic aneurysm is noted. No dissection is noted. Great vessels are widely patent without significant stenosis. Stable cardiomegaly. No pericardial effusion. Atherosclerosis of thoracic aorta is noted. Mild coronary calcifications are noted.   Mediastinum/Nodes: No enlarged mediastinal, hilar, or axillary lymph nodes. Thyroid  gland, trachea, and esophagus demonstrate no significant findings.   Lungs/Pleura: Lungs are clear. No pleural effusion or pneumothorax.   Upper Abdomen: No acute abnormality.   Musculoskeletal: No chest wall abnormality. No acute or significant osseous findings.   Review of the MIP images confirms the above findings.   IMPRESSION: Grossly stable 4.2 cm ascending thoracic aortic aneurysm.  Recommend annual imaging followup by CTA or MRA. This recommendation follows 2010 ACCF/AHA/AATS/ACR/ASA/SCA/SCAI/SIR/STS/SVM Guidelines for the Diagnosis and Management of Patients with Thoracic Aortic Disease. Circulation. 2010; 121: Z733-z630. Aortic aneurysm NOS (ICD10-I71.9).   Aortic Atherosclerosis (ICD10-I70.0).     Electronically Signed   By: Lynwood Landy Raddle M.D.   On: 10/08/2021 14:40  CT angio chest 5/24  EXAM: CT ANGIOGRAPHY CHEST WITH CONTRAST   TECHNIQUE: Multidetector CT imaging of the chest was performed using the standard protocol during bolus administration of intravenous contrast. Multiplanar CT image reconstructions and MIPs were obtained to evaluate the vascular anatomy.   RADIATION DOSE REDUCTION: This exam was performed according to the departmental  dose-optimization program which includes automated exposure control, adjustment of the mA and/or kV according to patient size and/or use of iterative reconstruction technique.   CONTRAST:  75mL ISOVUE -370 IOPAMIDOL  (ISOVUE -370) INJECTION 76%   COMPARISON:  10/08/2021   FINDINGS: Cardiovascular: The thoracic aorta is normal in course. There is stable dilation of the thoracic aorta measuring 4.2 cm in diameter in its ascending segment, 3.9 cm in diameter in its proximal descending segment, and 3.2 cm in diameter in its distal descending segment at the level of the left atrium. No superimposed intramural hematoma or dissection. Mild atherosclerotic calcification. Arch vasculature demonstrates classic anatomic configuration and is widely patent proximally.   Extensive multi-vessel coronary artery calcification. Mild global cardiomegaly, stable since prior examination. No pericardial effusion. Central pulmonary arteries are enlarged in keeping with changes of pulmonary arterial hypertension, stable since prior examination.   Mediastinum/Nodes: Multiple tiny hypoenhancing nodules are seen within the right  thyroid  lobe, unlikely of clinical significance. No follow-up imaging is recommended for these lesions. The esophagus is unremarkable. Small hiatal hernia. No pathologic thoracic adenopathy.   Lungs/Pleura: Mild bibasilar atelectasis. Lungs are otherwise clear. No pneumothorax or pleural effusion.   Upper Abdomen: Stable cyst within the left hepatic lobe. Cholecystectomy has been performed. No acute abnormality within the visualized upper abdomen.   Musculoskeletal: Osseous structures are age-appropriate. No acute bone abnormality.   Review of the MIP images confirms the above findings.   IMPRESSION: 1. Stable dilation of the thoracic aorta measuring 4.2 cm in diameter in its ascending segment. No superimposed intramural hematoma or dissection. Recommend annual imaging followup by CTA or MRA. This recommendation follows 2010 ACCF/AHA/AATS/ACR/ASA/SCA/SCAI/SIR/STS/SVM Guidelines for the Diagnosis and Management of Patients with Thoracic Aortic Disease. Circulation. 2010; 121: [id number]. Aortic aneurysm NOS (ICD10-I71.9) 2. Extensive multi-vessel coronary artery calcification. Mild global cardiomegaly. 3. Morphologic changes in keeping with pulmonary arterial hypertension, stable since prior examination. 4. Small hiatal hernia.   Aortic Atherosclerosis (ICD10-I70.0).     Electronically Signed   By: Dorethia Molt M.D.   On: 10/19/2022 21:01  Echocardiogram 03/28/2023 IMPRESSIONS     1. Left ventricular ejection fraction, by estimation, is 60 to 65%. Left  ventricular ejection fraction by 3D volume is 61 %. The left ventricle has  normal function. The left ventricle has no regional wall motion  abnormalities. There is moderate  concentric left ventricular hypertrophy. Left ventricular diastolic  parameters are consistent with Grade II diastolic dysfunction  (pseudonormalization). Elevated left atrial pressure.   2. Right ventricular systolic function is normal. The right  ventricular  size is normal. There is normal pulmonary artery systolic pressure. The  estimated right ventricular systolic pressure is 23.6 mmHg.   3. Left atrial size was mildly dilated.   4. The mitral valve is normal in structure. Trivial mitral valve  regurgitation.   5. The aortic valve is tricuspid. Aortic valve regurgitation is trivial.   6. Aortic dilatation noted. There is mild dilatation of the aortic root,  measuring 40 mm. There is moderate dilatation of the ascending aorta,  measuring 47 mm.   Comparison(s): Prior images reviewed side by side. The left ventricular  function is unchanged. The left ventricular diastolic function is  unchanged. The ascending aorta appears to be more dilated. Recommend  confirmation with CT or MRI.   FINDINGS   Left Ventricle: Left ventricular ejection fraction, by estimation, is 60  to 65%. Left ventricular ejection fraction by 3D volume is 61 %. The left  ventricle has normal function. The  left ventricle has no regional wall  motion abnormalities. The left  ventricular internal cavity size was normal in size. There is moderate  concentric left ventricular hypertrophy. Left ventricular diastolic  parameters are consistent with Grade II diastolic dysfunction  (pseudonormalization). Elevated left atrial pressure.   Right Ventricle: The right ventricular size is normal. Right vetricular  wall thickness was not well visualized. Right ventricular systolic  function is normal. There is normal pulmonary artery systolic pressure.  The tricuspid regurgitant velocity is 2.27  m/s, and with an assumed right atrial pressure of 3 mmHg, the estimated  right ventricular systolic pressure is 23.6 mmHg.   Left Atrium: Left atrial size was mildly dilated.   Right Atrium: Right atrial size was normal in size.   Pericardium: There is no evidence of pericardial effusion.   Mitral Valve: The mitral valve is normal in structure. Trivial mitral  valve  regurgitation.   Tricuspid Valve: The tricuspid valve is normal in structure. Tricuspid  valve regurgitation is trivial.   Aortic Valve: The aortic valve is tricuspid. Aortic valve regurgitation is  trivial.   Pulmonic Valve: The pulmonic valve was grossly normal. Pulmonic valve  regurgitation is trivial. No evidence of pulmonic stenosis.   Aorta: Aortic dilatation noted. There is mild dilatation of the aortic  root, measuring 40 mm. There is moderate dilatation of the ascending  aorta, measuring 47 mm.   Venous: The inferior vena cava was not well visualized.   IAS/Shunts: No atrial level shunt detected by color flow Doppler.    CT angio chest aorta 03/20/2023  IMPRESSION: 1. Aortic atherosclerosis with aneurysmal dilatation of the ascending aorta measuring 4.4 cm. Recommend annual imaging followup by CTA or MRA. This recommendation follows 2010 ACCF/AHA/AATS/ACR/ASA/SCA/SCAI/SIR/STS/SVM Guidelines for the Diagnosis and Management of Patients with Thoracic Aortic Disease. Circulation. 2010; 121: Z733-z630. Aortic aneurysm NOS (ICD10-I71.9). 2. Distended pulmonary trunk suggesting underlying pulmonary artery hypertension. 3. Cardiomegaly with small pericardial effusion and coronary artery calcifications. 4. Small hiatal hernia.     Electronically Signed   By: Leita Birmingham M.D.   On: 03/26/2023 01:39  TEE 04/23/2024  IMPRESSIONS     1. Left ventricular ejection fraction, by estimation, is 60 to 65%. The  left ventricle has normal function. The left ventricle has no regional  wall motion abnormalities.   2. Right ventricular systolic function is normal. The right ventricular  size is normal.   3. Left atrial size was mildly dilated. No left atrial/left atrial  appendage thrombus was detected. The LAA emptying velocity was 93 cm/s.   4. The mitral valve is normal in structure. Trivial mitral valve  regurgitation. No evidence of mitral stenosis.   5. The aortic  valve is tricuspid. There is mild calcification of the  aortic valve. There is mild thickening of the aortic valve. Aortic valve  regurgitation is not visualized. No aortic stenosis is present.   6. Aortic dilatation noted. There is mild dilatation of the ascending  aorta, measuring 41 mm.   7. The inferior vena cava is normal in size with greater than 50%  respiratory variability, suggesting right atrial pressure of 3 mmHg.   FINDINGS   Left Ventricle: Left ventricular ejection fraction, by estimation, is 60  to 65%. The left ventricle has normal function. The left ventricle has no  regional wall motion abnormalities. The left ventricular internal cavity  size was normal in size. There is   no left ventricular hypertrophy.   Right Ventricle: The right ventricular size is  normal. No increase in  right ventricular wall thickness. Right ventricular systolic function is  normal.   Left Atrium: Left atrial size was mildly dilated. No left atrial/left  atrial appendage thrombus was detected. The LAA emptying velocity was 93  cm/s.   Right Atrium: Right atrial size was normal in size.   Pericardium: There is no evidence of pericardial effusion.   Mitral Valve: The mitral valve is normal in structure. Trivial mitral  valve regurgitation. No evidence of mitral valve stenosis.   Tricuspid Valve: The tricuspid valve is normal in structure. Tricuspid  valve regurgitation is trivial. No evidence of tricuspid stenosis.   Aortic Valve: The aortic valve is tricuspid. There is mild calcification  of the aortic valve. There is mild thickening of the aortic valve. Aortic  valve regurgitation is not visualized. No aortic stenosis is present.   Pulmonic Valve: The pulmonic valve was normal in structure. Pulmonic valve  regurgitation is trivial. No evidence of pulmonic stenosis.   Aorta: Aortic dilatation noted. There is mild dilatation of the ascending  aorta, measuring 41 mm.   Venous: The  inferior vena cava is normal in size with greater than 50%  respiratory variability, suggesting right atrial pressure of 3 mmHg.   IAS/Shunts: No atrial level shunt detected by color flow Doppler. Agitated  saline contrast was given intravenously to evaluate for intracardiac  shunting.   Assessment & Plan   1.  Atrial fibrillation-EKG today shows atrial flutter with variable AV block left axis deviation 80 bpm.   Previously noted fatigue returned after converting back to atrial fibrillation.  He followed up with A-fib clinic and was noted to be in sinus bradycardia.  His diltiazem  was discontinued and he was placed back on amlodipine .  CHA2DS2-VASc Score and unadjusted Ischemic Stroke Rate (% per year) is equal to 4.8% annual risk of stroke and score of 4.  [HTN, vascular disease, age x 2 ] Continue Eliquis  5 mg twice daily Avoid triggers caffeine, chocolate, EtOH, dehydration excetra. Start diltiazem  120 mg daily Follow-up with A-fib clinic Recommend 40-50+ ounces of fluid daily  Essential hypertension-BP today 100/62 Continue doxazosin , losartan , Stop amlodipine   Heart healthy low-sodium diet-reviewed Increase physical activity as tolerated  Coronary artery disease-today he denies recent exertional chest discomfort.  He underwent stress testing 5/21 which showed EF of 62% no ischemia. Continue aspirin , rosuvastatin , amlodipine  Maintain physical activity  Fatigue, diastolic dysfunction-EKG shows return to atrial flutter/fibrillation.  Continues with generalized lower extremity swelling, stable.   TEE 04/23/2024 showed LVEF of 60-65%, mildly dilated left atria and no left atrial appendage thrombus. Heart healthy low-sodium diet-reviewed Elevate lower extremities when not active-encouraged lower extremity support stockings   Disposition: Follow-up with Dr. Pietro or me in 2-3 months.  Follow-up with A-fib clinic in the next few weeks.  Josefa HERO. Mose Colaizzi NP-C     06/14/2024, 2:10  PM [facility] [address] Office [phone] Fax [phone]  Notice: This dictation was prepared with Dragon dictation along with smaller phrase technology. Any transcriptional errors that result from this process are unintentional and may not be corrected upon review.  I spent 14 minutes examining this patient, reviewing medications, and using patient centered shared decision making involving her cardiac care.  Prior to her visit I spent greater than 20 minutes reviewing her past medical history,  medications, and prior cardiac tests.  "

## 2024-06-13 NOTE — Telephone Encounter (Signed)
 Pt c/o medication issue:  1. Name of Medication:   amLODipine  (NORVASC ) 5 MG tablet   2. How are you currently taking this medication (dosage and times per day)?   Not currently taking  3. Are you having a reaction (difficulty breathing--STAT)?   4. What is your medication issue?   Patient stated he has been off this medication since Monday and wants advice on next steps.  Patient noted he is not able to receive MyChart messages.

## 2024-06-13 NOTE — Telephone Encounter (Signed)
 Spoke with patient regarding the Amlodipine , advised patient to take his meds as prescribed. Made an appt with Josefa for tomorrow to go over all of his medications and symptoms. Informed patient that looking at his last refill it was ordered by Clint in A-fib. Patient still would like to see a provider to go over his medications. States that he is dizzy a lot. Denies dizziness at this time. Went over ED protocol. Verbalized understanding.

## 2024-06-14 ENCOUNTER — Other Ambulatory Visit (HOSPITAL_COMMUNITY): Payer: Self-pay

## 2024-06-14 ENCOUNTER — Ambulatory Visit: Attending: General Practice | Admitting: General Practice

## 2024-06-14 ENCOUNTER — Encounter: Payer: Self-pay | Admitting: General Practice

## 2024-06-14 VITALS — BP 100/62 | HR 80 | Ht 70.0 in | Wt 250.4 lb

## 2024-06-14 DIAGNOSIS — I4891 Unspecified atrial fibrillation: Secondary | ICD-10-CM | POA: Diagnosis not present

## 2024-06-14 DIAGNOSIS — I2510 Atherosclerotic heart disease of native coronary artery without angina pectoris: Secondary | ICD-10-CM | POA: Diagnosis not present

## 2024-06-14 DIAGNOSIS — I10 Essential (primary) hypertension: Secondary | ICD-10-CM

## 2024-06-14 DIAGNOSIS — E785 Hyperlipidemia, unspecified: Secondary | ICD-10-CM

## 2024-06-14 DIAGNOSIS — I5030 Unspecified diastolic (congestive) heart failure: Secondary | ICD-10-CM

## 2024-06-14 MED ORDER — DILTIAZEM HCL ER COATED BEADS 120 MG PO CP24: 120.0000 mg | ORAL_CAPSULE | Freq: Every day | ORAL | 3 refills | Status: AC

## 2024-06-14 NOTE — Patient Instructions (Signed)
 Medication Instructions:  STOP AMLODIPINE   RESTART DILTIAZEM  120 MG DAILY *If you need a refill on your cardiac medications before your next appointment, please call your pharmacy*  Lab Work: NO LABS If you have labs (blood work) drawn today and your tests are completely normal, you will receive your results only by: MyChart Message (if you have MyChart) OR A paper copy in the mail If you have any lab test that is abnormal or we need to change your treatment, we will call you to review the results.  Testing/Procedures: NO TESTING  Follow-Up: At [facility], you and your health needs are our priority.  As part of our continuing mission to provide you with exceptional heart care, our providers are all part of one team.  This team includes your primary Cardiologist (physician) and Advanced Practice Providers or APPs (Physician Assistants and Nurse Practitioners) who all work together to provide you with the care you need, when you need it.  Your next appointment:   Isaiah Wilson MARCH 10TH 2026  Provider:   Redell Shallow, MD    We recommend signing up for the patient portal called MyChart.  Sign up information is provided on this After Visit Summary.  MyChart is used to connect with patients for Virtual Visits (Telemedicine).  Patients are able to view lab/test results, encounter notes, upcoming appointments, etc.  Non-urgent messages can be sent to your provider as well.   To learn more about what you can do with MyChart, go to [email or web address].   Other Instructions KEEP FOLLOW UP WITH AFIB CLINIC FEBRUARY 10TH 2026  INCREASE HYDRATION TO AT LEAST 3 BOTTLES OF WATER 

## 2024-06-28 ENCOUNTER — Ambulatory Visit (HOSPITAL_COMMUNITY): Payer: Self-pay | Admitting: Physician Assistant

## 2024-07-03 ENCOUNTER — Ambulatory Visit: Admitting: Podiatry

## 2024-07-03 ENCOUNTER — Encounter: Payer: Self-pay | Admitting: Podiatry

## 2024-07-03 DIAGNOSIS — M7752 Other enthesopathy of left foot: Secondary | ICD-10-CM

## 2024-07-03 DIAGNOSIS — B351 Tinea unguium: Secondary | ICD-10-CM

## 2024-07-03 DIAGNOSIS — D689 Coagulation defect, unspecified: Secondary | ICD-10-CM

## 2024-07-03 DIAGNOSIS — M7751 Other enthesopathy of right foot: Secondary | ICD-10-CM

## 2024-07-03 DIAGNOSIS — L84 Corns and callosities: Secondary | ICD-10-CM

## 2024-07-03 MED ORDER — TRIAMCINOLONE ACETONIDE 10 MG/ML IJ SUSP
10.0000 mg | Freq: Once | INTRAMUSCULAR | Status: AC
  Administered 2024-07-03: 10 mg via INTRA_ARTICULAR

## 2024-07-04 ENCOUNTER — Ambulatory Visit (HOSPITAL_COMMUNITY): Admitting: Physician Assistant

## 2024-07-04 NOTE — Progress Notes (Signed)
 Subjective:   Patient ID: Isaiah Wilson, male   DOB: 89 y.o.   MRN: [id number]   HPI Patient presents numerous issues with chronic keratotic lesion first metatarsal bilateral painful on blood thinner with A-fib pain of a chronic nature of the ankles bilateral and nails that are thick and impossible for him to cut with caregiver here who cannot provide this service   ROS      Objective:  Physical Exam  Neurovascular status found to be intact mild edema in the ankles consistent with exquisite discomfort in the sinus tarsi bilateral with chronic arthritis.  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

## 2024-07-09 ENCOUNTER — Ambulatory Visit (HOSPITAL_COMMUNITY): Admitting: Physician Assistant

## 2024-08-06 ENCOUNTER — Ambulatory Visit: Admitting: Cardiology

## 2024-09-11 ENCOUNTER — Ambulatory Visit: Admitting: Podiatry
